# Patient Record
Sex: Male | Born: 1970 | Race: White | Hispanic: No | Smoking: Never smoker
Health system: Southern US, Community
[De-identification: ages and names within clinical notes are randomized; demographics above are authoritative.]

## PROBLEM LIST (undated history)

## (undated) DIAGNOSIS — K8020 Calculus of gallbladder without cholecystitis without obstruction: Secondary | ICD-10-CM

## (undated) DIAGNOSIS — E119 Type 2 diabetes mellitus without complications: Secondary | ICD-10-CM

## (undated) DIAGNOSIS — M25511 Pain in right shoulder: Secondary | ICD-10-CM

## (undated) DIAGNOSIS — Z6841 Body Mass Index (BMI) 40.0 and over, adult: Secondary | ICD-10-CM

## (undated) DIAGNOSIS — K625 Hemorrhage of anus and rectum: Secondary | ICD-10-CM

## (undated) DIAGNOSIS — K760 Fatty (change of) liver, not elsewhere classified: Secondary | ICD-10-CM

## (undated) DIAGNOSIS — K219 Gastro-esophageal reflux disease without esophagitis: Secondary | ICD-10-CM

## (undated) DIAGNOSIS — I2510 Atherosclerotic heart disease of native coronary artery without angina pectoris: Secondary | ICD-10-CM

## (undated) DIAGNOSIS — I10 Essential (primary) hypertension: Secondary | ICD-10-CM

## (undated) DIAGNOSIS — C801 Malignant (primary) neoplasm, unspecified: Secondary | ICD-10-CM

## (undated) DIAGNOSIS — M25512 Pain in left shoulder: Secondary | ICD-10-CM

## (undated) DIAGNOSIS — F1010 Alcohol abuse, uncomplicated: Secondary | ICD-10-CM

## (undated) HISTORY — DX: Essential (primary) hypertension: I10

## (undated) HISTORY — DX: Type 2 diabetes mellitus without complications: E11.9

---

## 2012-07-15 DIAGNOSIS — F1010 Alcohol abuse, uncomplicated: Secondary | ICD-10-CM

## 2012-07-15 HISTORY — DX: Alcohol abuse, uncomplicated: F10.10

## 2013-03-15 DIAGNOSIS — E11628 Type 2 diabetes mellitus with other skin complications: Secondary | ICD-10-CM | POA: Insufficient documentation

## 2013-03-15 DIAGNOSIS — E119 Type 2 diabetes mellitus without complications: Secondary | ICD-10-CM

## 2013-03-15 HISTORY — DX: Type 2 diabetes mellitus without complications: E11.9

## 2013-03-15 HISTORY — DX: Morbid (severe) obesity due to excess calories: E66.01

## 2013-03-20 ENCOUNTER — Ambulatory Visit (INDEPENDENT_AMBULATORY_CARE_PROVIDER_SITE_OTHER): Payer: Self-pay | Admitting: Family Medicine

## 2013-03-20 ENCOUNTER — Emergency Department (HOSPITAL_COMMUNITY)
Admission: EM | Admit: 2013-03-20 | Discharge: 2013-03-20 | Discharge status: Against Medical Advice | Payer: Self-pay | Attending: Emergency Medicine | Admitting: Emergency Medicine

## 2013-03-20 ENCOUNTER — Emergency Department (HOSPITAL_COMMUNITY): Payer: Self-pay

## 2013-03-20 ENCOUNTER — Encounter (HOSPITAL_COMMUNITY): Payer: Self-pay

## 2013-03-20 ENCOUNTER — Encounter: Payer: Self-pay | Admitting: Family Medicine

## 2013-03-20 VITALS — BP 152/98 | HR 109 | Temp 97.9°F | Resp 20

## 2013-03-20 DIAGNOSIS — R61 Generalized hyperhidrosis: Secondary | ICD-10-CM | POA: Insufficient documentation

## 2013-03-20 DIAGNOSIS — R0609 Other forms of dyspnea: Secondary | ICD-10-CM | POA: Insufficient documentation

## 2013-03-20 DIAGNOSIS — R42 Dizziness and giddiness: Secondary | ICD-10-CM | POA: Insufficient documentation

## 2013-03-20 DIAGNOSIS — B354 Tinea corporis: Secondary | ICD-10-CM

## 2013-03-20 DIAGNOSIS — Z79899 Other long term (current) drug therapy: Secondary | ICD-10-CM | POA: Insufficient documentation

## 2013-03-20 DIAGNOSIS — K219 Gastro-esophageal reflux disease without esophagitis: Secondary | ICD-10-CM | POA: Insufficient documentation

## 2013-03-20 DIAGNOSIS — I4892 Unspecified atrial flutter: Secondary | ICD-10-CM

## 2013-03-20 DIAGNOSIS — R000 Tachycardia, unspecified: Secondary | ICD-10-CM | POA: Insufficient documentation

## 2013-03-20 DIAGNOSIS — R0989 Other specified symptoms and signs involving the circulatory and respiratory systems: Secondary | ICD-10-CM | POA: Insufficient documentation

## 2013-03-20 DIAGNOSIS — I10 Essential (primary) hypertension: Secondary | ICD-10-CM

## 2013-03-20 DIAGNOSIS — R079 Chest pain, unspecified: Secondary | ICD-10-CM | POA: Insufficient documentation

## 2013-03-20 HISTORY — DX: Gastro-esophageal reflux disease without esophagitis: K21.9

## 2013-03-20 LAB — CBC WITH DIFFERENTIAL/PLATELET
Basophils Absolute: 0 10*3/uL (ref 0.0–0.1)
Basophils Relative: 0 % (ref 0–1)
Eosinophils Relative: 1 % (ref 0–5)
HCT: 46.6 % (ref 39.0–52.0)
Hemoglobin: 16.4 g/dL (ref 13.0–17.0)
MCH: 32.3 pg (ref 26.0–34.0)
MCHC: 35.2 g/dL (ref 30.0–36.0)
MCV: 91.9 fL (ref 78.0–100.0)
Monocytes Absolute: 0.5 10*3/uL (ref 0.1–1.0)
Monocytes Relative: 6 % (ref 3–12)
Neutro Abs: 6.3 10*3/uL (ref 1.7–7.7)
RDW: 12.9 % (ref 11.5–15.5)

## 2013-03-20 LAB — POCT I-STAT TROPONIN I: Troponin i, poc: 0 ng/mL (ref 0.00–0.08)

## 2013-03-20 LAB — COMPREHENSIVE METABOLIC PANEL
Albumin: 3.8 g/dL (ref 3.5–5.2)
BUN: 8 mg/dL (ref 6–23)
Calcium: 9.3 mg/dL (ref 8.4–10.5)
GFR calc Af Amer: >90 mL/min (ref >90)
Glucose, Bld: 294 mg/dL — ABNORMAL HIGH (ref 70–99)
Total Protein: 7.3 g/dL (ref 6.0–8.3)

## 2013-03-20 LAB — D-DIMER, QUANTITATIVE: D-Dimer, Quant: <0.27 ug/mL-FEU (ref 0.00–0.48)

## 2013-03-20 MED ORDER — SODIUM CHLORIDE 0.9 % IV SOLN
INTRAVENOUS | Status: DC
  Administered 2013-03-20: 11:00:00 via INTRAVENOUS

## 2013-03-20 MED ORDER — KETOCONAZOLE 2 % EX CREA: TOPICAL_CREAM | Freq: Every day | CUTANEOUS | Status: DC

## 2013-03-20 MED ORDER — IOHEXOL 350 MG/ML SOLN
100.0000 mL | Freq: Once | INTRAVENOUS | Status: AC | PRN
  Administered 2013-03-20: 100 mL via INTRAVENOUS

## 2013-03-20 NOTE — ED Notes (Signed)
Pt presented to [facility] with c/o CP associated with diaphoresis.  Pt reports some SOB.  Pt describes pain as intermittent tightness to the left chest.  Pt took 325mg  Aspirin just prior to going to [facility].

## 2013-03-20 NOTE — ED Provider Notes (Addendum)
CSN: [id number]     Arrival date & time 03/20/13  4098 History   First MD Initiated Contact with Patient 03/20/13 [phone]     Chief Complaint  Patient presents with  . Chest Pain   (Consider location/radiation/quality/duration/timing/severity/associated sxs/prior Treatment) Patient is a 42 y.o. male presenting with chest pain. The history is provided by the patient.  Chest Pain  patient here complaining of left-sided sharp chest pain which began at rest this morning. He had associated dizziness and diaphoresis. Mild dyspnea as well 2. No recent fever or cough. No leg pain or swelling. No pleuritic component to this. Went to urgent care Center and was sent here for further evaluation. No prior history of same. Strong family history of coronary artery disease with family members in their 49s having MIs. He was given aspirin prior to arrival. EMS was called and he was transported here  Past Medical History  Diagnosis Date  . GERD (gastroesophageal reflux disease)    History reviewed. No pertinent past surgical history. No family history on file. History  Substance Use Topics  . Smoking status: Never Smoker   . Smokeless tobacco: Not on file     Comment: occasionally dips tobacco  . Alcohol Use: Yes     Comment: Drinks 16-24 ounces of Vodka daily.  Last drink last night.    Review of Systems  Cardiovascular: Positive for chest pain.  All other systems reviewed and are negative.    Allergies  Review of patient's allergies indicates no known allergies.  Home Medications   Current Outpatient Rx  Name  Route  Sig  Dispense  Refill  . ketoconazole (NIZORAL) 2 % cream   Topical   Apply topically daily.   15 g   0   . Multiple Vitamins-Minerals (MULTIVITAMIN WITH MINERALS) tablet   Oral   Take 1 tablet by mouth daily.         Marland Kitchen omeprazole (PRILOSEC) 10 MG capsule   Oral   Take 10 mg by mouth daily.          BP 153/83  Resp 18  Ht 6\' 3"  (1.905 m)  Wt 370 lb (167.831 kg)   BMI 46.25 kg/m2  SpO2 93% Physical Exam  Nursing note and vitals reviewed. Constitutional: He is oriented to person, place, and time. He appears well-developed and well-nourished.  Non-toxic appearance. No distress.  HENT:  Head: Normocephalic and atraumatic.  Eyes: Conjunctivae, EOM and lids are normal. Pupils are equal, round, and reactive to light.  Neck: Normal range of motion. Neck supple. No tracheal deviation present. No mass present.  Cardiovascular: Regular rhythm and normal heart sounds.  Tachycardia present.  Exam reveals no gallop.   No murmur heard. Pulmonary/Chest: Effort normal and breath sounds normal. No stridor. No respiratory distress. He has no decreased breath sounds. He has no wheezes. He has no rhonchi. He has no rales.  Abdominal: Soft. Normal appearance and bowel sounds are normal. He exhibits no distension. There is no tenderness. There is no rebound and no CVA tenderness.  Musculoskeletal: Normal range of motion. He exhibits no edema and no tenderness.  Neurological: He is alert and oriented to person, place, and time. He has normal strength. No cranial nerve deficit or sensory deficit. GCS eye subscore is 4. GCS verbal subscore is 5. GCS motor subscore is 6.  Skin: Skin is warm and dry. No abrasion and no rash noted.  Psychiatric: He has a normal mood and affect. His speech is normal and  behavior is normal.    ED Course  Procedures (including critical care time) Labs Review Labs Reviewed  CBC WITH DIFFERENTIAL  COMPREHENSIVE METABOLIC PANEL   Imaging Review No results found.  MDM  No diagnosis found.  Date: 03/20/2013  Rate: 98  Rhythm: normal sinus rhythm  QRS Axis: normal  Intervals: normal  ST/T Wave abnormalities: nonspecific ST changes  Conduction Disutrbances:left bundle branch block  Narrative Interpretation:   Old EKG Reviewed: none available   11:40 AM Patient given IV fluids here. Troponin and d-dimer negative. Will be admitted for  ACS workup  12:39 PM Patient seen by admitting team and he has refused admission. Risk of sudden cardiac death explained to him and he will sign out AMA  Toy Baker, MD 03/20/13 1140  Toy Baker, MD 03/20/13 1240

## 2013-03-20 NOTE — Progress Notes (Signed)
Is a 42 year old single man who comes in with his father because of chest pain. 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  Patient developed chest pain about 7:30 this morning. He describes it as a fluctuating substernal pressure with some left arm paresthesias. He denies any shortness of breath but he does have diaphoresis. Patient took one 325 mg aspirin. Patient has positive family history for atrial flutter.  Objective: Patient is in no acute distress but he is mildly diaphoretic and obviously concerned. HEENT: Unremarkable Neck: Supple no adenopathy or JVD, no thyromegaly Chest: Clear to auscultation Heart: Rapid at about 110 beats per minute, no murmur, no gallop Abdomen: Soft nontender Extremities: No tenderness of the calves, 1-2+ pedal edema present Skin: Multiple annular abdominal erythematous lesions with central clearing and sharp border on the diameter. EKG: Atrial flutter without acute ST changes. Assessment: Hypertensive, morbidly obese individual with new-onset of atrial flutter requiring further investigation for cardiac disease.  Plan: IV to KVO, oxygen. Ambulance called and transfer him and to: Hospital.  Signed, Sheila Oats.D.

## 2013-03-20 NOTE — ED Notes (Signed)
Internal Medicine at bedside.  

## 2013-03-20 NOTE — ED Notes (Signed)
Pt has ride home with family. Educated on risks of leaving AMA

## 2013-03-20 NOTE — Patient Instructions (Addendum)
s a 42 year old single man who comes in with his father because of chest pain. 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  Patient developed chest pain about 7:30 this morning. He describes it as a fluctuating substernal pressure with some left arm paresthesias. He denies any shortness of breath but he does have diaphoresis. Patient took one 325 mg aspirin. Patient has positive family history for atrial flutter.  Objective: Patient is in no acute distress but he is mildly diaphoretic and obviously concerned. HEENT: Unremarkable Neck: Supple no adenopathy or JVD, no thyromegaly Chest: Clear to auscultation Heart: Rapid at about 110 beats per minute, no murmur, no gallop Abdomen: Soft nontender Extremities: No tenderness of the calves, 1-2+ pedal edema present Skin: Multiple annular abdominal erythematous lesions with central clearing and sharp border on the diameter. EKG: Atrial flutter without acute ST changes. Assessment: Hypertensive, morbidly obese individual with new-onset of atrial flutter requiring further investigation for cardiac disease.  Plan: IV to KVO, oxygen. Ambulance called and transfer him and to: Hospital.  Signed, Sheila Oats.D.

## 2013-03-20 NOTE — ED Notes (Signed)
Pt refuses to use urinal and insists on walking to restroom.  Pt ambulated independently and without difficulty to restroom.  Pt updated on status and advised that Internal Med had been consulted.  Pt stated he wants to go home.

## 2013-03-20 NOTE — ED Notes (Signed)
Pt given another pillow. 

## 2013-03-20 NOTE — ED Notes (Signed)
Pt has returned from being out of the department; pt placed back on monitor, continuous pulse oximetry and blood pressure cuff 

## 2013-03-20 NOTE — H&P (Signed)
Date: 03/20/2013               Patient Name:  Chad [name] MRN: [id number]  DOB: 1970-08-16 Age / Sex: 42 y.o., male   PCP: No Pcp Per Patient         Medical Service: Internal Medicine Teaching Service         Attending Physician: Dr. Toy Baker, MD    First Contact: Dr. Delane Ginger Pager: [phone]  Second Contact: Dr. Dorise Hiss Pager: [phone]       After Hours (After 5p/  First Contact Pager: [phone]  weekends / holidays): Second Contact Pager: [phone]   Chief Complaint: Chest Pain  History of Present Illness: Chad [name] is a 42 yo male with a PMH of GERD.  He does not have a PCP and has not seen a physician in some time.  He presents to the ED with sudden onset of non-radiating, non-positional left-sided chest pain that began this am.  He describes the pain as sharp, well-localized, beneath the left breast area lasting a few seconds at a time.  He endorses associated N/V, diaphoresis, SOB, and dizziness.  He reports occasional headaches and associates it with blood pressure elevation.  He reports taking prilosec everyday for GERD and denies any recent symptoms.  He denies any previous episodes of CP.  No history of recent travel or trauma. He denies smoking or any recreational drug use, but reports drinking 3-4 drinks per evening for some time.  His father and several uncles have a history of CAD in their early sixties.   We met with him in the ED and examined him, but he desires to leave and states he will return if he feels worse.  He was informed of the risks of leaving but decided to go.   Meds: Current Facility-Administered Medications  Medication Dose Route Frequency Provider Last Rate Last Dose  . 0.9 %  sodium chloride infusion   Intravenous Continuous Toy Baker, MD       Current Outpatient Prescriptions  Medication Sig Dispense Refill  . Ascorbic Acid (VITAMIN C PO) Take 1 tablet by mouth daily.      Marland Kitchen BIOTIN PO Take 1 tablet by mouth daily.      . ferrous sulfate  325 (65 FE) MG tablet Take 325 mg by mouth daily with breakfast.      . fish oil-omega-3 fatty acids 1000 MG capsule Take 2 g by mouth daily.      . Glucosamine HCl (GLUCOSAMINE PO) Take 1 tablet by mouth daily.      Marland Kitchen ibuprofen (ADVIL,MOTRIN) 200 MG tablet Take 400 mg by mouth daily.      . Multiple Vitamins-Minerals (ZINC PO) Take 1 tablet by mouth daily.      Marland Kitchen omeprazole (PRILOSEC OTC) 20 MG tablet Take 20 mg by mouth daily.      . vitamin B-12 (CYANOCOBALAMIN) 250 MCG tablet Take 250 mcg by mouth daily.      Marland Kitchen VITAMIN E PO Take 1 capsule by mouth daily.      Marland Kitchen ketoconazole (NIZORAL) 2 % cream Apply topically daily.  15 g  0    Allergies: Allergies as of 03/20/2013  . (No Known Allergies)   Past Medical History  Diagnosis Date  . GERD (gastroesophageal reflux disease)    History reviewed. No pertinent past surgical history. No family history on file. History   Social History  . Marital Status: Single    Spouse Name: N/A  Number of Children: N/A  . Years of Education: N/A   Occupational History  . Not on file.   Social History Main Topics  . Smoking status: Never Smoker   . Smokeless tobacco: Not on file     Comment: occasionally dips tobacco  . Alcohol Use: Yes     Comment: Drinks 16-24 ounces of Vodka daily.  Last drink last night.  . Drug Use: No  . Sexual Activity: Not on file   Other Topics Concern  . Not on file   Social History Narrative  . No narrative on file    Review of Systems: Pertinent items are noted in HPI.  Physical Exam: Blood pressure 157/86, pulse 97, resp. rate 18, height 6\' 3"  (1.905 m), weight 167.831 kg (370 lb), SpO2 95.00%.  Physical Exam  Constitutional: He is oriented to person, place, and time and well-developed, well-nourished, and in no distress.  HENT:  Head: Normocephalic and atraumatic.  Eyes: Conjunctivae and EOM are normal. Pupils are equal, round, and reactive to light.  Neck: Neck supple.  Cardiovascular: Regular  rhythm, normal heart sounds and intact distal pulses.  Tachycardia present.   Pulmonary/Chest: Effort normal and breath sounds normal. No respiratory distress. He exhibits no tenderness.  Abdominal: Soft. Bowel sounds are normal.  Obese.   Neurological: He is alert and oriented to person, place, and time.  Skin: Skin is warm and dry.    Lab results: Basic Metabolic Panel:  Recent Labs  16/10/96 1000  NA 134*  K 4.3  CL 98  CO2 22  GLUCOSE 294*  BUN 8  CREATININE 0.51  CALCIUM 9.3   Liver Function Tests:  Recent Labs  03/20/13 1000  AST 36  ALT 52  ALKPHOS 50  BILITOT 0.5  PROT 7.3  ALBUMIN 3.8   CBC:  Recent Labs  03/20/13 1000  WBC 8.4  NEUTROABS 6.3  HGB 16.4  HCT 46.6  MCV 91.9  PLT 208    Cardiac Enzymes:  Troponin (Point of Care Test)  Recent Labs  03/20/13 1004  TROPIPOC 0.00   D-Dimer:  Recent Labs  03/20/13 1000  DDIMER <0.27   ED ECG REPORT   Date: 03/20/2013  EKG Time: 1:15 PM  Rate: 98  Rhythm: normal sinus rhythm  Axis: Left Axis Deviation  Intervals:left bundle branch block  ST&T Change: No old ECG for comparison  Imaging results:  Dg Chest 2 View  03/20/2013   *RADIOLOGY REPORT*  Clinical Data: Left-sided chest pain.  CHEST - 2 VIEW  Comparison: None.  Findings: Lungs are adequately inflated with minimal prominence of the right perihilar markings with subtle patchy density which may reflect minimal asymmetric vascular congestion versus viral bronchopneumonia or possible early bacterial pneumonia. Cardiomediastinal silhouette is within normal.  There is mild degenerate changes of the spine.  IMPRESSION: Mild prominence of the right perihilar markings with subtle patchy density as findings may be due to a viral bronchopneumonia and less likely early bacterial pneumonia or asymmetric vascular congestion.   Original Report Authenticated By: Elberta Fortis, M.D.   Ct Angio Chest Pe W/cm &/or Wo Cm  03/20/2013   *RADIOLOGY REPORT*   Clinical Data: Chest pain.  Diaphoresis.  Shortness of breath.  CT ANGIOGRAPHY CHEST  Technique:  Multidetector CT imaging of the chest using the standard protocol during bolus administration of intravenous contrast. Multiplanar reconstructed images including MIPs were obtained and reviewed to evaluate the vascular anatomy.  Contrast: OMNIPAQUE IOHEXOL 350 MG/ML SOLN  Comparison: No  priors.  Findings:  Mediastinum: There are no filling defects within the pulmonary arterial tree to suggest underlying pulmonary embolism. Heart size is mildly enlarged with left ventricular dilatation. There is no significant pericardial fluid, thickening or pericardial calcification. There is atherosclerosis of the thoracic aorta, the great vessels of the mediastinum and the coronary arteries, including calcified atherosclerotic plaque in the left anterior descending and right coronary arteries. No pathologically enlarged mediastinal or hilar lymph nodes. Esophagus is unremarkable in appearance.  Lungs/Pleura: Patchy areas of ground-glass attenuation with some mild diffuse interlobular septal thickening, favored to reflect mild interstitial pulmonary edema.  There is also evidence of air trapping throughout the lungs bilaterally, suggesting small airways disease.  No frank consolidative airspace disease.  No pleural effusions. No definite suspicious appearing pulmonary nodules or masses.  Upper Abdomen: Diffuse low attenuation throughout the hepatic parenchyma, compatible with hepatic steatosis.  Musculoskeletal: There are no aggressive appearing lytic or blastic lesions noted in the visualized portions of the skeleton.  IMPRESSION: 1.  No evidence of pulmonary embolism. 2.  Cardiomegaly with evidence of mild interstitial pulmonary edema; findings compatible with mild congestive heart failure. 3.  Air trapping the lungs bilaterally, suggesting small airways disease. 4.  Hepatic steatosis.   Original Report Authenticated By:  Trudie Reed, M.D.    Assessment & Plan by Problem:  Mr. Furukawa is a 42 yo male with a PMH of GERD who presents to the ED with left-sided chest pain.  1. Atypical Chest Pain: Pt presents with sudden onset of non-radiating, non-positional left-sided chest pain that began this am.  He describes the pain as sharp, well-localized, beneath the left breast area lasting a few seconds at a time.  He endorses associated N/V, diaphoresis, and dizziness.  Possibilities include: ACS, PE, aortic dissection, GERD.  ACS is possible given pt symptoms and +FH and should be ruled out.  TIMI score is 1 so 5% risk at 14 days of: all-cause mortality, new or recurrent MI, or severe recurrent ischemia requiring urgent revascularization.  Additionally, a LBBB was noted on ECG but there were no old ECGs for comparison.  PE is unlikely given a negative D-dimer and negative CTA.  Aortic dissection is also unlikely given no radiation of CP to the back and no abnormalities on CXR.    -Pt decided to leave AMA.  He was informed of the risks of going home and voiced understanding of these risks.  We strongly encouraged him to visit a physician ASAP as an outpatient and gave him the contact information for our clinic.    2. Impaired Glucose Tolerance: Pt glucose was elevated in the ED at 294.  -pt needs HA1C, UA -also needs LP, TSH  Dispo: Patient decided to leave AMA.  The patient does not have a current PCP (No Pcp Per Patient) and does need a hospital follow-up.  Signed: Boykin Peek, MD 03/20/2013, 1:15 PM

## 2013-03-20 NOTE — Addendum Note (Signed)
Addended by: Cydney Ok on: 03/20/2013 09:36 AM   Modules accepted: Orders

## 2013-03-21 NOTE — H&P (Signed)
I saw and evaluated the patient. I personally confirmed the key portions of Dr. Shiela Mayer history and exam and reviewed pertinent patient test results. The assessment, diagnosis, and plan were reviewed with the housestaff and I agree with the documentation in the resident's note.  I also tried to explain to Chad [name] the risks of leaving against medical advice, and although he understood, decided to leave anyway.  I also stressed the importance of establishing with a Primary Care Provider given his diagnosis of diabetes (random glucose > 200, polyuria, polydipsia, and blurred vision) and this chest pain.  I gave him the number of our clinic should he want to establish with [place].

## 2013-03-23 ENCOUNTER — Ambulatory Visit: Payer: Self-pay | Admitting: Family Medicine

## 2013-03-23 VITALS — BP 146/94 | HR 84 | Temp 98.4°F | Resp 20 | Ht 74.0 in | Wt >= 6400 oz

## 2013-03-23 DIAGNOSIS — E119 Type 2 diabetes mellitus without complications: Secondary | ICD-10-CM

## 2013-03-23 DIAGNOSIS — I10 Essential (primary) hypertension: Secondary | ICD-10-CM

## 2013-03-23 LAB — BASIC METABOLIC PANEL WITH GFR
BUN: 11 mg/dL (ref 6–23)
Chloride: 101 meq/L (ref 96–112)
Glucose, Bld: 243 mg/dL — ABNORMAL HIGH (ref 70–99)
Potassium: 4.2 meq/L (ref 3.5–5.3)

## 2013-03-23 LAB — POCT URINALYSIS DIPSTICK
Bilirubin, UA: NEGATIVE
Blood, UA: NEGATIVE
Glucose, UA: 500
Leukocytes, UA: NEGATIVE
Nitrite, UA: NEGATIVE
Protein, UA: 30
Spec Grav, UA: >=1.03
Urobilinogen, UA: 1
pH, UA: 6

## 2013-03-23 LAB — BASIC METABOLIC PANEL
CO2: 27 mEq/L (ref 19–32)
Calcium: 9.1 mg/dL (ref 8.4–10.5)
Creat: 0.68 mg/dL (ref 0.50–1.35)
Sodium: 138 mEq/L (ref 135–145)

## 2013-03-23 LAB — POCT GLYCOSYLATED HEMOGLOBIN (HGB A1C): Hemoglobin A1C: 9

## 2013-03-23 MED ORDER — METFORMIN HCL 500 MG PO TABS: 1000.0000 mg | ORAL_TABLET | Freq: Two times a day (BID) | ORAL | Status: DC

## 2013-03-23 MED ORDER — LISINOPRIL 5 MG PO TABS: 5.0000 mg | ORAL_TABLET | Freq: Every day | ORAL | Status: DC

## 2013-03-23 NOTE — Progress Notes (Signed)
[facility]:  Office Visit  Chief Complaint:  Chief Complaint  Patient presents with  . Diabetes    need a referral for Endocrinology    HPI: Chad [name] is a 42 y.o. male who complains of here for recheck and was not found to have cardiac related CP in ER on 03/20/2013.  He  was told to get tested for diabetes since his sugars were highly elevated. Marland Kitchen  His dad is diabetic.  He has been more thirsty and also urinating Has had numbness and tingling in feet at night intemittently, this started a coupl eof months ago.  He took his fasting sugar and it was mid to high 200s.  He is a drinker, he drinks daily. Since this has happened he stopped drinking 3-4 days ago.  He has not had a drink sine Saturday, he drinks nightly 16 -20 oz, he does not want to feel this way again.  He has had minimal  nausea, dizziness. He denies every having blackouts, seizures, DTs. He denies being an alcoholic but acknowledges he can drink a significant amount of etoh   Past Medical History  Diagnosis Date  . GERD (gastroesophageal reflux disease)    History reviewed. No pertinent past surgical history. History   Social History  . Marital Status: Single    Spouse Name: N/A    Number of Children: N/A  . Years of Education: N/A   Social History Main Topics  . Smoking status: Never Smoker   . Smokeless tobacco: None     Comment: occasionally dips tobacco  . Alcohol Use: Yes     Comment: Drinks 16-24 ounces of Vodka daily.  Last drink last night.  . Drug Use: No  . Sexual Activity: None   Other Topics Concern  . None   Social History Narrative  . None   Family History  Problem Relation Age of Onset  . Diabetes Father    No Known Allergies Prior to Admission medications   Medication Sig Start Date End Date Taking? Authorizing Provider  Ascorbic Acid (VITAMIN C PO) Take 1 tablet by mouth daily.   Yes Historical Provider, MD  BIOTIN PO Take 1 tablet by mouth daily.   Yes  Historical Provider, MD  ferrous sulfate 325 (65 FE) MG tablet Take 325 mg by mouth daily with breakfast.   Yes Historical Provider, MD  fish oil-omega-3 fatty acids 1000 MG capsule Take 2 g by mouth daily.   Yes Historical Provider, MD  Glucosamine HCl (GLUCOSAMINE PO) Take 1 tablet by mouth daily.   Yes Historical Provider, MD  ibuprofen (ADVIL,MOTRIN) 200 MG tablet Take 400 mg by mouth daily.   Yes Historical Provider, MD  omeprazole (PRILOSEC OTC) 20 MG tablet Take 20 mg by mouth daily.   Yes Historical Provider, MD  vitamin B-12 (CYANOCOBALAMIN) 250 MCG tablet Take 250 mcg by mouth daily.   Yes Historical Provider, MD  VITAMIN E PO Take 1 capsule by mouth daily.   Yes Historical Provider, MD  ketoconazole (NIZORAL) 2 % cream Apply topically daily. 03/20/13   Elvina Sidle, MD  Multiple Vitamins-Minerals (ZINC PO) Take 1 tablet by mouth daily.    Historical Provider, MD     ROS: The patient denies fevers, chills, night sweats, unintentional weight loss, chest pain, wheezing, dyspnea on exertion, vomiting, abdominal pain, dysuria, hematuria, melena, weakness.  All other systems have been reviewed and were otherwise negative with the exception of those mentioned in the HPI and as  above.    PHYSICAL EXAM: Filed Vitals:   03/23/13 0756  BP: 146/94  Pulse: 84  Temp: 98.4 F (36.9 C)  Resp: 20   Filed Vitals:   03/23/13 0756  Height: 6\' 2"  (1.88 m)  Weight: 427 lb 12.8 oz (194.049 kg)   Body mass index is 54.9 kg/(m^2).  General: Alert, no acute distress, obese male HEENT:  Normocephalic, atraumatic, oropharynx patent. EOMI, PERRLA Cardiovascular:  Regular rate and rhythm, no rubs murmurs or gallops.  No Carotid bruits, radial pulse intact. No pedal edema.  Respiratory: Clear to auscultation bilaterally.  No wheezes, rales, or rhonchi.  No cyanosis, no use of accessory musculature GI: No organomegaly, abdomen is soft and non-tender, positive bowel sounds.  No masses. Skin: No  rashes. Neurologic: Facial musculature symmetric. Microfilament exam nl Psychiatric: Patient is appropriate throughout our interaction. Lymphatic: No cervical lymphadenopathy Musculoskeletal: Gait intact.   LABS: Results for orders placed in visit on 03/23/13  POCT GLYCOSYLATED HEMOGLOBIN (HGB A1C)      Result Value Range   Hemoglobin A1C 9.0    POCT URINALYSIS DIPSTICK      Result Value Range   Color, UA amber     Clarity, UA clear     Glucose, UA 500     Bilirubin, UA neg     Ketones, UA trace     Spec Grav, UA >=1.030     Blood, UA neg     pH, UA 6.0     Protein, UA 30     Urobilinogen, UA 1.0     Nitrite, UA neg     Leukocytes, UA Negative        EKG/XRAY:   Primary read interpreted by Dr. Conley Rolls at [facility].   ASSESSMENT/PLAN: Encounter Diagnoses  Name Primary?  . Diabetes Yes  . HTN (hypertension)    Rx Metformin Rx low dose Lisinopril due to proteinuria Repeat BMP pending for low Na and he has been pushing a lot of water due to thirst and wanting to feel better  He will f/u in 1 month to see if sugars have improved, if he desires he can measure fasting sugars in the AM 1-2 times a week to see if sugars have improved but not necessary Advise to stop drinking since alcohol contributing to elevated sugars Gross sideeffects, risk and benefits, and alternatives of medications d/w patient. Patient is aware that all medications have potential sideeffects and we are unable to predict every sideeffect or drug-drug interaction that may occur.  Ebbie Cherry PHUONG, DO 03/23/2013 9:29 AM

## 2013-03-23 NOTE — Patient Instructions (Signed)
Diabetes and Small Vessel Disease Small vessel disease (microvascular disease) includes nephropathy, retinopathy, and neuropathy. People with diabetes are at risk for these problems, but keeping blood glucose (sugar) controlled is helpful in preventing problems. DIABETIC KIDNEY PROBLEMS (DIABETIC NEPHROPATHY)  Diabetic nephropathy occurs in many patients with diabetes.  Damage to the small vessels in the kidneys is the leading cause of end-stage renal disease (ESRD).  Protein in the urine (albuminuria) in the range of 30 to 300 mg/24 h (microalbuminuria) is a sign of the earliest stage of diabetic nephropathy.  Good blood glucose (sugar) and blood pressure control significantly reduce the progression of nephropathy. DIABETIC EYE PROBLEMS (DIABETIC RETINOPATHY)  Diabetic retinopathy is the most common cause of new cases of blindness in adults. It is related to the number of years you have had diabetes.  Common risk factors include high blood sugar (hyperglycemia), high blood pressure (hypertension), and poorly controlled blood lipids such as high blood cholesterol (hypercholesterolemia). DIABETIC NERVE PROBLEMS (DIABETIC NEUROPATHY) Diabetic neuropathy is the most common, long-term complication of diabetes. It is responsible for more than half of leg amputations not due to accidents. The main risk for developing diabetic neuropathy seems to be uncontrolled blood sugars. Hyperglycemia damages the nerve fibers causing sensation (feeling) problems. The closer you can keep the following guidelines, the better chance you will have avoiding problems from small vessel disease.  Working toward near normal blood glucose or as normal as possible. You will need to keep your blood glucose and A1c at the target range prescribed by your caregiver.  Keep your blood pressure less than 120/80.  Keep your low-density lipoprotein (LDL) cholesterol (one of the fats in your blood) at less than 100 mg/dL. An LDL  less than 70 mg/dL may be recommended for high risk patients. You cannot change your family history, but it is important to change the risk factors that you can. Risk factors you can control include:  Controlling high blood pressure.  Stopping smoking.  Using alcohol only in moderation. Generally, this means about one drink per day for women and two drinks per day for men.  Controlling your blood lipids (cholesterol and triglycerides).  Treating heart problems, if these are contributing to risk. SEEK MEDICAL CARE IF:   You are having problems keeping your blood glucose in goal range.  You notice a change in your vision or new problems with your vision.  You have wound or sore that does not heal.  Your blood pressure is above the target range. Document Released: 07/04/2003 Document Revised: 06/17/2012 Document Reviewed: 12/09/2008 [facility] Patient Information 2014 [place], [place]. Diabetes and Exercise Regular exercise is important and can help:   Control blood glucose (sugar).  Decrease blood pressure.    Control blood lipids (cholesterol, triglycerides).  Improve overall health. BENEFITS FROM EXERCISE  Improved fitness.  Improved flexibility.  Improved endurance.  Increased bone density.  Weight control.  Increased muscle strength.  Decreased body fat.  Improvement of the body's use of insulin, a hormone.  Increased insulin sensitivity.  Reduction of insulin needs.  Reduced stress and tension.  Helps you feel better. People with diabetes who add exercise to their lifestyle gain additional benefits, including:  Weight loss.  Reduced appetite.  Improvement of the body's use of blood glucose.  Decreased risk factors for heart disease:  Lowering of cholesterol and triglycerides.  Raising the level of good cholesterol (high-density lipoproteins, HDL).  Lowering blood sugar.  Decreased blood pressure. TYPE 1 DIABETES AND EXERCISE  Exercise  will  usually lower your blood glucose.  If blood glucose is greater than 240 mg/dl, check urine ketones. If ketones are present, do not exercise.  Location of the insulin injection sites may need to be adjusted with exercise. Avoid injecting insulin into areas of the body that will be exercised. For example, avoid injecting insulin into:  The arms when playing tennis.  The legs when jogging. For more information, discuss this with your caregiver.  Keep a record of:  Food intake.  Type and amount of exercise.  Expected peak times of insulin action.  Blood glucose levels. Do this before, during, and after exercise. Review your records with your caregiver. This will help you to develop guidelines for adjusting food intake and insulin amounts.  TYPE 2 DIABETES AND EXERCISE  Regular physical activity can help control blood glucose.  Exercise is important because it may:  Increase the body's sensitivity to insulin.  Improve blood glucose control.  Exercise reduces the risk of heart disease. It decreases serum cholesterol and triglycerides. It also lowers blood pressure.  Those who take insulin or oral hypoglycemic agents should watch for signs of hypoglycemia. These signs include dizziness, shaking, sweating, chills, and confusion.  Body water is lost during exercise. It must be replaced. This will help to avoid loss of body fluids (dehydration) or heat stroke. Be sure to talk to your caregiver before starting an exercise program to make sure it is safe for you. Remember, any activity is better than none.  Document Released: 09/21/2003 Document Revised: 09/23/2011 Document Reviewed: 01/05/2009 [facility] Patient Information 2014 [place], [place].

## 2013-03-25 ENCOUNTER — Encounter: Payer: Self-pay | Admitting: Family Medicine

## 2013-04-20 ENCOUNTER — Ambulatory Visit: Payer: Self-pay | Admitting: *Deleted

## 2013-05-20 ENCOUNTER — Other Ambulatory Visit: Payer: Self-pay

## 2013-05-24 ENCOUNTER — Other Ambulatory Visit: Payer: Self-pay | Admitting: Family Medicine

## 2013-05-25 ENCOUNTER — Ambulatory Visit: Payer: Self-pay | Admitting: *Deleted

## 2013-11-09 ENCOUNTER — Other Ambulatory Visit: Payer: Self-pay | Admitting: Family Medicine

## 2013-12-09 ENCOUNTER — Other Ambulatory Visit: Payer: Self-pay | Admitting: Family Medicine

## 2014-03-01 ENCOUNTER — Ambulatory Visit (INDEPENDENT_AMBULATORY_CARE_PROVIDER_SITE_OTHER): Payer: Self-pay | Admitting: Family Medicine

## 2014-03-01 VITALS — BP 152/90 | HR 105 | Temp 98.4°F | Resp 18 | Ht 75.0 in | Wt >= 6400 oz

## 2014-03-01 DIAGNOSIS — E1165 Type 2 diabetes mellitus with hyperglycemia: Principal | ICD-10-CM

## 2014-03-01 DIAGNOSIS — IMO0001 Reserved for inherently not codable concepts without codable children: Secondary | ICD-10-CM

## 2014-03-01 DIAGNOSIS — I10 Essential (primary) hypertension: Secondary | ICD-10-CM

## 2014-03-01 DIAGNOSIS — Z79899 Other long term (current) drug therapy: Secondary | ICD-10-CM

## 2014-03-01 LAB — LIPID PANEL
CHOL/HDL RATIO: 4.6 ratio
Cholesterol: 204 mg/dL — ABNORMAL HIGH (ref 0–200)
HDL: 44 mg/dL (ref >39)
TRIGLYCERIDES: 511 mg/dL — AB (ref <150)

## 2014-03-01 LAB — COMPREHENSIVE METABOLIC PANEL
ALBUMIN: 4.5 g/dL (ref 3.5–5.2)
ALK PHOS: 49 U/L (ref 39–117)
ALT: 48 U/L (ref 0–53)
AST: 28 U/L (ref 0–37)
BILIRUBIN TOTAL: 0.7 mg/dL (ref 0.2–1.2)
BUN: 10 mg/dL (ref 6–23)
CO2: 28 mEq/L (ref 19–32)
Calcium: 9.3 mg/dL (ref 8.4–10.5)
Chloride: 98 mEq/L (ref 96–112)
Creat: 0.77 mg/dL (ref 0.50–1.35)
Glucose, Bld: 181 mg/dL — ABNORMAL HIGH (ref 70–99)
POTASSIUM: 4 meq/L (ref 3.5–5.3)
Sodium: 137 mEq/L (ref 135–145)
Total Protein: 7.6 g/dL (ref 6.0–8.3)

## 2014-03-01 LAB — POCT CBC
GRANULOCYTE PERCENT: 67.9 % (ref 37–80)
HCT, POC: 50.2 % (ref 43.5–53.7)
Hemoglobin: 16.8 g/dL (ref 14.1–18.1)
Lymph, poc: 3.8 — AB (ref 0.6–3.4)
MCH: 31.9 pg — AB (ref 27–31.2)
MCHC: 33.4 g/dL (ref 31.8–35.4)
MCV: 95.5 fL (ref 80–97)
MID (CBC): 0.7 (ref 0–0.9)
MPV: 8.2 fL (ref 0–99.8)
PLATELET COUNT, POC: 274 10*3/uL (ref 142–424)
POC Granulocyte: 9.4 — AB (ref 2–6.9)
POC LYMPH %: 27.3 % (ref 10–50)
POC MID %: 4.8 % (ref 0–12)
RBC: 5.26 M/uL (ref 4.69–6.13)
RDW, POC: 13.1 %
WBC: 13.9 10*3/uL — AB (ref 4.6–10.2)

## 2014-03-01 LAB — POCT GLYCOSYLATED HEMOGLOBIN (HGB A1C): Hemoglobin A1C: 7.7

## 2014-03-01 MED ORDER — LISINOPRIL 20 MG PO TABS: 20.0000 mg | ORAL_TABLET | Freq: Every day | ORAL | Status: DC

## 2014-03-01 MED ORDER — ASPIRIN EC 81 MG PO TBEC: 81.0000 mg | DELAYED_RELEASE_TABLET | Freq: Every day | ORAL | Status: DC

## 2014-03-01 MED ORDER — METFORMIN HCL 1000 MG PO TABS: 1000.0000 mg | ORAL_TABLET | Freq: Two times a day (BID) | ORAL | Status: DC

## 2014-03-01 MED ORDER — GLIPIZIDE ER 2.5 MG PO TB24
2.5000 mg | ORAL_TABLET | Freq: Every day | ORAL | Status: DC
Start: 2014-03-01 — End: 2014-08-11

## 2014-03-01 NOTE — Progress Notes (Signed)
Subjective:    Patient ID: Chad [name], male    DOB: 1970/08/28, 43 y.o.   MRN: [id number] Chief Complaint  Patient presents with  . rx refills    metformin and lisinopril   HPI  Pt has been out of his lisinopril for over a month - checked BP occ outside office while on it and no prob tolerating it but thinks BP was still a little high - has some white coat HTN but thinks BP was still slightly elev outside office.  Ran out of his metformin >1 mo ago but father on the exact same dose so taking his - still taking 2 tabs twice a day. No other PCP - comes here. Has trouble getting in as he drives out of town for work.  Was taking iron prev but doesn't know why and wasn't tolerating it very well - just made him feel bad.  Noticing he needs reading glasses now, occ blurred vision for many years, not worsening, not been to see an eye doctor and no plans on changing that due to cost.  Past Medical History  Diagnosis Date  . GERD (gastroesophageal reflux disease)   . Hypertension   . Diabetes mellitus without complication    Current Outpatient Prescriptions on File Prior to Visit  Medication Sig Dispense Refill  . ibuprofen (ADVIL,MOTRIN) 200 MG tablet Take 400 mg by mouth daily.      . Multiple Vitamins-Minerals (ZINC PO) Take 1 tablet by mouth daily.      Marland Kitchen omeprazole (PRILOSEC OTC) 20 MG tablet Take 20 mg by mouth daily.      . vitamin B-12 (CYANOCOBALAMIN) 250 MCG tablet Take 250 mcg by mouth daily.      Marland Kitchen VITAMIN E PO Take 1 capsule by mouth daily.       No current facility-administered medications on file prior to visit.   No Known Allergies   Review of Systems  Constitutional: Negative for fever, chills, activity change, appetite change and unexpected weight change.  Eyes: Positive for visual disturbance. Negative for photophobia and pain.  Respiratory: Negative for shortness of breath.   Cardiovascular: Negative for chest pain and leg swelling.  Endocrine: Negative for  polydipsia, polyphagia and polyuria.  Neurological: Negative for dizziness, syncope, facial asymmetry, weakness, light-headedness, numbness and headaches.      BP 152/90  Pulse 105  Temp(Src) 98.4 F (36.9 C) (Oral)  Resp 18  Ht 6\' 3"  (1.905 m)  Wt 428 lb (194.14 kg)  BMI 53.50 kg/m2  SpO2 98% Objective:   Physical Exam  Constitutional: He is oriented to person, place, and time. He appears well-developed and well-nourished. No distress.  HENT:  Head: Normocephalic and atraumatic.  Eyes: Conjunctivae are normal. Pupils are equal, round, and reactive to light. No scleral icterus.  Neck: Normal range of motion. Neck supple. No thyromegaly present.  Cardiovascular: Normal rate, regular rhythm, normal heart sounds and intact distal pulses.   Pulmonary/Chest: Effort normal and breath sounds normal. No respiratory distress.  Musculoskeletal: He exhibits no edema.  Lymphadenopathy:    He has no cervical adenopathy.  Neurological: He is alert and oriented to person, place, and time.  Negative monofil exam per Romie Minus CMA  Skin: Skin is warm and dry. He is not diaphoretic.  Psychiatric: He has a normal mood and affect. His behavior is normal.      Results for orders placed in visit on 03/01/14  POCT CBC      Result Value Ref Range  WBC 13.9 (*) 4.6 - 10.2 K/uL   Lymph, poc 3.8 (*) 0.6 - 3.4   POC LYMPH PERCENT 27.3  10 - 50 %L   MID (cbc) 0.7  0 - 0.9   POC MID % 4.8  0 - 12 %M   POC Granulocyte 9.4 (*) 2 - 6.9   Granulocyte percent 67.9  37 - 80 %G   RBC 5.26  4.69 - 6.13 M/uL   Hemoglobin 16.8  14.1 - 18.1 g/dL   HCT, POC 50.2  43.5 - 53.7 %   MCV 95.5  80 - 97 fL   MCH, POC 31.9 (*) 27 - 31.2 pg   MCHC 33.4  31.8 - 35.4 g/dL   RDW, POC 13.1     Platelet Count, POC 274  142 - 424 K/uL   MPV 8.2  0 - 99.8 fL  POCT GLYCOSYLATED HEMOGLOBIN (HGB A1C)      Result Value Ref Range   Hemoglobin A1C 7.7     Assessment & Plan:   Type II or unspecified type diabetes mellitus  without mention of complication, uncontrolled - Plan: POCT glycosylated hemoglobin (Hb A1C) - cont metformin 1000 bid - tolerating well, start glucotrol xl 2.5 qd. Recheck in 6 mos.  Start asa 81mg  qd, pt declines optho due to cost, nml monofil today.  Essential hypertension, benign - Plan: POCT CBC, Comprehensive metabolic panel, Lipid panel - increase lisinopril from 5 to 20 for improved control.  Encounter for long-term (current) use of other medications - Plan: CANCELED: Microalbumin/Creatinine Ratio, Urine - pt self-pay so will forgo urine microalb as already on acei.  Will need repeat bmp at f/u due to increasing lisinopril dose.  Pt was on iron supp prev so checked cbc to ensure he didn't still need.  Meds ordered this encounter  Medications  . lisinopril (PRINIVIL,ZESTRIL) 20 MG tablet    Sig: Take 1 tablet (20 mg total) by mouth daily.    Dispense:  90 tablet    Refill:  1  . metFORMIN (GLUCOPHAGE) 1000 MG tablet    Sig: Take 1 tablet (1,000 mg total) by mouth 2 (two) times daily with a meal.    Dispense:  180 tablet    Refill:  1  . glipiZIDE (GLUCOTROL XL) 2.5 MG 24 hr tablet    Sig: Take 1 tablet (2.5 mg total) by mouth daily with breakfast.    Dispense:  90 tablet    Refill:  1  . aspirin EC 81 MG tablet    Sig: Take 1 tablet (81 mg total) by mouth daily.     Delman Cheadle, MD MPH

## 2014-03-01 NOTE — Patient Instructions (Signed)
Diabetes and Standards of Medical Care Diabetes is complicated. You may find that your diabetes team includes a dietitian, nurse, diabetes educator, eye doctor, and more. To help everyone know what is going on and to help you get the care you deserve, the following schedule of care was developed to help keep you on track. Below are the tests, exams, vaccines, medicines, education, and plans you will need. HbA1c test This test shows how well you have controlled your glucose over the past 2-3 months. It is used to see if your diabetes management plan needs to be adjusted.   It is performed at least 2 times a year if you are meeting treatment goals.  It is performed 4 times a year if therapy has changed or if you are not meeting treatment goals. Blood pressure test  This test is performed at every routine medical visit. The goal is less than 140/90 mm Hg for most people, but 130/80 mm Hg in some cases. Ask your health care provider about your goal. Dental exam  Follow up with the dentist regularly. Eye exam  If you are diagnosed with type 1 diabetes as a child, get an exam upon reaching the age of 37 years or older and have had diabetes for 3-5 years. Yearly eye exams are recommended after that initial eye exam.  If you are diagnosed with type 1 diabetes as an adult, get an exam within 5 years of diagnosis and then yearly.  If you are diagnosed with type 2 diabetes, get an exam as soon as possible after the diagnosis and then yearly. Foot care exam  Visual foot exams are performed at every routine medical visit. The exams check for cuts, injuries, or other problems with the feet.  A comprehensive foot exam should be done yearly. This includes visual inspection as well as assessing foot pulses and testing for loss of sensation.  Check your feet nightly for cuts, injuries, or other problems with your feet. Tell your health care provider if anything is not healing. Kidney function test (urine  microalbumin)  This test is performed once a year.  Type 1 diabetes: The first test is performed 5 years after diagnosis.  Type 2 diabetes: The first test is performed at the time of diagnosis.  A serum creatinine and estimated glomerular filtration rate (eGFR) test is done once a year to assess the level of chronic kidney disease (CKD), if present. Lipid profile (cholesterol, HDL, LDL, triglycerides)  Performed every 5 years for most people.  The goal for LDL is less than 100 mg/dL. If you are at high risk, the goal is less than 70 mg/dL.  The goal for HDL is 40 mg/dL-50 mg/dL for men and 50 mg/dL-60 mg/dL for women. An HDL cholesterol of 60 mg/dL or higher gives some protection against heart disease.  The goal for triglycerides is less than 150 mg/dL. Influenza vaccine, pneumococcal vaccine, and hepatitis B vaccine  The influenza vaccine is recommended yearly.  It is recommended that people with diabetes who are over 24 years old get the pneumonia vaccine. In some cases, two separate shots may be given. Ask your health care provider if your pneumonia vaccination is up to date.  The hepatitis B vaccine is also recommended for adults with diabetes. Diabetes self-management education  Education is recommended at diagnosis and ongoing as needed. Treatment plan  Your treatment plan is reviewed at every medical visit. Document Released: 04/28/2009 Document Revised: 11/15/2013 Document Reviewed: 12/01/2012 [facility] Patient Information 2015 [place],  LL[employer]. This information is not intended to replace advice given to you by your health care provider. Make sure you discuss any questions you have with your health care provider. How to Avoid Diabetes Problems You can do a lot to prevent or slow down diabetes problems. Following your diabetes plan and taking care of yourself can reduce your risk of serious or life-threatening complications. Below, you will find certain things you can do to  prevent diabetes problems. MANAGE YOUR DIABETES Follow your health care provider's, nurse educator's, and dietitian's instructions for managing your diabetes. They will teach you the basics of diabetes care. They can help answer questions you may have. Learn about diabetes and make healthy choices regarding eating and physical activity. Monitor your blood glucose level regularly. Your health care provider will help you decide how often to check your blood glucose level depending on your treatment goals and how well you are meeting them.  DO NOT USE NICOTINE Nicotine and diabetes are a dangerous combination. Nicotine raises your risk for diabetes problems. If you quit using nicotine, you will lower your risk for heart attack, stroke, nerve disease, and kidney disease. Your cholesterol and your blood pressure levels may improve. Your blood circulation will also improve. Do not use any tobacco products, including cigarettes, chewing tobacco, or electronic cigarettes. If you need help quitting, ask your health care provider. KEEP YOUR BLOOD PRESSURE UNDER CONTROL Keeping your blood pressure under control will help prevent damage to your eyes, kidneys, heart, and blood vessels. Blood pressure consists of two numbers. The top number should be below 120, and the bottom number should be below 80 (120/80). Keep your blood pressure as close to these numbers as you can. If you already have kidney disease, you may want even lower blood pressure to protect your kidneys. Talk to your health care provider to make sure that your blood pressure goal is right for your needs. Meal planning, medicines, and exercise can help you reach your blood pressure target. Have your blood pressure checked at every visit with your health care provider. KEEP YOUR CHOLESTEROL UNDER CONTROL Normal cholesterol levels will help prevent heart disease and stroke. These are the biggest health problems for people with diabetes. Keeping cholesterol  levels under control can also help with blood flow. Have your cholesterol level checked at least once a year. Your health care provider may prescribe a medicine known as a statin. Statins lower your cholesterol. If you are not taking a statin, ask your health care provider if you should be. Meal planning, exercise, and medicines can help you reach your cholesterol targets.  SCHEDULE AND KEEP YOUR ANNUAL PHYSICAL EXAMS AND EYE EXAMS Your health care provider will tell you how often he or she wants to see you depending on your plan of treatment. It is important that you keep these appointments so that possible problems can be identified early and complications can be avoided or treated.  Every visit with your health care provider should include your weight, blood pressure, and an evaluation of your blood glucose control.  Your hemoglobin A1c should be checked:  At least twice a year if you are at your goal.  Every 3 months if there are changes in treatment.  If you are not meeting your goals.  Your blood lipids should be checked yearly. You should also be checked yearly to see if you have protein in your urine (microalbumin).  Schedule a dilated eye exam within 5 years of your diagnosis if you have  type 1 diabetes, and then yearly. Schedule a dilated eye exam at diagnosis if you have type 2 diabetes, and then yearly. All exams thereafter can be extended to every 2 to 3 years if one or more exams have been normal. KEEP YOUR VACCINES CURRENT The flu vaccine is recommended yearly. The formula for the vaccine changes every year and needs to be updated for the best protection against current viruses. It is recommended that people with diabetes who are over 30 years old get the pneumonia vaccine. In some cases, two separate shots may be given. Ask your health care provider if your pneumonia vaccination is up-to-date. However, there are some instances where another vaccine is recommended. Check with your  health care provider. TAKE CARE OF YOUR FEET  Diabetes may cause you to have a poor blood supply (circulation) to your legs and feet. Because of this, the skin may be thinner, break easier, and heal more slowly. You also may have nerve damage in your legs and feet, causing decreased feeling. You may not notice minor injuries to your feet that could lead to serious problems or infections. Taking care of your feet is very important. Visual foot exams are performed at every routine medical visit. The exams check for cuts, injuries, or other problems with the feet. A comprehensive foot exam should be done yearly. This includes visual inspection as well as assessing foot pulses and testing for loss of sensation. You should also do the following:  Inspect your feet daily for cuts, calluses, blisters, ingrown toenails, and signs of infection, such as redness, swelling, or pus.  Wash and dry your feet thoroughly, especially between the toes.  Avoid soaking your feet regularly in hot water baths.  Moisturize dry skin with lotion, avoiding areas between your toes.  Cut toenails straight across and file the edges.  Avoid shoes that do not fit well or have areas that irritate your skin.  Avoid going barefooted or wearing only socks. Your feet need protection. TAKE CARE OF YOUR TEETH People with poorly controlled diabetes are more likely to have gum (periodontal) disease. These infections make diabetes harder to control. Periodontal diseases, if left untreated, can lead to tooth loss. Brush your teeth twice a day, floss, and see your dentist for checkups and cleaning every 6 months, or 2 times a year. ASK YOUR HEALTH CARE PROVIDER ABOUT TAKING ASPIRIN Taking aspirin daily is recommended to help prevent cardiovascular disease in people with and without diabetes. Ask your health care provider if this would benefit you and what dose he or she would recommend. DRINK RESPONSIBLY Moderate amounts of alcohol  (less than 1 drink per day for adult women and less than 2 drinks per day for adult men) have a minimal effect on blood glucose if ingested with food. It is important to eat food with alcohol to avoid hypoglycemia. People should avoid alcohol if they have a history of alcohol abuse or dependence, if they are pregnant, and if they have liver disease, pancreatitis, advanced neuropathy, or severe hypertriglyceridemia. LESSEN STRESS Living with diabetes can be stressful. When you are under stress, your blood glucose may be affected in two ways:  Stress hormones may cause your blood glucose to rise.  You may be distracted from taking good care of yourself. It is a good idea to be aware of your stress level and make changes that are necessary to help you better manage challenging situations. Support groups, planned relaxation, a hobby you enjoy, meditation, healthy relationships, and  exercise all work to lower your stress level. If your efforts do not seem to be helping, get help from your health care provider or a trained mental health professional. Document Released: 03/19/2011 Document Revised: 11/15/2013 Document Reviewed: 08/25/2013 [facility]  [employer] Patient Information 2015 [place], [place]. This information is not intended to replace advice given to you by your health care provider. Make sure you discuss any questions you have with your health care provider. Diabetes Mellitus and Food It is important for you to manage your blood sugar (glucose) level. Your blood glucose level can be greatly affected by what you eat. Eating healthier foods in the appropriate amounts throughout the day at about the same time each day will help you control your blood glucose level. It can also help slow or prevent worsening of your diabetes mellitus. Healthy eating may even help you improve the level of your blood pressure and reach or maintain a healthy weight.  HOW CAN FOOD AFFECT ME? Carbohydrates Carbohydrates affect your  blood glucose level more than any other type of food. Your dietitian will help you determine how many carbohydrates to eat at each meal and teach you how to count carbohydrates. Counting carbohydrates is important to keep your blood glucose at a healthy level, especially if you are using insulin or taking certain medicines for diabetes mellitus. Alcohol Alcohol can cause sudden decreases in blood glucose (hypoglycemia), especially if you use insulin or take certain medicines for diabetes mellitus. Hypoglycemia can be a life-threatening condition. Symptoms of hypoglycemia (sleepiness, dizziness, and disorientation) are similar to symptoms of having too much alcohol.  If your health care provider has given you approval to drink alcohol, do so in moderation and use the following guidelines:  Women should not have more than one drink per day, and men should not have more than two drinks per day. One drink is equal to:  12 oz of beer.  5 oz of wine.  1 oz of hard liquor.  Do not drink on an empty stomach.  Keep yourself hydrated. Have water, diet soda, or unsweetened iced tea.  Regular soda, juice, and other mixers might contain a lot of carbohydrates and should be counted. WHAT FOODS ARE NOT RECOMMENDED? As you make food choices, it is important to remember that all foods are not the same. Some foods have fewer nutrients per serving than other foods, even though they might have the same number of calories or carbohydrates. It is difficult to get your body what it needs when you eat foods with fewer nutrients. Examples of foods that you should avoid that are high in calories and carbohydrates but low in nutrients include:  Trans fats (most processed foods list trans fats on the Nutrition Facts label).  Regular soda.  Juice.  Candy.  Sweets, such as cake, pie, doughnuts, and cookies.  Fried foods. WHAT FOODS CAN I EAT? Have nutrient-rich foods, which will nourish your body and keep you  healthy. The food you should eat also will depend on several factors, including:  The calories you need.  The medicines you take.  Your weight.  Your blood glucose level.  Your blood pressure level.  Your cholesterol level. You also should eat a variety of foods, including:  Protein, such as meat, poultry, fish, tofu, nuts, and seeds (lean animal proteins are best).  Fruits.  Vegetables.  Dairy products, such as milk, cheese, and yogurt (low fat is best).  Breads, grains, pasta, cereal, rice, and beans.  Fats such as olive oil, trans  fat-free margarine, canola oil, avocado, and olives. DOES EVERYONE WITH DIABETES MELLITUS HAVE THE SAME MEAL PLAN? Because every person with diabetes mellitus is different, there is not one meal plan that works for everyone. It is very important that you meet with a dietitian who will help you create a meal plan that is just right for you. Document Released: 03/28/2005 Document Revised: 07/06/2013 Document Reviewed: 05/28/2013 [facility] Patient Information 2015 [place], [place]. This information is not intended to replace advice given to you by your health care provider. Make sure you discuss any questions you have with your health care provider.

## 2014-08-11 ENCOUNTER — Other Ambulatory Visit: Payer: Self-pay | Admitting: Family Medicine

## 2014-08-17 ENCOUNTER — Other Ambulatory Visit: Payer: Self-pay | Admitting: Family Medicine

## 2014-09-15 ENCOUNTER — Other Ambulatory Visit: Payer: Self-pay | Admitting: Family Medicine

## 2014-09-27 ENCOUNTER — Other Ambulatory Visit: Payer: Self-pay | Admitting: Family Medicine

## 2014-10-26 ENCOUNTER — Other Ambulatory Visit: Payer: Self-pay | Admitting: Family Medicine

## 2014-11-07 ENCOUNTER — Other Ambulatory Visit: Payer: Self-pay | Admitting: Physician Assistant

## 2014-11-08 NOTE — Telephone Encounter (Signed)
Advised pt he is overdue for f/up OV and he agreed to set up appt. Sch for 12/02/14 and RFd meds until then.

## 2014-11-13 DIAGNOSIS — E1129 Type 2 diabetes mellitus with other diabetic kidney complication: Secondary | ICD-10-CM | POA: Insufficient documentation

## 2014-11-13 DIAGNOSIS — R809 Proteinuria, unspecified: Secondary | ICD-10-CM

## 2014-12-02 ENCOUNTER — Telehealth: Payer: Self-pay

## 2014-12-02 ENCOUNTER — Encounter: Payer: Self-pay | Admitting: Family Medicine

## 2014-12-02 ENCOUNTER — Ambulatory Visit (INDEPENDENT_AMBULATORY_CARE_PROVIDER_SITE_OTHER): Payer: No Typology Code available for payment source | Admitting: Family Medicine

## 2014-12-02 VITALS — BP 151/95 | HR 114 | Temp 98.8°F | Resp 18 | Ht 75.0 in | Wt >= 6400 oz

## 2014-12-02 DIAGNOSIS — E1165 Type 2 diabetes mellitus with hyperglycemia: Secondary | ICD-10-CM

## 2014-12-02 DIAGNOSIS — G4730 Sleep apnea, unspecified: Secondary | ICD-10-CM | POA: Diagnosis not present

## 2014-12-02 DIAGNOSIS — R5383 Other fatigue: Secondary | ICD-10-CM | POA: Diagnosis not present

## 2014-12-02 DIAGNOSIS — E785 Hyperlipidemia, unspecified: Secondary | ICD-10-CM

## 2014-12-02 DIAGNOSIS — I10 Essential (primary) hypertension: Secondary | ICD-10-CM | POA: Diagnosis not present

## 2014-12-02 DIAGNOSIS — IMO0002 Reserved for concepts with insufficient information to code with codable children: Secondary | ICD-10-CM

## 2014-12-02 LAB — LIPID PANEL
CHOLESTEROL: 206 mg/dL — AB (ref 0–200)
HDL: 38 mg/dL — ABNORMAL LOW (ref >=40)
Total CHOL/HDL Ratio: 5.4 Ratio
Triglycerides: 547 mg/dL — ABNORMAL HIGH (ref <150)

## 2014-12-02 LAB — COMPREHENSIVE METABOLIC PANEL
ALT: 50 U/L (ref 0–53)
AST: 28 U/L (ref 0–37)
Albumin: 4.2 g/dL (ref 3.5–5.2)
Alkaline Phosphatase: 51 U/L (ref 39–117)
BUN: 14 mg/dL (ref 6–23)
CALCIUM: 9.4 mg/dL (ref 8.4–10.5)
CO2: 25 mEq/L (ref 19–32)
CREATININE: 0.8 mg/dL (ref 0.50–1.35)
Chloride: 95 mEq/L — ABNORMAL LOW (ref 96–112)
Glucose, Bld: 232 mg/dL — ABNORMAL HIGH (ref 70–99)
Potassium: 4.4 mEq/L (ref 3.5–5.3)
Sodium: 134 mEq/L — ABNORMAL LOW (ref 135–145)
Total Bilirubin: 0.9 mg/dL (ref 0.2–1.2)
Total Protein: 7.6 g/dL (ref 6.0–8.3)

## 2014-12-02 LAB — GLUCOSE, POCT (MANUAL RESULT ENTRY): POC Glucose: 227 mg/dl — AB (ref 70–99)

## 2014-12-02 LAB — POCT GLYCOSYLATED HEMOGLOBIN (HGB A1C): Hemoglobin A1C: 8.2

## 2014-12-02 MED ORDER — METFORMIN HCL ER 500 MG PO TB24: 2000.0000 mg | ORAL_TABLET | Freq: Every day | ORAL | Status: DC

## 2014-12-02 MED ORDER — LISINOPRIL-HYDROCHLOROTHIAZIDE 20-25 MG PO TABS: 1.0000 | ORAL_TABLET | Freq: Every day | ORAL | Status: DC

## 2014-12-02 MED ORDER — METFORMIN HCL ER (OSM) 1000 MG PO TB24: 2000.0000 mg | ORAL_TABLET | Freq: Every day | ORAL | Status: DC

## 2014-12-02 MED ORDER — GLIPIZIDE ER 5 MG PO TB24: 5.0000 mg | ORAL_TABLET | Freq: Every day | ORAL | Status: DC

## 2014-12-02 NOTE — Progress Notes (Signed)
Subjective:    Patient ID: Chad [name], male    DOB: [birth year], 44 y.o.   MRN: [id number] This chart was scribed for Shawnee Knapp, MD by Girtha Hake, ED Scribe. The patient's care was started at 3:11 PM.  Chief Complaint  Patient presents with  . Follow-up    DIABETES  . Medication Refill    glipizide, lisinopril, and metformin    HPI Chad [name] is a 44 y.o. male who was last seen 9 months ago. At this time, his blood pressure and pulse were uncontrolled. At his last visit, he had been out of his medications for several months and he suspected he had white coat hypertension. He was restarted on his Metformin 100 mg BID and started on Glucotrol XL 2.5 mg. He was told to recheck in 6 months. We increased his Lisinopril from 5 mg to 20 mg and had him stop stop his iron supplement. He reports that he runs out of medication in 3 days.  He is fasting today to have labs drawn. Patient complains of intermittent, burning pain in his feet at night.   Abdominal Pain: He complains of experiencing abdominal pains recently that he believes may be associated with Metformin. He reports experiencing nausea often in the morning and states that this is sometimes accompanied by a decreased appetite. Patient states that he ate sushi for dinner every night for two weeks and began to feel better after this period.   He states that he has been measuring his blood sugar levels regularly at home. He has experienced several episodes where he felt as though his blood sugar levels were low, but they in fact were not when he checked them. He does not have a blood pressure cuff at home, and is therefore unable to monitor his blood pressure.  Patient also complains of difficulty sleeping. He states that he often does not feel well-rested when he wakes up in the morning.  Medications and Supplements:  Ran out of glucotrol recently. He takes fish oil 1200 mg daily and he takes vitamin C daily.    Past  Medical History  Diagnosis Date  . GERD (gastroesophageal reflux disease)   . Hypertension   . Diabetes mellitus without complication    Current Outpatient Prescriptions on File Prior to Visit  Medication Sig Dispense Refill  . aspirin EC 81 MG tablet Take 1 tablet (81 mg total) by mouth daily.    Marland Kitchen glipiZIDE (GLUCOTROL XL) 2.5 MG 24 hr tablet TAKE 1 TABLET BY MOUTH EVERY DAY WITH BREAKFAST 30 tablet 0  . ibuprofen (ADVIL,MOTRIN) 200 MG tablet Take 400 mg by mouth daily.    Marland Kitchen lisinopril (PRINIVIL,ZESTRIL) 20 MG tablet Take 1 tablet (20 mg total) by mouth daily. 30 tablet 0  . metFORMIN (GLUCOPHAGE) 1000 MG tablet TAKE 1 TABLET BY MOUTH TWICE DAILY WITH A MEAL.  "OV NEEDED FOR ADDITIONAL REFILLS" 180 tablet 0  . omeprazole (PRILOSEC OTC) 20 MG tablet Take 20 mg by mouth daily.    . Multiple Vitamins-Minerals (ZINC PO) Take 1 tablet by mouth daily.    . vitamin B-12 (CYANOCOBALAMIN) 250 MCG tablet Take 250 mcg by mouth daily.    Marland Kitchen VITAMIN E PO Take 1 capsule by mouth daily.     No current facility-administered medications on file prior to visit.   No Known Allergies   Review of Systems  Constitutional: Positive for fatigue. Negative for fever, chills, activity change, appetite change and unexpected weight change.  Eyes:  Negative for visual disturbance.  Respiratory: Positive for apnea. Negative for cough, chest tightness and shortness of breath.   Cardiovascular: Negative for chest pain, palpitations and leg swelling.  Gastrointestinal: Positive for nausea, abdominal pain and diarrhea. Negative for vomiting, constipation and abdominal distention.  Endocrine: Negative for polydipsia, polyphagia and polyuria.  Genitourinary: Negative for dysuria, frequency, hematuria, flank pain, decreased urine volume and difficulty urinating.  Musculoskeletal: Positive for back pain and arthralgias. Negative for gait problem.       Bilateral foot pain.  Neurological: Negative for dizziness, syncope,  facial asymmetry, weakness, light-headedness, numbness and headaches.  Psychiatric/Behavioral: Positive for sleep disturbance.       Objective:   Physical Exam  Constitutional: He is oriented to person, place, and time. He appears well-developed and well-nourished. No distress.  HENT:  Head: Normocephalic and atraumatic.  Eyes: Conjunctivae and EOM are normal.  Neck: Neck supple. No tracheal deviation present.  Cardiovascular: Normal rate.   Pulmonary/Chest: Effort normal and breath sounds normal. No respiratory distress. He has no wheezes. He has no rales.  Musculoskeletal: Normal range of motion.  Neurological: He is alert and oriented to person, place, and time.  Skin: Skin is warm and dry.  Psychiatric: He has a normal mood and affect. His behavior is normal.  Nursing note and vitals reviewed.  Diabetic Foot Exam - Simple   Simple Foot Form  Diabetic Foot exam was performed with the following findings:  Yes 12/02/2014  1:18 PM  Visual Inspection - nml  Sensation Testing - nml  Pulse Check - nml  Comments      Filed Vitals:   12/02/14 1310  BP: 151/95  Pulse: 114  Temp: 98.8 F (37.1 C)  Resp: 18   Results for orders placed or performed in visit on 12/02/14  POCT glucose (manual entry)  Result Value Ref Range   POC Glucose 227 (A) 70 - 99 mg/dl  POCT glycosylated hemoglobin (Hb A1C)  Result Value Ref Range   Hemoglobin A1C 8.2           Assessment & Plan:   1. Type II diabetes mellitus, uncontrolled - a1c worsening from 7.7 9 mos ago even though pt has started glipizide since then - he has trouble following up as freq as recommended and has not been able to go to DM education so not seeing great results to cbgs from when initially diagnosed in 03/2013, also does not f/u in OV as freq as recommended - limited due to work - so does run out of medication often.  Change metformin 1000 bid to XR dosing to see if we can improve compliance and decrease GI side effects.  Increase glucotrol xl from 2.5 to 5 - may need to go up to bid or increase to 10. Refer to optho for initial dm eye exam. Cont asa. Cont acei.  Likely developing diabetic neuropathy so needs to have improved control - declines need for nerve pain med at this point.  2. Sleep apnea - suspect from history/exam - very large neck - refer to [facility] for eval and sleep study  3. Other fatigue   4. Morbid obesity   5. Essential hypertension, benign - not at goal on lisinopirl so add in hctz  6. Hyperlipidemia LDL goal <100 - recheck today, unchanged from prior so start pravastatin 40 - needs lfts and repeat lipids at f/u. Cont fish oil    Orders Placed This Encounter  Procedures  . Lipid panel  Order Specific Question:  Has the patient fasted?    Answer:  Yes  . Comprehensive metabolic panel    Order Specific Question:  Has the patient fasted?    Answer:  Yes  . Microalbumin, urine  . Ambulatory referral to Sleep Studies    Referral Priority:  Routine    Referral Type:  Consultation    Referral Reason:  Specialty Services Required    Number of Visits Requested:  1  . Ambulatory referral to Ophthalmology    Referral Priority:  Routine    Referral Type:  Consultation    Referral Reason:  Specialty Services Required    Requested Specialty:  Ophthalmology    Number of Visits Requested:  1  . POCT glucose (manual entry)  . POCT glycosylated hemoglobin (Hb A1C)  . HM Diabetes Foot Exam    Meds ordered this encounter  Medications  . Ascorbic Acid (VITAMIN C) 1000 MG tablet    Sig: Take 1,000 mg by mouth daily.  . Omega-3 Fatty Acids (FISH OIL) 1200 MG CAPS    Sig: Take by mouth daily.  Marland Kitchen glipiZIDE (GLUCOTROL XL) 5 MG 24 hr tablet    Sig: Take 1 tablet (5 mg total) by mouth daily with breakfast.    Dispense:  90 tablet    Refill:  1  . lisinopril-hydrochlorothiazide (PRINZIDE,ZESTORETIC) 20-25 MG per tablet    Sig: Take 1 tablet by mouth daily.    Dispense:  90 tablet    Refill:  3  .  DISCONTD: metformin (FORTAMET) 1000 MG (OSM) 24 hr tablet    Sig: Take 2 tablets (2,000 mg total) by mouth daily with breakfast.    Dispense:  180 tablet    Refill:  3  . pravastatin (PRAVACHOL) 40 MG tablet    Sig: Take 1 tablet (40 mg total) by mouth daily with supper.    Dispense:  90 tablet    Refill:  1    I personally performed the services described in this documentation, which was scribed in my presence. The recorded information has been reviewed and considered, and addended by me as needed.  Delman Cheadle, MD MPH

## 2014-12-02 NOTE — Telephone Encounter (Signed)
Pharm called to report the metformin ER 1000 mg that Dr Brigitte Pulse had sent in for pt is over $1500 and wanted to change to something less expensive. Checked w/Dr Brigitte Pulse and she advised she doesn't care which metformin ER is dispensed as long as it is the ER d/t pt having nausea w/ IR version. Spoke to pharm and OKd change to metformin 500 ER, 4 tabs daily (equals 2000 mg QD).

## 2014-12-03 LAB — MICROALBUMIN, URINE: Microalb, Ur: 5.8 mg/dL — ABNORMAL HIGH (ref <2.0)

## 2014-12-05 MED ORDER — PRAVASTATIN SODIUM 40 MG PO TABS: 40.0000 mg | ORAL_TABLET | Freq: Every day | ORAL | Status: DC

## 2014-12-14 DIAGNOSIS — K760 Fatty (change of) liver, not elsewhere classified: Secondary | ICD-10-CM

## 2014-12-14 DIAGNOSIS — K8020 Calculus of gallbladder without cholecystitis without obstruction: Secondary | ICD-10-CM

## 2014-12-14 HISTORY — DX: Calculus of gallbladder without cholecystitis without obstruction: K80.20

## 2014-12-14 HISTORY — DX: Fatty (change of) liver, not elsewhere classified: K76.0

## 2015-01-03 ENCOUNTER — Emergency Department (HOSPITAL_COMMUNITY): Payer: No Typology Code available for payment source

## 2015-01-03 ENCOUNTER — Emergency Department (HOSPITAL_COMMUNITY)
Admission: EM | Admit: 2015-01-03 | Discharge: 2015-01-03 | Disposition: A | Discharge status: Home / Self Care | Payer: No Typology Code available for payment source | Attending: Emergency Medicine | Admitting: Emergency Medicine

## 2015-01-03 ENCOUNTER — Encounter (HOSPITAL_COMMUNITY): Payer: Self-pay | Admitting: Emergency Medicine

## 2015-01-03 DIAGNOSIS — R0602 Shortness of breath: Secondary | ICD-10-CM | POA: Insufficient documentation

## 2015-01-03 DIAGNOSIS — R000 Tachycardia, unspecified: Secondary | ICD-10-CM | POA: Diagnosis not present

## 2015-01-03 DIAGNOSIS — I10 Essential (primary) hypertension: Secondary | ICD-10-CM | POA: Diagnosis not present

## 2015-01-03 DIAGNOSIS — K219 Gastro-esophageal reflux disease without esophagitis: Secondary | ICD-10-CM | POA: Insufficient documentation

## 2015-01-03 DIAGNOSIS — Z7982 Long term (current) use of aspirin: Secondary | ICD-10-CM | POA: Diagnosis not present

## 2015-01-03 DIAGNOSIS — R42 Dizziness and giddiness: Secondary | ICD-10-CM | POA: Diagnosis not present

## 2015-01-03 DIAGNOSIS — Z791 Long term (current) use of non-steroidal anti-inflammatories (NSAID): Secondary | ICD-10-CM | POA: Insufficient documentation

## 2015-01-03 DIAGNOSIS — R0789 Other chest pain: Secondary | ICD-10-CM | POA: Diagnosis not present

## 2015-01-03 DIAGNOSIS — R61 Generalized hyperhidrosis: Secondary | ICD-10-CM | POA: Insufficient documentation

## 2015-01-03 DIAGNOSIS — R079 Chest pain, unspecified: Secondary | ICD-10-CM | POA: Diagnosis present

## 2015-01-03 DIAGNOSIS — E119 Type 2 diabetes mellitus without complications: Secondary | ICD-10-CM | POA: Insufficient documentation

## 2015-01-03 DIAGNOSIS — Z79899 Other long term (current) drug therapy: Secondary | ICD-10-CM | POA: Insufficient documentation

## 2015-01-03 DIAGNOSIS — R110 Nausea: Secondary | ICD-10-CM | POA: Diagnosis not present

## 2015-01-03 LAB — BASIC METABOLIC PANEL
ANION GAP: 12 (ref 5–15)
BUN: 10 mg/dL (ref 6–20)
CALCIUM: 9 mg/dL (ref 8.9–10.3)
CO2: 22 mmol/L (ref 22–32)
Chloride: 100 mmol/L — ABNORMAL LOW (ref 101–111)
Creatinine, Ser: 0.72 mg/dL (ref 0.61–1.24)
GLUCOSE: 218 mg/dL — AB (ref 65–99)
POTASSIUM: 3.9 mmol/L (ref 3.5–5.1)
SODIUM: 134 mmol/L — AB (ref 135–145)

## 2015-01-03 LAB — CBC
HEMATOCRIT: 43.8 % (ref 39.0–52.0)
HEMOGLOBIN: 15.6 g/dL (ref 13.0–17.0)
MCH: 32.6 pg (ref 26.0–34.0)
MCHC: 35.6 g/dL (ref 30.0–36.0)
MCV: 91.6 fL (ref 78.0–100.0)
Platelets: 250 10*3/uL (ref 150–400)
RBC: 4.78 MIL/uL (ref 4.22–5.81)
RDW: 12.5 % (ref 11.5–15.5)
WBC: 9.2 10*3/uL (ref 4.0–10.5)

## 2015-01-03 LAB — I-STAT TROPONIN, ED
TROPONIN I, POC: 0.01 ng/mL (ref 0.00–0.08)
Troponin i, poc: 0.01 ng/mL (ref 0.00–0.08)

## 2015-01-03 LAB — BRAIN NATRIURETIC PEPTIDE: B Natriuretic Peptide: 28.6 pg/mL (ref 0.0–100.0)

## 2015-01-03 LAB — CBG MONITORING, ED: Glucose-Capillary: 184 mg/dL — ABNORMAL HIGH (ref 65–99)

## 2015-01-03 MED ORDER — ASPIRIN 325 MG PO TABS
325.0000 mg | ORAL_TABLET | Freq: Once | ORAL | Status: AC
  Administered 2015-01-03: 325 mg via ORAL
  Filled 2015-01-03: qty 1

## 2015-01-03 MED ORDER — ONDANSETRON HCL 4 MG/2ML IJ SOLN
4.0000 mg | Freq: Once | INTRAMUSCULAR | Status: DC
  Filled 2015-01-03: qty 2

## 2015-01-03 NOTE — ED Provider Notes (Signed)
CSN: [id number]     Arrival date & time 01/03/15  4098 History   First MD Initiated Contact with Patient 01/03/15 [phone]     Chief Complaint  Patient presents with  . Chest Pain  . Nausea  . Dizziness   HPI  Chad [name] is a 44 yo male with PMHx of HTN, GERD, and T2DM who presents with complaint of chest pain, shortness of breath, nausea, lightheadedness and diaphoresis. Patient states symptoms started at 0500 this morning. He describes the chest pain as if someone is poking him. Pain is located on the right and left sides of his chest, lasted for 2-3 minutes and then resolved. Pain was not severe, 2/10. He did have a "muscle twinge" pain under his left scapula as well, but denied any radiation of pain to his left arm or to his neck. Pain was associated with some shortness of breath, nausea and diaphoresis as above. He did feel lightheaded, but did not syncopize. Patient states he has a history of these types of chest pain which occur several times daily, last for 2-3 minutes, resolve on their own for the past year. However, they are not normally associated with the other symptoms of diaphoresis, nausea, shortness of breath and lightheadedness.   He has not taken any medications this morning. He drinks 3 alcoholic drinks per night and dips tobacco, but denies smoking tobacco or illicit drug use.   Patient has no prior history of MI, but does have a significant family history of MI with heart attacks in his father, uncles in their 49s. Patient has had a stress test in the past which he thinks was normal. No prior echocardiograms. Pain does not feel like GERD.   No prior history of DVT, PE. No recent illness, surgeries, travel. No swelling or pain in lower extremities.  Past Medical History  Diagnosis Date  . GERD (gastroesophageal reflux disease)   . Hypertension   . Diabetes mellitus without complication    History reviewed. No pertinent past surgical history. Family History  Problem Relation  Age of Onset  . Diabetes Father    History  Substance Use Topics  . Smoking status: Never Smoker   . Smokeless tobacco: Not on file     Comment: occasionally dips tobacco  . Alcohol Use: Yes     Comment: 3-4 drinks of liqour a night, has been cutting back.      Review of Systems General: Admits to diaphoresis. Denies fever, chills, fatigue, change in appetite Respiratory: Admits to SOB, productive cough. Denies DOE, chest tightness, and wheezing.   Cardiovascular: Admits to chest pain. Denies palpitations.  Gastrointestinal: Admits to nausea. Denies vomiting, abdominal pain, diarrhea, constipation Skin: Denies pallor, rash and wounds.  Neurological: Admits to dizziness, weakness, lightheadedness. Denies numbness and syncope, Psychiatric/Behavioral: Denies mood changes, confusion, nervousness, sleep disturbance and agitation.  Allergies  Review of patient's allergies indicates no known allergies.  Home Medications   Prior to Admission medications   Medication Sig Start Date End Date Taking? Authorizing Provider  Ascorbic Acid (VITAMIN C) 1000 MG tablet Take 1,000 mg by mouth daily.   Yes Historical Provider, MD  aspirin EC 81 MG tablet Take 1 tablet (81 mg total) by mouth daily. 03/01/14  Yes Shawnee Knapp, MD  glipiZIDE (GLUCOTROL XL) 5 MG 24 hr tablet Take 1 tablet (5 mg total) by mouth daily with breakfast. 12/02/14  Yes Shawnee Knapp, MD  ibuprofen (ADVIL,MOTRIN) 200 MG tablet Take 400 mg by mouth daily.  Yes Historical Provider, MD  lisinopril-hydrochlorothiazide (PRINZIDE,ZESTORETIC) 20-25 MG per tablet Take 1 tablet by mouth daily. 12/02/14  Yes Shawnee Knapp, MD  metFORMIN (GLUCOPHAGE-XR) 500 MG 24 hr tablet Take 4 tablets (2,000 mg total) by mouth daily with breakfast. 12/02/14  Yes Shawnee Knapp, MD  Omega-3 Fatty Acids (FISH OIL) 1200 MG CAPS Take by mouth daily.   Yes Historical Provider, MD  omeprazole (PRILOSEC OTC) 20 MG tablet Take 20 mg by mouth daily.   Yes Historical Provider,  MD  pravastatin (PRAVACHOL) 40 MG tablet Take 1 tablet (40 mg total) by mouth daily with supper. 12/05/14  Yes Shawnee Knapp, MD   Physical Exam  Filed Vitals:   01/03/15 1000 01/03/15 1010 01/03/15 1030 01/03/15 1130  BP: 130/111 148/97 119/66 101/71  Pulse: 100 99 100 98  Temp:      TempSrc:      Resp: 15 22 19 21   Height:      Weight:      SpO2: 99% 99% 96% 96%   General: Vital signs reviewed.  Patient is obese, diaphoretic and cooperative with exam.  Cardiovascular: Mildly tachcardic, S1 normal, S2 normal, no murmurs, gallops, or rubs. Pulmonary/Chest: Clear to auscultation bilaterally, no wheezes, rales, or rhonchi. Abdominal: Soft, non-tender, non-distended, BS + Extremities: No lower extremity edema bilaterally, pulses symmetric and intact bilaterally. No calf tenderness. Neurological: A&O x3 Skin: Warm and intact. No rashes or erythema. Psychiatric: Normal mood and affect. speech and behavior is normal. Cognition and memory are normal.    ED Course  Procedures (including critical care time) Labs Review Labs Reviewed  BASIC METABOLIC PANEL - Abnormal; Notable for the following:    Sodium 134 (*)    Chloride 100 (*)    Glucose, Bld 218 (*)    All other components within normal limits  CBG MONITORING, ED - Abnormal; Notable for the following:    Glucose-Capillary 184 (*)    All other components within normal limits  CBC  BRAIN NATRIURETIC PEPTIDE  I-STAT TROPOININ, ED  Randolm Idol, ED    Imaging Review Dg Chest Port 1 View  01/03/2015   CLINICAL DATA:  Chest pain with nausea and dizziness  EXAM: PORTABLE CHEST - 1 VIEW  COMPARISON:  03/20/2013  FINDINGS: Cardiac enlargement without heart failure. Lungs are clear without infiltrate or effusion. Negative for mass or adenopathy.  IMPRESSION: No active disease.   Electronically Signed   By: Franchot Gallo M.D.   On: 01/03/2015 08:16     EKG Interpretation   Date/Time:  Tuesday January 03 2015 07:36:05  EDT Ventricular Rate:  97 PR Interval:  181 QRS Duration: 127 QT Interval:  376 QTC Calculation: 478 R Axis:   -66 Text Interpretation:  Sinus rhythm  Left bundle branch block Baseline  wander in lead(s) V5 V6 No significant change since last tracing Confirmed  by DOCHERTY  MD, MEGAN ([id number]) on 01/03/2015 7:44:03 AM      MDM   Final diagnoses:  Other chest pain   44 yo male presenting with chest pain, shortness of breath, nausea, lightheadedness and diaphoresis. EKG shows LVH, but no ST elevations or depression or TWI which is similar to prior EKG comparisons. CXR shows no active disease. CBC and BMET within normal limits. Troponin poc normal at 0800. BNP normal. Blood pressure was checked on each side and were similar and normotensive. Given that patient's symptoms are concerning for ACS, patient was given ASA 325 mg once. We will recheck a  troponin in 3 hours. Repeat troponin negative.   Doubt DVT/PE as chest pain resolved, shortness of breath resolved, patient is only mildly tachycardic, no hemoptysis and Well's Criteria score is zero. Doubt aortic dissection given lack of back pain and normotensive BP in bilateral upper extremities. Doubt CHF given normal BNP, no prior history and lack of rales or LE edema. No evidence of pericarditis on physical exam or EKG. Presentation and physical exam not consistent with PTX.  Chest pain possible secondary to uncontrolled GERD.    Patient is currently comfortable and without pain, shortness of breath or nausea. Safe to be discharged home. Recommend follow up with PCP with cardiology referral and repeat stress testing.   Case was discussed in full with ED attending physician, Dr. Tawnya Crook.  Osa Craver, DO PGY-1 Internal Medicine Resident Pager # [phone] 01/03/2015 11:44 AM     Roxine Caddy Sherral Hammers, MD 01/03/15 [place], MD 01/04/15 1131

## 2015-01-03 NOTE — Discharge Instructions (Signed)
·   Thank you for allowing [place] to be involved in your healthcare while you were hospitalized at [facility].   Please note that there have not been changes to your home medications.  --> PLEASE LOOK AT YOUR DISCHARGE MEDICATION LIST FOR DETAILS.  Please call your PCP if you have any questions or concerns, or any difficulty getting any of your medications.  Please return to the ER if you have worsening of your symptoms or new severe symptoms arise.   FOLLOW UP WITH YOUR PRIMARY CARE DOCTOR. WE RECOMMEND A REPEAT STRESS TEST AND POSSIBLE ECHOCARDIOGRAM WITH REFERRAL TO CARDIOLOGY. ALSO DISCUSS WITH YOUR PRIMARY CARE DOCTOR ABOUT SLEEP APNEA AND POSSIBLY DOING A SLEEP STUDY.  I RECOMMEND TRYING TO LOSE WEIGHT AND CUTTING BACK ON ALCOHOL USE.   IF YOU HAVE RECURRENT CHEST PAIN, SHORTNESS OF BREATH, NAUSEA OR SWEATING, RETURN TO THE EMERGENCY DEPARTMENT.   Chest Pain (Nonspecific) It is often hard to give a diagnosis for the cause of chest pain. There is always a chance that your pain could be related to something serious, such as a heart attack or a blood clot in the lungs. You need to follow up with your doctor. HOME CARE  If antibiotic medicine was given, take it as directed by your doctor. Finish the medicine even if you start to feel better.  For the next few days, avoid activities that bring on chest pain. Continue physical activities as told by your doctor.  Do not use any tobacco products. This includes cigarettes, chewing tobacco, and e-cigarettes.  Avoid drinking alcohol.  Only take medicine as told by your doctor.  Follow your doctor's suggestions for more testing if your chest pain does not go away.  Keep all doctor visits you made. GET HELP IF:  Your chest pain does not go away, even after treatment.  You have a rash with blisters on your chest.  You have a fever. GET HELP RIGHT AWAY IF:   You have more pain or pain that spreads to your arm, neck, jaw,  back, or belly (abdomen).  You have shortness of breath.  You cough more than usual or cough up blood.  You have very bad back or belly pain.  You feel sick to your stomach (nauseous) or throw up (vomit).  You have very bad weakness.  You pass out (faint).  You have chills. This is an emergency. Do not wait to see if the problems will go away. Call your local emergency services (911 in [place] Do not drive yourself to the hospital. MAKE SURE YOU:   Understand these instructions.  Will watch your condition.  Will get help right away if you are not doing well or get worse. Document Released: 12/18/2007 Document Revised: 07/06/2013 Document Reviewed: 12/18/2007 [facility] Patient Information 2015 [place], [place]. This information is not intended to replace advice given to you by your health care provider. Make sure you discuss any questions you have with your health care provider.

## 2015-01-03 NOTE — ED Notes (Signed)
Woke this am at 0300, dizziness at 0500 and then shortly thereafter nausea diaphoresis, cp not present currently, pt is diaphoretic however and slighty nauseated, hx of type II DM and largely obese.

## 2015-01-05 NOTE — Progress Notes (Deleted)
Patient ID: Chad [name], male   DOB: Dec 07, 1970, 44 y.o.   MRN: [id number]  I last saw 1 mo ago. At that OV his hgb a1c had increase to hgba1c 7.7 from 8.2 9 ms prior and 9.0 one yr prior so pt's glipizide was increased from glucotrol xl 2.5 qam to.5 qam.  Maxed out on metformin but pt was changed to XL formulation to increase compliance.  Urine microalb nml last mo and was referred to optho at that time.  Pt was never able to go to DM ed due to working hours after he was diagnosed with DMII in 03/2013.  I was concerned that pt was showing signs of diabetic neuropathy at that time. We started pravastatin 40 and recommended to start fish oil as well at that time due to LDL not calcuable w/ total 206, trig 547, hdl 38 and LCL [place] as ASCVD risk was near 20% and there had been no sig changes in lipid panel after a trial of tlc which he was encouraged to cont to keep up. Goal LDL <100.  Plan was to recheck lipid panel in 3 more mos and needs repeat lfts today due to started statin 1 mo prior.  Has appt in 3 mos on 9/23 w/ me sched for this.   His HTN was also uncontrolled so started hctz in addition to the lisinopril. On asa. He was referred for a sleep study at last visit as well but pt declined to schedule when referrals called.  He was seen in the ER 3d prior for c/o sudden same day onset of atypical CP and at other times dizziness,  SHoB, nausea, and diaphoresis. Had nml exam, EKG, - trop x 2, cbc, bmp, bnp and CXR - he was recommended to f/u w/ PCP to get referral for stress test asap.  If sxs persist at all, rec chest CT as CTA 2 yrs prev showed hepatic steatosis, cardiomegaly w/ lung findings c/w mild CHF, and small airway disease.  At ER visit pt admitted drinking 3 alcoholic drinks per night and dips tobacco, but denies smoking tobacco or illicit drug use.   Patient has no prior history of MI, but does have a significant family history of MI with heart attacks in his father, uncles in their 58s.  Patient has had a stress test in the past which he thinks was normal. No prior echocardiograms. Pain does not feel like GERD.   No prior history of DVT, PE. No recent illness, surgeries, travel. No swelling or pain in lower extremities.  Prior bnp  Get orthostatics.

## 2015-01-06 ENCOUNTER — Ambulatory Visit (INDEPENDENT_AMBULATORY_CARE_PROVIDER_SITE_OTHER): Payer: No Typology Code available for payment source | Admitting: Family Medicine

## 2015-01-06 ENCOUNTER — Encounter: Payer: Self-pay | Admitting: Family Medicine

## 2015-01-06 VITALS — BP 154/96 | HR 93 | Temp 98.2°F | Resp 16 | Ht 75.0 in | Wt >= 6400 oz

## 2015-01-06 DIAGNOSIS — R739 Hyperglycemia, unspecified: Secondary | ICD-10-CM | POA: Diagnosis not present

## 2015-01-06 DIAGNOSIS — F1099 Alcohol use, unspecified with unspecified alcohol-induced disorder: Secondary | ICD-10-CM

## 2015-01-06 DIAGNOSIS — R9389 Abnormal findings on diagnostic imaging of other specified body structures: Secondary | ICD-10-CM

## 2015-01-06 DIAGNOSIS — R42 Dizziness and giddiness: Secondary | ICD-10-CM | POA: Diagnosis not present

## 2015-01-06 DIAGNOSIS — R0789 Other chest pain: Secondary | ICD-10-CM

## 2015-01-06 DIAGNOSIS — Z7289 Other problems related to lifestyle: Secondary | ICD-10-CM

## 2015-01-06 DIAGNOSIS — I10 Essential (primary) hypertension: Secondary | ICD-10-CM | POA: Diagnosis not present

## 2015-01-06 DIAGNOSIS — R938 Abnormal findings on diagnostic imaging of other specified body structures: Secondary | ICD-10-CM

## 2015-01-06 DIAGNOSIS — E1165 Type 2 diabetes mellitus with hyperglycemia: Secondary | ICD-10-CM | POA: Insufficient documentation

## 2015-01-06 DIAGNOSIS — K8020 Calculus of gallbladder without cholecystitis without obstruction: Secondary | ICD-10-CM

## 2015-01-06 DIAGNOSIS — Z79899 Other long term (current) drug therapy: Secondary | ICD-10-CM | POA: Diagnosis not present

## 2015-01-06 DIAGNOSIS — R110 Nausea: Secondary | ICD-10-CM

## 2015-01-06 DIAGNOSIS — E162 Hypoglycemia, unspecified: Secondary | ICD-10-CM | POA: Diagnosis not present

## 2015-01-06 DIAGNOSIS — K760 Fatty (change of) liver, not elsewhere classified: Secondary | ICD-10-CM

## 2015-01-06 DIAGNOSIS — E1140 Type 2 diabetes mellitus with diabetic neuropathy, unspecified: Secondary | ICD-10-CM

## 2015-01-06 DIAGNOSIS — R5383 Other fatigue: Secondary | ICD-10-CM

## 2015-01-06 DIAGNOSIS — Z789 Other specified health status: Secondary | ICD-10-CM

## 2015-01-06 LAB — COMPREHENSIVE METABOLIC PANEL
ALBUMIN: 4.1 g/dL (ref 3.5–5.2)
ALT: 64 U/L — ABNORMAL HIGH (ref 0–53)
AST: 35 U/L (ref 0–37)
Alkaline Phosphatase: 38 U/L — ABNORMAL LOW (ref 39–117)
BUN: 9 mg/dL (ref 6–23)
CHLORIDE: 103 meq/L (ref 96–112)
CO2: 25 mEq/L (ref 19–32)
CREATININE: 0.75 mg/dL (ref 0.50–1.35)
Calcium: 9.3 mg/dL (ref 8.4–10.5)
Glucose, Bld: 261 mg/dL — ABNORMAL HIGH (ref 70–99)
Potassium: 4.5 mEq/L (ref 3.5–5.3)
Sodium: 141 mEq/L (ref 135–145)
Total Bilirubin: 0.7 mg/dL (ref 0.2–1.2)
Total Protein: 7.1 g/dL (ref 6.0–8.3)

## 2015-01-06 LAB — POCT URINALYSIS DIPSTICK
Bilirubin, UA: NEGATIVE
Blood, UA: NEGATIVE
Glucose, UA: 500
Ketones, UA: NEGATIVE
Leukocytes, UA: NEGATIVE
Nitrite, UA: NEGATIVE
PROTEIN UA: NEGATIVE
Urobilinogen, UA: 0.2
pH, UA: 5.5

## 2015-01-06 LAB — POCT UA - MICROSCOPIC ONLY
Casts, Ur, LPF, POC: NEGATIVE
Crystals, Ur, HPF, POC: NEGATIVE
MUCUS UA: POSITIVE
Yeast, UA: NEGATIVE

## 2015-01-06 LAB — VITAMIN B12: Vitamin B-12: 565 pg/mL (ref 211–911)

## 2015-01-06 LAB — GLUCOSE, POCT (MANUAL RESULT ENTRY): POC Glucose: 272 mg/dl — AB (ref 70–99)

## 2015-01-06 LAB — LIPASE: LIPASE: 43 U/L (ref 0–75)

## 2015-01-06 LAB — FOLATE: FOLATE: 8.6 ng/mL

## 2015-01-06 LAB — POCT SEDIMENTATION RATE: POCT SED RATE: 11 mm/hr (ref 0–22)

## 2015-01-06 MED ORDER — SITAGLIPTIN PHOSPHATE 100 MG PO TABS: 100.0000 mg | ORAL_TABLET | Freq: Every day | ORAL | Status: DC

## 2015-01-06 MED ORDER — BLOOD GLUCOSE MONITOR KIT: PACK | Status: DC

## 2015-01-06 MED ORDER — CYCLOBENZAPRINE HCL 10 MG PO TABS: 10.0000 mg | ORAL_TABLET | Freq: Every evening | ORAL | Status: DC | PRN

## 2015-01-06 MED ORDER — GLUCOSE BLOOD VI STRP: ORAL_STRIP | Status: DC

## 2015-01-06 MED ORDER — CARVEDILOL 6.25 MG PO TABS: 6.2500 mg | ORAL_TABLET | Freq: Two times a day (BID) | ORAL | Status: DC

## 2015-01-06 MED ORDER — LANCETS MISC: Status: DC

## 2015-01-06 MED ORDER — MELOXICAM 15 MG PO TABS: 15.0000 mg | ORAL_TABLET | Freq: Every day | ORAL | Status: DC

## 2015-01-06 NOTE — Patient Instructions (Addendum)
Make sure you check your blood sugar whenever you are having one of these episodes - I suspect there has been a recent change as your sugars today are much to high. You need to check your blood pressure outside of the office as it has also spiked.  Since there has been a sudden and recent change, stop alcohol at night. A glass of red wine if necessary, increase fluids.  Can try the flexeril at night instead to help you sleep.  If you are still having trouble sleeping or resting or more pain, please call and we can try a different medication.  Hypoglycemia Hypoglycemia occurs when the glucose in your blood is too low. Glucose is a type of sugar that is your body's main energy source. Hormones, such as insulin and glucagon, control the level of glucose in the blood. Insulin lowers blood glucose and glucagon increases blood glucose. Having too much insulin in your blood stream, or not eating enough food containing sugar, can result in hypoglycemia. Hypoglycemia can happen to people with or without diabetes. It can develop quickly and can be a medical emergency.  CAUSES   Missing or delaying meals.  Not eating enough carbohydrates at meals.  Taking too much diabetes medicine.  Not timing your oral diabetes medicine or insulin doses with meals, snacks, and exercise.  Nausea and vomiting.  Certain medicines.  Severe illnesses, such as hepatitis, kidney disorders, and certain eating disorders.  Increased activity or exercise without eating something extra or adjusting medicines.  Drinking too much alcohol.  A nerve disorder that affects body functions like your heart rate, blood pressure, and digestion (autonomic neuropathy).  A condition where the stomach muscles do not function properly (gastroparesis). Therefore, medicines and food may not absorb properly.  Rarely, a tumor of the pancreas can produce too much insulin. SYMPTOMS   Hunger.  Sweating (diaphoresis).  Change in body  temperature.  Shakiness.  Headache.  Anxiety.  Lightheadedness.  Irritability.  Difficulty concentrating.  Dry mouth.  Tingling or numbness in the hands or feet.  Restless sleep or sleep disturbances.  Altered speech and coordination.  Change in mental status.  Seizures or prolonged convulsions.  Combativeness.  Drowsiness (lethargic).  Weakness.  Increased heart rate or palpitations.  Confusion.  Pale, gray skin color.  Blurred or double vision.  Fainting. DIAGNOSIS  A physical exam and medical history will be performed. Your caregiver may make a diagnosis based on your symptoms. Blood tests and other lab tests may be performed to confirm a diagnosis. Once the diagnosis is made, your caregiver will see if your signs and symptoms go away once your blood glucose is raised.  TREATMENT  Usually, you can easily treat your hypoglycemia when you notice symptoms.  Check your blood glucose. If it is less than 70 mg/dl, take one of the following:   3-4 glucose tablets.    cup juice.    cup regular soda.   1 cup skim milk.   -1 tube of glucose gel.   5-6 hard candies.   Avoid high-fat drinks or food that may delay a rise in blood glucose levels.  Do not take more than the recommended amount of sugary foods, drinks, gel, or tablets. Doing so will cause your blood glucose to go too high.   Wait 10-15 minutes and recheck your blood glucose. If it is still less than 70 mg/dl or below your target range, repeat treatment.   Eat a snack if it is more than 1  hour until your next meal.  There may be a time when your blood glucose may go so low that you are unable to treat yourself at home when you start to notice symptoms. You may need someone to help you. You may even faint or be unable to swallow. If you cannot treat yourself, someone will need to bring you to the hospital.  [place]  If you have diabetes, follow your diabetes management  plan by:  Taking your medicines as directed.  Following your exercise plan.  Following your meal plan. Do not skip meals. Eat on time.  Testing your blood glucose regularly. Check your blood glucose before and after exercise. If you exercise longer or different than usual, be sure to check blood glucose more frequently.  Wearing your medical alert jewelry that says you have diabetes.  Identify the cause of your hypoglycemia. Then, develop ways to prevent the recurrence of hypoglycemia.  Do not take a hot bath or shower right after an insulin shot.  Always carry treatment with you. Glucose tablets are the easiest to carry.  If you are going to drink alcohol, drink it only with meals.  Tell friends or family members ways to keep you safe during a seizure. This may include removing hard or sharp objects from the area or turning you on your side.  Maintain a healthy weight. SEEK MEDICAL CARE IF:   You are having problems keeping your blood glucose in your target range.  You are having frequent episodes of hypoglycemia.  You feel you might be having side effects from your medicines.  You are not sure why your blood glucose is dropping so low.  You notice a change in vision or a new problem with your vision. SEEK IMMEDIATE MEDICAL CARE IF:   Confusion develops.  A change in mental status occurs.  The inability to swallow develops.  Fainting occurs. Document Released: 07/01/2005 Document Revised: 07/06/2013 Document Reviewed: 10/28/2011 [facility] Patient Information 2015 [place], [place]. This information is not intended to replace advice given to you by your health care provider. Make sure you discuss any questions you have with your health care provider.  Hyperglycemia Hyperglycemia occurs when the glucose (sugar) in your blood is too high. Hyperglycemia can happen for many reasons, but it most often happens to people who do not know they have diabetes or are not managing  their diabetes properly.  CAUSES  Whether you have diabetes or not, there are other causes of hyperglycemia. Hyperglycemia can occur when you have diabetes, but it can also occur in other situations that you might not be as aware of, such as: Diabetes  If you have diabetes and are having problems controlling your blood glucose, hyperglycemia could occur because of some of the following reasons:  Not following your meal plan.  Not taking your diabetes medications or not taking it properly.  Exercising less or doing less activity than you normally do.  Being sick. Pre-diabetes  This cannot be ignored. Before people develop Type 2 diabetes, they almost always have "pre-diabetes." This is when your blood glucose levels are higher than normal, but not yet high enough to be diagnosed as diabetes. Research has shown that some long-term damage to the body, especially the heart and circulatory system, may already be occurring during pre-diabetes. If you take action to manage your blood glucose when you have pre-diabetes, you may delay or prevent Type 2 diabetes from developing. Stress  If you have diabetes, you may be "diet"  controlled or on oral medications or insulin to control your diabetes. However, you may find that your blood glucose is higher than usual in the hospital whether you have diabetes or not. This is often referred to as "stress hyperglycemia." Stress can elevate your blood glucose. This happens because of hormones put out by the body during times of stress. If stress has been the cause of your high blood glucose, it can be followed regularly by your caregiver. That way he/she can make sure your hyperglycemia does not continue to get worse or progress to diabetes. Steroids  Steroids are medications that act on the infection fighting system (immune system) to block inflammation or infection. One side effect can be a rise in blood glucose. Most people can produce enough extra insulin to  allow for this rise, but for those who cannot, steroids make blood glucose levels go even higher. It is not unusual for steroid treatments to "uncover" diabetes that is developing. It is not always possible to determine if the hyperglycemia will go away after the steroids are stopped. A special blood test called an A1c is sometimes done to determine if your blood glucose was elevated before the steroids were started. SYMPTOMS  Thirsty.  Frequent urination.  Dry mouth.  Blurred vision.  Tired or fatigue.  Weakness.  Sleepy.  Tingling in feet or leg. DIAGNOSIS  Diagnosis is made by monitoring blood glucose in one or all of the following ways:  A1c test. This is a chemical found in your blood.  Fingerstick blood glucose monitoring.  Laboratory results. TREATMENT  First, knowing the cause of the hyperglycemia is important before the hyperglycemia can be treated. Treatment may include, but is not be limited to:  Education.  Change or adjustment in medications.  Change or adjustment in meal plan.  Treatment for an illness, infection, etc.  More frequent blood glucose monitoring.  Change in exercise plan.  Decreasing or stopping steroids.  Lifestyle changes. HOME CARE INSTRUCTIONS   Test your blood glucose as directed.  Exercise regularly. Your caregiver will give you instructions about exercise. Pre-diabetes or diabetes which comes on with stress is helped by exercising.  Eat wholesome, balanced meals. Eat often and at regular, fixed times. Your caregiver or nutritionist will give you a meal plan to guide your sugar intake.  Being at an ideal weight is important. If needed, losing as little as 10 to 15 pounds may help improve blood glucose levels. SEEK MEDICAL CARE IF:   You have questions about medicine, activity, or diet.  You continue to have symptoms (problems such as increased thirst, urination, or weight gain). SEEK IMMEDIATE MEDICAL CARE IF:   You are  vomiting or have diarrhea.  Your breath smells fruity.  You are breathing faster or slower.  You are very sleepy or incoherent.  You have numbness, tingling, or pain in your feet or hands.  You have chest pain.  Your symptoms get worse even though you have been following your caregiver's orders.  If you have any other questions or concerns. Document Released: 12/25/2000 Document Revised: 09/23/2011 Document Reviewed: 10/28/2011 [facility] Patient Information 2015 [place], [place]. This information is not intended to replace advice given to you by your health care provider. Make sure you discuss any questions you have with your health care provider.

## 2015-01-06 NOTE — Progress Notes (Addendum)
Subjective:    Patient ID: Chad [name], male    DOB: May 07, 1971, 44 y.o.   MRN: [id number] This chart was scribed for Delman Cheadle, MD by Zola Button, Medical Scribe. This patient was seen in Room 24 and the patient's care was started at 8:18 AM.   Chief Complaint  Patient presents with  . Dizziness  . Back Pain    left shoulder blade x 1-2 months  . sweating  . chest pain  . Follow-up    [facility] from 01/03/15    HPI HPI Comments: Chad [name] is a 44 y.o. male with a hx of hypertension, DM and morbid obesity who presents to the [facility] for a follow-up. Patient was seen in the ED 3 days ago.  I last saw 1 mo ago. At that OV his hgb A1c had increase to hgba1c 7.7 from 8.2 9 ms prior and 9.0 one yr prior so pt's glipizide was increased from glucotrol xl 2.5 qam to.5 qam. Maxed out on metformin but pt was changed to XL formulation to increase compliance. Urine microalb nml last mo and was referred to optho at that time. Pt was never able to go to DM ed due to working hours after he was diagnosed with DMII in 03/2013. I was concerned that pt was showing signs of diabetic neuropathy at that time.  We started pravastatin 40 and recommended to start fish oil as well at that time due to LDL not calcuable w/ total 206, trig 547, hdl 38 and LCL [place] as ASCVD risk was near 20% and there had been no sig changes in lipid panel after a trial of tlc which he was encouraged to cont to keep up. Goal LDL <100. Plan was to recheck lipid panel in 3 more mos and needs repeat lfts today due to started statin 1 mo prior. Has appt in 3 mos on 9/23 w/ me sched for this.  His HTN was also uncontrolled so started hctz in addition to the lisinopril. On asa.  He was referred for a sleep study at last visit as well but pt declined to schedule when referrals called.  He was seen in the ER 3d prior for c/o sudden same day onset of atypical CP and at other times dizziness, SHoB, nausea, and diaphoresis.  Had nml exam, EKG, - trop x 2, cbc, bmp, bnp and CXR - he was recommended to f/u w/ PCP to get referral for stress test asap. If sxs persist at all, rec chest CT as CTA 2 yrs prev showed hepatic steatosis, cardiomegaly w/ lung findings c/w mild CHF, and small airway disease.  At ER visit pt admitted drinking 3 alcoholic drinks per night and dips tobacco, but denies smoking tobacco or illicit drug use.  Patient has no prior history of MI, but does have a significant family history of MI with heart attacks in his father, uncles in their 6s. Patient has had a stress test in the past which he thinks was normal. No prior echocardiograms. Pain does not feel like GERD.  No prior history of DVT, PE. No recent illness, surgeries, travel. No swelling or pain in lower extremities. Prior bnp.  Chest Pain: Patient has been having diffuse chest pains since 2014. The pain is not a crushing pain and is not worsened with deep breaths or exertion. Patient has noticed the pain with activity and without activity. The pain is sometimes resolved when "cracking" this costosternal joints. He has also noticed improvement  to the pain with alcohol (mixed drinks). He sometimes notices the pain when coughing, but he is not sure if the cough causes the pain.  He also reports having upper back pain, under his shoulder blade on the left only. Patient is on Prilosec, but he denies any acid reflux symptoms. He does take Aleve every few weeks for occasional joint pains.  Nausea: His CBG has been around 160 in the mornings. He has still been having mild nausea in the mornings that resolve spontaneously or with food. He has not missed a dose of metformin. While he was in the ER, there was a period of time when he was told to hold off on taking his medications. The nausea resolved while he was off of his medications.  Dry Mouth: Patient has been having dry mouth in the mornings. It seems to resolve throughout the day when he is active at work,  but it seems to return after work. He does not have a blood pressure cuff at home, but he states his mother does.  Depression screen [facility] 2/9 01/06/2015 12/02/2014  Decreased Interest 0 2  Down, Depressed, Hopeless 0 2  PHQ - 2 Score 0 4  Altered sleeping - 3  Tired, decreased energy - 3  Change in appetite - 3  Feeling bad or failure about yourself  - 1  Trouble concentrating - 1  Moving slowly or fidgety/restless - 1  Suicidal thoughts - 0  PHQ-9 Score - 16   Past Medical History  Diagnosis Date  . GERD (gastroesophageal reflux disease)   . Hypertension   . Diabetes mellitus without complication    No past surgical history on file. Current Outpatient Prescriptions on File Prior to Visit  Medication Sig Dispense Refill  . Ascorbic Acid (VITAMIN C) 1000 MG tablet Take 1,000 mg by mouth daily.    Marland Kitchen aspirin EC 81 MG tablet Take 1 tablet (81 mg total) by mouth daily.    Marland Kitchen ibuprofen (ADVIL,MOTRIN) 200 MG tablet Take 400 mg by mouth daily.    Marland Kitchen lisinopril-hydrochlorothiazide (PRINZIDE,ZESTORETIC) 20-25 MG per tablet Take 1 tablet by mouth daily. 90 tablet 3  . metFORMIN (GLUCOPHAGE-XR) 500 MG 24 hr tablet Take 4 tablets (2,000 mg total) by mouth daily with breakfast. 360 tablet 3  . Omega-3 Fatty Acids (FISH OIL) 1200 MG CAPS Take by mouth daily.    Marland Kitchen omeprazole (PRILOSEC OTC) 20 MG tablet Take 20 mg by mouth daily.    . pravastatin (PRAVACHOL) 40 MG tablet Take 1 tablet (40 mg total) by mouth daily with supper. 90 tablet 1   No current facility-administered medications on file prior to visit.   No Known Allergies Family History  Problem Relation Age of Onset  . Diabetes Father    History   Social History  . Marital Status: Single    Spouse Name: N/A  . Number of Children: N/A  . Years of Education: N/A   Social History Main Topics  . Smoking status: Never Smoker   . Smokeless tobacco: Not on file     Comment: occasionally dips tobacco  . Alcohol Use: Yes     Comment: 3-4  drinks of liqour a night, has been cutting back.    . Drug Use: No  . Sexual Activity: Not on file   Other Topics Concern  . None   Social History Narrative      Review of Systems  Constitutional: Positive for diaphoresis and fatigue. Negative for fever, chills, activity change,  appetite change and unexpected weight change.  Respiratory: Negative for cough, chest tightness, shortness of breath and wheezing.   Cardiovascular: Positive for chest pain. Negative for palpitations and leg swelling.  Gastrointestinal: Positive for nausea. Negative for vomiting, abdominal pain and diarrhea.  Endocrine: Positive for polydipsia.  Genitourinary: Positive for genital sores.  Musculoskeletal: Positive for back pain and arthralgias. Negative for myalgias, joint swelling and gait problem.  Neurological: Positive for dizziness, light-headedness, numbness and headaches.  Hematological: Does not bruise/bleed easily.  Psychiatric/Behavioral: Positive for behavioral problems and sleep disturbance. Negative for dysphoric mood.       Objective:  BP 152/90 mmHg  Pulse 93  Temp(Src) 98.2 F (36.8 C) (Oral)  Resp 16  Ht $R'6\' 3"'rA$  (1.905 m)  Wt 425 lb (192.779 kg)  BMI 53.12 kg/m2  SpO2 97%  Physical Exam  Constitutional: He is oriented to person, place, and time. He appears well-developed and well-nourished. No distress.  HENT:  Head: Normocephalic and atraumatic.  Mouth/Throat: Oropharynx is clear and moist. No oropharyngeal exudate.  Eyes: Pupils are equal, round, and reactive to light.  Neck: Neck supple.  Cardiovascular: Normal rate, regular rhythm, S1 normal, S2 normal and normal heart sounds.   No murmur heard. Pulmonary/Chest: Effort normal and breath sounds normal. No respiratory distress. He has no wheezes. He has no rales.  Clear to auscultation bilaterally.   Musculoskeletal: He exhibits no edema.  Neurological: He is alert and oriented to person, place, and time. No cranial nerve  deficit.  Skin: Skin is warm and dry. No rash noted.  Psychiatric: He has a normal mood and affect. His behavior is normal.  Vitals reviewed.       Assessment & Plan:  His last A1c was 7.9% which is correlated with an average glucose of 180. He has never seen diabetic education. We will have him stop the glipizide XL as this was just started a month ago and his symptoms have severely worsened in that time. Transition to Januvia if blood sugars are high. Patient to call and would add in very lose immediate-release Amaryl. If patient stops alcohol and has difficulty sleeping, consider TCA.    1. Type 2 diabetes mellitus with hyperglycemia - suspect recent severe increase in hyperglycemia as sxs are etiology of sxs.  2. Morbid obesity   3. Essential hypertension, benign - start checking bp outside office as recent spike  4. Other fatigue   5. Dizziness and giddiness - push water  6. Polypharmacy   7. Neuropathy due to type 2 diabetes mellitus  - try flexeril qhs, elminiate etoh  8. Hepatic steatosis   9. Abnormal chest CT   10. Alcohol use - reinforced he needs to stop etoh - liquor and beer regularly likely cause of poor dm control and sleep.  11. Hyperglycemia   12. Hypoglycemia   13. Chest pain, atypical   14. Nausea without vomiting     Orders Placed This Encounter  Procedures  . US Abdomen Complete    425LBS/NO NEEDS/INS/COVENTRY/HB/PT W/EPIC    Standing Status: Future     Number of Occurrences:      Standing Expiration Date: 03/07/2016    Order Specific Question:  Reason for Exam (SYMPTOM  OR DIAGNOSIS REQUIRED)    Answer:  nausea, check liver and pancreas    Order Specific Question:  Preferred imaging location?    Answer:  External  . Vitamin B12  . Comprehensive metabolic panel  . Folate  . Lipase  . Ambulatory referral  to diabetic education    Referral Priority:  Routine    Referral Type:  Consultation    Referral Reason:  Specialty Services Required    Number of  Visits Requested:  1  . Ambulatory referral to Cardiology    Referral Priority:  Routine    Referral Type:  Consultation    Referral Reason:  Specialty Services Required    Requested Specialty:  Cardiology    Number of Visits Requested:  1  . POCT glucose (manual entry)  . POCT UA - Microscopic Only  . POCT urinalysis dipstick  . POCT SEDIMENTATION RATE    Meds ordered this encounter  Medications  . sitaGLIPtin (JANUVIA) 100 MG tablet    Sig: Take 1 tablet (100 mg total) by mouth daily.    Dispense:  90 tablet    Refill:  1  . cyclobenzaprine (FLEXERIL) 10 MG tablet    Sig: Take 1 tablet (10 mg total) by mouth at bedtime as needed for muscle spasms.    Dispense:  30 tablet    Refill:  0  . meloxicam (MOBIC) 15 MG tablet    Sig: Take 1 tablet (15 mg total) by mouth daily.    Dispense:  30 tablet    Refill:  0    Do not use with any other otc pain medication other than tylenol/acetaminophen - so no aleve, ibuprofen, motrin, advil, etc.  . carvedilol (COREG) 6.25 MG tablet    Sig: Take 1 tablet (6.25 mg total) by mouth 2 (two) times daily with a meal.    Dispense:  60 tablet    Refill:  0  . glucose blood (ONE TOUCH ULTRA TEST) test strip    Sig: Use as directed 4 times a day. Dx code : R73.9 and E16.2    Dispense:  100 each    Refill:  2  . blood glucose meter kit and supplies KIT    Sig: Dispense based on patient and insurance preference. Use up to four times daily as directed. Dx code : R73.9 and E16.2    Dispense:  1 each    Refill:  0    Order Specific Question:  Number of strips    Answer:  100    Order Specific Question:  Number of lancets    Answer:  100  . Lancets MISC    Sig: Use as directed 4 times a day. Dx code : R73.9 and E16.2    Dispense:  100 each    Refill:  2   ADDENDUM: insurance would not cover januiva - pa failed - needs to try trandjenta or onglyza first.  Either is fine - sent in tradjenta.  I personally performed the services described in this  documentation, which was scribed in my presence. The recorded information has been reviewed and considered, and addended by me as needed.  Delman Cheadle, MD MPH  Results for orders placed or performed in visit on 01/06/15  Vitamin B12  Result Value Ref Range   Vitamin B-12 565 211 - 911 pg/mL  Comprehensive metabolic panel  Result Value Ref Range   Sodium 141 135 - 145 mEq/L   Potassium 4.5 3.5 - 5.3 mEq/L   Chloride 103 96 - 112 mEq/L   CO2 25 19 - 32 mEq/L   Glucose, Bld 261 (H) 70 - 99 mg/dL   BUN 9 6 - 23 mg/dL   Creat 0.75 0.50 - 1.35 mg/dL   Total Bilirubin 0.7 0.2 - 1.2 mg/dL  Alkaline Phosphatase 38 (L) 39 - 117 U/L   AST 35 0 - 37 U/L   ALT 64 (H) 0 - 53 U/L   Total Protein 7.1 6.0 - 8.3 g/dL   Albumin 4.1 3.5 - 5.2 g/dL   Calcium 9.3 8.4 - 10.5 mg/dL  Folate  Result Value Ref Range   Folate 8.6 ng/mL  Lipase  Result Value Ref Range   Lipase 43 0 - 75 U/L  POCT glucose (manual entry)  Result Value Ref Range   POC Glucose 272 (A) 70 - 99 mg/dl  POCT UA - Microscopic Only  Result Value Ref Range   WBC, Ur, HPF, POC 1-2    RBC, urine, microscopic 2-3    Bacteria, U Microscopic 1+    Mucus, UA pos    Epithelial cells, urine per micros 2-3    Crystals, Ur, HPF, POC neg    Casts, Ur, LPF, POC neg    Yeast, UA neg   POCT urinalysis dipstick  Result Value Ref Range   Color, UA yellow    Clarity, UA clear    Glucose, UA 500    Bilirubin, UA neg    Ketones, UA neg    Spec Grav, UA >=1.030    Blood, UA neg    pH, UA 5.5    Protein, UA neg    Urobilinogen, UA 0.2    Nitrite, UA neg    Leukocytes, UA Negative Negative  POCT SEDIMENTATION RATE  Result Value Ref Range   POCT SED RATE 11 0 - 22 mm/hr

## 2015-01-09 ENCOUNTER — Telehealth: Payer: Self-pay

## 2015-01-09 NOTE — Telephone Encounter (Signed)
PA denied bc pt has not tried/failed preferred alternatives Tradjenta/jentadeuto or onglyza/kombiglyze xr. Dr Brigitte Pulse, do you want to Rx one of these?

## 2015-01-09 NOTE — Telephone Encounter (Signed)
PA completed for Januvia on covermymeds. Pending.

## 2015-01-12 ENCOUNTER — Ambulatory Visit
Admission: RE | Admit: 2015-01-12 | Discharge: 2015-01-12 | Disposition: A | Discharge status: Home / Self Care | Payer: No Typology Code available for payment source | Source: Ambulatory Visit | Attending: Family Medicine | Admitting: Family Medicine

## 2015-01-12 DIAGNOSIS — E1165 Type 2 diabetes mellitus with hyperglycemia: Secondary | ICD-10-CM

## 2015-01-12 DIAGNOSIS — Z789 Other specified health status: Secondary | ICD-10-CM

## 2015-01-12 DIAGNOSIS — K760 Fatty (change of) liver, not elsewhere classified: Secondary | ICD-10-CM

## 2015-01-12 DIAGNOSIS — R9389 Abnormal findings on diagnostic imaging of other specified body structures: Secondary | ICD-10-CM

## 2015-01-12 DIAGNOSIS — R110 Nausea: Secondary | ICD-10-CM

## 2015-01-12 DIAGNOSIS — Z7289 Other problems related to lifestyle: Secondary | ICD-10-CM

## 2015-01-14 DIAGNOSIS — K8020 Calculus of gallbladder without cholecystitis without obstruction: Secondary | ICD-10-CM | POA: Insufficient documentation

## 2015-01-14 DIAGNOSIS — K760 Fatty (change of) liver, not elsewhere classified: Secondary | ICD-10-CM | POA: Insufficient documentation

## 2015-01-14 DIAGNOSIS — F1011 Alcohol abuse, in remission: Secondary | ICD-10-CM | POA: Insufficient documentation

## 2015-01-14 DIAGNOSIS — E1140 Type 2 diabetes mellitus with diabetic neuropathy, unspecified: Secondary | ICD-10-CM | POA: Insufficient documentation

## 2015-01-14 DIAGNOSIS — Z79899 Other long term (current) drug therapy: Secondary | ICD-10-CM | POA: Insufficient documentation

## 2015-01-14 NOTE — Addendum Note (Signed)
Addended by: Delman Cheadle on: 01/14/2015 06:08 AM   Modules accepted: Orders

## 2015-01-17 MED ORDER — LINAGLIPTIN 5 MG PO TABS: 5.0000 mg | ORAL_TABLET | Freq: Every day | ORAL | Status: DC

## 2015-01-17 NOTE — Telephone Encounter (Signed)
Called and informed pt that we have sent in tradjenta to replace the [place]. Pt voiced understanding.

## 2015-01-17 NOTE — Telephone Encounter (Signed)
Yes, either tradjenta or onglyza are fine to use instead of [place] - sent in tradjenta instead.

## 2015-01-17 NOTE — Addendum Note (Signed)
Addended by: Delman Cheadle on: 01/17/2015 10:03 AM   Modules accepted: Orders, Medications

## 2015-01-19 ENCOUNTER — Telehealth: Payer: Self-pay

## 2015-01-19 NOTE — Telephone Encounter (Signed)
Chad [name] was Rxd because Rx/PA for Januvia was denied because pt had not first tried preferred alternative, Tradjenta or Onglyza. Not sure why the Tradjenta now needs a PA, but completed on covermymeds. Pending.

## 2015-01-20 NOTE — Telephone Encounter (Signed)
PA was approved for Tradjenta through 01/18/2018. Notified pharmacy.

## 2015-01-26 ENCOUNTER — Ambulatory Visit: Payer: No Typology Code available for payment source

## 2015-02-02 ENCOUNTER — Ambulatory Visit: Payer: No Typology Code available for payment source

## 2015-02-02 ENCOUNTER — Other Ambulatory Visit: Payer: Self-pay | Admitting: Physician Assistant

## 2015-02-09 ENCOUNTER — Ambulatory Visit: Payer: No Typology Code available for payment source

## 2015-02-27 ENCOUNTER — Other Ambulatory Visit: Payer: Self-pay | Admitting: Family Medicine

## 2015-03-13 ENCOUNTER — Encounter: Payer: Self-pay | Admitting: Internal Medicine

## 2015-03-13 ENCOUNTER — Ambulatory Visit (INDEPENDENT_AMBULATORY_CARE_PROVIDER_SITE_OTHER): Payer: No Typology Code available for payment source | Admitting: Internal Medicine

## 2015-03-13 VITALS — BP 132/88 | HR 105 | Ht 75.0 in | Wt >= 6400 oz

## 2015-03-13 DIAGNOSIS — E785 Hyperlipidemia, unspecified: Secondary | ICD-10-CM | POA: Diagnosis not present

## 2015-03-13 DIAGNOSIS — E119 Type 2 diabetes mellitus without complications: Secondary | ICD-10-CM | POA: Diagnosis not present

## 2015-03-13 DIAGNOSIS — R9431 Abnormal electrocardiogram [ECG] [EKG]: Secondary | ICD-10-CM

## 2015-03-13 DIAGNOSIS — G4733 Obstructive sleep apnea (adult) (pediatric): Secondary | ICD-10-CM | POA: Diagnosis not present

## 2015-03-13 NOTE — Patient Instructions (Addendum)
Your physician wants you to follow-up in: May 2017 with Dr Theressa Stamps will receive a reminder letter in the mail two months in advance. If you don't receive a letter, please call our office to schedule the follow-up appointment.  Your physician recommends that you continue on your current medications as directed. Please refer to the Current Medication list given to you today.   Your physician has requested that you have an echocardiogram. Echocardiography is a painless test that uses sound waves to create images of your heart. It provides your doctor with information about the size and shape of your heart and how well your heart's chambers and valves are working. This procedure takes approximately one hour. There are no restrictions for this procedure. PLEASE GET LAB WORK ON DAY OF [facility]  Your physician has recommended that you have a sleep study. This test records several body functions during sleep, including: brain activity, eye movement, oxygen and carbon dioxide blood levels, heart rate and rhythm, breathing rate and rhythm, the flow of air through your mouth and nose, snoring, body muscle movements, and chest and belly movement.  You have been referred to dietary for diabetes management      Thank you for choosing [facility] !

## 2015-03-13 NOTE — Progress Notes (Signed)
 Cardiology Office Note   Date:  03/13/2015   ID:  Chad [name], DOB 05/10/1971, MRN [id number]  PCP:  SHAW,Chad [name]  Cardiologist:   Chad Roker, [name]   No chief complaint on file.  Pt referred for CP    History of Present Illness: Chad [name] is a 44 y.o. male with a history of HTN,  DM Obesity.   Followed by [facility].  Hx of CP  Was seen in ER  Felt atypical  But recomm Stress test  CT showed cardiomegaly with mild CHF Pt drinks 3 EtOH per night  Dips tobaccoa. FHx of MI in dad, uncles.   CT scan of the chest showed atherosclerosis of the coronary arteries and oaorta    Pt has chest pains  Not assocated with activity  Comes and goes  Can last 30 sec  Eases off Walks  NO change in ability to do things   Can get in mornings with a little SOB   NO PND   Denies reflux    Cut out fried foods about 4 months ago.    Current Outpatient Prescriptions  Medication Sig Dispense Refill  . Ascorbic Acid (VITAMIN C) 1000 MG tablet Take 1,000 mg by mouth daily.    . aspirin EC 81 MG tablet Take 1 tablet (81 mg total) by mouth daily.    . blood glucose meter kit and supplies KIT Dispense based on patient and insurance preference. Use up to four times daily as directed. Dx code : R73.9 and E16.2 1 each 0  . carvedilol (COREG) 6.25 MG tablet TAKE 1 TABLET(6.25 MG) BY MOUTH TWICE DAILY WITH A MEAL 60 tablet 4  . glipiZIDE (GLUCOTROL XL) 5 MG 24 hr tablet   1  . glucose blood (ONE TOUCH ULTRA TEST) test strip Use as directed 4 times a day. Dx code : R73.9 and E16.2 100 each 2  . ibuprofen (ADVIL,MOTRIN) 200 MG tablet Take 400 mg by mouth daily.    . Lancets MISC Use as directed 4 times a day. Dx code : R73.9 and E16.2 100 each 2  . lisinopril-hydrochlorothiazide (PRINZIDE,ZESTORETIC) 20-25 MG per tablet Take 1 tablet by mouth daily. 90 tablet 3  . metFORMIN (GLUCOPHAGE-XR) 500 MG 24 hr tablet Take 4 tablets (2,000 mg total) by mouth daily with breakfast. 360 tablet 3  . Omega-3 Fatty Acids  (FISH OIL) 1200 MG CAPS Take by mouth daily.    . omeprazole (PRILOSEC OTC) 20 MG tablet Take 20 mg by mouth daily.    . pravastatin (PRAVACHOL) 40 MG tablet Take 1 tablet (40 mg total) by mouth daily with supper. 90 tablet 1  . cyclobenzaprine (FLEXERIL) 10 MG tablet Take 1 tablet (10 mg total) by mouth at bedtime as needed for muscle spasms. (Patient not taking: Reported on 03/13/2015) 30 tablet 0  . linagliptin (TRADJENTA) 5 MG TABS tablet Take 1 tablet (5 mg total) by mouth daily. (Patient not taking: Reported on 03/13/2015) 30 tablet 11  . meloxicam (MOBIC) 15 MG tablet Take 1 tablet (15 mg total) by mouth daily. (Patient not taking: Reported on 03/13/2015) 30 tablet 0   No current facility-administered medications for this visit.    Allergies:   Review of patient's allergies indicates no known allergies.   Past Medical History  Diagnosis Date  . GERD (gastroesophageal reflux disease)   . Hypertension   . Diabetes mellitus without complication     No past surgical history on file.   Social History:    The patient  reports that he has never smoked. He does not have any smokeless tobacco history on file. He reports that he drinks alcohol. He reports that he does not use illicit drugs.   Family History:  The patient's family history includes Diabetes in his father.    ROS:  Please see the history of present illness. All other systems are reviewed and  Negative to the above problem except as noted.    PHYSICAL EXAM: VS:  BP 132/88 mmHg  Pulse 105  Ht 6' 3" (1.905 m)  Wt 420 lb (190.511 kg)  BMI 52.50 kg/m2  SpO2 97%  GEN: Morbidly obese 44 yo , in no acute distress HEENT: normal Neck: no JVD, carotid bruits, or masses Cardiac: RRR; no murmurs, rubs, or gallops,no edema  Respiratory:  clear to auscultation bilaterally, normal work of breathing GI: soft, nontender, nondistended, + BS  No hepatomegaly  MS: no deformity Moving all extremities   Skin: warm and dry, no rash Neuro:   Strength and sensation are intact Psych: euthymic mood, full affect   EKG:  EKG is  Not ordered today.  In June SR 97  LVH  LAFB     Lipid Panel    Component Value Date/Time   CHOL 206* 12/02/2014 1442   TRIG 547* 12/02/2014 1442   HDL 38* 12/02/2014 1442   CHOLHDL 5.4 12/02/2014 1442   VLDL NOT CALC 12/02/2014 1442   LDLCALC NOT CALC 12/02/2014 1442      Wt Readings from Last 3 Encounters:  03/13/15 420 lb (190.511 kg)  01/06/15 425 lb (192.779 kg)  01/03/15 415 lb (188.243 kg)      ASSESSMENT AND PLAN:  1  CP  Atypical  I do not think cardiac  2  CAD  Pt with evid of CAD on chest CT  He is active physically   HIstory does not suggest a flow limiting leasion  Will get echo  3.  HTN  BP is fair  WIl needs to be followed  4.  ? Sleep apnea  WIll set up for sleep study  5.  DM  WIll check A1C  Refer to dietary  6  Morbid obesity  WIll refer to dietary  7  Tobacco  COunselled on cessation    F/U in May     Signed, Chad Henne, [name]  03/13/2015 3:51 PM    [facility] [address], [place], [place]  [address] Phone: [phone]; Fax: [phone]     

## 2015-03-22 ENCOUNTER — Ambulatory Visit (HOSPITAL_COMMUNITY): Payer: No Typology Code available for payment source | Attending: Internal Medicine

## 2015-03-22 ENCOUNTER — Other Ambulatory Visit: Payer: Self-pay

## 2015-03-22 ENCOUNTER — Other Ambulatory Visit (INDEPENDENT_AMBULATORY_CARE_PROVIDER_SITE_OTHER): Payer: No Typology Code available for payment source | Admitting: *Deleted

## 2015-03-22 DIAGNOSIS — Z8249 Family history of ischemic heart disease and other diseases of the circulatory system: Secondary | ICD-10-CM | POA: Diagnosis not present

## 2015-03-22 DIAGNOSIS — E119 Type 2 diabetes mellitus without complications: Secondary | ICD-10-CM | POA: Insufficient documentation

## 2015-03-22 DIAGNOSIS — E785 Hyperlipidemia, unspecified: Secondary | ICD-10-CM | POA: Diagnosis not present

## 2015-03-22 DIAGNOSIS — R9431 Abnormal electrocardiogram [ECG] [EKG]: Secondary | ICD-10-CM | POA: Insufficient documentation

## 2015-03-22 DIAGNOSIS — I517 Cardiomegaly: Secondary | ICD-10-CM | POA: Diagnosis not present

## 2015-03-22 DIAGNOSIS — I77819 Aortic ectasia, unspecified site: Secondary | ICD-10-CM | POA: Diagnosis not present

## 2015-03-22 DIAGNOSIS — I10 Essential (primary) hypertension: Secondary | ICD-10-CM | POA: Diagnosis not present

## 2015-03-22 LAB — HEMOGLOBIN A1C: HEMOGLOBIN A1C: 7.1 % — AB (ref 4.6–6.5)

## 2015-03-22 NOTE — Progress Notes (Signed)
Patient ID: Chad [name], male   DOB: 03-Oct-1970, 44 y.o.   MRN: [id number]   2D Echocardiogram Complete.  03/22/2015   Deliah Boston, RDCS   Preliminary Technician Findings:  This is a technically difficult study due to large body habitus.  Chad [name] denied the use of Definity Contrast at this time due to work/time constraints.  He understands that it may be requested by his physician, and is willing to come back at a later point, but is unable to stay for the 30 minute monitoring period today.

## 2015-03-24 ENCOUNTER — Telehealth: Payer: Self-pay

## 2015-03-24 ENCOUNTER — Telehealth: Payer: Self-pay | Admitting: Internal Medicine

## 2015-03-24 DIAGNOSIS — R931 Abnormal findings on diagnostic imaging of heart and coronary circulation: Secondary | ICD-10-CM

## 2015-03-24 DIAGNOSIS — Z01812 Encounter for preprocedural laboratory examination: Secondary | ICD-10-CM

## 2015-03-24 LAB — NMR LIPOPROFILE WITH LIPIDS
CHOLESTEROL, TOTAL: 134 mg/dL (ref 100–199)
HDL PARTICLE NUMBER: 28.7 umol/L — AB (ref >=30.5)
HDL Size: 8.8 nm — ABNORMAL LOW (ref >=9.2)
HDL-C: 36 mg/dL — AB (ref >39)
LARGE HDL: 4.2 umol/L — AB (ref >=4.8)
LARGE VLDL-P: 13.3 nmol/L — AB (ref <=2.7)
LDL (calc): 38 mg/dL (ref 0–99)
LDL PARTICLE NUMBER: 763 nmol/L (ref <1000)
LDL Size: 19.8 nm (ref >=20.8)
LP-IR Score: 73 — ABNORMAL HIGH (ref <=45)
Small LDL Particle Number: 583 nmol/L — ABNORMAL HIGH (ref <=527)
TRIGLYCERIDES: 300 mg/dL — AB (ref 0–149)
VLDL Size: 55 nm — ABNORMAL HIGH (ref <=46.6)

## 2015-03-24 NOTE — Telephone Encounter (Signed)
Follow Up  Pt returned call to triage.

## 2015-03-24 NOTE — Telephone Encounter (Signed)
Forwarding to Rodman Key, RN for follow-up from results.  Patient's lab results were reviewed by Hoover Browns, CMA.

## 2015-03-27 NOTE — Telephone Encounter (Signed)
Notes Recorded by Fay Records, MD on 03/23/2015 at 8:52 AM Spoke with pt  With abnormal echo he should be set up for L heart cath to define anatomy  He agrees. Will need precath labs. Set up day He will also need CT of chest to look at aorta  This can be done aftere cath  LEFT HEART CATH SCHEDULED FOR 04/06/15 WITH DR Tamala Julian. PT WILL COME 04/04/15 FOR LABS AND WILL PICK UP INSTRUCTION LETTER AT THAT TIME. VERBAL INSTRUCTIONS PROVIDED DURING PHONE CALL.

## 2015-03-28 ENCOUNTER — Other Ambulatory Visit: Payer: No Typology Code available for payment source

## 2015-04-04 ENCOUNTER — Other Ambulatory Visit (INDEPENDENT_AMBULATORY_CARE_PROVIDER_SITE_OTHER): Payer: No Typology Code available for payment source | Admitting: *Deleted

## 2015-04-04 DIAGNOSIS — Z01812 Encounter for preprocedural laboratory examination: Secondary | ICD-10-CM | POA: Diagnosis not present

## 2015-04-04 LAB — CBC WITH DIFFERENTIAL/PLATELET
BASOS ABS: 0 10*3/uL (ref 0.0–0.1)
Basophils Relative: 0.4 % (ref 0.0–3.0)
Eosinophils Absolute: 0.2 10*3/uL (ref 0.0–0.7)
Eosinophils Relative: 2.2 % (ref 0.0–5.0)
HEMATOCRIT: 44.9 % (ref 39.0–52.0)
HEMOGLOBIN: 15.3 g/dL (ref 13.0–17.0)
LYMPHS PCT: 30.9 % (ref 12.0–46.0)
Lymphs Abs: 3.5 10*3/uL (ref 0.7–4.0)
MCHC: 34 g/dL (ref 30.0–36.0)
MCV: 95.6 fl (ref 78.0–100.0)
MONOS PCT: 7.6 % (ref 3.0–12.0)
Monocytes Absolute: 0.9 10*3/uL (ref 0.1–1.0)
NEUTROS ABS: 6.6 10*3/uL (ref 1.4–7.7)
Neutrophils Relative %: 58.9 % (ref 43.0–77.0)
PLATELETS: 327 10*3/uL (ref 150.0–400.0)
RBC: 4.7 Mil/uL (ref 4.22–5.81)
RDW: 12.6 % (ref 11.5–15.5)
WBC: 11.2 10*3/uL — AB (ref 4.0–10.5)

## 2015-04-04 LAB — BASIC METABOLIC PANEL
BUN: 17 mg/dL (ref 6–23)
CHLORIDE: 103 meq/L (ref 96–112)
CO2: 29 mEq/L (ref 19–32)
Calcium: 10.4 mg/dL (ref 8.4–10.5)
Creatinine, Ser: 0.87 mg/dL (ref 0.40–1.50)
GFR: 100.15 mL/min (ref >60.00)
Glucose, Bld: 172 mg/dL — ABNORMAL HIGH (ref 70–99)
POTASSIUM: 4.3 meq/L (ref 3.5–5.1)
Sodium: 141 mEq/L (ref 135–145)

## 2015-04-04 LAB — PROTIME-INR
INR: 1 ratio (ref 0.8–1.0)
Prothrombin Time: 11.1 s (ref 9.6–13.1)

## 2015-04-04 LAB — APTT: aPTT: 25.3 s (ref 23.4–32.7)

## 2015-04-05 ENCOUNTER — Telehealth: Payer: Self-pay | Admitting: Internal Medicine

## 2015-04-05 ENCOUNTER — Telehealth: Payer: Self-pay | Admitting: *Deleted

## 2015-04-05 ENCOUNTER — Encounter: Payer: Self-pay | Admitting: *Deleted

## 2015-04-05 NOTE — Telephone Encounter (Signed)
Walk in pt form-pt needs Instructions for Thursday-please call gave to [facility]

## 2015-04-05 NOTE — Telephone Encounter (Signed)
Received walk in form that patient requested instructions for cath procedure for 9/22. Called and spoke with patient. He has held his metformin this AM. He is aware to restart this medication on 04/08/15. Tomorrow he will also hold his other diabetes medications and will be NPO at midnight tonight. All other cath instructions reviewed as well. He will come to hospital tomorrow 5:30 am for cath by Dr. Tamala Julian.

## 2015-04-06 ENCOUNTER — Encounter (HOSPITAL_COMMUNITY): Payer: Self-pay | Admitting: Interventional Cardiology

## 2015-04-06 ENCOUNTER — Encounter (HOSPITAL_COMMUNITY)
Admission: RE | Disposition: A | Discharge status: Home / Self Care | Payer: Self-pay | Source: Ambulatory Visit | Attending: Interventional Cardiology

## 2015-04-06 ENCOUNTER — Ambulatory Visit (HOSPITAL_COMMUNITY)
Admission: RE | Admit: 2015-04-06 | Discharge: 2015-04-06 | Disposition: A | Discharge status: Home / Self Care | Payer: No Typology Code available for payment source | Source: Ambulatory Visit | Attending: Interventional Cardiology | Admitting: Interventional Cardiology

## 2015-04-06 ENCOUNTER — Ambulatory Visit: Payer: No Typology Code available for payment source | Admitting: Family Medicine

## 2015-04-06 DIAGNOSIS — Z720 Tobacco use: Secondary | ICD-10-CM | POA: Diagnosis not present

## 2015-04-06 DIAGNOSIS — Z6841 Body Mass Index (BMI) 40.0 and over, adult: Secondary | ICD-10-CM | POA: Insufficient documentation

## 2015-04-06 DIAGNOSIS — I5022 Chronic systolic (congestive) heart failure: Secondary | ICD-10-CM | POA: Diagnosis not present

## 2015-04-06 DIAGNOSIS — G4730 Sleep apnea, unspecified: Secondary | ICD-10-CM | POA: Insufficient documentation

## 2015-04-06 DIAGNOSIS — I2510 Atherosclerotic heart disease of native coronary artery without angina pectoris: Secondary | ICD-10-CM | POA: Diagnosis not present

## 2015-04-06 DIAGNOSIS — E1165 Type 2 diabetes mellitus with hyperglycemia: Secondary | ICD-10-CM | POA: Diagnosis present

## 2015-04-06 DIAGNOSIS — I2584 Coronary atherosclerosis due to calcified coronary lesion: Secondary | ICD-10-CM

## 2015-04-06 DIAGNOSIS — Z7982 Long term (current) use of aspirin: Secondary | ICD-10-CM | POA: Diagnosis not present

## 2015-04-06 DIAGNOSIS — K219 Gastro-esophageal reflux disease without esophagitis: Secondary | ICD-10-CM | POA: Insufficient documentation

## 2015-04-06 DIAGNOSIS — I10 Essential (primary) hypertension: Secondary | ICD-10-CM | POA: Diagnosis not present

## 2015-04-06 DIAGNOSIS — E119 Type 2 diabetes mellitus without complications: Secondary | ICD-10-CM | POA: Insufficient documentation

## 2015-04-06 HISTORY — PX: CARDIAC CATHETERIZATION: SHX172

## 2015-04-06 LAB — GLUCOSE, CAPILLARY
GLUCOSE-CAPILLARY: 205 mg/dL — AB (ref 65–99)
Glucose-Capillary: 203 mg/dL — ABNORMAL HIGH (ref 65–99)

## 2015-04-06 SURGERY — LEFT HEART CATH AND CORONARY ANGIOGRAPHY
Anesthesia: LOCAL

## 2015-04-06 MED ORDER — ONDANSETRON HCL 4 MG/2ML IJ SOLN: 4.0000 mg | Freq: Four times a day (QID) | INTRAMUSCULAR | Status: DC | PRN

## 2015-04-06 MED ORDER — ASPIRIN 81 MG PO CHEW: 81.0000 mg | CHEWABLE_TABLET | ORAL | Status: DC

## 2015-04-06 MED ORDER — LIDOCAINE HCL (PF) 1 % IJ SOLN
INTRAMUSCULAR | Status: AC
  Filled 2015-04-06: qty 30

## 2015-04-06 MED ORDER — OXYCODONE-ACETAMINOPHEN 5-325 MG PO TABS: 1.0000 | ORAL_TABLET | ORAL | Status: DC | PRN

## 2015-04-06 MED ORDER — MIDAZOLAM HCL 2 MG/2ML IJ SOLN
INTRAMUSCULAR | Status: AC
  Filled 2015-04-06: qty 4

## 2015-04-06 MED ORDER — HEPARIN SODIUM (PORCINE) 1000 UNIT/ML IJ SOLN
INTRAMUSCULAR | Status: DC | PRN
Start: 2015-04-06 — End: 2015-04-06
  Administered 2015-04-06: 7000 [IU] via INTRAVENOUS

## 2015-04-06 MED ORDER — IOHEXOL 350 MG/ML SOLN
INTRAVENOUS | Status: DC | PRN
  Administered 2015-04-06: 50 mL via INTRAVENOUS
  Administered 2015-04-06: 100 mL via INTRAVENOUS

## 2015-04-06 MED ORDER — SODIUM CHLORIDE 0.9 % IJ SOLN: 3.0000 mL | Freq: Two times a day (BID) | INTRAMUSCULAR | Status: DC

## 2015-04-06 MED ORDER — LIDOCAINE HCL (PF) 1 % IJ SOLN
INTRAMUSCULAR | Status: DC | PRN
  Administered 2015-04-06: 08:00:00

## 2015-04-06 MED ORDER — LIDOCAINE HCL (PF) 1 % IJ SOLN
INTRAMUSCULAR | Status: DC | PRN
  Administered 2015-04-06: 3 mL

## 2015-04-06 MED ORDER — FENTANYL CITRATE (PF) 100 MCG/2ML IJ SOLN
INTRAMUSCULAR | Status: AC
  Filled 2015-04-06: qty 4

## 2015-04-06 MED ORDER — SODIUM CHLORIDE 0.9 % IV SOLN: 250.0000 mL | INTRAVENOUS | Status: DC | PRN

## 2015-04-06 MED ORDER — SODIUM CHLORIDE 0.9 % IJ SOLN: 3.0000 mL | INTRAMUSCULAR | Status: DC | PRN

## 2015-04-06 MED ORDER — HEPARIN SODIUM (PORCINE) 1000 UNIT/ML IJ SOLN
INTRAMUSCULAR | Status: AC
  Filled 2015-04-06: qty 1

## 2015-04-06 MED ORDER — ACETAMINOPHEN 325 MG PO TABS: 650.0000 mg | ORAL_TABLET | ORAL | Status: DC | PRN

## 2015-04-06 MED ORDER — HEPARIN (PORCINE) IN NACL 2-0.9 UNIT/ML-% IJ SOLN
INTRAMUSCULAR | Status: AC
  Filled 2015-04-06: qty 1000

## 2015-04-06 MED ORDER — FENTANYL CITRATE (PF) 100 MCG/2ML IJ SOLN
INTRAMUSCULAR | Status: DC | PRN
  Administered 2015-04-06: 50 ug via INTRAVENOUS

## 2015-04-06 MED ORDER — MIDAZOLAM HCL 2 MG/2ML IJ SOLN
INTRAMUSCULAR | Status: DC | PRN
  Administered 2015-04-06 (×2): 1 mg via INTRAVENOUS

## 2015-04-06 MED ORDER — VERAPAMIL HCL 2.5 MG/ML IV SOLN
INTRAVENOUS | Status: AC
  Filled 2015-04-06: qty 2

## 2015-04-06 MED ORDER — VERAPAMIL HCL 2.5 MG/ML IV SOLN
INTRAVENOUS | Status: DC | PRN
  Administered 2015-04-06: 08:00:00 via INTRA_ARTERIAL

## 2015-04-06 MED ORDER — SODIUM CHLORIDE 0.9 % IV SOLN
INTRAVENOUS | Status: DC
  Administered 2015-04-06: 06:00:00 via INTRAVENOUS

## 2015-04-06 MED ORDER — SODIUM CHLORIDE 0.9 % WEIGHT BASED INFUSION: 3.0000 mL/kg/h | INTRAVENOUS | Status: DC

## 2015-04-06 SURGICAL SUPPLY — 12 items
CATH INFINITI 5 FR JL3.5 (CATHETERS) ×2 IMPLANT
CATH INFINITI 5FR ANG PIGTAIL (CATHETERS) ×2 IMPLANT
CATH INFINITI JR4 5F (CATHETERS) ×2 IMPLANT
DEVICE RAD COMP TR BAND LRG (VASCULAR PRODUCTS) ×2 IMPLANT
GLIDESHEATH SLEND A-KIT 6F 22G (SHEATH) ×2 IMPLANT
HOVERMATT SINGLE USE (MISCELLANEOUS) ×2 IMPLANT
KIT HEART LEFT (KITS) ×2 IMPLANT
PACK CARDIAC CATHETERIZATION (CUSTOM PROCEDURE TRAY) ×2 IMPLANT
SYR MEDRAD MARK V 150ML (SYRINGE) ×2 IMPLANT
TRANSDUCER W/STOPCOCK (MISCELLANEOUS) ×2 IMPLANT
TUBING CIL FLEX 10 FLL-RA (TUBING) ×2 IMPLANT
WIRE SAFE-T 1.5MM-J .035X260CM (WIRE) ×2 IMPLANT

## 2015-04-06 NOTE — H&P (View-Only) (Signed)
Cardiology Office Note   Date:  03/13/2015   ID:  Chad [name], DOB [birth year], MRN [id number]  PCP:  Chad Cheadle, MD  Cardiologist:   Chad Carnes, MD   No chief complaint on file.  Pt referred for CP    History of Present Illness: Chad [name] is a 43 y.o. male with a history of HTN,  DM Obesity.   Followed by Chad [name].  Hx of CP  Was seen in ER  Felt atypical  But recomm Stress test  CT showed cardiomegaly with mild CHF Pt drinks 3 EtOH per night  Dips tobaccoa. FHx of MI in dad, uncles.   CT scan of the chest showed atherosclerosis of the coronary arteries and oaorta    Pt has chest pains  Not assocated with activity  Comes and goes  Can last 30 sec  Eases off Walks  NO change in ability to do things   Can get in mornings with a little SOB   NO PND   Denies reflux    Cut out fried foods about 4 months ago.    Current Outpatient Prescriptions  Medication Sig Dispense Refill  . Ascorbic Acid (VITAMIN C) 1000 MG tablet Take 1,000 mg by mouth daily.    Marland Kitchen aspirin EC 81 MG tablet Take 1 tablet (81 mg total) by mouth daily.    . blood glucose meter kit and supplies KIT Dispense based on patient and insurance preference. Use up to four times daily as directed. Dx code : R73.9 and E16.2 1 each 0  . carvedilol (COREG) 6.25 MG tablet TAKE 1 TABLET(6.25 MG) BY MOUTH TWICE DAILY WITH A MEAL 60 tablet 4  . glipiZIDE (GLUCOTROL XL) 5 MG 24 hr tablet   1  . glucose blood (ONE TOUCH ULTRA TEST) test strip Use as directed 4 times a day. Dx code : R73.9 and E16.2 100 each 2  . ibuprofen (ADVIL,MOTRIN) 200 MG tablet Take 400 mg by mouth daily.    . Lancets MISC Use as directed 4 times a day. Dx code : R73.9 and E16.2 100 each 2  . lisinopril-hydrochlorothiazide (PRINZIDE,ZESTORETIC) 20-25 MG per tablet Take 1 tablet by mouth daily. 90 tablet 3  . metFORMIN (GLUCOPHAGE-XR) 500 MG 24 hr tablet Take 4 tablets (2,000 mg total) by mouth daily with breakfast. 360 tablet 3  . Omega-3 Fatty Acids  (FISH OIL) 1200 MG CAPS Take by mouth daily.    Marland Kitchen omeprazole (PRILOSEC OTC) 20 MG tablet Take 20 mg by mouth daily.    . pravastatin (PRAVACHOL) 40 MG tablet Take 1 tablet (40 mg total) by mouth daily with supper. 90 tablet 1  . cyclobenzaprine (FLEXERIL) 10 MG tablet Take 1 tablet (10 mg total) by mouth at bedtime as needed for muscle spasms. (Patient not taking: Reported on 03/13/2015) 30 tablet 0  . linagliptin (TRADJENTA) 5 MG TABS tablet Take 1 tablet (5 mg total) by mouth daily. (Patient not taking: Reported on 03/13/2015) 30 tablet 11  . meloxicam (MOBIC) 15 MG tablet Take 1 tablet (15 mg total) by mouth daily. (Patient not taking: Reported on 03/13/2015) 30 tablet 0   No current facility-administered medications for this visit.    Allergies:   Review of patient's allergies indicates no known allergies.   Past Medical History  Diagnosis Date  . GERD (gastroesophageal reflux disease)   . Hypertension   . Diabetes mellitus without complication     No past surgical history on file.   Social History:  The patient  reports that he has never smoked. He does not have any smokeless tobacco history on file. He reports that he drinks alcohol. He reports that he does not use illicit drugs.   Family History:  The patient's family history includes Diabetes in his father.    ROS:  Please see the history of present illness. All other systems are reviewed and  Negative to the above problem except as noted.    PHYSICAL EXAM: VS:  BP 132/88 mmHg  Pulse 105  Ht _0  (1.905 m)  Wt 420 lb (190.511 kg)  BMI 52.50 kg/m2  SpO2 97%  GEN: Morbidly obese 44 yo , in no acute distress HEENT: normal Neck: no JVD, carotid bruits, or masses Cardiac: RRR; no murmurs, rubs, or gallops,no edema  Respiratory:  clear to auscultation bilaterally, normal work of breathing GI: soft, nontender, nondistended, + BS  No hepatomegaly  MS: no deformity Moving all extremities   Skin: warm and dry, no rash Neuro:   Strength and sensation are intact Psych: euthymic mood, full affect   EKG:  EKG is  Not ordered today.  In June SR 97  LVH  LAFB     Lipid Panel    Component Value Date/Time   CHOL 206* 12/02/2014 1442   TRIG 547* 12/02/2014 1442   HDL 38* 12/02/2014 1442   CHOLHDL 5.4 12/02/2014 1442   VLDL NOT CALC 12/02/2014 1442   LDLCALC NOT CALC 12/02/2014 1442      Wt Readings from Last 3 Encounters:  03/13/15 420 lb (190.511 kg)  01/06/15 425 lb (192.779 kg)  01/03/15 415 lb (188.243 kg)      ASSESSMENT AND PLAN:  1  CP  Atypical  I do not think cardiac  2  CAD  Pt with evid of CAD on chest CT  He is active physically   HIstory does not suggest a flow limiting leasion  Will get echo  3.  HTN  BP is fair  WIl needs to be followed  4.  ? Sleep apnea  WIll set up for sleep study  5.  DM  WIll check A1C  Refer to dietary  6  Morbid obesity  WIll refer to dietary  7  Tobacco  COunselled on cessation    F/U in May     Signed, Chad Carnes, MD  03/13/2015 3:51 PM    [facility] [place], [place], [place]  [address] Phone: [phone]; Fax: [phone]

## 2015-04-06 NOTE — Interval H&P Note (Signed)
Cath Lab Visit (complete for each Cath Lab visit)  Clinical Evaluation Leading to the Procedure:   ACS: No.  Non-ACS:    Anginal Classification: CCS III  Anti-ischemic medical therapy: Minimal Therapy (1 class of medications)  Non-Invasive Test Results: No non-invasive testing performed  Prior CABG: No previous CABG      History and Physical Interval Note:  04/06/2015 7:31 AM  Chad [name]  has presented today for surgery, with the diagnosis of c/p  The various methods of treatment have been discussed with the patient and family. After consideration of risks, benefits and other options for treatment, the patient has consented to  Procedure(s): Left Heart Cath and Coronary Angiography (N/A) as a surgical intervention .  The patient's history has been reviewed, patient examined, no change in status, stable for surgery.  I have reviewed the patient's chart and labs.  Questions were answered to the patient's satisfaction.     Sinclair Grooms

## 2015-04-06 NOTE — Discharge Instructions (Signed)
Radial Site Care °Refer to this sheet in the next few weeks. These instructions provide you with information on caring for yourself after your procedure. Your caregiver may also give you more specific instructions. Your treatment has been planned according to current medical practices, but problems sometimes occur. Call your caregiver if you have any problems or questions after your procedure. °HOME CARE INSTRUCTIONS °· You may shower the day after the procedure. Remove the bandage (dressing) and gently wash the site with plain soap and water. Gently pat the site dry. °· Do not apply powder or lotion to the site. °· Do not submerge the affected site in water for 3 to 5 days. °· Inspect the site at least twice daily. °· Do not flex or bend the affected arm for 24 hours. °· No lifting over 5 pounds (2.3 kg) for 5 days after your procedure. °· Do not drive home if you are discharged the same day of the procedure. Have someone else drive you. °· You may drive 24 hours after the procedure unless otherwise instructed by your caregiver. °· Do not operate machinery or power tools for 24 hours. °· A responsible adult should be with you for the first 24 hours after you arrive home. °What to expect: °· Any bruising will usually fade within 1 to 2 weeks. °· Blood that collects in the tissue (hematoma) may be painful to the touch. It should usually decrease in size and tenderness within 1 to 2 weeks. °SEEK IMMEDIATE MEDICAL CARE IF: °· You have unusual pain at the radial site. °· You have redness, warmth, swelling, or pain at the radial site. °· You have drainage (other than a small amount of blood on the dressing). °· You have chills. °· You have a fever or persistent symptoms for more than 72 hours. °· You have a fever and your symptoms suddenly get worse. °· Your arm becomes pale, cool, tingly, or numb. °· You have heavy bleeding from the site. Hold pressure on the site and call 911. °Document Released: 08/03/2010 Document  Revised: 09/23/2011 Document Reviewed: 08/03/2010 °ExitCare® Patient Information ©2015 [employer]. This information is not intended to replace advice given to you by your health care provider. Make sure you discuss any questions you have with your health care provider. ° °

## 2015-04-07 ENCOUNTER — Ambulatory Visit: Payer: No Typology Code available for payment source | Admitting: Family Medicine

## 2015-04-12 ENCOUNTER — Ambulatory Visit (INDEPENDENT_AMBULATORY_CARE_PROVIDER_SITE_OTHER)
Admission: RE | Admit: 2015-04-12 | Discharge: 2015-04-12 | Disposition: A | Discharge status: Home / Self Care | Payer: No Typology Code available for payment source | Source: Ambulatory Visit | Attending: Internal Medicine | Admitting: Internal Medicine

## 2015-04-12 DIAGNOSIS — R931 Abnormal findings on diagnostic imaging of heart and coronary circulation: Secondary | ICD-10-CM | POA: Diagnosis not present

## 2015-04-12 MED ORDER — IOHEXOL 350 MG/ML SOLN
100.0000 mL | Freq: Once | INTRAVENOUS | Status: AC | PRN
Start: 2015-04-12 — End: 2015-04-12
  Administered 2015-04-12: 100 mL via INTRAVENOUS

## 2015-04-13 ENCOUNTER — Other Ambulatory Visit: Payer: Self-pay | Admitting: *Deleted

## 2015-04-13 DIAGNOSIS — I77810 Thoracic aortic ectasia: Secondary | ICD-10-CM

## 2015-04-14 ENCOUNTER — Ambulatory Visit: Payer: No Typology Code available for payment source | Admitting: *Deleted

## 2015-05-11 ENCOUNTER — Emergency Department (HOSPITAL_COMMUNITY)
Admission: EM | Admit: 2015-05-11 | Discharge: 2015-05-11 | Disposition: A | Discharge status: Home / Self Care | Payer: No Typology Code available for payment source | Attending: Emergency Medicine | Admitting: Emergency Medicine

## 2015-05-11 ENCOUNTER — Encounter (HOSPITAL_COMMUNITY): Payer: Self-pay | Admitting: Emergency Medicine

## 2015-05-11 ENCOUNTER — Emergency Department (HOSPITAL_COMMUNITY): Payer: No Typology Code available for payment source

## 2015-05-11 DIAGNOSIS — N3090 Cystitis, unspecified without hematuria: Secondary | ICD-10-CM | POA: Diagnosis not present

## 2015-05-11 DIAGNOSIS — E119 Type 2 diabetes mellitus without complications: Secondary | ICD-10-CM | POA: Diagnosis not present

## 2015-05-11 DIAGNOSIS — R1084 Generalized abdominal pain: Secondary | ICD-10-CM | POA: Diagnosis present

## 2015-05-11 DIAGNOSIS — K219 Gastro-esophageal reflux disease without esophagitis: Secondary | ICD-10-CM | POA: Diagnosis not present

## 2015-05-11 DIAGNOSIS — Z79899 Other long term (current) drug therapy: Secondary | ICD-10-CM | POA: Insufficient documentation

## 2015-05-11 DIAGNOSIS — Z7982 Long term (current) use of aspirin: Secondary | ICD-10-CM | POA: Diagnosis not present

## 2015-05-11 DIAGNOSIS — I10 Essential (primary) hypertension: Secondary | ICD-10-CM | POA: Insufficient documentation

## 2015-05-11 DIAGNOSIS — R1030 Lower abdominal pain, unspecified: Secondary | ICD-10-CM

## 2015-05-11 LAB — LIPASE, BLOOD: LIPASE: 27 U/L (ref 11–51)

## 2015-05-11 LAB — URINE MICROSCOPIC-ADD ON

## 2015-05-11 LAB — URINALYSIS, ROUTINE W REFLEX MICROSCOPIC
GLUCOSE, UA: NEGATIVE mg/dL
HGB URINE DIPSTICK: NEGATIVE
Ketones, ur: 15 mg/dL — AB
Nitrite: NEGATIVE
Protein, ur: NEGATIVE mg/dL
SPECIFIC GRAVITY, URINE: 1.028 (ref 1.005–1.030)
UROBILINOGEN UA: 1 mg/dL (ref 0.0–1.0)
pH: 6 (ref 5.0–8.0)

## 2015-05-11 LAB — CBC
HEMATOCRIT: 45.4 % (ref 39.0–52.0)
HEMOGLOBIN: 15.6 g/dL (ref 13.0–17.0)
MCH: 32.8 pg (ref 26.0–34.0)
MCHC: 34.4 g/dL (ref 30.0–36.0)
MCV: 95.4 fL (ref 78.0–100.0)
Platelets: 267 10*3/uL (ref 150–400)
RBC: 4.76 MIL/uL (ref 4.22–5.81)
RDW: 13 % (ref 11.5–15.5)
WBC: 11.6 10*3/uL — ABNORMAL HIGH (ref 4.0–10.5)

## 2015-05-11 LAB — COMPREHENSIVE METABOLIC PANEL
ALBUMIN: 3.9 g/dL (ref 3.5–5.0)
ALT: 40 U/L (ref 17–63)
ANION GAP: 10 (ref 5–15)
AST: 34 U/L (ref 15–41)
Alkaline Phosphatase: 40 U/L (ref 38–126)
BUN: 11 mg/dL (ref 6–20)
CHLORIDE: 98 mmol/L — AB (ref 101–111)
CO2: 26 mmol/L (ref 22–32)
Calcium: 9.1 mg/dL (ref 8.9–10.3)
Creatinine, Ser: 0.84 mg/dL (ref 0.61–1.24)
GFR calc non Af Amer: >60 mL/min (ref >60)
GLUCOSE: 178 mg/dL — AB (ref 65–99)
Potassium: 3.9 mmol/L (ref 3.5–5.1)
Sodium: 134 mmol/L — ABNORMAL LOW (ref 135–145)
Total Bilirubin: 1.3 mg/dL — ABNORMAL HIGH (ref 0.3–1.2)
Total Protein: 7 g/dL (ref 6.5–8.1)

## 2015-05-11 MED ORDER — CIPROFLOXACIN HCL 500 MG PO TABS
500.0000 mg | ORAL_TABLET | Freq: Once | ORAL | Status: AC
  Administered 2015-05-11: 500 mg via ORAL
  Filled 2015-05-11: qty 1

## 2015-05-11 MED ORDER — ONDANSETRON HCL 4 MG/2ML IJ SOLN: 4.0000 mg | Freq: Once | INTRAMUSCULAR | Status: DC

## 2015-05-11 MED ORDER — IOHEXOL 300 MG/ML  SOLN
25.0000 mL | Freq: Once | INTRAMUSCULAR | Status: AC | PRN
  Administered 2015-05-11: 25 mL via ORAL

## 2015-05-11 MED ORDER — IOHEXOL 300 MG/ML  SOLN
100.0000 mL | Freq: Once | INTRAMUSCULAR | Status: AC | PRN
  Administered 2015-05-11: 100 mL via INTRAVENOUS

## 2015-05-11 MED ORDER — SODIUM CHLORIDE 0.9 % IV BOLUS (SEPSIS)
1000.0000 mL | Freq: Once | INTRAVENOUS | Status: AC
  Administered 2015-05-11: 1000 mL via INTRAVENOUS

## 2015-05-11 MED ORDER — CIPROFLOXACIN HCL 500 MG PO TABS: 500.0000 mg | ORAL_TABLET | Freq: Two times a day (BID) | ORAL | Status: DC

## 2015-05-11 NOTE — ED Notes (Signed)
Patient transported to CT 

## 2015-05-11 NOTE — ED Notes (Signed)
MD at bedside. 

## 2015-05-11 NOTE — Discharge Instructions (Signed)
Abdominal Pain, Adult °Many things can cause abdominal pain. Usually, abdominal pain is not caused by a disease and will improve without treatment. It can often be observed and treated at home. Your health care provider will do a physical exam and possibly order blood tests and X-rays to help determine the seriousness of your pain. However, in many cases, more time must pass before a clear cause of the pain can be found. Before that point, your health care provider may not know if you need more testing or further treatment. °HOME CARE INSTRUCTIONS °Monitor your abdominal pain for any changes. The following actions may help to alleviate any discomfort you are experiencing: °· Only take over-the-counter or prescription medicines as directed by your health care provider. °· Do not take laxatives unless directed to do so by your health care provider. °· Try a clear liquid diet (broth, tea, or water) as directed by your health care provider. Slowly move to a bland diet as tolerated. °SEEK MEDICAL CARE IF: °· You have unexplained abdominal pain. °· You have abdominal pain associated with nausea or diarrhea. °· You have pain when you urinate or have a bowel movement. °· You experience abdominal pain that wakes you in the night. °· You have abdominal pain that is worsened or improved by eating food. °· You have abdominal pain that is worsened with eating fatty foods. °· You have a fever. °SEEK IMMEDIATE MEDICAL CARE IF: °· Your pain does not go away within 2 hours. °· You keep throwing up (vomiting). °· Your pain is felt only in portions of the abdomen, such as the right side or the left lower portion of the abdomen. °· You pass bloody or black tarry stools. °MAKE SURE YOU: °· Understand these instructions. °· Will watch your condition. °· Will get help right away if you are not doing well or get worse. °  °This information is not intended to replace advice given to you by your health care provider. Make sure you discuss  any questions you have with your health care provider. °  °Document Released: 04/10/2005 Document Revised: 03/22/2015 Document Reviewed: 03/10/2013 °Elsevier Interactive Patient Education ©2016 [employer] °Urinary Tract Infection °Urinary tract infections (UTIs) can develop anywhere along your urinary tract. Your urinary tract is your body's drainage system for removing wastes and extra water. Your urinary tract includes two kidneys, two ureters, a bladder, and a urethra. Your kidneys are a pair of bean-shaped organs. Each kidney is about the size of your fist. They are located below your ribs, one on each side of your spine. °CAUSES °Infections are caused by microbes, which are microscopic organisms, including fungi, viruses, and bacteria. These organisms are so small that they can only be seen through a microscope. Bacteria are the microbes that most commonly cause UTIs. °SYMPTOMS  °Symptoms of UTIs may vary by age and gender of the patient and by the location of the infection. Symptoms in young women typically include a frequent and intense urge to urinate and a painful, burning feeling in the bladder or urethra during urination. Older women and men are more likely to be tired, shaky, and weak and have muscle aches and abdominal pain. A fever may mean the infection is in your kidneys. Other symptoms of a kidney infection include pain in your back or sides below the ribs, nausea, and vomiting. °DIAGNOSIS °To diagnose a UTI, your caregiver will ask you about your symptoms. Your caregiver will also ask you to provide a urine sample. The   urine sample will be tested for bacteria and white blood cells. White blood cells are made by your body to help fight infection. °TREATMENT  °Typically, UTIs can be treated with medication. Because most UTIs are caused by a bacterial infection, they usually can be treated with the use of antibiotics. The choice of antibiotic and length of treatment depend on your symptoms and the  type of bacteria causing your infection. °HOME CARE INSTRUCTIONS °· If you were prescribed antibiotics, take them exactly as your caregiver instructs you. Finish the medication even if you feel better after you have only taken some of the medication. °· Drink enough water and fluids to keep your urine clear or pale yellow. °· Avoid caffeine, tea, and carbonated beverages. They tend to irritate your bladder. °· Empty your bladder often. Avoid holding urine for long periods of time. °· Empty your bladder before and after sexual intercourse. °· After a bowel movement, women should cleanse from front to back. Use each tissue only once. °SEEK MEDICAL CARE IF:  °· You have back pain. °· You develop a fever. °· Your symptoms do not begin to resolve within 3 days. °SEEK IMMEDIATE MEDICAL CARE IF:  °· You have severe back pain or lower abdominal pain. °· You develop chills. °· You have nausea or vomiting. °· You have continued burning or discomfort with urination. °MAKE SURE YOU:  °· Understand these instructions. °· Will watch your condition. °· Will get help right away if you are not doing well or get worse. °  °This information is not intended to replace advice given to you by your health care provider. Make sure you discuss any questions you have with your health care provider. °  °Document Released: 04/10/2005 Document Revised: 03/22/2015 Document Reviewed: 08/09/2011 °Elsevier Interactive Patient Education ©2016 Elsevier Inc. ° °

## 2015-05-11 NOTE — ED Notes (Signed)
Pt in C/O generalized abd pain that started yesterday, describes as "pins and needles" Also reports nausea

## 2015-05-11 NOTE — ED Provider Notes (Signed)
CSN: [id number]     Arrival date & time 05/11/15  3545 History   None    Chief Complaint  Patient presents with  . Abdominal Pain     The history is provided by the patient. No language interpreter was used.   Chad [name] presents for evaluation of abdominal pain. He reports 1-2 days of generalized abdominal pain that is greatest over the lower abdomen and right lower quadrant. The pain is described as a discomfort and pins and needle sensation. He has associated nausea and decreased oral intake. He denies any fevers, vomiting, dysuria. He had mild diarrhea yesterday, now resolved. He is a regular drinker, drinks 3-4 liquor drinks nightly. No prior abdominal surgeries. He has a history of diabetes and takes oral agents to control this. Symptoms are moderate, waxing and waning, worsening.  Past Medical History  Diagnosis Date  . GERD (gastroesophageal reflux disease)   . Hypertension   . Diabetes mellitus without complication [facility])    Past Surgical History  Procedure Laterality Date  . Cardiac catheterization N/A 04/06/2015    Procedure: Left Heart Cath and Coronary Angiography;  Surgeon: Belva Crome, MD;  Location: [place] [facility];  Service: Cardiovascular;  Laterality: N/A;   Family History  Problem Relation Age of Onset  . Diabetes Father    Social History  Substance Use Topics  . Smoking status: Never Smoker   . Smokeless tobacco: None     Comment: occasionally dips tobacco  . Alcohol Use: Yes     Comment: 3-4 drinks of liqour a night, has been cutting back.      Review of Systems  All other systems reviewed and are negative.     Allergies  Review of patient's allergies indicates no known allergies.  Home Medications   Prior to Admission medications   Medication Sig Start Date End Date Taking? Authorizing Provider  Ascorbic Acid (VITAMIN C) 1000 MG tablet Take 1,000 mg by mouth daily.   Yes Historical Provider, MD  aspirin EC 81 MG tablet Take 1 tablet (81 mg  total) by mouth daily. 03/01/14  Yes Shawnee Knapp, MD  carvedilol (COREG) 6.25 MG tablet TAKE 1 TABLET(6.25 MG) BY MOUTH TWICE DAILY WITH A MEAL 02/28/15  Yes Shawnee Knapp, MD  glipiZIDE (GLUCOTROL XL) 5 MG 24 hr tablet Take 5 mg by mouth daily with breakfast.  02/27/15  Yes Historical Provider, MD  ibuprofen (ADVIL,MOTRIN) 200 MG tablet Take 400 mg by mouth every 8 (eight) hours as needed for mild pain.    Yes Historical Provider, MD  lisinopril-hydrochlorothiazide (PRINZIDE,ZESTORETIC) 20-25 MG per tablet Take 1 tablet by mouth daily. 12/02/14  Yes Shawnee Knapp, MD  metFORMIN (GLUCOPHAGE-XR) 500 MG 24 hr tablet Take 4 tablets (2,000 mg total) by mouth daily with breakfast. 12/02/14  Yes Shawnee Knapp, MD  omeprazole (PRILOSEC OTC) 20 MG tablet Take 20 mg by mouth daily.   Yes Historical Provider, MD  pravastatin (PRAVACHOL) 40 MG tablet Take 1 tablet (40 mg total) by mouth daily with supper. 12/05/14   Shawnee Knapp, MD   BP 149/90 mmHg  Pulse 98  Temp(Src) 97.8 F (36.6 C) (Oral)  Resp 19  SpO2 98% Physical Exam  Constitutional: He is oriented to person, place, and time. He appears well-developed and well-nourished.  HENT:  Head: Normocephalic and atraumatic.  Cardiovascular: Normal rate and regular rhythm.   No murmur heard. Pulmonary/Chest: Effort normal and breath sounds normal. No respiratory distress.  Abdominal:  Soft. There is no rebound and no guarding.  Moderate right lower quadrant tenderness to deep palpation.  Musculoskeletal: He exhibits no edema or tenderness.  Neurological: He is alert and oriented to person, place, and time.  Skin: Skin is warm and dry.  Psychiatric: He has a normal mood and affect. His behavior is normal.  Nursing note and vitals reviewed.   ED Course  Procedures (including critical care time) Labs Review Labs Reviewed  COMPREHENSIVE METABOLIC PANEL - Abnormal; Notable for the following:    Sodium 134 (*)    Chloride 98 (*)    Glucose, Bld 178 (*)    Total  Bilirubin 1.3 (*)    All other components within normal limits  CBC - Abnormal; Notable for the following:    WBC 11.6 (*)    All other components within normal limits  URINALYSIS, ROUTINE W REFLEX MICROSCOPIC (NOT AT [facility]) - Abnormal; Notable for the following:    Color, Urine AMBER (*)    APPearance CLOUDY (*)    Bilirubin Urine SMALL (*)    Ketones, ur 15 (*)    Leukocytes, UA SMALL (*)    All other components within normal limits  URINE CULTURE  LIPASE, BLOOD  URINE MICROSCOPIC-ADD ON    Imaging Review Ct Abdomen Pelvis W Contrast  05/11/2015  CLINICAL DATA:  One day history abdominal pain and nausea EXAM: CT ABDOMEN AND PELVIS WITH CONTRAST TECHNIQUE: Multidetector CT imaging of the abdomen and pelvis was performed using the standard protocol following bolus administration of intravenous contrast. Oral contrast was also administered. CONTRAST:  126mL OMNIPAQUE IOHEXOL 300 MG/ML  SOLN COMPARISON:  Ultrasound right upper quadrant January 12, 2015 FINDINGS: Lower chest:  Lung bases are clear. Hepatobiliary: Liver is prominent, measuring 22.0 cm in length. No focal liver lesions are identified. There are gallstones and sludge within the gallbladder. The gallbladder wall does not appear appreciably thickened. There is no biliary duct dilatation. Pancreas: No mass or inflammatory focus. Spleen: No focal splenic lesions are identified. Adrenals/Urinary Tract: Adrenals appear normal bilaterally. Kidneys bilaterally show no mass or hydronephrosis on either side. There is no renal or ureteral calculus on either side. The urinary bladder is midline. The urinary bladder wall does appear slightly thickened. Stomach/Bowel: There is no mesenteric or bowel wall thickening. No bowel obstruction. No free air or portal venous air. Vascular/Lymphatic: There is no abdominal aortic aneurysm. No vascular lesions are appreciable. There are subcentimeter periaortic lymph nodes which do not meet size criteria for  pathologic significance. By size criteria, there is no adenopathy in the abdomen or pelvis. Reproductive: Prostate is normal in size and contour. No pelvic mass. Other: Appendix appears normal. No abscess or ascites in the abdomen or pelvis. 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 There is degenerative change in the lumbar spine. There are no blastic or lytic bone lesions. No intramuscular or abdominal wall lesions. IMPRESSION: Gallstones with sludge in the gallbladder. By CT, gallbladder wall does not appear appreciably thickened. Liver prominent without focal liver lesion appreciable. Mild thickening of the urinary bladder wall. Question a degree of cystitis. Correlation with urinalysis advised. No renal or ureteral calculus. No hydronephrosis. No bowel obstruction.  No abscess.  Appendix appears normal. Electronically Signed   By: Lowella Grip III M.D.   On: 05/11/2015 08:46   I have personally reviewed and evaluated these images and lab results as part of my medical decision-making.   EKG Interpretation None  MDM   Final diagnoses:  Lower abdominal pain  Cystitis    Pt w/ hx/o EtOH use, DM here for evaluation of lower abdominal pain/nausea.  Pt declines pain meds in ED.  Treating nausea, checking CT abd to r/o appendicitis given RLQ tenderness on exam.     CT abdomen with bladder wall thickening, UA with leukocytes, treating for cystitis with Cipro. Discussed with patient this is all finding of gallstones. Discussed home care and return precautions.   Quintella Reichert, MD 05/11/15 [phone]

## 2015-05-12 LAB — URINE CULTURE: CULTURE: NO GROWTH

## 2015-05-16 DIAGNOSIS — K625 Hemorrhage of anus and rectum: Secondary | ICD-10-CM

## 2015-05-16 HISTORY — DX: Hemorrhage of anus and rectum: K62.5

## 2015-05-18 ENCOUNTER — Emergency Department (HOSPITAL_COMMUNITY): Payer: No Typology Code available for payment source

## 2015-05-18 ENCOUNTER — Encounter (HOSPITAL_COMMUNITY): Payer: Self-pay | Admitting: *Deleted

## 2015-05-18 ENCOUNTER — Emergency Department (HOSPITAL_COMMUNITY)
Admission: EM | Admit: 2015-05-18 | Discharge: 2015-05-18 | Disposition: A | Discharge status: Home / Self Care | Payer: No Typology Code available for payment source | Attending: Emergency Medicine | Admitting: Emergency Medicine

## 2015-05-18 DIAGNOSIS — E119 Type 2 diabetes mellitus without complications: Secondary | ICD-10-CM | POA: Diagnosis not present

## 2015-05-18 DIAGNOSIS — K8020 Calculus of gallbladder without cholecystitis without obstruction: Secondary | ICD-10-CM | POA: Insufficient documentation

## 2015-05-18 DIAGNOSIS — Z79899 Other long term (current) drug therapy: Secondary | ICD-10-CM | POA: Diagnosis not present

## 2015-05-18 DIAGNOSIS — K219 Gastro-esophageal reflux disease without esophagitis: Secondary | ICD-10-CM | POA: Diagnosis not present

## 2015-05-18 DIAGNOSIS — I10 Essential (primary) hypertension: Secondary | ICD-10-CM | POA: Insufficient documentation

## 2015-05-18 DIAGNOSIS — Z7982 Long term (current) use of aspirin: Secondary | ICD-10-CM | POA: Diagnosis not present

## 2015-05-18 DIAGNOSIS — Z9889 Other specified postprocedural states: Secondary | ICD-10-CM | POA: Diagnosis not present

## 2015-05-18 DIAGNOSIS — R109 Unspecified abdominal pain: Secondary | ICD-10-CM | POA: Diagnosis present

## 2015-05-18 DIAGNOSIS — Z792 Long term (current) use of antibiotics: Secondary | ICD-10-CM | POA: Diagnosis not present

## 2015-05-18 HISTORY — DX: Calculus of gallbladder without cholecystitis without obstruction: K80.20

## 2015-05-18 LAB — URINE MICROSCOPIC-ADD ON

## 2015-05-18 LAB — URINALYSIS, ROUTINE W REFLEX MICROSCOPIC
Glucose, UA: NEGATIVE mg/dL
Hgb urine dipstick: NEGATIVE
KETONES UR: 15 mg/dL — AB
NITRITE: NEGATIVE
PH: 6 (ref 5.0–8.0)
PROTEIN: NEGATIVE mg/dL
Specific Gravity, Urine: 1.026 (ref 1.005–1.030)
UROBILINOGEN UA: 1 mg/dL (ref 0.0–1.0)

## 2015-05-18 LAB — CBC
HCT: 43.1 % (ref 39.0–52.0)
Hemoglobin: 14.8 g/dL (ref 13.0–17.0)
MCH: 32.3 pg (ref 26.0–34.0)
MCHC: 34.3 g/dL (ref 30.0–36.0)
MCV: 94.1 fL (ref 78.0–100.0)
PLATELETS: 277 10*3/uL (ref 150–400)
RBC: 4.58 MIL/uL (ref 4.22–5.81)
RDW: 12.8 % (ref 11.5–15.5)
WBC: 11.9 10*3/uL — AB (ref 4.0–10.5)

## 2015-05-18 LAB — COMPREHENSIVE METABOLIC PANEL
ALT: 37 U/L (ref 17–63)
AST: 25 U/L (ref 15–41)
Albumin: 4 g/dL (ref 3.5–5.0)
Alkaline Phosphatase: 38 U/L (ref 38–126)
Anion gap: 11 (ref 5–15)
BILIRUBIN TOTAL: 1.4 mg/dL — AB (ref 0.3–1.2)
BUN: 8 mg/dL (ref 6–20)
CALCIUM: 9.3 mg/dL (ref 8.9–10.3)
CHLORIDE: 99 mmol/L — AB (ref 101–111)
CO2: 26 mmol/L (ref 22–32)
CREATININE: 0.85 mg/dL (ref 0.61–1.24)
Glucose, Bld: 199 mg/dL — ABNORMAL HIGH (ref 65–99)
Potassium: 3.9 mmol/L (ref 3.5–5.1)
Sodium: 136 mmol/L (ref 135–145)
TOTAL PROTEIN: 7.5 g/dL (ref 6.5–8.1)

## 2015-05-18 LAB — LIPASE, BLOOD: LIPASE: 25 U/L (ref 11–51)

## 2015-05-18 MED ORDER — ONDANSETRON 4 MG PO TBDP
4.0000 mg | ORAL_TABLET | Freq: Once | ORAL | Status: AC | PRN
  Administered 2015-05-18: 4 mg via ORAL

## 2015-05-18 MED ORDER — SODIUM CHLORIDE 0.9 % IV BOLUS (SEPSIS)
1000.0000 mL | Freq: Once | INTRAVENOUS | Status: AC
Start: 2015-05-18 — End: 2015-05-18
  Administered 2015-05-18: 1000 mL via INTRAVENOUS

## 2015-05-18 MED ORDER — PROMETHAZINE HCL 25 MG PO TABS: 25.0000 mg | ORAL_TABLET | Freq: Four times a day (QID) | ORAL | Status: DC | PRN

## 2015-05-18 MED ORDER — ONDANSETRON 4 MG PO TBDP
ORAL_TABLET | ORAL | Status: AC
  Filled 2015-05-18: qty 1

## 2015-05-18 MED ORDER — DICYCLOMINE HCL 20 MG PO TABS: 20.0000 mg | ORAL_TABLET | Freq: Two times a day (BID) | ORAL | Status: DC

## 2015-05-18 MED ORDER — MORPHINE SULFATE (PF) 4 MG/ML IV SOLN
4.0000 mg | Freq: Once | INTRAVENOUS | Status: AC
  Administered 2015-05-18: 4 mg via INTRAVENOUS
  Filled 2015-05-18: qty 1

## 2015-05-18 MED ORDER — PROMETHAZINE HCL 25 MG/ML IJ SOLN
12.5000 mg | Freq: Once | INTRAMUSCULAR | Status: AC
  Administered 2015-05-18: 12.5 mg via INTRAVENOUS
  Filled 2015-05-18: qty 1

## 2015-05-18 NOTE — ED Notes (Signed)
Pt unable to void at this time. 

## 2015-05-18 NOTE — ED Notes (Addendum)
Patient was seen here on the 27th for generalized abdominal pain. Patient states pain had subsided then last night it started again with severe nausea and vomiting. Patient reports hx of gallstones

## 2015-05-18 NOTE — ED Provider Notes (Addendum)
CSN: [id number]     Arrival date & time 05/18/15  1144 History  First MD Initiated Contact with Patient 05/18/15 1503     Chief Complaint  Patient presents with  . Abdominal Pain  . Emesis   HPI Pt started having pain last night.  The pain is all over the abdomen, not specifically above or below the belly button.  It feels like gas.  The pain intensified throughout the day today.  He feels very nauseous.  The pain goes to his shoulder blades and his back.  It is constant and does not get better.  He was seen for similar sx one week ago.  He had lab tests and a CT scan that showed possible inflammation in the gall bladder area.  His urine culture was negatve.  He is still taking abx from the last visit.  He did drink some alcoholic beverages yesterday. Past Medical History  Diagnosis Date  . GERD (gastroesophageal reflux disease)   . Hypertension   . Diabetes mellitus without complication ([place])     Takes Metformin (05/11/15)  . Gallstone    Past Surgical History  Procedure Laterality Date  . Cardiac catheterization N/A 04/06/2015    Procedure: Left Heart Cath and Coronary Angiography;  Surgeon: Belva Crome, MD;  Location: [facility];  Service: Cardiovascular;  Laterality: N/A;   Family History  Problem Relation Age of Onset  . Diabetes Father    Social History  Substance Use Topics  . Smoking status: Never Smoker   . Smokeless tobacco: None     Comment: occasionally dips tobacco  . Alcohol Use: Yes     Comment: 3-4 drinks of liqour a night, has been cutting back.      Review of Systems  All other systems reviewed and are negative.     Allergies  Review of patient's allergies indicates no known allergies.  Home Medications   Prior to Admission medications   Medication Sig Start Date End Date Taking? Authorizing Provider  Ascorbic Acid (VITAMIN C) 1000 MG tablet Take 1,000 mg by mouth daily.   Yes Historical Provider, MD  aspirin EC 81 MG tablet Take 1 tablet  (81 mg total) by mouth daily. 03/01/14  Yes Shawnee Leeon Makar, MD  ciprofloxacin (CIPRO) 500 MG tablet Take 1 tablet (500 mg total) by mouth every 12 (twelve) hours. 05/11/15  Yes Quintella Reichert, MD  glipiZIDE (GLUCOTROL XL) 5 MG 24 hr tablet Take 5 mg by mouth daily with breakfast.  02/27/15  Yes Historical Provider, MD  lisinopril-hydrochlorothiazide (PRINZIDE,ZESTORETIC) 20-25 MG per tablet Take 1 tablet by mouth daily. 12/02/14  Yes Shawnee Jerry Clyne, MD  metFORMIN (GLUCOPHAGE-XR) 500 MG 24 hr tablet Take 4 tablets (2,000 mg total) by mouth daily with breakfast. 12/02/14  Yes Shawnee Nile Prisk, MD  omeprazole (PRILOSEC OTC) 20 MG tablet Take 20 mg by mouth daily.   Yes Historical Provider, MD  pravastatin (PRAVACHOL) 40 MG tablet Take 1 tablet (40 mg total) by mouth daily with supper. 12/05/14  Yes Shawnee Dhwani Venkatesh, MD  carvedilol (COREG) 6.25 MG tablet TAKE 1 TABLET(6.25 MG) BY MOUTH TWICE DAILY WITH A MEAL 02/28/15   Shawnee Coltyn Hanning, MD  dicyclomine (BENTYL) 20 MG tablet Take 1 tablet (20 mg total) by mouth 2 (two) times daily. 05/18/15   Dorie Rank, MD  ibuprofen (ADVIL,MOTRIN) 200 MG tablet Take 400 mg by mouth every 8 (eight) hours as needed for mild pain.     Historical Provider,  MD  promethazine (PHENERGAN) 25 MG tablet Take 1 tablet (25 mg total) by mouth every 6 (six) hours as needed for nausea or vomiting. 05/18/15   Dorie Rank, MD   BP 150/80 mmHg  Pulse 106  Temp(Src) 98.9 F (37.2 C) (Oral)  Resp 18  SpO2 96% Physical Exam  Constitutional: He appears well-developed and well-nourished. No distress.  HENT:  Head: Normocephalic and atraumatic.  Right Ear: External ear normal.  Left Ear: External ear normal.  Eyes: Conjunctivae are normal. Right eye exhibits no discharge. Left eye exhibits no discharge. No scleral icterus.  Neck: Neck supple. No tracheal deviation present.  Cardiovascular: Normal rate, regular rhythm and intact distal pulses.   Pulmonary/Chest: Effort normal and breath sounds normal. No stridor. No  respiratory distress. He has no wheezes. He has no rales.  Abdominal: Soft. Bowel sounds are normal. He exhibits no distension. There is tenderness in the right upper quadrant. There is no rebound and no guarding.  Musculoskeletal: He exhibits no edema or tenderness.  Neurological: He is alert. He has normal strength. No cranial nerve deficit (no facial droop, extraocular movements intact, no slurred speech) or sensory deficit. He exhibits normal muscle tone. He displays no seizure activity. Coordination normal.  Skin: Skin is warm and dry. No rash noted.  Psychiatric: He has a normal mood and affect.  Nursing note and vitals reviewed.   ED Course  Procedures (including critical care time) Labs Review Labs Reviewed  COMPREHENSIVE METABOLIC PANEL - Abnormal; Notable for the following:    Chloride 99 (*)    Glucose, Bld 199 (*)    Total Bilirubin 1.4 (*)    All other components within normal limits  CBC - Abnormal; Notable for the following:    WBC 11.9 (*)    All other components within normal limits  LIPASE, BLOOD  URINALYSIS, ROUTINE W REFLEX MICROSCOPIC (NOT AT [facility])    Imaging Review US Abdomen Complete  05/18/2015  CLINICAL DATA:  44 year old male with abdominal pain, nausea and vomiting. EXAM: ULTRASOUND ABDOMEN COMPLETE COMPARISON:  05/11/2015 CT and 01/12/2015 ultrasound FINDINGS: Please note that patient body habitus significantly decreases sensitivity. Gallbladder: Cholelithiasis again identified. There is no evidence of gallbladder wall thickening, pericholecystic fluid or sonographic Murphy sign. Common bile duct: Diameter: 4.7 mm. There is no evidence of intrahepatic or extrahepatic biliary dilatation. Liver: Diffuse increased echogenicity is compatible with hepatic steatosis. No focal hepatic abnormalities are noted. IVC: Not well visualized Pancreas: Not well visualized Spleen: Size and appearance within normal limits. Right Kidney: Length: 11.5 cm. Echogenicity within normal  limits. No mass or hydronephrosis visualized. Left Kidney: Length: 12.5 cm. Echogenicity within normal limits. No mass or hydronephrosis visualized. Abdominal aorta: No aneurysm visualized. Other findings: None. IMPRESSION: No evidence of acute abnormality. Cholelithiasis without evidence of acute cholecystitis. Hepatic steatosis. Please note body habitus significantly decreases sensitivity. Electronically Signed   By: Margarette Canada M.D.   On: 05/18/2015 18:41   I have personally reviewed and evaluated these images and lab results as part of my medical decision-making.   EKG Interpretation   Date/Time:  Thursday May 18 2015 12:22:24 EDT Ventricular Rate:  108 PR Interval:  184 QRS Duration: 122 QT Interval:  374 QTC Calculation: 501 R Axis:   -48 Text Interpretation:  Sinus tachycardia Left anterior fascicular block  Left ventricular hypertrophy with QRS widening and repolarization  abnormality Abnormal ECG No significant change since last tracing  Confirmed by Claudius Mich  MD-J, Angelly Spearing ([id number]) on 05/18/2015 3:05:43 PM  MDM   Final diagnoses:  Abdominal pain, unspecified abdominal location  Gallstones    Sx improved with treatment in the ED.  Gallstones noted on prior CT and confirmed on [place]. No evidence of cholecystitis on the ultrasound. The patient's symptoms are also not suggestive of biliary colic. Pain is more generalized and not focused in the right upper quadrant.  Plan on discharge home with medications for nausea as well as Bentyl for stomach cramping.  Follow up with his primary doctor and/or consider seeing a gastroenterologist.    Dorie Rank, MD 05/18/15 1946  Added ROS and PE  Dorie Rank, MD 06/02/15 0730

## 2015-05-18 NOTE — Discharge Instructions (Signed)

## 2015-05-18 NOTE — ED Notes (Signed)
Patient transported to Ultrasound 

## 2015-05-25 ENCOUNTER — Ambulatory Visit (INDEPENDENT_AMBULATORY_CARE_PROVIDER_SITE_OTHER): Payer: No Typology Code available for payment source

## 2015-05-25 ENCOUNTER — Ambulatory Visit (INDEPENDENT_AMBULATORY_CARE_PROVIDER_SITE_OTHER): Payer: No Typology Code available for payment source | Admitting: Family Medicine

## 2015-05-25 DIAGNOSIS — G4700 Insomnia, unspecified: Secondary | ICD-10-CM

## 2015-05-25 DIAGNOSIS — Z79899 Other long term (current) drug therapy: Secondary | ICD-10-CM

## 2015-05-25 DIAGNOSIS — K760 Fatty (change of) liver, not elsewhere classified: Secondary | ICD-10-CM

## 2015-05-25 DIAGNOSIS — Z7289 Other problems related to lifestyle: Secondary | ICD-10-CM

## 2015-05-25 DIAGNOSIS — R1084 Generalized abdominal pain: Secondary | ICD-10-CM

## 2015-05-25 DIAGNOSIS — I10 Essential (primary) hypertension: Secondary | ICD-10-CM | POA: Diagnosis not present

## 2015-05-25 DIAGNOSIS — K5900 Constipation, unspecified: Secondary | ICD-10-CM

## 2015-05-25 DIAGNOSIS — K8020 Calculus of gallbladder without cholecystitis without obstruction: Secondary | ICD-10-CM | POA: Diagnosis not present

## 2015-05-25 DIAGNOSIS — E1165 Type 2 diabetes mellitus with hyperglycemia: Secondary | ICD-10-CM

## 2015-05-25 DIAGNOSIS — Z789 Other specified health status: Secondary | ICD-10-CM

## 2015-05-25 MED ORDER — GI COCKTAIL ~~LOC~~
30.0000 mL | Freq: Once | ORAL | Status: AC
  Administered 2015-05-25: 30 mL via ORAL

## 2015-05-25 MED ORDER — SUCRALFATE 1 G PO TABS: 1.0000 g | ORAL_TABLET | Freq: Three times a day (TID) | ORAL | Status: DC

## 2015-05-25 MED ORDER — PANTOPRAZOLE SODIUM 40 MG PO TBEC: 40.0000 mg | DELAYED_RELEASE_TABLET | Freq: Two times a day (BID) | ORAL | Status: DC

## 2015-05-25 MED ORDER — LORAZEPAM 1 MG PO TABS: 1.0000 mg | ORAL_TABLET | Freq: Every day | ORAL | Status: DC

## 2015-05-25 MED ORDER — DICYCLOMINE HCL 20 MG PO TABS: 20.0000 mg | ORAL_TABLET | Freq: Three times a day (TID) | ORAL | Status: DC

## 2015-05-25 MED ORDER — POLYETHYLENE GLYCOL 3350 17 GM/SCOOP PO POWD: 17.0000 g | Freq: Two times a day (BID) | ORAL | Status: DC

## 2015-05-25 NOTE — Patient Instructions (Addendum)
Stop your metformin and your pravastatin x 1 week. No over-the-counter pain medicines (tylenol, acetaminophen, aleve, ibuprofen, motrin, advil, goody's, etc.) No alcohol for 1 week. Start the omeprazole (or pantoprazole or nexium) 40mg  30 minutes before breakfast and 30 minutes before supper. Try to use the sulcrafate 4 times a day. Eat little meals frequently. Use the miralax twice a day - many of the medications are constipating which isn't going to help anything. Recheck with me in 1-2 weeks.  Low-Fat Diet for Pancreatitis or Gallbladder Conditions A low-fat diet can be helpful if you have pancreatitis or a gallbladder condition. With these conditions, your pancreas and gallbladder have trouble digesting fats. A healthy eating plan with less fat will help rest your pancreas and gallbladder and reduce your symptoms. WHAT DO I NEED TO KNOW ABOUT THIS DIET?  Eat a low-fat diet.  Reduce your fat intake to less than 20-30% of your total daily calories. This is less than 50-60 g of fat per day.  Remember that you need some fat in your diet. Ask your dietician what your daily goal should be.  Choose nonfat and low-fat healthy foods. Look for the words "nonfat," "low fat," or "fat free."  As a guide, look on the label and choose foods with less than 3 g of fat per serving. Eat only one serving.  Avoid alcohol.  Do not smoke. If you need help quitting, talk with your health care provider.  Eat small frequent meals instead of three large heavy meals. WHAT FOODS CAN I EAT? Grains Include healthy grains and starches such as potatoes, wheat bread, fiber-rich cereal, and brown rice. Choose whole grain options whenever possible. In adults, whole grains should account for 45-65% of your daily calories.  Fruits and Vegetables Eat plenty of fruits and vegetables. Fresh fruits and vegetables add fiber to your diet. Meats and Other Protein Sources Eat lean meat such as chicken and pork. Trim any fat  off of meat before cooking it. Eggs, fish, and beans are other sources of protein. In adults, these foods should account for 10-35% of your daily calories. Dairy Choose low-fat milk and dairy options. Dairy includes fat and protein, as well as calcium.  Fats and Oils Limit high-fat foods such as fried foods, sweets, baked goods, sugary drinks.  Other Creamy sauces and condiments, such as mayonnaise, can add extra fat. Think about whether or not you need to use them, or use smaller amounts or low fat options. WHAT FOODS ARE NOT RECOMMENDED?  High fat foods, such as:  [employer].  Ice cream.  [place] toast.  Sweet rolls.  Pizza.  Cheese bread.  Foods covered with batter, butter, creamy sauces, or cheese.  Fried foods.  Sugary drinks and desserts.  Foods that cause gas or bloating   This information is not intended to replace advice given to you by your health care provider. Make sure you discuss any questions you have with your health care provider.   Document Released: 07/06/2013 Document Reviewed: 07/06/2013 Elsevier Interactive Patient Education 2016 [employer]  Fatty Liver Fatty liver, also called hepatic steatosis or steatohepatitis, is a condition in which too much fat has built up in your liver cells. The liver removes harmful substances from your bloodstream. It produces fluids your body needs. It also helps your body use and store energy from the food you eat. In many cases, fatty liver does not cause symptoms or problems. It is often diagnosed when tests are being done for other reasons.  However, over time, fatty liver can cause inflammation that may lead to more serious liver problems, such as scarring of the liver (cirrhosis). CAUSES  Causes of fatty liver may include:   Drinking too much alcohol.  Poor nutrition.  Obesity.  Cushing syndrome.  Diabetes.  Hyperlipidemia.  Pregnancy.  Certain drugs.  Poisons.  Some viral infections. RISK  FACTORS You may be more likely to develop fatty liver if you:  Abuse alcohol.  Are pregnant.  Are overweight.  Have diabetes.  Have hepatitis.  Have a high triglyceride level.  SIGNS AND SYMPTOMS  Fatty liver often does not cause any symptoms. In cases where symptoms develop, they can include:  Fatigue.  Weakness.  Weight loss.  Confusion.   Abdominal pain.  Yellowing of your skin and the white parts of your eyes (jaundice).  Nausea and vomiting. DIAGNOSIS  Fatty liver may be diagnosed by:   Physical exam and medical history.  Blood tests.  Imaging tests, such as an ultrasound, CT scan, or MRI.  Liver biopsy. A small sample of liver tissue is removed using a needle. The sample is then looked at under a microscope. TREATMENT  Fatty liver is often caused by other health conditions. Treatment for fatty liver may involve medicines and lifestyle changes to manage conditions such as:   Alcoholism.  High cholesterol.  Diabetes.  Being overweight or obese.  HOME CARE INSTRUCTIONS  Eat a healthy diet as directed by your health care provider.  Exercise regularly. This can help you lose weight and control your cholesterol and diabetes. Talk to your health care provider about an exercise plan and which activities are best for you.  Do not drink alcohol.   Take medicines only as directed by your health care provider. SEEK MEDICAL CARE IF: You have difficulty controlling your:  Blood sugar.  Cholesterol.  Alcohol consumption. SEEK IMMEDIATE MEDICAL CARE IF:  You have abdominal pain.  You have jaundice.  You have nausea and vomiting.   This information is not intended to replace advice given to you by your health care provider. Make sure you discuss any questions you have with your health care provider.   Document Released: 08/16/2005 Document Revised: 07/22/2014 Document Reviewed: 11/10/2013 Elsevier Interactive Patient Education 2016 [employer]. Cholelithiasis Cholelithiasis (also called gallstones) is a form of gallbladder disease in which gallstones form in your gallbladder. The gallbladder is an organ that stores bile made in the liver, which helps digest fats. Gallstones begin as small crystals and slowly grow into stones. Gallstone pain occurs when the gallbladder spasms and a gallstone is blocking the duct. Pain can also occur when a stone passes out of the duct.  RISK FACTORS  Being male.   Having multiple pregnancies. Health care providers sometimes advise removing diseased gallbladders before future pregnancies.   Being obese.  Eating a diet heavy in fried foods and fat.   Being older than 66 years and increasing age.   Prolonged use of medicines containing male hormones.   Having diabetes mellitus.   Rapidly losing weight.   Having a family history of gallstones (heredity).  SYMPTOMS  Nausea.   Vomiting.  Abdominal pain.   Yellowing of the skin (jaundice).   Sudden pain. It may persist from several minutes to several hours.  Fever.   Tenderness to the touch. In some cases, when gallstones do not move into the bile duct, people have no pain or symptoms. These are called "silent" gallstones.  TREATMENT Silent gallstones do not  need treatment. In severe cases, emergency surgery may be required. Options for treatment include:  Surgery to remove the gallbladder. This is the most common treatment.  Medicines. These do not always work and may take 6-12 months or more to work.  Shock wave treatment (extracorporeal biliary lithotripsy). In this treatment an ultrasound machine sends shock waves to the gallbladder to break gallstones into smaller pieces that can pass into the intestines or be dissolved by medicine. HOME CARE INSTRUCTIONS   Only take over-the-counter or prescription medicines for pain, discomfort, or fever as directed by your health care provider.   Follow a low-fat diet  until seen again by your health care provider. Fat causes the gallbladder to contract, which can result in pain.   Follow up with your health care provider as directed. Attacks are almost always recurrent and surgery is usually required for permanent treatment.  SEEK IMMEDIATE MEDICAL CARE IF:   Your pain increases and is not controlled by medicines.   You have a fever or persistent symptoms for more than 2-3 days.   You have a fever and your symptoms suddenly get worse.   You have persistent nausea and vomiting.  MAKE SURE YOU:   Understand these instructions.  Will watch your condition.  Will get help right away if you are not doing well or get worse.   This information is not intended to replace advice given to you by your health care provider. Make sure you discuss any questions you have with your health care provider.   Document Released: 06/27/2005 Document Revised: 03/03/2013 Document Reviewed: 12/23/2012 Elsevier Interactive Patient Education [employer].

## 2015-05-25 NOTE — Progress Notes (Signed)
Subjective:    Patient ID: Chad [name], male    DOB: December 27, 1970, 44 y.o.   MRN: [id number] Chief Complaint  Patient presents with  . Hospitalization Follow-up    HPI  3 wks of abd pain, went to the ER and had an abdominal CT scan and was given cipro for a bladder infection. 1 wk later it got worse and hge was voimiting all night - couldn't take it anymore so he want back to the ER and they did an [place] of his abdomejn. Was given a med for nausea and for abdominal spasms cramps.  At times, it doesn't matter what he eats, he will get immed abd and side pain with gas and on other days it is minimal enough to be annoying but as not as intense. But happens all the time. Abd pain goes through to his shoulder bladers his flank, and sometimes right underribs around diaphragm. Gets better after passing gas.  He has cut down on alcohol use and is eating soup, yogurt, fruits, veggies - nothing greasy or fried at all.  Occ has upset stomach with the probiotic yogurt which he eats in the morning.  He has not had cheese at all.  Occ with an apple it can make his stomach feel weird. + constipation for 10d with irregular bowels but not using anything for that. Has cut out everything that is making him sick but it hasn't made any improvement.  Has 2 whiskey drinks at night but cut down from 6 - last night he has 1.  The alcohol helps it numb him and calm him down.  Eating sometimes triggers the pain, sometimes eating relief the pain and sometimes triggers.  He is on the prilosec for years in the morning, no heartburn.  Only taking pravastatin at night.  He is not sleeping well - only sleeps through the night 1 out of 7 nightes.  Most nights he wakes after 3-4 hours and will be up all through the night.  He has no idea what wakes him up - often wakes with dry mouth and can't get back to sleep - has been going on for a very long time.  Not taking any supplements or otc meds other than vit C.  Depression screen [facility]  2/9 05/25/2015 01/06/2015 12/02/2014  Decreased Interest 0 0 2  Down, Depressed, Hopeless 0 0 2  PHQ - 2 Score 0 0 4  Altered sleeping - - 3  Tired, decreased energy - - 3  Change in appetite - - 3  Feeling bad or failure about yourself  - - 1  Trouble concentrating - - 1  Moving slowly or fidgety/restless - - 1  Suicidal thoughts - - 0  PHQ-9 Score - - 16    Past Medical History  Diagnosis Date  . GERD (gastroesophageal reflux disease)   . Hypertension   . Diabetes mellitus without complication ([place])     Takes Metformin (05/11/15)  . Gallstone    Past Surgical History  Procedure Laterality Date  . Cardiac catheterization N/A 04/06/2015    Procedure: Left Heart Cath and Coronary Angiography;  Surgeon: Belva Crome, MD;  Location: [place] [facility];  Service: Cardiovascular;  Laterality: N/A;   Current Outpatient Prescriptions on File Prior to Visit  Medication Sig Dispense Refill  . Ascorbic Acid (VITAMIN C) 1000 MG tablet Take 1,000 mg by mouth daily.    Marland Kitchen aspirin EC 81 MG tablet Take 1 tablet (81 mg total)  by mouth daily.    Marland Kitchen lisinopril-hydrochlorothiazide (PRINZIDE,ZESTORETIC) 20-25 MG per tablet Take 1 tablet by mouth daily. 90 tablet 3   No current facility-administered medications on file prior to visit.   No Known Allergies Family History  Problem Relation Age of Onset  . Diabetes Father    Social History   Social History  . Marital Status: Divorced    Spouse Name: N/A  . Number of Children: N/A  . Years of Education: N/A   Social History Main Topics  . Smoking status: Never Smoker   . Smokeless tobacco: Current User     Comment: occasionally dips tobacco  . Alcohol Use: Yes     Comment: 2  drinks of liqour a night, has been cutting back.    . Drug Use: No  . Sexual Activity: Not on file   Other Topics Concern  . Not on file   Social History Narrative      Review of Systems  Constitutional: Positive for fatigue. Negative for fever, chills,  diaphoresis, activity change, appetite change and unexpected weight change.  Respiratory: Negative for chest tightness.   Cardiovascular: Negative for chest pain.  Gastrointestinal: Positive for nausea, vomiting, abdominal pain and constipation. Negative for diarrhea, abdominal distention and rectal pain.  Endocrine: Positive for polyuria. Negative for polydipsia and polyphagia.  Genitourinary: Positive for frequency and flank pain. Negative for dysuria, urgency, hematuria, discharge, enuresis, difficulty urinating and genital sores.  Musculoskeletal: Positive for back pain and arthralgias. Negative for myalgias, joint swelling and gait problem.  Skin: Negative for rash.  Neurological: Negative for weakness.  Hematological: Negative for adenopathy.  Psychiatric/Behavioral: Positive for sleep disturbance. Negative for dysphoric mood and agitation. The patient is not nervous/anxious.        Objective:  BP 148/89 mmHg  Pulse 80  Temp(Src) 98.4 F (36.9 C)  Resp 16  Ht 6\' 3"  (1.905 m)  Wt 410 lb (185.975 kg)  BMI 51.25 kg/m2  Physical Exam  Constitutional: He appears well-developed and well-nourished. No distress.  HENT:  Head: Normocephalic and atraumatic.  Neck: Normal range of motion. Neck supple. No thyromegaly present.  Cardiovascular: Normal rate, regular rhythm and normal heart sounds.   Pulmonary/Chest: Effort normal and breath sounds normal.  Abdominal: Soft. Normal appearance and bowel sounds are normal. He exhibits no distension and no mass. There is tenderness. There is no rebound, no guarding and no CVA tenderness. No hernia.  Exam sig limited by body habitus  Genitourinary: Rectum normal and prostate normal. Rectal exam shows no tenderness and anal tone normal. Guaiac negative stool.  Lymphadenopathy:    He has no cervical adenopathy.  Skin: He is not diaphoretic.   [facility] reading (PRIMARY) by  Dr. Brigitte Pulse. KUB: no acute abnormality    No sx improvement after GI cocktail  in office Assessment & Plan:   1. Generalized abdominal pain - has had CT, [place], xray without finding etiology, labs relatively unrevealing but new and not improvement so referral to GI asap.  Increase ppi to bid. Increase bentyl since seems to be helping. Start daily miralax for new constipation.  2. Essential hypertension, benign   3. Calculus of gallbladder without cholecystitis without obstruction - sxs atypical for gallbladder but with lack of other findings could contribute - consider surgery referral if sxs persist without etiology - pt declines to proceed with his today as would like to avoid any elevative surg if able. Could consider HIDA??  4. Hepatic steatosis  - perhaps stretching of liver  capsul causing pain? Refer to GI  5. Type 2 diabetes mellitus with hyperglycemia, without long-term current use of insulin (HCC)   6. Morbid obesity, unspecified obesity type ([place])   7. Polypharmacy   8. Alcohol use ([place]) - pt informed that he needs to stop drinking all alcohol - as long as he is still drinking etoh daily we do need to assume that this is likely causing gastritis - (pancreas imaging/labs nml) (reports down to 1-2 whiskey shots from 6 and that it is the only thing that makes his abd pain resolve). I also think this is cause of pt waking up with dry mouth and palpitations after 3-4 hrs of sleep but pt adament that these sxs do not resolve when he stops drinking.  I do suspect that pt has an alcohol addiction but pt denies and denies cage questions.  Advised pt can try ativan qhs instead of whiskey  9. Insomnia   10. Constipation, unspecified constipation type    Recheck w/ me in 1 wk, sooner if worse.   Orders Placed This Encounter  Procedures  . DG Abd 1 View    Standing Status: Future     Number of Occurrences: 1     Standing Expiration Date: 05/24/2016    Order Specific Question:  Reason for Exam (SYMPTOM  OR DIAGNOSIS REQUIRED)    Answer:  RUQ abdominal pain, constipation     Order Specific Question:  Preferred imaging location?    Answer:  External  . Helicobacter pylori antigen det, stool    Standing Status: Future     Number of Occurrences: 1     Standing Expiration Date: 05/24/2016  . Ambulatory referral to Gastroenterology    Referral Priority:  Urgent    Referral Type:  Consultation    Referral Reason:  Specialty Services Required    Number of Visits Requested:  1  . POC Hemoccult Bld/Stl (3-Cd Home Screen)    Standing Status: Future     Number of Occurrences:      Standing Expiration Date: 05/24/2016    Meds ordered this encounter  Medications  . gi cocktail (Maalox,Lidocaine,Donnatal)    Sig:   . polyethylene glycol powder (GLYCOLAX/MIRALAX) powder    Sig: Take 17 g by mouth 2 (two) times daily.    Dispense:  500 g    Refill:  1  . pantoprazole (PROTONIX) 40 MG tablet    Sig: Take 1 tablet (40 mg total) by mouth 2 (two) times daily before a meal.    Dispense:  60 tablet    Refill:  1  . LORazepam (ATIVAN) 1 MG tablet    Sig: Take 1 tablet (1 mg total) by mouth at bedtime.    Dispense:  20 tablet    Refill:  0  . sucralfate (CARAFATE) 1 G tablet    Sig: Take 1 tablet (1 g total) by mouth 4 (four) times daily -  with meals and at bedtime.    Dispense:  120 tablet    Refill:  0  . dicyclomine (BENTYL) 20 MG tablet    Sig: Take 1 tablet (20 mg total) by mouth 4 (four) times daily -  before meals and at bedtime.    Dispense:  60 tablet    Refill:  0   Over 40 min spent in face-to-face evaluation of and consultation with patient and coordination of care.  Over 50% of this time was spent counseling this patient.   Delman Cheadle, MD MPH

## 2015-05-26 ENCOUNTER — Other Ambulatory Visit: Payer: Self-pay | Admitting: Family Medicine

## 2015-05-27 ENCOUNTER — Other Ambulatory Visit: Payer: Self-pay | Admitting: Family Medicine

## 2015-05-27 LAB — HELICOBACTER PYLORI  SPECIAL ANTIGEN: H. PYLORI Antigen: NOT DETECTED

## 2015-05-29 ENCOUNTER — Ambulatory Visit (HOSPITAL_BASED_OUTPATIENT_CLINIC_OR_DEPARTMENT_OTHER): Payer: No Typology Code available for payment source | Attending: Internal Medicine

## 2015-06-01 ENCOUNTER — Ambulatory Visit (INDEPENDENT_AMBULATORY_CARE_PROVIDER_SITE_OTHER): Payer: No Typology Code available for payment source | Admitting: Family Medicine

## 2015-06-01 ENCOUNTER — Encounter: Payer: Self-pay | Admitting: Family Medicine

## 2015-06-01 VITALS — BP 162/101 | HR 88 | Temp 98.3°F | Resp 18 | Ht 75.0 in | Wt >= 6400 oz

## 2015-06-01 DIAGNOSIS — R1084 Generalized abdominal pain: Secondary | ICD-10-CM | POA: Diagnosis not present

## 2015-06-01 DIAGNOSIS — K5900 Constipation, unspecified: Secondary | ICD-10-CM | POA: Diagnosis not present

## 2015-06-01 DIAGNOSIS — I10 Essential (primary) hypertension: Secondary | ICD-10-CM

## 2015-06-01 DIAGNOSIS — E1165 Type 2 diabetes mellitus with hyperglycemia: Secondary | ICD-10-CM | POA: Diagnosis not present

## 2015-06-01 MED ORDER — LORAZEPAM 1 MG PO TABS: 1.0000 mg | ORAL_TABLET | Freq: Every day | ORAL | Status: DC

## 2015-06-01 NOTE — Progress Notes (Signed)
Subjective:    Patient ID: Chad [name], male    DOB: 1971-02-13, 44 y.o.   MRN: [id number] Chief Complaint  Patient presents with  . Follow-up    Stomach problems    HPI   3 wks of abd pain, went to the ER and had an abdominal CT scan and was given cipro for a bladder infection. 1 wk later it got worse and hge was voimiting all night - couldn't take it anymore so he want back to the ER and they did an [place] of his abdomejn. Was given a med for nausea and for abdominal spasms cramps.  At times, it doesn't matter what he eats, he will get immed abd and side pain with gas and on other days it is minimal enough to be annoying but as not as intense. But happens all the time. Abd pain goes through to his shoulder bladers his flank, and sometimes right underribs around diaphragm. Gets better after passing gas.  He has cut down on alcohol use and is eating soup, yogurt, fruits, veggies - nothing greasy or fried at all.  Occ has upset stomach with the probiotic yogurt which he eats in the morning.  He has not had cheese at all.  Occ with an apple it can make his stomach feel weird. + constipation for 10d with irregular bowels but not using anything for that. Has cut out everything that is making him sick but it hasn't made any improvement.  Has 2 whiskey drinks at night but cut down from 6 - last night he has 1.  The alcohol helps it numb him and calm him down.  Eating sometimes triggers the pain, sometimes eating relief the pain and sometimes triggers.   He is on the prilosec for years in the morning, no heartburn.  Only taking pravastatin at night.  He is not sleeping well - only sleeps through the night 1 out of 7 nightes.  Most nights he wakes after 3-4 hours and will be up all through the night.  He has no idea what wakes him up - often wakes with dry mouth and can't get back to sleep - has been going on for a very long time.  Not taking any supplements or otc meds other than vit C.    Has CT scan  of abd 10/27, [place] abd 11/3, XRay of abd 11/10. Has gallstones.  Neg h. Pori stool but is on ppi - had pt increase ppi to bid, started bid miralax with prny carafate. Refilled bentyl since it was helping some.  Tried lorazepam at hight instead of ativan.  h ad pt stop drinking alcohol all together, no nsiads, no tylenol. Had pt tri the GI cocktail in the offic e- no appreciable change with that Sent pt home with 3 card hemoccu Pt has an appoint with GI tomorrow.  a1c was 2 mos ago at 7.1 - normal during the day, occ up to 160-190 after eating - trying to do 4 small meals a day.  Eats heaviest meal in afternoon, then soup or salad  Did have some blood in his stool thae GI pains are on both the left and the right - some days he will having nother tand th next day sharp pains Probiotics  Is not sure he is taking the proxotnix, carafate might heave helped - not sure.  Mom checks bp outside of his house, big cuff - was 140/80 - stay off on metformin and pravstastin for now  until sxs have resolved.   Past Medical History  Diagnosis Date  . GERD (gastroesophageal reflux disease)   . Hypertension   . Diabetes mellitus without complication ([place])     Takes Metformin (05/11/15)  . Gallstone    Past Surgical History  Procedure Laterality Date  . Cardiac catheterization N/A 04/06/2015    Procedure: Left Heart Cath and Coronary Angiography;  Surgeon: Belva Crome, MD;  Location: [place] [facility];  Service: Cardiovascular;  Laterality: N/A;   Current Outpatient Prescriptions on File Prior to Visit  Medication Sig Dispense Refill  . Ascorbic Acid (VITAMIN C) 1000 MG tablet Take 1,000 mg by mouth daily.    Marland Kitchen aspirin EC 81 MG tablet Take 1 tablet (81 mg total) by mouth daily.    Marland Kitchen dicyclomine (BENTYL) 20 MG tablet Take 1 tablet (20 mg total) by mouth 4 (four) times daily -  before meals and at bedtime. 60 tablet 0  . glipiZIDE (GLUCOTROL XL) 5 MG 24 hr tablet TAKE 1 TABLET (5 MG) BY MOUTH DAILY WITH  BREAKFAST.  "OFFICE VISIT NEEDED 30 tablet 0  . lisinopril-hydrochlorothiazide (PRINZIDE,ZESTORETIC) 20-25 MG per tablet Take 1 tablet by mouth daily. 90 tablet 3  . sucralfate (CARAFATE) 1 G tablet Take 1 tablet (1 g total) by mouth 4 (four) times daily -  with meals and at bedtime. 120 tablet 0   No current facility-administered medications on file prior to visit.   No Known Allergies Family History  Problem Relation Age of Onset  . Diabetes Father    Social History   Social History  . Marital Status: Divorced    Spouse Name: N/A  . Number of Children: N/A  . Years of Education: N/A   Social History Main Topics  . Smoking status: Never Smoker   . Smokeless tobacco: Current User     Comment: occasionally dips tobacco  . Alcohol Use: Yes     Comment: 2  drinks of liqour a night, has been cutting back.    . Drug Use: No  . Sexual Activity: Not Asked   Other Topics Concern  . None   Social History Narrative     Review of Systems  Constitutional: Positive for activity change, appetite change and fatigue. Negative for fever, chills and diaphoresis.  HENT: Negative for trouble swallowing and voice change.   Respiratory: Negative for shortness of breath.   Cardiovascular: Negative for chest pain.  Gastrointestinal: Positive for nausea, vomiting, abdominal pain, constipation, blood in stool, abdominal distention and anal bleeding. Negative for diarrhea and rectal pain.  Genitourinary: Positive for flank pain. Negative for dysuria, frequency and decreased urine volume.  Hematological: Negative for adenopathy.  Psychiatric/Behavioral: Positive for sleep disturbance.       Objective:  BP 162/101 mmHg  Pulse 88  Temp(Src) 98.3 F (36.8 C) (Oral)  Resp 18  Ht 6\' 3"  (1.905 m)  Wt 410 lb (185.975 kg)  BMI 51.25 kg/m2  SpO2 98%  Physical Exam  Constitutional: He appears well-developed and well-nourished. No distress.  HENT:  Head: Normocephalic and atraumatic.  Neck:  Normal range of motion. Neck supple. No thyromegaly present.  Cardiovascular: Normal rate, regular rhythm and normal heart sounds.   Pulmonary/Chest: Effort normal and breath sounds normal.  Abdominal: Soft. Normal appearance and bowel sounds are normal. He exhibits no distension and no mass. There is no tenderness. There is no rebound, no guarding and no CVA tenderness. No hernia.  Genitourinary: Rectum normal and prostate normal. Rectal  exam shows no tenderness and anal tone normal. Guaiac negative stool.  Lymphadenopathy:    He has no cervical adenopathy.  Skin: He is not diaphoretic.          Assessment & Plan:   1. Generalized abdominal pain - has appt with GI tomorrow. Cont ppi. I suspect his could be due to gastritis, symptomatic cholelithiasis, symptomatic fatty liver, or other.  Really needs to stop EtOH but pt trying to cut down. Cont great diet changes  2. Essential hypertension, benign   3. Type 2 diabetes mellitus with hyperglycemia, without long-term current use of insulin ([place])   4. Morbid obesity, unspecified obesity type ([place])   5. Constipation, unspecified constipation type      Meds ordered this encounter  Medications  . LORazepam (ATIVAN) 1 MG tablet    Sig: Take 1 tablet (1 mg total) by mouth at bedtime.    Dispense:  20 tablet    Refill:  0     Delman Cheadle, MD MPH

## 2015-06-02 ENCOUNTER — Encounter: Payer: Self-pay | Admitting: Gastroenterology

## 2015-06-02 ENCOUNTER — Ambulatory Visit (INDEPENDENT_AMBULATORY_CARE_PROVIDER_SITE_OTHER): Payer: No Typology Code available for payment source | Admitting: Gastroenterology

## 2015-06-02 ENCOUNTER — Encounter (HOSPITAL_COMMUNITY): Payer: Self-pay | Admitting: *Deleted

## 2015-06-02 VITALS — BP 130/80 | HR 72 | Ht 75.0 in | Wt >= 6400 oz

## 2015-06-02 DIAGNOSIS — R1084 Generalized abdominal pain: Secondary | ICD-10-CM

## 2015-06-02 DIAGNOSIS — K625 Hemorrhage of anus and rectum: Secondary | ICD-10-CM

## 2015-06-02 DIAGNOSIS — K219 Gastro-esophageal reflux disease without esophagitis: Secondary | ICD-10-CM

## 2015-06-02 MED ORDER — NA SULFATE-K SULFATE-MG SULF 17.5-3.13-1.6 GM/177ML PO SOLN: 1.0000 | Freq: Once | ORAL | Status: DC

## 2015-06-02 NOTE — Patient Instructions (Signed)
You have been scheduled for an endoscopy and colonoscopy. Please follow the written instructions given to you at your visit today. Please pick up your prep supplies at the pharmacy, [facility], [address] [employer] .  If you use inhalers (even only as needed), please bring them with you on the day of your procedure. Your physician has requested that you go to www.startemmi.com and enter the access code given to you at your visit today. This web site gives a general overview about your procedure. However, you should still follow specific instructions given to you by our office regarding your preparation for the procedure.

## 2015-06-06 ENCOUNTER — Telehealth: Payer: Self-pay | Admitting: *Deleted

## 2015-06-06 NOTE — Telephone Encounter (Signed)
Spoke with patient and asked he move his procedure to 06/16/15 at 10:30 AM. He can do this. Spoke with Sharee Pimple and moved procedure as per MD request.

## 2015-06-14 ENCOUNTER — Other Ambulatory Visit: Payer: Self-pay | Admitting: Family Medicine

## 2015-06-15 ENCOUNTER — Encounter: Payer: Self-pay | Admitting: Gastroenterology

## 2015-06-15 DIAGNOSIS — K625 Hemorrhage of anus and rectum: Secondary | ICD-10-CM | POA: Insufficient documentation

## 2015-06-15 DIAGNOSIS — K219 Gastro-esophageal reflux disease without esophagitis: Secondary | ICD-10-CM | POA: Insufficient documentation

## 2015-06-15 DIAGNOSIS — R1084 Generalized abdominal pain: Secondary | ICD-10-CM | POA: Insufficient documentation

## 2015-06-15 NOTE — Progress Notes (Signed)
06/02/2015 Chad [name] [id number] Jan 03, 1971   HISTORY OF PRESENT ILLNESS:  This is a 44 year old male who is new to our practice and referred here by his PCP, Dr. Brigitte Pulse, for evaluation of rectal bleeding and abdominal pain.  Patient reports diffuse abdominal pain that comes and goes.  Described as sharp stabbing pains in different locations.  Also reports blood in his stool on a couple of occasions that is bright red and some a little dark red color.  Had some mild constipation as well recently, which is new for him.  No diarrhea.  Some nausea.  A lot of reflux recently as well.  Abdominal pain does not wake him at night.  All symptoms have only been present for the past 3-4 weeks and he did not have any GI issues prior to that.  Had a CT scan of the abdomen and pelvis on 10/27 that showed gallstones with sludge, prominent liver, and mild thickening of urinary bladder wall.  Ultrasound 11/3 was limited by body habitus, but showed only hepatic steatosis and cholelithiasis.  Stool for Hpylori was negative.  Lipase normal.  CBC with very mild leukocytosis but otherwise normal.  CMP with very slightly elevated total bili at 1.4 compared to 0.7 just 5 months ago.  He is on omeprazole 20 mg daily and has been taking carafate tablets four times daily for the past one week.  Has also been using bentyl 20 mg TID, which may help with the abdominal pains to some degree but not completely.   Just of note, he tells me that his father passed away recently and he seems upset and anxious about that.  ? If symptoms stem from this.    Past Medical History  Diagnosis Date  . GERD (gastroesophageal reflux disease)   . Hypertension   . Diabetes mellitus without complication ([place])     Takes Metformin (05/11/15)  . Gallstone    Past Surgical History  Procedure Laterality Date  . Cardiac catheterization N/A 04/06/2015    Procedure: Left Heart Cath and Coronary Angiography;  Surgeon: Belva Crome, MD;   Location: [place] [facility];  Service: Cardiovascular;  Laterality: N/A;    reports that he has never smoked. He uses smokeless tobacco. He reports that he drinks alcohol. He reports that he does not use illicit drugs. family history includes Diabetes in his father. No Known Allergies    Outpatient Encounter Prescriptions as of 06/02/2015  Medication Sig  . Ascorbic Acid (VITAMIN C) 1000 MG tablet Take 1,000 mg by mouth daily.  Marland Kitchen aspirin EC 81 MG tablet Take 1 tablet (81 mg total) by mouth daily.  Marland Kitchen dicyclomine (BENTYL) 20 MG tablet Take 1 tablet (20 mg total) by mouth 4 (four) times daily -  before meals and at bedtime.  Marland Kitchen glipiZIDE (GLUCOTROL XL) 5 MG 24 hr tablet TAKE 1 TABLET (5 MG) BY MOUTH DAILY WITH BREAKFAST.  "OFFICE VISIT NEEDED  . lisinopril-hydrochlorothiazide (PRINZIDE,ZESTORETIC) 20-25 MG per tablet Take 1 tablet by mouth daily.  . sucralfate (CARAFATE) 1 G tablet Take 1 tablet (1 g total) by mouth 4 (four) times daily -  with meals and at bedtime.  . [DISCONTINUED] carvedilol (COREG) 6.25 MG tablet TAKE 1 TABLET(6.25 MG) BY MOUTH TWICE DAILY WITH A MEAL  . [DISCONTINUED] LORazepam (ATIVAN) 1 MG tablet Take 1 tablet (1 mg total) by mouth at bedtime.  . [DISCONTINUED] metFORMIN (GLUCOPHAGE-XR) 500 MG 24 hr tablet Take 4 tablets (2,000 mg  total) by mouth daily with breakfast. (Patient not taking: Reported on 06/01/2015)  . [DISCONTINUED] Na Sulfate-K Sulfate-Mg Sulf SOLN Take 1 kit by mouth once.  . [DISCONTINUED] pantoprazole (PROTONIX) 40 MG tablet Take 1 tablet (40 mg total) by mouth 2 (two) times daily before a meal. (Patient not taking: Reported on 06/01/2015)  . [DISCONTINUED] polyethylene glycol powder (GLYCOLAX/MIRALAX) powder Take 17 g by mouth 2 (two) times daily. (Patient not taking: Reported on 06/01/2015)  . [DISCONTINUED] pravastatin (PRAVACHOL) 40 MG tablet Take 1 tablet (40 mg total) by mouth daily with supper.   No facility-administered encounter medications on  file as of 06/02/2015.     REVIEW OF SYSTEMS  : All other systems reviewed and negative except where noted in the History of Present Illness.   PHYSICAL EXAM: BP 130/80 mmHg  Pulse 72  Ht '6\' 3"'  (1.905 m)  Wt 410 lb 6 oz (186.145 kg)  BMI 51.29 kg/m2 General: Well developed white male in no acute distress Head: Normocephalic and atraumatic Eyes:  Sclerae anicteric, conjunctiva pink. Ears: Normal auditory acuity Lungs: Clear throughout to auscultation Heart: Regular rate and rhythm Abdomen: Soft, obese, non-distended.  Normal bowel sounds.  Mild diffuse TTP without R/R/G. Rectal:  Will be done at the time of colonoscopy. Musculoskeletal: Symmetrical with no gross deformities  Skin: No lesions on visible extremities Extremities: No edema  Neurological: Alert oriented x 4, grossly non-focal Psychological:  Alert and cooperative. Normal mood and affect  ASSESSMENT AND PLAN: -Rectal bleeding:  Likely outlet bleeding.  Will evaluate for other sources with colonoscopy, however. Offered empiric topical treatment for now but patient declined and I think that reassurance is really what he is looking for. -Generalized abdominal pain:  Likely IBS with negative CT scan and ultrasound recently.  Patient's father passed away recently so I think he is having some anxiety related to that.  Continue Dicyclomine 20 mg for now. -GERD:  Continue omeprazole daily and carafate four times daily for now.  Will also perform EGD.  *Patient's scheduled with Dr. Havery Moros at [facility] due to patient's BMI.  The risks, benefits, and alternatives to EGD and colonoscopy were discussed with the patient and he consents to proceed.   CC:  Shawnee Knapp, MD

## 2015-06-16 ENCOUNTER — Ambulatory Visit (HOSPITAL_COMMUNITY)
Admission: RE | Admit: 2015-06-16 | Payer: No Typology Code available for payment source | Source: Ambulatory Visit | Admitting: Gastroenterology

## 2015-06-16 SURGERY — COLONOSCOPY WITH PROPOFOL
Anesthesia: Monitor Anesthesia Care

## 2015-06-19 ENCOUNTER — Other Ambulatory Visit: Payer: Self-pay | Admitting: Family Medicine

## 2015-06-19 NOTE — Progress Notes (Signed)
Agree with assessment and plan as outlined.  

## 2015-06-28 ENCOUNTER — Telehealth: Payer: Self-pay | Admitting: *Deleted

## 2015-06-29 ENCOUNTER — Encounter: Payer: Self-pay | Admitting: *Deleted

## 2015-06-29 ENCOUNTER — Telehealth: Payer: Self-pay | Admitting: *Deleted

## 2015-06-29 ENCOUNTER — Other Ambulatory Visit: Payer: Self-pay | Admitting: *Deleted

## 2015-06-29 DIAGNOSIS — K219 Gastro-esophageal reflux disease without esophagitis: Secondary | ICD-10-CM

## 2015-06-29 DIAGNOSIS — K625 Hemorrhage of anus and rectum: Secondary | ICD-10-CM

## 2015-06-29 NOTE — Telephone Encounter (Signed)
-----   Message from Gatha Mayer, MD sent at 06/29/2015  3:04 PM EST ----- Set him up! ----- Message -----    From: Hulan Saas, RN    Sent: 06/29/2015   2:38 PM      To: Gatha Mayer, MD  Dr. Carlean Purl, This patient was seen by an extender. He was on Dr. Doyne Keel schedule for an EGD/colon at [facility] due to his BMI.(Dr. A has not seen the patient) He was cancelled twice when Dr. Havery Moros was out. He cannot do any time that Dr. Havery Moros has available. You have some spots on 07/25/15 Tues. Would you do this patient on that day? Rollene Fare

## 2015-06-29 NOTE — Telephone Encounter (Signed)
Rescheduled ECOL at [facility] on 07/14/15 at 7:45 M with 6:30 AM arrival. Left a message for patient to call back.

## 2015-06-29 NOTE — Telephone Encounter (Signed)
error 

## 2015-06-29 NOTE — Telephone Encounter (Signed)
Spoke with patient and he would rather be scheduled the first or second week of January.

## 2015-06-29 NOTE — Telephone Encounter (Signed)
Spoke with [facility] endo and scheduled patient with Dr. Carlean Purl on 07/25/15 at 10:15 AM. Patient aware. New instructions mailed to patient.

## 2015-07-11 ENCOUNTER — Other Ambulatory Visit: Payer: Self-pay | Admitting: Family Medicine

## 2015-07-12 ENCOUNTER — Encounter (HOSPITAL_COMMUNITY): Payer: Self-pay | Admitting: *Deleted

## 2015-07-13 ENCOUNTER — Encounter: Payer: Self-pay | Admitting: Family Medicine

## 2015-07-13 ENCOUNTER — Ambulatory Visit (INDEPENDENT_AMBULATORY_CARE_PROVIDER_SITE_OTHER): Payer: No Typology Code available for payment source | Admitting: Family Medicine

## 2015-07-13 VITALS — BP 144/87 | HR 93 | Temp 97.6°F | Resp 18 | Ht 75.0 in | Wt >= 6400 oz

## 2015-07-13 DIAGNOSIS — Z789 Other specified health status: Secondary | ICD-10-CM

## 2015-07-13 DIAGNOSIS — Z7289 Other problems related to lifestyle: Secondary | ICD-10-CM

## 2015-07-13 DIAGNOSIS — E1165 Type 2 diabetes mellitus with hyperglycemia: Secondary | ICD-10-CM

## 2015-07-13 DIAGNOSIS — K625 Hemorrhage of anus and rectum: Secondary | ICD-10-CM | POA: Diagnosis not present

## 2015-07-13 DIAGNOSIS — E785 Hyperlipidemia, unspecified: Secondary | ICD-10-CM | POA: Diagnosis not present

## 2015-07-13 DIAGNOSIS — R1084 Generalized abdominal pain: Secondary | ICD-10-CM | POA: Diagnosis not present

## 2015-07-13 DIAGNOSIS — F1090 Alcohol use, unspecified, uncomplicated: Secondary | ICD-10-CM

## 2015-07-13 DIAGNOSIS — Z79899 Other long term (current) drug therapy: Secondary | ICD-10-CM

## 2015-07-13 DIAGNOSIS — K219 Gastro-esophageal reflux disease without esophagitis: Secondary | ICD-10-CM | POA: Diagnosis not present

## 2015-07-13 DIAGNOSIS — I10 Essential (primary) hypertension: Secondary | ICD-10-CM | POA: Diagnosis not present

## 2015-07-13 DIAGNOSIS — E1140 Type 2 diabetes mellitus with diabetic neuropathy, unspecified: Secondary | ICD-10-CM

## 2015-07-13 DIAGNOSIS — K760 Fatty (change of) liver, not elsewhere classified: Secondary | ICD-10-CM | POA: Diagnosis not present

## 2015-07-13 LAB — COMPREHENSIVE METABOLIC PANEL
ALBUMIN: 4.3 g/dL (ref 3.6–5.1)
ALK PHOS: 39 U/L — AB (ref 40–115)
ALT: 33 U/L (ref 9–46)
AST: 23 U/L (ref 10–40)
BUN: 10 mg/dL (ref 7–25)
CHLORIDE: 97 mmol/L — AB (ref 98–110)
CO2: 27 mmol/L (ref 20–31)
CREATININE: 0.81 mg/dL (ref 0.60–1.35)
Calcium: 9.2 mg/dL (ref 8.6–10.3)
Glucose, Bld: 193 mg/dL — ABNORMAL HIGH (ref 65–99)
POTASSIUM: 4.4 mmol/L (ref 3.5–5.3)
Sodium: 136 mmol/L (ref 135–146)
TOTAL PROTEIN: 7.3 g/dL (ref 6.1–8.1)
Total Bilirubin: 0.8 mg/dL (ref 0.2–1.2)

## 2015-07-13 LAB — LIPID PANEL
Cholesterol: 140 mg/dL (ref 125–200)
HDL: 41 mg/dL (ref >=40)
LDL CALC: 63 mg/dL (ref <130)
Total CHOL/HDL Ratio: 3.4 Ratio (ref <=5.0)
Triglycerides: 182 mg/dL — ABNORMAL HIGH (ref <150)
VLDL: 36 mg/dL — AB (ref <30)

## 2015-07-13 LAB — POCT URINALYSIS DIP (MANUAL ENTRY)
BILIRUBIN UA: NEGATIVE
Blood, UA: NEGATIVE
Glucose, UA: NEGATIVE
LEUKOCYTES UA: NEGATIVE
Nitrite, UA: NEGATIVE
PH UA: 5.5
PROTEIN UA: NEGATIVE
SPEC GRAV UA: 1.025
Urobilinogen, UA: 0.2

## 2015-07-13 LAB — CBC
HEMATOCRIT: 46 % (ref 39.0–52.0)
Hemoglobin: 15.7 g/dL (ref 13.0–17.0)
MCH: 32.4 pg (ref 26.0–34.0)
MCHC: 34.1 g/dL (ref 30.0–36.0)
MCV: 94.8 fL (ref 78.0–100.0)
MPV: 10.4 fL (ref 8.6–12.4)
Platelets: 261 10*3/uL (ref 150–400)
RBC: 4.85 MIL/uL (ref 4.22–5.81)
RDW: 12.9 % (ref 11.5–15.5)
WBC: 9.8 10*3/uL (ref 4.0–10.5)

## 2015-07-13 LAB — POC MICROSCOPIC URINALYSIS (UMFC): MUCUS RE: ABSENT

## 2015-07-13 LAB — HEMOGLOBIN A1C
HEMOGLOBIN A1C: 6.9 % — AB (ref <5.7)
Mean Plasma Glucose: 151 mg/dL — ABNORMAL HIGH (ref <117)

## 2015-07-13 MED ORDER — SUCRALFATE 1 G PO TABS: 1.0000 g | ORAL_TABLET | Freq: Three times a day (TID) | ORAL | Status: DC

## 2015-07-13 MED ORDER — LISINOPRIL-HYDROCHLOROTHIAZIDE 20-25 MG PO TABS: 1.0000 | ORAL_TABLET | Freq: Every day | ORAL | Status: DC

## 2015-07-13 MED ORDER — OMEPRAZOLE 40 MG PO CPDR: 40.0000 mg | DELAYED_RELEASE_CAPSULE | Freq: Every day | ORAL | Status: DC

## 2015-07-13 MED ORDER — METFORMIN HCL ER 500 MG PO TB24: ORAL_TABLET | ORAL | Status: DC

## 2015-07-13 MED ORDER — GLIPIZIDE ER 5 MG PO TB24: ORAL_TABLET | ORAL | Status: DC

## 2015-07-13 MED ORDER — LORAZEPAM 1 MG PO TABS: 1.0000 mg | ORAL_TABLET | Freq: Every day | ORAL | Status: DC

## 2015-07-13 MED ORDER — CARVEDILOL 6.25 MG PO TABS: 6.2500 mg | ORAL_TABLET | Freq: Two times a day (BID) | ORAL | Status: DC

## 2015-07-13 MED ORDER — DICYCLOMINE HCL 10 MG PO CAPS: 10.0000 mg | ORAL_CAPSULE | Freq: Three times a day (TID) | ORAL | Status: DC

## 2015-07-13 NOTE — Progress Notes (Signed)
Subjective:    Patient ID: Chad [name], male    DOB: 1971-06-29, 44 y.o.   MRN: [id number] Chief Complaint  Patient presents with  . Follow-up  . Hypertension    HPI  He is very frustrated by the recurrent abdominal pain.  He is not sure which is helping but when he stops them he has bilateral lower chest and upper abdomen pain come back.  He has been off of the carvedilol for a week. When he went off of the carafate the bilateral sharp upper abd and lower chest pains came back.  Drinking more EtOH and sleeping less - after he drinks will wake in the middle of night with dry mouth and sweaty palms. When he tried the ativan after last mo he would drink 1-2 whiskey rather than 4-6 as the ativan would relax him and help him fall asleep, woke much better rested, less desire to drink. No a.m. Hangover or fatigue. Did try Lorrin Mais prior but left him in a fog during the day  only sees his daughter twice a year - over the summer for a montha nd over holidays and didn't see her this yr as he was thinking he would be getting his endoscopy.  Dad passed away in the last few mos so pt has started drinking a lot more EtOH to cope with his grief. Grief is gradually getting better.  Past Medical History  Diagnosis Date  . GERD (gastroesophageal reflux disease)   . Hypertension   . Diabetes mellitus without complication ([place])     Takes Metformin (05/11/15)  . Gallstone    Current Outpatient Prescriptions on File Prior to Visit  Medication Sig Dispense Refill  . Ascorbic Acid (VITAMIN C) 1000 MG tablet Take 1,000 mg by mouth daily.    Marland Kitchen aspirin EC 81 MG tablet Take 1 tablet (81 mg total) by mouth daily.     No current facility-administered medications on file prior to visit.   No Known Allergies  Review of Systems  Constitutional: Positive for activity change, appetite change and fatigue. Negative for fever, chills and unexpected weight change.  Cardiovascular: Positive for chest pain.  Negative for leg swelling.  Gastrointestinal: Positive for nausea, abdominal pain, diarrhea, anal bleeding and rectal pain. Negative for vomiting, constipation and abdominal distention.  Endocrine: Negative for polydipsia, polyphagia and polyuria.  Genitourinary: Negative for dysuria, urgency, decreased urine volume and enuresis.  Psychiatric/Behavioral: Positive for behavioral problems, sleep disturbance and dysphoric mood. Negative for suicidal ideas, confusion, self-injury and agitation.       Objective:  BP 144/87 mmHg  Pulse 93  Temp(Src) 97.6 F (36.4 C)  Resp 18  Ht 6\' 3"  (1.905 m)  Wt 413 lb (187.336 kg)  BMI 51.62 kg/m2  Physical Exam  Constitutional: He is oriented to person, place, and time. He appears well-developed and well-nourished. No distress.  HENT:  Head: Normocephalic and atraumatic.  Eyes: Conjunctivae are normal. Pupils are equal, round, and reactive to light. No scleral icterus.  Neck: Normal range of motion. Neck supple. No thyromegaly present.  Cardiovascular: Normal rate, regular rhythm, normal heart sounds and intact distal pulses.   Pulmonary/Chest: Effort normal and breath sounds normal. No respiratory distress.  Musculoskeletal: He exhibits no edema.  Lymphadenopathy:    He has no cervical adenopathy.  Neurological: He is alert and oriented to person, place, and time.  Skin: Skin is warm and dry. He is not diaphoretic.  Psychiatric: He has a normal mood and affect. His  behavior is normal.          Assessment & Plan:   1. Type 2 diabetes mellitus with hyperglycemia, without long-term current use of insulin (HCC) - pt admits to much worse dietary choices due to stress/medical problems. Does not want to increase his meds at this time as is going to work on [place].  2. Essential hypertension, benign - uncontrolled due to poor diet and alcohol binges as well as out of carvedilol x 1 wk - cont same meds and pt going to work on tlc  3. Generalized  abdominal pain - highly suspect due to gastritis from EtOH - has endoscopy next wk.  Restart prior meds as sxs did resolve when he was on ppi, carafte, bentyl and was not drinking as much EtOH  4. Morbid obesity, unspecified obesity type ([place])   5. Polypharmacy   6. Alcohol use ([place]) - pt does not want to stop drinking completely but willing to do it for a short time (sev mos) to get better control of medical problems and allow GI sxs to resolve. Recommend considering acamprosate.  He has quit for several mos sev times prev just by will power. He reports he did drink sig less when he was taking the ativan at night to help sleep so ok to restart.  7. Neuropathy due to type 2 diabetes mellitus ([place])   8. Gastroesophageal reflux disease without esophagitis   9. Rectal bleeding   10. Hepatic steatosis   11. Hyperlipidemia LDL goal <70 - i am having pt hold his pravastatin until his abd pain/GI issues resolve but ok to next mo after endoscopy.    Orders Placed This Encounter  Procedures  . Comprehensive metabolic panel  . CBC  . Lipid panel    Order Specific Question:  Has the patient fasted?    Answer:  Yes  . Hemoglobin A1c  . POCT urinalysis dipstick  . POCT Microscopic Urinalysis ([facility])    Meds ordered this encounter  Medications  . pravastatin (PRAVACHOL) 40 MG tablet    Sig: Take 40 mg by mouth daily.  Marland Kitchen DISCONTD: dicyclomine (BENTYL) 10 MG capsule    Sig: Take 10 mg by mouth 4 (four) times daily -  before meals and at bedtime. Reported on 07/13/2015  . dicyclomine (BENTYL) 10 MG capsule    Sig: Take 1 capsule (10 mg total) by mouth 4 (four) times daily -  before meals and at bedtime.    Dispense:  120 capsule    Refill:  5  . LORazepam (ATIVAN) 1 MG tablet    Sig: Take 1 tablet (1 mg total) by mouth at bedtime.    Dispense:  30 tablet    Refill:  2    Do not fill more frequent than every 25 days  . sucralfate (CARAFATE) 1 g tablet    Sig: Take 1 tablet (1 g total) by mouth 4  (four) times daily -  with meals and at bedtime.    Dispense:  120 tablet    Refill:  5  . omeprazole (PRILOSEC) 40 MG capsule    Sig: Take 1 capsule (40 mg total) by mouth daily.    Dispense:  90 capsule    Refill:  3  . glipiZIDE (GLUCOTROL XL) 5 MG 24 hr tablet    Sig: TAKE 1 TABLET (5 MG) BY MOUTH DAILY WITH BREAKFAST.    Dispense:  90 tablet    Refill:  1  . metFORMIN (GLUCOPHAGE-XR) 500 MG  24 hr tablet    Sig: Take 4 tablets once a day WITH BREAKFAST    Dispense:  360 tablet    Refill:  3  . carvedilol (COREG) 6.25 MG tablet    Sig: Take 1 tablet (6.25 mg total) by mouth 2 (two) times daily with a meal.    Dispense:  180 tablet    Refill:  1  . lisinopril-hydrochlorothiazide (PRINZIDE,ZESTORETIC) 20-25 MG tablet    Sig: Take 1 tablet by mouth daily.    Dispense:  90 tablet    Refill:  1     Delman Cheadle, MD MPH

## 2015-07-13 NOTE — Patient Instructions (Signed)
When you are ready, we both know that you will feel a lot better and be able to get off a lot of medications if you can cut down on your alcohol use in 2017.  There is a medicine called acamprosate that can be taken 3 times a day but it works on your brain to increase the neuron excitation that alcohol has decreased so decreases the desire to drink alcohol - where you probably are after you been off of it a month or two - so can make the first bit of stopping drinking easier mentally - less of a battle. It is not the one that makes you vomit if you take a drink. If you have not been able to cut out alcohol by our next visit, I think this would be a good thing for you to try.  I do think you need to cut out alcohol for a few months at least as you have done in the past to give your body time to heal.  Gastritis, Adult Gastritis is soreness and swelling (inflammation) of the lining of the stomach. Gastritis can develop as a sudden onset (acute) or long-term (chronic) condition. If gastritis is not treated, it can lead to stomach bleeding and ulcers. CAUSES  Gastritis occurs when the stomach lining is weak or damaged. Digestive juices from the stomach then inflame the weakened stomach lining. The stomach lining may be weak or damaged due to viral or bacterial infections. One common bacterial infection is the Helicobacter pylori infection. Gastritis can also result from excessive alcohol consumption, taking certain medicines, or having too much acid in the stomach.  SYMPTOMS  In some cases, there are no symptoms. When symptoms are present, they may include:  Pain or a burning sensation in the upper abdomen.  Nausea.  Vomiting.  An uncomfortable feeling of fullness after eating. DIAGNOSIS  Your caregiver may suspect you have gastritis based on your symptoms and a physical exam. To determine the cause of your gastritis, your caregiver may perform the following:  Blood or stool tests to check for the H  pylori bacterium.  Gastroscopy. A thin, flexible tube (endoscope) is passed down the esophagus and into the stomach. The endoscope has a light and camera on the end. Your caregiver uses the endoscope to view the inside of the stomach.  Taking a tissue sample (biopsy) from the stomach to examine under a microscope. TREATMENT  Depending on the cause of your gastritis, medicines may be prescribed. If you have a bacterial infection, such as an H pylori infection, antibiotics may be given. If your gastritis is caused by too much acid in the stomach, H2 blockers or antacids may be given. Your caregiver may recommend that you stop taking aspirin, ibuprofen, or other nonsteroidal anti-inflammatory drugs (NSAIDs). HOME CARE INSTRUCTIONS  Only take over-the-counter or prescription medicines as directed by your caregiver.  If you were given antibiotic medicines, take them as directed. Finish them even if you start to feel better.  Drink enough fluids to keep your urine clear or pale yellow.  Avoid foods and drinks that make your symptoms worse, such as:  Caffeine or alcoholic drinks.  Chocolate.  Peppermint or mint flavorings.  Garlic and onions.  Spicy foods.  Citrus fruits, such as oranges, lemons, or limes.  Tomato-based foods such as sauce, chili, salsa, and pizza.  Fried and fatty foods.  Eat small, frequent meals instead of large meals. SEEK IMMEDIATE MEDICAL CARE IF:   You have black or dark  red stools.  You vomit blood or material that looks like coffee grounds.  You are unable to keep fluids down.  Your abdominal pain gets worse.  You have a fever.  You do not feel better after 1 week.  You have any other questions or concerns. MAKE SURE YOU:  Understand these instructions.  Will watch your condition.  Will get help right away if you are not doing well or get worse.   This information is not intended to replace advice given to you by your health care provider.  Make sure you discuss any questions you have with your health care provider.   Document Released: 06/25/2001 Document Revised: 12/31/2011 Document Reviewed: 08/14/2011 Elsevier Interactive Patient Education 2016 [place] for Gastroesophageal Reflux Disease, Adult When you have gastroesophageal reflux disease (GERD), the foods you eat and your eating habits are very important. Choosing the right foods can help ease the discomfort of GERD. WHAT GENERAL GUIDELINES DO I NEED TO FOLLOW?  Choose fruits, vegetables, whole grains, low-fat dairy products, and low-fat meat, fish, and poultry.  Limit fats such as oils, salad dressings, butter, nuts, and avocado.  Keep a food diary to identify foods that cause symptoms.  Avoid foods that cause reflux. These may be different for different people.  Eat frequent small meals instead of three large meals each day.  Eat your meals slowly, in a relaxed setting.  Limit fried foods.  Cook foods using methods other than frying.  Avoid drinking alcohol.  Avoid drinking large amounts of liquids with your meals.  Avoid bending over or lying down until 2-3 hours after eating. WHAT FOODS ARE NOT RECOMMENDED? The following are some foods and drinks that may worsen your symptoms: Vegetables Tomatoes. Tomato juice. Tomato and spaghetti sauce. Chili peppers. Onion and garlic. Horseradish. Fruits Oranges, grapefruit, and lemon (fruit and juice). Meats High-fat meats, fish, and poultry. This includes hot dogs, ribs, ham, sausage, salami, and bacon. Dairy Whole milk and chocolate milk. Sour cream. Cream. Butter. Ice cream. Cream cheese.  Beverages Coffee and tea, with or without caffeine. Carbonated beverages or energy drinks. Condiments Hot sauce. Barbecue sauce.  Sweets/Desserts Chocolate and cocoa. Donuts. Peppermint and spearmint. Fats and Oils High-fat foods, including [place] fries and potato chips. Other Vinegar. Strong  spices, such as black pepper, white pepper, red pepper, cayenne, curry powder, cloves, ginger, and chili powder. The items listed above may not be a complete list of foods and beverages to avoid. Contact your dietitian for more information.   This information is not intended to replace advice given to you by your health care provider. Make sure you discuss any questions you have with your health care provider.   Document Released: 07/01/2005 Document Revised: 07/22/2014 Document Reviewed: 05/05/2013 Elsevier Interactive Patient Education 2016 [place] for Peptic Ulcer Disease When you have peptic ulcer disease, the foods you eat and your eating habits are very important. Choosing the right foods can help ease the discomfort of peptic ulcer disease. WHAT GENERAL GUIDELINES DO I NEED TO FOLLOW?  Choose fruits, vegetables, whole grains, and low-fat meat, fish, and poultry.   Keep a food diary to identify foods that cause symptoms.  Avoid foods that cause irritation or pain. These may be different for different people.  Eat frequent small meals instead of three large meals each day. The pain may be worse when your stomach is empty.  Avoid eating close to bedtime. WHAT FOODS ARE NOT RECOMMENDED? The following are  some foods and drinks that may worsen your symptoms:  Black, white, and red pepper.  Hot sauce.  Chili peppers.  Chili powder.  Chocolate and cocoa.   Alcohol.  Tea, coffee, and cola (regular and decaffeinated). The items listed above may not be a complete list of foods and beverages to avoid. Contact your dietitian for more information.   This information is not intended to replace advice given to you by your health care provider. Make sure you discuss any questions you have with your health care provider.   Document Released: 09/23/2011 Document Revised: 07/06/2013 Document Reviewed: 05/05/2013 Elsevier Interactive Patient Education [employer].

## 2015-07-25 ENCOUNTER — Ambulatory Visit (HOSPITAL_COMMUNITY)
Admission: RE | Admit: 2015-07-25 | Discharge: 2015-07-25 | Disposition: A | Discharge status: Home / Self Care | Payer: BLUE CROSS/BLUE SHIELD | Source: Ambulatory Visit | Attending: Gastroenterology | Admitting: Gastroenterology

## 2015-07-25 ENCOUNTER — Ambulatory Visit (HOSPITAL_COMMUNITY): Payer: BLUE CROSS/BLUE SHIELD | Admitting: Anesthesiology

## 2015-07-25 ENCOUNTER — Encounter (HOSPITAL_COMMUNITY)
Admission: RE | Disposition: A | Discharge status: Home / Self Care | Payer: BLUE CROSS/BLUE SHIELD | Source: Ambulatory Visit | Attending: Gastroenterology

## 2015-07-25 ENCOUNTER — Encounter (HOSPITAL_COMMUNITY): Payer: Self-pay

## 2015-07-25 DIAGNOSIS — I10 Essential (primary) hypertension: Secondary | ICD-10-CM | POA: Diagnosis not present

## 2015-07-25 DIAGNOSIS — E119 Type 2 diabetes mellitus without complications: Secondary | ICD-10-CM | POA: Diagnosis not present

## 2015-07-25 DIAGNOSIS — I2510 Atherosclerotic heart disease of native coronary artery without angina pectoris: Secondary | ICD-10-CM | POA: Diagnosis not present

## 2015-07-25 DIAGNOSIS — K625 Hemorrhage of anus and rectum: Secondary | ICD-10-CM | POA: Diagnosis present

## 2015-07-25 DIAGNOSIS — Z7984 Long term (current) use of oral hypoglycemic drugs: Secondary | ICD-10-CM | POA: Diagnosis not present

## 2015-07-25 DIAGNOSIS — R1084 Generalized abdominal pain: Secondary | ICD-10-CM

## 2015-07-25 DIAGNOSIS — R109 Unspecified abdominal pain: Secondary | ICD-10-CM | POA: Diagnosis not present

## 2015-07-25 DIAGNOSIS — D124 Benign neoplasm of descending colon: Secondary | ICD-10-CM | POA: Insufficient documentation

## 2015-07-25 DIAGNOSIS — D128 Benign neoplasm of rectum: Secondary | ICD-10-CM

## 2015-07-25 DIAGNOSIS — C187 Malignant neoplasm of sigmoid colon: Secondary | ICD-10-CM

## 2015-07-25 DIAGNOSIS — Z79899 Other long term (current) drug therapy: Secondary | ICD-10-CM | POA: Diagnosis not present

## 2015-07-25 DIAGNOSIS — Z6841 Body Mass Index (BMI) 40.0 and over, adult: Secondary | ICD-10-CM | POA: Diagnosis not present

## 2015-07-25 DIAGNOSIS — D127 Benign neoplasm of rectosigmoid junction: Secondary | ICD-10-CM | POA: Insufficient documentation

## 2015-07-25 DIAGNOSIS — K621 Rectal polyp: Secondary | ICD-10-CM | POA: Insufficient documentation

## 2015-07-25 DIAGNOSIS — Z7982 Long term (current) use of aspirin: Secondary | ICD-10-CM | POA: Insufficient documentation

## 2015-07-25 DIAGNOSIS — C186 Malignant neoplasm of descending colon: Secondary | ICD-10-CM | POA: Insufficient documentation

## 2015-07-25 DIAGNOSIS — K219 Gastro-esophageal reflux disease without esophagitis: Secondary | ICD-10-CM | POA: Diagnosis not present

## 2015-07-25 HISTORY — DX: Body Mass Index (BMI) 40.0 and over, adult: Z684

## 2015-07-25 HISTORY — DX: Alcohol abuse, uncomplicated: F10.10

## 2015-07-25 HISTORY — DX: Morbid (severe) obesity due to excess calories: E66.01

## 2015-07-25 HISTORY — DX: Hemorrhage of anus and rectum: K62.5

## 2015-07-25 HISTORY — DX: Fatty (change of) liver, not elsewhere classified: K76.0

## 2015-07-25 HISTORY — PX: COLONOSCOPY: SHX5424

## 2015-07-25 HISTORY — PX: ESOPHAGOGASTRODUODENOSCOPY: SHX5428

## 2015-07-25 LAB — GLUCOSE, CAPILLARY: Glucose-Capillary: 142 mg/dL — ABNORMAL HIGH (ref 65–99)

## 2015-07-25 SURGERY — COLONOSCOPY
Anesthesia: Monitor Anesthesia Care

## 2015-07-25 MED ORDER — SPOT INK MARKER SYRINGE KIT
PACK | SUBMUCOSAL | Status: AC
  Filled 2015-07-25: qty 5

## 2015-07-25 MED ORDER — PROPOFOL 10 MG/ML IV BOLUS
INTRAVENOUS | Status: AC
  Filled 2015-07-25: qty 40

## 2015-07-25 MED ORDER — SPOT INK MARKER SYRINGE KIT
PACK | SUBMUCOSAL | Status: DC | PRN
  Administered 2015-07-25: 8 mL via SUBMUCOSAL

## 2015-07-25 MED ORDER — ASPIRIN EC 81 MG PO TBEC: 81.0000 mg | DELAYED_RELEASE_TABLET | Freq: Every day | ORAL | Status: AC

## 2015-07-25 MED ORDER — PROPOFOL 10 MG/ML IV BOLUS
INTRAVENOUS | Status: DC | PRN
  Administered 2015-07-25: 50 mg via INTRAVENOUS
  Administered 2015-07-25 (×2): 20 mg via INTRAVENOUS
  Administered 2015-07-25: 30 mg via INTRAVENOUS
  Administered 2015-07-25 (×2): 20 mg via INTRAVENOUS
  Administered 2015-07-25: 50 mg via INTRAVENOUS
  Administered 2015-07-25 (×4): 20 mg via INTRAVENOUS
  Administered 2015-07-25: 50 mg via INTRAVENOUS
  Administered 2015-07-25: 20 mg via INTRAVENOUS
  Administered 2015-07-25: 30 mg via INTRAVENOUS
  Administered 2015-07-25: 20 mg via INTRAVENOUS
  Administered 2015-07-25: 60 mg via INTRAVENOUS
  Administered 2015-07-25 (×3): 20 mg via INTRAVENOUS
  Administered 2015-07-25: 50 mg via INTRAVENOUS
  Administered 2015-07-25 (×3): 20 mg via INTRAVENOUS
  Administered 2015-07-25: 50 mg via INTRAVENOUS
  Administered 2015-07-25 (×3): 20 mg via INTRAVENOUS
  Administered 2015-07-25: 30 mg via INTRAVENOUS
  Administered 2015-07-25: 20 mg via INTRAVENOUS
  Administered 2015-07-25 (×2): 50 mg via INTRAVENOUS
  Administered 2015-07-25 (×2): 20 mg via INTRAVENOUS

## 2015-07-25 MED ORDER — SODIUM CHLORIDE 0.9 % IV SOLN: INTRAVENOUS | Status: DC

## 2015-07-25 MED ORDER — PROPOFOL 10 MG/ML IV BOLUS
INTRAVENOUS | Status: AC
  Filled 2015-07-25: qty 20

## 2015-07-25 MED ORDER — LACTATED RINGERS IV SOLN
INTRAVENOUS | Status: DC
  Administered 2015-07-25: 11:00:00 via INTRAVENOUS
  Administered 2015-07-25: 1000 mL via INTRAVENOUS

## 2015-07-25 MED ORDER — BUTAMBEN-TETRACAINE-BENZOCAINE 2-2-14 % EX AERO
INHALATION_SPRAY | CUTANEOUS | Status: DC | PRN
  Administered 2015-07-25: 1 via TOPICAL

## 2015-07-25 NOTE — Op Note (Signed)
[facility] [place], [address]   COLONOSCOPY PROCEDURE REPORT  PATIENT: Chad [name], Chad [name]  MR#: [id number] BIRTHDATE: [birth year] , 44  yrs. old GENDER: male ENDOSCOPIST: Gatha Mayer, MD, [facility] PROCEDURE DATE:  07/25/2015 PROCEDURE:   Colonoscopy, diagnostic, Colonoscopy with biopsy, Colonoscopy with snare polypectomy, and Submucosal injection, any substance First Screening Colonoscopy - Avg.  risk and is 50 yrs.  old or older - No.  Prior Negative Screening - Now for repeat screening. N/A  History of Adenoma - Now for follow-up colonoscopy & has been > or = to 3 yrs.  N/A  Polyps removed today? Yes ASA CLASS:   Class III INDICATIONS:Evaluation of unexplained GI bleeding and Patient is not applicable for Colorectal Neoplasm Risk Assessment for this procedure. MEDICATIONS: Monitored anesthesia care, Per Anesthesia , and Residual sedation present  DESCRIPTION OF PROCEDURE:   After the risks benefits and alternatives of the procedure were thoroughly explained, informed consent was obtained.  The digital rectal exam revealed no abnormalities of the rectum, revealed no prostatic nodules, and revealed the prostate was not enlarged.   The Pentax Ped Colon [id number]  endoscope was introduced through the anus and advanced to the cecum, which was identified by both the appendix and ileocecal valve. No adverse events experienced.   The quality of the prep was good.  (MiraLax was used)  The instrument was then slowly withdrawn as the colon was fully examined. Estimated blood loss is zero unless otherwise noted in this procedure report.  COLON FINDINGS: 1) 1/4 circumference firm ulcerated mass in distal sigmmoid - approx 22 cm from verge.  Looks like cancer.  Biopsied. 2) 8 mm descending polyp and 5 mm rectosigmoid polyp removed cold snare, completely recovered and sent to path.  3) 18-20 mm pedunculated proximal rectal polyp removed hot snare and  completely recovered for pathology. 4) Otherwise normal colonoscopy. Retroflexed views revealed no abnormalities. The time to cecum = 2.3 Withdrawal time = 23.9   The scope was withdrawn and the procedure completed. COMPLICATIONS: There were no immediate complications.  ENDOSCOPIC IMPRESSION: 1) 1/4 circumference firm ulcerated mass in distal sigmmoid - approx 22 cm from verge.  Looks like cancer.  Biopsied. 2) 8 mm descending polyp and 5 mm rectosigmoid polyp removed cold snare, completely recovered and sent to path. 3) 18-20 mm pedunculated proximal rectal polyp removed hot snare and completely recovered for pathology. 4) Otherwise normal colonoscopy  RECOMMENDATIONS: 1.  Hold Aspirin and all other NSAIDS for 2 weeks. 2.  Will call pathology results and plans though anticipate surgical referral after pathology in.  CEA will be drawn at hospital today. He has had recent CT's of chest, and abd/pelvis - will see if he needs new ones - he may.  eSigned:  Gatha Mayer, MD, [facility] 07/25/2015 11:35 AM  cc: The Patient and Dr. Delman Cheadle

## 2015-07-25 NOTE — Anesthesia Postprocedure Evaluation (Signed)
Anesthesia Post Note  Patient: Chad [name]  Procedure(s) Performed: Procedure(s) (LRB): COLONOSCOPY (N/A) ESOPHAGOGASTRODUODENOSCOPY (EGD) (N/A)  Patient location during evaluation: PACU Anesthesia Type: MAC Level of consciousness: awake and alert Pain management: pain level controlled Vital Signs Assessment: post-procedure vital signs reviewed and stable Respiratory status: spontaneous breathing, nonlabored ventilation, respiratory function stable and patient connected to nasal cannula oxygen Cardiovascular status: stable and blood pressure returned to baseline Anesthetic complications: no    Last Vitals:  Filed Vitals:   07/25/15 0933  BP: 165/85  Pulse: 75  Temp: 36.8 C  Resp: 17    Last Pain: There were no vitals filed for this visit.               Catalina Gravel

## 2015-07-25 NOTE — Transfer of Care (Signed)
Immediate Anesthesia Transfer of Care Note  Patient: Chad [name]  Procedure(s) Performed: Procedure(s): COLONOSCOPY (N/A) ESOPHAGOGASTRODUODENOSCOPY (EGD) (N/A)  Patient Location: PACU  Anesthesia Type:MAC  Level of Consciousness: sedated  Airway & Oxygen Therapy: Patient Spontanous Breathing and Patient connected to nasal cannula oxygen  Post-op Assessment: Report given to RN and Post -op Vital signs reviewed and stable  Post vital signs: Reviewed and stable  Last Vitals:  Filed Vitals:   07/25/15 0933  BP: 165/85  Pulse: 75  Temp: 36.8 C  Resp: 17    Complications: No apparent anesthesia complications

## 2015-07-25 NOTE — Op Note (Signed)
[facility] [place], [address]   ENDOSCOPY PROCEDURE REPORT  PATIENT: Chad [name], Chad [name]  MR#: [id number] BIRTHDATE: 1970/12/03 , 44  yrs. old GENDER: male ENDOSCOPIST: Gatha Mayer, MD, [facility] PROCEDURE DATE:  07/25/2015 PROCEDURE:  EGD, diagnostic ASA CLASS:     Class III INDICATIONS:  abdominal pain. MEDICATIONS: Monitored anesthesia care and Per Anesthesia TOPICAL ANESTHETIC: Cetacaine Spray  DESCRIPTION OF PROCEDURE: After the risks benefits and alternatives of the procedure were thoroughly explained, informed consent was obtained.  The Pentax Gastroscope [id number] endoscope was introduced through the mouth and advanced to the second portion of the duodenum , Without limitations.  The instrument was slowly withdrawn as the mucosa was fully examined.      EXAM: The esophagus and gastroesophageal junction were completely normal in appearance.  The stomach was entered and closely examined.The antrum, angularis, and lesser curvature were well visualized, including a retroflexed view of the cardia and fundus. The stomach wall was normally distensable.  The scope passed easily through the pylorus into the duodenum.  Retroflexed views revealed no abnormalities.     The scope was then withdrawn from the patient and the procedure completed.  COMPLICATIONS: There were no immediate complications.  ENDOSCOPIC IMPRESSION: Normal appearing esophagus and GE junction, the stomach was well visualized and normal in appearance, normal appearing duodenum  RECOMMENDATIONS: Proceed with a Colonoscopy.  REPEAT EXAM:  eSigned:  Gatha Mayer, MD, [facility] 07/25/2015 11:27 AM    CC: Dr. Delman Cheadle and The Patient

## 2015-07-25 NOTE — Anesthesia Preprocedure Evaluation (Addendum)
Anesthesia Evaluation  Patient identified by MRN, date of birth, ID band Patient awake    Reviewed: Allergy & Precautions, NPO status , Patient's Chart, lab work & pertinent test results, reviewed documented beta blocker date and time   Airway Mallampati: II  TM Distance: >3 FB Neck ROM: Full    Dental  (+) Teeth Intact, Dental Advisory Given   Pulmonary neg pulmonary ROS,    Pulmonary exam normal breath sounds clear to auscultation       Cardiovascular hypertension, Pt. on medications and Pt. on home beta blockers (-) angina+ CAD  (-) Past MI Normal cardiovascular exam Rhythm:Regular Rate:Normal  LHC 04/06/15: 1.   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  Distal 40-50% LAD narrowing. Proximal LAD calcification.  No significant coronary obstruction  Left ventricular enlargement with global hypokinesis and an estimated ejection fraction of 45%. Etiology could include obesity, hypertension, and sleep apnea.  03/22/15 TTE: Study Conclusions  - Left ventricle: The cavity size was moderately dilated. There wasmild concentric hypertrophy. Systolic function was mildly tomoderately reduced. The estimated ejection fraction was in therange of 40% to 45%. There is akinesis of the anteroseptal andinferoseptal myocardium. - Aorta: Aortic root dimension: 48 mm (ED). - Ascending aorta: The ascending aorta was moderately dilated. - Left atrium: The atrium was moderately dilated. - Right ventricle: The cavity size was moderately dilated. Wallthickness was normal. - Right atrium: The atrium was mildly dilated.    Neuro/Psych negative neurological ROS  negative psych ROS   GI/Hepatic Neg liver ROS, GERD  Medicated,Rectal bleeding    Endo/Other  diabetes, Type 2, Oral Hypoglycemic AgentsMorbid obesity  Renal/GU negative Renal ROS     Musculoskeletal negative musculoskeletal ROS (+)   Abdominal   Peds  Hematology negative hematology  ROS (+)   Anesthesia Other Findings Day of surgery medications reviewed with the patient.  Reproductive/Obstetrics                           Anesthesia Physical Anesthesia Plan  ASA: III  Anesthesia Plan: MAC   Post-op Pain Management:    Induction: Intravenous  Airway Management Planned: Nasal Cannula  Additional Equipment:   Intra-op Plan:   Post-operative Plan:   Informed Consent: I have reviewed the patients History and Physical, chart, labs and discussed the procedure including the risks, benefits and alternatives for the proposed anesthesia with the patient or authorized representative who has indicated his/her understanding and acceptance.   Dental advisory given  Plan Discussed with: CRNA and Anesthesiologist  Anesthesia Plan Comments: (Discussed risks/benefits/alternatives to MAC sedation including need for ventilatory support, hypotension, need for conversion to general anesthesia.  All patient questions answered.  Patient wished to proceed.)        Anesthesia Quick Evaluation

## 2015-07-25 NOTE — H&P (Signed)
[facility] History and Physical   Primary Care Physician:  Delman Cheadle, MD   Reason for Procedure:   rectal bleeding, abdominal pain  Plan:    EGD and colonoscopy The risks and benefits as well as alternatives of endoscopic procedure(s) have been discussed and reviewed. All questions answered. The patient agrees to proceed.      HPI: Chad [name] is a 45 y.o. male with intermittent rectal bleeeding and abdominal pains - intermittent cramps and pinss and needles. Seen by Ceasar Mons PA-C in 05/2015 - see that for further details.   Past Medical History  Diagnosis Date  . GERD (gastroesophageal reflux disease)   . Hypertension   . Diabetes mellitus without complication ([place])     Takes Metformin (05/11/15)  . Gallstone     Past Surgical History  Procedure Laterality Date  . Cardiac catheterization N/A 04/06/2015    Procedure: Left Heart Cath and Coronary Angiography;  Surgeon: Belva Crome, MD;  Location: [facility];  Service: Cardiovascular;  Laterality: N/A;    Prior to Admission medications   Medication Sig Start Date End Date Taking? Authorizing Provider  Ascorbic Acid (VITAMIN C) 1000 MG tablet Take 1,000 mg by mouth daily.   Yes Historical Provider, MD  aspirin EC 81 MG tablet Take 1 tablet (81 mg total) by mouth daily. 03/01/14  Yes Shawnee Knapp, MD  carvedilol (COREG) 6.25 MG tablet Take 1 tablet (6.25 mg total) by mouth 2 (two) times daily with a meal. 07/13/15  Yes Shawnee Knapp, MD  dicyclomine (BENTYL) 10 MG capsule Take 1 capsule (10 mg total) by mouth 4 (four) times daily -  before meals and at bedtime. 07/13/15  Yes Shawnee Knapp, MD  glipiZIDE (GLUCOTROL XL) 5 MG 24 hr tablet TAKE 1 TABLET (5 MG) BY MOUTH DAILY WITH BREAKFAST. 07/13/15  Yes Shawnee Knapp, MD  lisinopril-hydrochlorothiazide (PRINZIDE,ZESTORETIC) 20-25 MG tablet Take 1 tablet by mouth daily. 07/13/15  Yes Shawnee Knapp, MD  LORazepam (ATIVAN) 1 MG tablet Take 1 tablet (1 mg total) by mouth at  bedtime. Patient taking differently: Take 1 mg by mouth at bedtime as needed for sleep.  07/13/15  Yes Shawnee Knapp, MD  metFORMIN (GLUCOPHAGE-XR) 500 MG 24 hr tablet Take 4 tablets once a day WITH BREAKFAST 07/13/15  Yes Shawnee Knapp, MD  nitroGLYCERIN (NITROSTAT) 0.4 MG SL tablet Place 0.4 mg under the tongue every 5 (five) minutes as needed for chest pain.   Yes Historical Provider, MD  omeprazole (PRILOSEC) 40 MG capsule Take 1 capsule (40 mg total) by mouth daily. 07/13/15  Yes Shawnee Knapp, MD  sucralfate (CARAFATE) 1 g tablet Take 1 tablet (1 g total) by mouth 4 (four) times daily -  with meals and at bedtime. 07/13/15  Yes Shawnee Knapp, MD    No current facility-administered medications for this encounter.    Allergies as of 06/28/2015  . (No Known Allergies)    Family History  Problem Relation Age of Onset  . Diabetes Father     Social History   Social History  . Marital Status: Legally Separated    Spouse Name: N/A  . Number of Children: N/A  . Years of Education: N/A   Occupational History  . Not on file.   Social History Main Topics  . Smoking status: Never Smoker   . Smokeless tobacco: Current User     Comment: occasionally dips tobacco  . Alcohol Use: Yes  Comment: 2  drinks of liqour a night, has been cutting back.    . Drug Use: No  . Sexual Activity: Not on file   Other Topics Concern  . Not on file   Social History Narrative    Review of Systems:  All other review of systems negative except as mentioned in the HPI.  Physical Exam: Vital signs in last 24 hours:     General:   Alert,  Well-developed, well-nourished, pleasant and cooperative in NAD Lungs:  Clear throughout to auscultation.   Heart:  Regular rate and rhythm; no murmurs, clicks, rubs,  or gallops. Abdomen:  Soft, nontender and nondistended. Normal bowel sounds.   Neuro/Psych:  Alert and cooperative. Normal mood and affect. A and O x 3   @Mava Suares  Simonne Maffucci, MD, [facility]astroenterology [phone] (pager) 07/25/2015 9:28 AM@

## 2015-07-25 NOTE — Discharge Instructions (Addendum)
° °  The main finding today is a tumor that looks like cancer, unfortunately. I also removed 3 polyps. I should know biopsy results by Thursday, probably tomorrow and will let you know and arrange surgery appointment as you will need the tumor removed by surgery.  You may need some CT scans also though just had some in past few months - I will check on that before we order them.  You do need a lab test today called a CEA level and we will obtain that.  Moderate Conscious Sedation, Adult, Care After Refer to this sheet in the next few weeks. These instructions provide you with information on caring for yourself after your procedure. Your health care provider may also give you more specific instructions. Your treatment has been planned according to current medical practices, but problems sometimes occur. Call your health care provider if you have any problems or questions after your procedure. WHAT TO EXPECT AFTER THE PROCEDURE  After your procedure:  You may feel sleepy, clumsy, and have poor balance for several hours.  Vomiting may occur if you eat too soon after the procedure. HOME CARE INSTRUCTIONS  Do not participate in any activities where you could become injured for at least 24 hours. Do not:  Drive.  Swim.  Ride a bicycle.  Operate heavy machinery.  Cook.  Use power tools.  Climb ladders.  Work from a high place.  Do not make important decisions or sign legal documents until you are improved.  If you vomit, drink water, juice, or soup when you can drink without vomiting. Make sure you have little or no nausea before eating solid foods.  Only take over-the-counter or prescription medicines for pain, discomfort, or fever as directed by your health care provider.  Make sure you and your family fully understand everything about the medicines given to you, including what side effects may occur.  You should not drink alcohol, take sleeping pills, or take medicines that  cause drowsiness for at least 24 hours.  If you smoke, do not smoke without supervision.  If you are feeling better, you may resume normal activities 24 hours after you were sedated.  Keep all appointments with your health care provider. SEEK MEDICAL CARE IF:  Your skin is pale or bluish in color.  You continue to feel nauseous or vomit.  Your pain is getting worse and is not helped by medicine.  You have bleeding or swelling.  You are still sleepy or feeling clumsy after 24 hours. SEEK IMMEDIATE MEDICAL CARE IF:  You develop a rash.  You have difficulty breathing.  You develop any type of allergic problem.  You have a fever. MAKE SURE YOU:  Understand these instructions.  Will watch your condition.  Will get help right away if you are not doing well or get worse.   This information is not intended to replace advice given to you by your health care provider. Make sure you discuss any questions you have with your health care provider.   Document Released: 04/21/2013 Document Revised: 07/22/2014 Document Reviewed: 04/21/2013 Elsevier Interactive Patient Education [employer].

## 2015-07-26 ENCOUNTER — Encounter (HOSPITAL_COMMUNITY): Payer: Self-pay | Admitting: Internal Medicine

## 2015-07-26 ENCOUNTER — Encounter: Payer: Self-pay | Admitting: Internal Medicine

## 2015-07-26 LAB — CEA: CEA: 0.7 ng/mL (ref 0.0–4.7)

## 2015-07-26 NOTE — Progress Notes (Signed)
Quick Note:  I spoke to him - he has colon cancer (sigmoid) and benign polyps  Needs: 1) Appt Dr. Leighton Ruff re: sigmoid cancer 2) recall colon 1 year 07/2016 3) I am going to ask dr. Marcello Moores if he needs repeat CT scans as he has had chest in 03/2015 and abd/pelvis 04/2015 ______

## 2015-07-26 NOTE — Progress Notes (Signed)
Quick Note:  Reviewed w/ Dr. Marcello Moores He should have repeat CT scanning chest/abd/pelvis with contrast Had labs late dec ______

## 2015-07-27 ENCOUNTER — Telehealth: Payer: Self-pay | Admitting: Internal Medicine

## 2015-07-27 ENCOUNTER — Encounter (HOSPITAL_COMMUNITY): Payer: Self-pay | Admitting: Internal Medicine

## 2015-07-27 NOTE — Telephone Encounter (Signed)
I spoke with the patient this am and gave him the information about appt for Dr. Marcello Moores 07/31/15.  I called patient again and he confirmed he has the appt info for Monday and plans to keep the appt. Claiborne Billings notified

## 2015-07-31 ENCOUNTER — Other Ambulatory Visit: Payer: Self-pay | Admitting: Surgery

## 2015-07-31 NOTE — H&P (Signed)
Chad [name] 07/31/2015 2:00 PM Location: [facility] Patient #: [id number] DOB: Sep 28, 1970 Single / Language: [name] / Race: White Male  History of Present Illness [name] MD; 07/31/2015 2:36 PM) The patient is a 45 year old male who presents with colorectal cancer. Note for "Colorectal cancer": Patient sent for surgical consultation by Dr. Silvano Rusk. Patient declined Dr. Leighton Ruff and wished a different surgeon. We fit him in my clinic today.  Pleasant morbidly obese male. Today with his brother. He has a history of diabetes controlled with oral hypoglycemics. 6.9 HgbA1C 3 weeks ago. He has had intermittent chest & abdominal pains in the past year. Had CT of chest abdomen showed mildly dilated aortic root. Underwent echocardiogram with dilated aortic root. Underwent cardiac catheterization with mild coronary disease but no significant lesions.  Head CT scan of abdomen and pelvis a few months ago that was underwhelming. Had an ultrasound that confirmed gallstones but no cholecystitis. Otherwise underwhelming. Has had some intermittent rectal bleeding. Was sent for gastroenterology consultation. Had upper endoscopy that was underwhelming. Patient does note he was started on proton pump inhibitor with some improvements in his symptoms. Had colonoscopy done. A few adenomatous polyps removed in proximal colon and rectum. More bulky mass noted at 22 cm from the anal verge. Biopsy consistent with adenocarcinoma. Surgical consultation requested.  His father recently passed away from pancreatic cancer at age 64. No other family history of breast, colon, ovarian cancer. No personal nor family history of GI/colon cancer, inflammatory bowel disease, irritable bowel syndrome, allergy such as Celiac Sprue, dietary/dairy problems, colitis, ulcers nor gastritis. No recent sick contacts/gastroenteritis. No travel outside the country. No changes in diet. No  dysphagia to solids or liquids. No significant heartburn or reflux. No hematochezia, hematemesis, coffee ground emesis. No evidence of prior gastric/peptic ulceration.   Other Problems [name], CMA; 07/31/2015 2:00 PM) Cholelithiasis Colon Cancer Depression Diabetes Mellitus Gastroesophageal Reflux Disease High blood pressure Vascular Disease  Past Surgical History [name], CMA; 07/31/2015 2:00 PM) No pertinent past surgical history  Diagnostic Studies History [name], CMA; 07/31/2015 2:00 PM) Colonoscopy within last year  Allergies [name], CMA; 07/31/2015 2:00 PM) No Known Drug Allergies 07/31/2015  Medication History [name], CMA; 07/31/2015 2:01 PM) Carvedilol (6.25MG  Tablet, Oral) Active. Dicyclomine HCl (10MG  Capsule, Oral) Active. GlipiZIDE ER (5MG  Tablet ER 24HR, Oral) Active. Lisinopril-Hydrochlorothiazide (20-25MG  Tablet, Oral) Active. LORazepam (1MG  Tablet, Oral) Active. Omeprazole (40MG  Capsule DR, Oral) Active. Ascorbic Acid (1000MG  Tablet, Oral) Active. Aspirin (81MG  Tablet, Oral) Active. Medications Reconciled  Social History [name], [place]; 07/31/2015 2:00 PM) Alcohol use Heavy alcohol use. Caffeine use Carbonated beverages, Tea. No drug use Tobacco use Never smoker.  Family History [name], [place]; 07/31/2015 2:00 PM) Diabetes Mellitus Father, Mother. Heart Disease Father. Hypertension Father. Malignant Neoplasm Of Pancreas Father.     Review of Systems [name] CMA; 07/31/2015 2:00 PM) General Not Present- Appetite Loss, Chills, Fatigue, Fever, Night Sweats, Weight Gain and Weight Loss. Skin Not Present- Change in Wart/Mole, Dryness, Hives, Jaundice, New Lesions, Non-Healing Wounds, Rash and Ulcer. HEENT Not Present- Earache, Hearing Loss, Hoarseness, Nose Bleed, Oral Ulcers, Ringing in the Ears, Seasonal Allergies, Sinus Pain, Sore Throat, Visual Disturbances, Wears glasses/contact lenses and  Yellow Eyes. Respiratory Not Present- Bloody sputum, Chronic Cough, Difficulty Breathing, Snoring and Wheezing. Cardiovascular Present- Chest Pain. Not Present- Difficulty Breathing Lying Down, Leg Cramps, Palpitations, Rapid Heart Rate, Shortness of Breath and Swelling of Extremities. Gastrointestinal Present- Bloody Stool and Chronic  diarrhea. Not Present- Abdominal Pain, Bloating, Change in Bowel Habits, Constipation, Difficulty Swallowing, Excessive gas, Gets full quickly at meals, Hemorrhoids, Indigestion, Nausea, Rectal Pain and Vomiting. Male Genitourinary Not Present- Blood in Urine, Change in Urinary Stream, Frequency, Impotence, Nocturia, Painful Urination, Urgency and Urine Leakage. Musculoskeletal Present- Back Pain and Joint Pain. Not Present- Joint Stiffness, Muscle Pain, Muscle Weakness and Swelling of Extremities. Neurological Present- Headaches. Not Present- Decreased Memory, Fainting, Numbness, Seizures, Tingling, Tremor, Trouble walking and Weakness. Psychiatric Present- Anxiety, Change in Sleep Pattern, Depression and Frequent crying. Not Present- Bipolar and Fearful. Hematology Not Present- Easy Bruising, Excessive bleeding, Gland problems, HIV and Persistent Infections.  Vitals [name] CMA; 07/31/2015 2:01 PM) 07/31/2015 2:01 PM Weight: 403 lb Height: 75in Body Surface Area: 2.96 m Body Mass Index: 50.37 kg/m  Temp.: 98.38F(Temporal)  Pulse: 90 (Regular)  BP: 142/86 (Sitting, Left Arm, Standard)      Physical Exam [name] MD; 07/31/2015 2:37 PM)  General Mental Status-Alert. General Appearance-Not in acute distress, Not Sickly. Orientation-Oriented X3. Hydration-Well hydrated. Voice-Normal.  Integumentary Global Assessment Upon inspection and palpation of skin surfaces of the - Axillae: non-tender, no inflammation or ulceration, no drainage. and Distribution of scalp and body hair is normal. General  Characteristics Temperature - normal warmth is noted.  Head and Neck Head-normocephalic, atraumatic with no lesions or palpable masses. Face Global Assessment - atraumatic, no absence of expression. Neck Global Assessment - no abnormal movements, no bruit auscultated on the right, no bruit auscultated on the left, no decreased range of motion, non-tender. Trachea-midline. Thyroid Gland Characteristics - non-tender.  Eye Eyeball - Left-Extraocular movements intact, No Nystagmus. Eyeball - Right-Extraocular movements intact, No Nystagmus. Cornea - Left-No Hazy. Cornea - Right-No Hazy. Sclera/Conjunctiva - Left-No scleral icterus, No Discharge. Sclera/Conjunctiva - Right-No scleral icterus, No Discharge. Pupil - Left-Direct reaction to light normal. Pupil - Right-Direct reaction to light normal.  ENMT Ears Pinna - Left - no drainage observed, no generalized tenderness observed. Right - no drainage observed, no generalized tenderness observed. Nose and Sinuses External Inspection of the Nose - no destructive lesion observed. Inspection of the nares - Left - quiet respiration. Right - quiet respiration. Mouth and Throat Lips - Upper Lip - no fissures observed, no pallor noted. Lower Lip - no fissures observed, no pallor noted. Nasopharynx - no discharge present. Oral Cavity/Oropharynx - Tongue - no dryness observed. Oral Mucosa - no cyanosis observed. Hypopharynx - no evidence of airway distress observed.  Chest and Lung Exam Inspection Movements - Normal and Symmetrical. Accessory muscles - No use of accessory muscles in breathing. Palpation Palpation of the chest reveals - Non-tender. Auscultation Breath sounds - Normal and Clear.  Cardiovascular Auscultation Rhythm - Regular. Murmurs & Other Heart Sounds - Auscultation of the heart reveals - No Murmurs and No Systolic Clicks.  Abdomen Inspection Inspection of the abdomen reveals - No Visible peristalsis  and No Abnormal pulsations. Umbilicus - No Bleeding, No Urine drainage. Palpation/Percussion Palpation and Percussion of the abdomen reveal - Soft, Non Tender, No Rebound tenderness, No Rigidity (guarding) and No Cutaneous hyperesthesia. Note: Morbid obesity. Mild diastases recti. Sensitive umbilicus but no hernia. Mild red heat rash on his abdomen but no ulcerations nor blistering.  Male Genitourinary Sexual Maturity Tanner 5 - Adult hair pattern and Adult penile size and shape. Note: Normal external genitalia. Epididymi, testes, and spermatic cords normal without any masses. No inguinal hernias.  Rectal Note: Deferred given recent colonoscopy.  Peripheral Vascular Upper Extremity Inspection -  Left - No Cyanotic nailbeds, Not Ischemic. Right - No Cyanotic nailbeds, Not Ischemic.  Neurologic Neurologic evaluation reveals -normal attention span and ability to concentrate, able to name objects and repeat phrases. Appropriate fund of knowledge , normal sensation and normal coordination. Mental Status Affect - not angry, not paranoid. Cranial Nerves-Normal Bilaterally. Gait-Normal.  Neuropsychiatric Mental status exam performed with findings of-able to articulate well with normal speech/language, rate, volume and coherence, thought content normal with ability to perform basic computations and apply abstract reasoning and no evidence of hallucinations, delusions, obsessions or homicidal/suicidal ideation.  Musculoskeletal Global Assessment Spine, Ribs and Pelvis - no instability, subluxation or laxity. Right Upper Extremity - no instability, subluxation or laxity.  Lymphatic Head & Neck  General Head & Neck Lymphatics: Bilateral - Description - No Localized lymphadenopathy. Axillary  General Axillary Region: Bilateral - Description - No Localized lymphadenopathy. Femoral & Inguinal  Generalized Femoral & Inguinal Lymphatics: Left - Description - No Localized  lymphadenopathy. Right - Description - No Localized lymphadenopathy.    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 22 cm from anal verge. Consistent with adenocarcinoma.  There is no evidence of any local nor distant disease based on CT of chest abdomen and pelvis done just a few months before. CEA is normal.  I think he would benefit from segmental colonic resection. Good candidate for robotic surgery given his morbid obesity to minimize his incision. We'll do on table rigid proctoscopy to make sure it is not truly in the rectum. Allow a chance to tattoo the cacner as well.  Would like blessing from cardiology. Sounds like his aggressive workup for atypical chest pain was underwhelming in September. However, want to minimize any issues.  ENCOUNTER FOR PREOPERATIVE EXAMINATION FOR GENERAL SURGICAL PROCEDURE (Z01.818)  Current Plans You are being scheduled for surgery - Our schedulers will call you.  You should hear from our office's scheduling department within 5 working days about the location, date, and time of surgery. We try to make accommodations for patient's preferences in scheduling surgery, but sometimes the OR schedule or the surgeon's schedule prevents [place] from making those accommodations.  If you have not heard from our office [phone]) in 5 working days, call the office and ask for your surgeon's nurse.  If you have other questions about your diagnosis, plan, or surgery, call the office and ask for your surgeon's nurse.  Written instructions provided Pt Education - [facility] Colon Bowel Prep 2015 Miralax/Antibiotics Started Neomycin Sulfate 500MG , 2 (two) Tablet SEE NOTE, #6, 07/31/2015, No Refill. Local Order: TAKE TWO TABLETS AT 2 PM, 3 PM, AND 10 PM THE DAY PRIOR TO SURGERY Started Flagyl 500MG , 2 (two) Tablet SEE NOTE, #6, 07/31/2015, No Refill. Local Order: Take at 2pm, 3pm, and  10pm the day prior to your colon operation Pt Education - [facility] Colectomy post-op instructions: discussed with patient and provided information. Pt Education - Pamphlet Given - Laparoscopic Colorectal Surgery: discussed with patient and provided information. Pt Education - [place] (Jalyiah Shelley) Pt Education - [facility] Pain Control (Lizzie An) I recommended obtaining preoperative cardiac clearance. I am concerned about the health of the patient and the ability to tolerate the operation. Therefore, we will request clearance by cardiology to better assess operative risk & see if a reevaluation, further workup, etc is needed. Had a rather thorough workup in September. Does have dilated aortic root but no major valvular insufficiency. Also  recommendations on how medications such as for anticoagulation and blood pressure should be managed/held/restarted after surgery.  [name], M.D., F.A.C.S. Gastrointestinal and Minimally Invasive Surgery [facility] [address], [place], [place] [id number] [phone] Main / Paging

## 2015-07-31 NOTE — Progress Notes (Signed)
Pt seen in clinic fall 2016  Underwent cath in September  This showed 50% distal LAD lesion   LVEF 45 to 50% Overall I think pt is at low risk for major cardiac event with planned GI surgery and is OK to proceed.

## 2015-08-07 ENCOUNTER — Telehealth: Payer: Self-pay | Admitting: *Deleted

## 2015-08-07 NOTE — Telephone Encounter (Signed)
Surgical clearance form along with recent OV note and cath report faxed to [facility] at this time.

## 2015-08-09 NOTE — Progress Notes (Signed)
Agree - pt does not need further eval from me for surgical clearance.  Encouraged pt to cut out EtOH entirely - he has cut down to 0-3 liquor drinks/night from 5-8.  He uses the lorazepam on evenings that he is not drinking EtOH - sometimes puts him to sleep and sometimes will not be able to fall asleep for 3-4 hrs. Ok to use lorazepam 1mg  qhs if not drinking EtOH and can repeat dose x 1 1-2 hrs later if not yet asleep.  Pt reports his cbgs have been improved - highest 140 - he is trying to eat healthy. He is depressed but trying to keep his mood positive - is looking forward to trying to get surgery asap so he can stop worrying about it spreading or worsening morbidity/mortality.

## 2015-09-27 ENCOUNTER — Other Ambulatory Visit: Payer: Self-pay | Admitting: Family Medicine

## 2015-09-27 NOTE — Telephone Encounter (Signed)
ERROR

## 2015-10-11 NOTE — Progress Notes (Deleted)
   Subjective:    Patient ID: Chad [name], male    DOB: [birth year], 45 y.o.   MRN: [id number]  HPI Chad [name] is a 45 yo male who is here today for follow-up of his chronic medical conditions. Is scheduled for robot assisted sigmoid colectomy next wk on 4/5 with Chad [name] for colon cancer  Type 2 DM: 7.1 -> 6.9 ->  Lipids done 3 mos prior. Urine microalb done May 2016.  Eye exam? On metformin ER 2000u/d and glipizide xl 5mg  qam EtOH use: offered pt acamprosate at last visit. CHF: Saw cardiology Chad [name] 6 mo prior HTN: On corect 6.25 bid, lisinopril-hctz 20-15. HPL: goal <70 - at goal when last checked 3 mos prior.  On pravastatin  Review of Systems     Objective:   Physical Exam        Assessment & Plan:  Needs pneumovax-23 today (none prior and pt with DM) and flu shot

## 2015-10-12 ENCOUNTER — Encounter: Payer: No Typology Code available for payment source | Admitting: Family Medicine

## 2015-10-13 ENCOUNTER — Encounter (HOSPITAL_COMMUNITY): Payer: Self-pay

## 2015-10-13 ENCOUNTER — Encounter (HOSPITAL_COMMUNITY)
Admission: RE | Admit: 2015-10-13 | Discharge: 2015-10-13 | Disposition: A | Discharge status: Home / Self Care | Payer: BLUE CROSS/BLUE SHIELD | Source: Ambulatory Visit | Attending: Surgery | Admitting: Surgery

## 2015-10-13 DIAGNOSIS — Z01818 Encounter for other preprocedural examination: Secondary | ICD-10-CM | POA: Insufficient documentation

## 2015-10-13 DIAGNOSIS — C20 Malignant neoplasm of rectum: Secondary | ICD-10-CM | POA: Diagnosis not present

## 2015-10-13 HISTORY — DX: Atherosclerotic heart disease of native coronary artery without angina pectoris: I25.10

## 2015-10-13 HISTORY — DX: Malignant (primary) neoplasm, unspecified: C80.1

## 2015-10-13 HISTORY — DX: Pain in left shoulder: M25.512

## 2015-10-13 HISTORY — DX: Pain in right shoulder: M25.511

## 2015-10-13 LAB — BASIC METABOLIC PANEL
Anion gap: 12 (ref 5–15)
BUN: 13 mg/dL (ref 6–20)
CO2: 26 mmol/L (ref 22–32)
Calcium: 9.5 mg/dL (ref 8.9–10.3)
Chloride: 98 mmol/L — ABNORMAL LOW (ref 101–111)
Creatinine, Ser: 0.77 mg/dL (ref 0.61–1.24)
GFR calc Af Amer: >60 mL/min (ref >60)
GLUCOSE: 236 mg/dL — AB (ref 65–99)
POTASSIUM: 4.2 mmol/L (ref 3.5–5.1)
Sodium: 136 mmol/L (ref 135–145)

## 2015-10-13 LAB — CBC
HCT: 46.1 % (ref 39.0–52.0)
Hemoglobin: 15.8 g/dL (ref 13.0–17.0)
MCH: 32.2 pg (ref 26.0–34.0)
MCHC: 34.3 g/dL (ref 30.0–36.0)
MCV: 93.9 fL (ref 78.0–100.0)
PLATELETS: 274 10*3/uL (ref 150–400)
RBC: 4.91 MIL/uL (ref 4.22–5.81)
RDW: 12.8 % (ref 11.5–15.5)
WBC: 11.8 10*3/uL — ABNORMAL HIGH (ref 4.0–10.5)

## 2015-10-13 LAB — ABO/RH: ABO/RH(D): A POS

## 2015-10-13 NOTE — Patient Instructions (Addendum)
YOUR PROCEDURE IS SCHEDULED ON : 10/18/15  REPORT TO [facility] MAIN ENTRANCE FOLLOW SIGNS TO EAST ELEVATOR - GO TO 3rd FLOOR CHECK IN AT 3 EAST NURSES STATION (SHORT STAY) AT:  6:30 AM  CALL THIS NUMBER IF YOU HAVE PROBLEMS THE MORNING OF SURGERY [phone]  REMEMBER:ONLY 1 PER PERSON MAY GO TO SHORT STAY WITH YOU TO GET READY THE MORNING OF YOUR SURGERY  DO NOT EAT FOOD OR DRINK LIQUIDS AFTER MIDNIGHT  TAKE THESE MEDICINES THE MORNING OF SURGERY: CARVEDILOL / OMEPRAZOLE / DICYCLOMINE / SUCRALFATE  FOLLOW BOWEL PREP INSTRUCTIONS  STOP ASPIRIN / IBUPROFEN / ALEVE / VITAMINS / HERBAL MEDS __5__ DAYS BEFORE SURGERY  YOU MAY NOT HAVE ANY METAL ON YOUR BODY INCLUDING HAIR PINS AND PIERCING'S. DO NOT WEAR JEWELRY, MAKEUP, LOTIONS, POWDERS OR PERFUMES. DO NOT WEAR NAIL POLISH. DO NOT SHAVE 48 HRS PRIOR TO SURGERY. MEN MAY SHAVE FACE AND NECK.  DO NOT [place]. [facility] IS NOT RESPONSIBLE FOR VALUABLES.  CONTACTS, DENTURES OR PARTIALS MAY NOT BE WORN TO SURGERY. LEAVE SUITCASE IN CAR. CAN BE BROUGHT TO ROOM AFTER SURGERY.  PATIENTS DISCHARGED THE DAY OF SURGERY WILL NOT BE ALLOWED TO DRIVE HOME.  PLEASE READ OVER THE FOLLOWING INSTRUCTION SHEETS _________________________________________________________________________________                                          [facility] - PREPARING FOR SURGERY  Before surgery, you can play an important role.  Because skin is not sterile, your skin needs to be as free of germs as possible.  You can reduce the number of germs on your skin by washing with CHG (chlorahexidine gluconate) soap before surgery.  CHG is an antiseptic cleaner which kills germs and bonds with the skin to continue killing germs even after washing. Please DO NOT use if you have an allergy to CHG or antibacterial soaps.  If your skin becomes reddened/irritated stop using the CHG and inform your nurse when you arrive at Short Stay. Do  not shave (including legs and underarms) for at least 48 hours prior to the first CHG shower.  You may shave your face. Please follow these instructions carefully:   1.  Shower with CHG Soap the night before surgery and the  morning of Surgery.   2.  If you choose to wash your hair, wash your hair first as usual with your  normal  Shampoo.   3.  After you shampoo, rinse your hair and body thoroughly to remove the  shampoo.                                         4.  Use CHG as you would any other liquid soap.  You can apply chg directly  to the skin and wash . Gently wash with scrungie or clean wascloth    5.  Apply the CHG Soap to your body ONLY FROM THE NECK DOWN.   Do not use on open                           Wound or open sores. Avoid contact with eyes, ears mouth and genitals (private parts).  Genitals (private parts) with your normal soap.              6.  Wash thoroughly, paying special attention to the area where your surgery  will be performed.   7.  Thoroughly rinse your body with warm water from the neck down.   8.  DO NOT shower/wash with your normal soap after using and rinsing off  the CHG Soap .                9.  Pat yourself dry with a clean towel.             10.  Wear clean night clothes to bed after shower             11.  Place clean sheets on your bed the night of your first shower and do not  sleep with pets.  Day of Surgery : Do not apply any lotions/deodorants the morning of surgery.  Please wear clean clothes to the hospital/surgery center.  FAILURE TO FOLLOW THESE INSTRUCTIONS MAY RESULT IN THE CANCELLATION OF YOUR SURGERY    PATIENT SIGNATURE_________________________________  ______________________________________________________________________   COLON BOWEL PREP Please follow the instructions carefully. It is important to clean out your bowels & take the prescribed antibiotic pills to lower your chances of a wound infection  or abscess.   FIVE DAYS PRIOR TO YOUR SURGERY Stop eating any nuts, popcorn, or fruit with seeds. Stop all fiber supplements such as Metamucil, Citrucel, etc.   Hold taking any blood thinning anticoagulation medication (ex: aspirin, warfarin/Coumadin, Plavix, Xarelto, Eliquis, Pradaxa, etc) as recommended by your medical/cardiology doctor  Obtain what you need at a pharmacy of your choice: -Filled out prescriptions for your oral antibiotics (Neomycin & Metronidazole)  -A bottle of MiraLax / Glycolax (288g) - no prescription required  -A large bottle of Gatorade / Powerade (64oz)  -Dulcolax tablets (4 tabs) - no prescription required   DAY PRIOR TO SURGERY   7:00am Swallow 4 Dulcolax tablets with some water Drink plenty of clear liquids all day to avoid getting dehydrated (Water, juice, soda, coffee, tea, bouillon, jello, etc.)  10:00am Mix the bottle of MiraLax with the 64-oz bottle of Gatorade.  Drink the Gatorade mixture gradually over the next few hours (8oz glass every 15-30 minutes) until gone. You should finish by 2pm.  2:00pm Take 2 Neomycin 500mg  tablets & 2 Metronidazole 500mg  tablets  3:00pm Take 2 Neomycin 500mg  tablets & 2 Metronidazole 500mg  tablets  Drink plenty of clear liquids all evening to avoid getting dehydrated  10:00pm Take 2 Neomycin 500mg  tablets & 2 Metronidazole 500mg  tablets  Do not eat or drink anything after bedtime (midnight) the night before your surgery.   MORNING OF SURGERY Remember to not to drink or eat anything that morning  Hold or take medications as recommended by the hospital staff at your Preoperative visit  If you have questions or concerns, please call [facility] [phone] during business hours to speak to the clinical staff for advice.                     CLEAR LIQUID DIET   Foods Allowed  Foods Excluded  Coffee and tea,  regular and decaf                             liquids that you cannot  Plain Jell-O in any flavor                                             see through such as: Fruit ices (not with fruit pulp)                                     milk, soups, orange juice  Iced Popsicles                                                           All solid food Carbonated beverages, regular and diet                                    Cranberry, grape and apple juices Sports drinks like Gatorade Lightly seasoned clear broth or consume(fat free) Sugar, honey syrup  _____________________________________________________________________

## 2015-10-13 NOTE — Consult Note (Signed)
WOC requested for preoperative stoma site marking  Discussed surgical procedure and stoma creation with patient.  Explained role of the [facility] nurse team.  Patient was not familiar with the creation of a stoma or need for this but I have explained we are only marking him in case its needed.  He agrees to proceed with marking.  Examined patient sitting, and standing in order to place the marking in the patient's visual field, away from any creases or abdominal contour issues and within the rectus muscle.  Attempted to mark below the patient's belt line.    Marked for colostomy in the LLQ  7_ cm to the left of the umbilicus and 0000000 below the umbilicus.   Cleaned the area with CHG wipe, allowed to air dry. Marked site and covered mark with thin film transparent dressing to preserve mark until date of surgery  Janalyn Higby Liane Comber RN,CWOCN [id number]

## 2015-10-14 NOTE — Anesthesia Preprocedure Evaluation (Addendum)
Anesthesia Evaluation  Patient identified by MRN, date of birth, ID band Patient awake    Reviewed: Allergy & Precautions, NPO status , Patient's Chart, lab work & pertinent test results, reviewed documented beta blocker date and time   Airway Mallampati: II  TM Distance: >3 FB Neck ROM: Full    Dental  (+) Teeth Intact, Dental Advisory Given   Pulmonary neg pulmonary ROS, sleep apnea ,    Pulmonary exam normal breath sounds clear to auscultation       Cardiovascular hypertension, Pt. on medications and Pt. on home beta blockers (-) angina+ CAD  (-) Past MI Normal cardiovascular exam Rhythm:Regular Rate:Normal  LHC 04/06/15: 1.   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  Distal 40-50% LAD narrowing. Proximal LAD calcification.  No significant coronary obstruction  Left ventricular enlargement with global hypokinesis and an estimated ejection fraction of 45%. Etiology could include obesity, hypertension, and sleep apnea. Has been cleared by cardiologist as low risk for planned surgery  03/22/15 TTE: Study Conclusions  - Left ventricle: The cavity size was moderately dilated. There wasmild concentric hypertrophy. Systolic function was mildly tomoderately reduced. The estimated ejection fraction was in therange of 40% to 45%. There is akinesis of the anteroseptal andinferoseptal myocardium. - Aorta: Aortic root dimension: 48 mm (ED). - Ascending aorta: The ascending aorta was moderately dilated. - Left atrium: The atrium was moderately dilated. - Right ventricle: The cavity size was moderately dilated. Wallthickness was normal. - Right atrium: The atrium was mildly dilated.    Neuro/Psych negative neurological ROS  negative psych ROS   GI/Hepatic Neg liver ROS, GERD  Medicated,(+)     substance abuse  alcohol use, Decreasing ETOH use Rectal bleeding,colon CA    Endo/Other  diabetes, Type 2, Oral Hypoglycemic AgentsMorbid  obesityBMI 50  Renal/GU negative Renal ROS     Musculoskeletal negative musculoskeletal ROS (+)   Abdominal (+) + obese,   Peds  Hematology negative hematology ROS (+)   Anesthesia Other Findings Day of surgery medications reviewed with the patient.  Reproductive/Obstetrics                           Anesthesia Physical Anesthesia Plan  ASA: III  Anesthesia Plan: General   Post-op Pain Management:    Induction: Intravenous  Airway Management Planned: Oral ETT and Video Laryngoscope Planned  Additional Equipment: Arterial line  Intra-op Plan:   Post-operative Plan:   Informed Consent: I have reviewed the patients History and Physical, chart, labs and discussed the procedure including the risks, benefits and alternatives for the proposed anesthesia with the patient or authorized representative who has indicated his/her understanding and acceptance.     Plan Discussed with:   Anesthesia Plan Comments: (Multimodal pain RX, second IV if both arms to be tucked)       Anesthesia Quick Evaluation

## 2015-10-16 NOTE — Progress Notes (Signed)
This encounter was created in error - please disregard.

## 2015-10-18 ENCOUNTER — Inpatient Hospital Stay (HOSPITAL_COMMUNITY): Payer: BLUE CROSS/BLUE SHIELD | Admitting: Anesthesiology

## 2015-10-18 ENCOUNTER — Encounter (HOSPITAL_COMMUNITY): Payer: Self-pay | Admitting: *Deleted

## 2015-10-18 ENCOUNTER — Encounter (HOSPITAL_COMMUNITY)
Admission: RE | Disposition: A | Discharge status: Home / Self Care | Payer: Self-pay | Source: Ambulatory Visit | Attending: Surgery

## 2015-10-18 ENCOUNTER — Inpatient Hospital Stay (HOSPITAL_COMMUNITY)
Admission: RE | Admit: 2015-10-18 | Discharge: 2015-10-23 | DRG: 330 | Disposition: A | Discharge status: Home / Self Care | Payer: BLUE CROSS/BLUE SHIELD | Source: Ambulatory Visit | Attending: General Surgery | Admitting: General Surgery

## 2015-10-18 DIAGNOSIS — F329 Major depressive disorder, single episode, unspecified: Secondary | ICD-10-CM | POA: Diagnosis present

## 2015-10-18 DIAGNOSIS — C186 Malignant neoplasm of descending colon: Secondary | ICD-10-CM | POA: Diagnosis present

## 2015-10-18 DIAGNOSIS — Z7984 Long term (current) use of oral hypoglycemic drugs: Secondary | ICD-10-CM | POA: Diagnosis not present

## 2015-10-18 DIAGNOSIS — E1165 Type 2 diabetes mellitus with hyperglycemia: Secondary | ICD-10-CM | POA: Diagnosis present

## 2015-10-18 DIAGNOSIS — E1140 Type 2 diabetes mellitus with diabetic neuropathy, unspecified: Secondary | ICD-10-CM | POA: Diagnosis present

## 2015-10-18 DIAGNOSIS — K8020 Calculus of gallbladder without cholecystitis without obstruction: Secondary | ICD-10-CM | POA: Diagnosis present

## 2015-10-18 DIAGNOSIS — I110 Hypertensive heart disease with heart failure: Secondary | ICD-10-CM | POA: Diagnosis present

## 2015-10-18 DIAGNOSIS — Z800 Family history of malignant neoplasm of digestive organs: Secondary | ICD-10-CM

## 2015-10-18 DIAGNOSIS — Z01812 Encounter for preprocedural laboratory examination: Secondary | ICD-10-CM

## 2015-10-18 DIAGNOSIS — K219 Gastro-esophageal reflux disease without esophagitis: Secondary | ICD-10-CM | POA: Diagnosis present

## 2015-10-18 DIAGNOSIS — I5022 Chronic systolic (congestive) heart failure: Secondary | ICD-10-CM | POA: Diagnosis present

## 2015-10-18 DIAGNOSIS — N359 Urethral stricture, unspecified: Secondary | ICD-10-CM | POA: Diagnosis present

## 2015-10-18 DIAGNOSIS — Z6841 Body Mass Index (BMI) 40.0 and over, adult: Secondary | ICD-10-CM | POA: Diagnosis not present

## 2015-10-18 DIAGNOSIS — Z7982 Long term (current) use of aspirin: Secondary | ICD-10-CM | POA: Diagnosis not present

## 2015-10-18 DIAGNOSIS — C19 Malignant neoplasm of rectosigmoid junction: Secondary | ICD-10-CM | POA: Diagnosis present

## 2015-10-18 DIAGNOSIS — Q5564 Hidden penis: Secondary | ICD-10-CM | POA: Diagnosis not present

## 2015-10-18 DIAGNOSIS — I2510 Atherosclerotic heart disease of native coronary artery without angina pectoris: Secondary | ICD-10-CM | POA: Diagnosis present

## 2015-10-18 DIAGNOSIS — K760 Fatty (change of) liver, not elsewhere classified: Secondary | ICD-10-CM | POA: Diagnosis present

## 2015-10-18 DIAGNOSIS — Z8249 Family history of ischemic heart disease and other diseases of the circulatory system: Secondary | ICD-10-CM | POA: Diagnosis not present

## 2015-10-18 DIAGNOSIS — Z720 Tobacco use: Secondary | ICD-10-CM | POA: Diagnosis not present

## 2015-10-18 DIAGNOSIS — Z79899 Other long term (current) drug therapy: Secondary | ICD-10-CM | POA: Diagnosis not present

## 2015-10-18 DIAGNOSIS — I10 Essential (primary) hypertension: Secondary | ICD-10-CM | POA: Diagnosis present

## 2015-10-18 DIAGNOSIS — Z9049 Acquired absence of other specified parts of digestive tract: Secondary | ICD-10-CM

## 2015-10-18 DIAGNOSIS — Z833 Family history of diabetes mellitus: Secondary | ICD-10-CM | POA: Diagnosis not present

## 2015-10-18 DIAGNOSIS — D127 Benign neoplasm of rectosigmoid junction: Secondary | ICD-10-CM | POA: Diagnosis present

## 2015-10-18 DIAGNOSIS — I252 Old myocardial infarction: Secondary | ICD-10-CM | POA: Diagnosis not present

## 2015-10-18 DIAGNOSIS — N35913 Unspecified membranous urethral stricture, male: Secondary | ICD-10-CM

## 2015-10-18 HISTORY — PX: CYSTOSCOPY: SHX5120

## 2015-10-18 LAB — CREATININE, SERUM: CREATININE: 0.93 mg/dL (ref 0.61–1.24)

## 2015-10-18 LAB — GLUCOSE, CAPILLARY
GLUCOSE-CAPILLARY: 199 mg/dL — AB (ref 65–99)
GLUCOSE-CAPILLARY: 234 mg/dL — AB (ref 65–99)
GLUCOSE-CAPILLARY: 218 mg/dL — AB (ref 65–99)
Glucose-Capillary: 186 mg/dL — ABNORMAL HIGH (ref 65–99)
Glucose-Capillary: 209 mg/dL — ABNORMAL HIGH (ref 65–99)
Glucose-Capillary: 205 mg/dL — ABNORMAL HIGH (ref 65–99)

## 2015-10-18 LAB — CBC
HEMATOCRIT: 41.6 % (ref 39.0–52.0)
Hemoglobin: 14.6 g/dL (ref 13.0–17.0)
MCH: 32.2 pg (ref 26.0–34.0)
MCHC: 35.1 g/dL (ref 30.0–36.0)
MCV: 91.6 fL (ref 78.0–100.0)
PLATELETS: 270 10*3/uL (ref 150–400)
RBC: 4.54 MIL/uL (ref 4.22–5.81)
RDW: 12.6 % (ref 11.5–15.5)
WBC: 15.8 10*3/uL — AB (ref 4.0–10.5)

## 2015-10-18 LAB — TYPE AND SCREEN
ABO/RH(D): A POS
ANTIBODY SCREEN: NEGATIVE

## 2015-10-18 SURGERY — COLECTOMY, PARTIAL, ROBOT-ASSISTED, LAPAROSCOPIC
Anesthesia: General | Site: Penis

## 2015-10-18 MED ORDER — DIPHENHYDRAMINE HCL 50 MG/ML IJ SOLN: 12.5000 mg | Freq: Four times a day (QID) | INTRAMUSCULAR | Status: DC | PRN

## 2015-10-18 MED ORDER — ALVIMOPAN 12 MG PO CAPS
12.0000 mg | ORAL_CAPSULE | Freq: Two times a day (BID) | ORAL | Status: DC
  Administered 2015-10-19 – 2015-10-21 (×4): 12 mg via ORAL
  Filled 2015-10-18 (×8): qty 1

## 2015-10-18 MED ORDER — DEXTROSE 5 % IV SOLN
2.0000 g | Freq: Two times a day (BID) | INTRAVENOUS | Status: AC
  Administered 2015-10-18: 2 g via INTRAVENOUS
  Filled 2015-10-18: qty 2

## 2015-10-18 MED ORDER — MIDAZOLAM HCL 5 MG/5ML IJ SOLN
INTRAMUSCULAR | Status: DC | PRN
  Administered 2015-10-18: 2 mg via INTRAVENOUS

## 2015-10-18 MED ORDER — ONDANSETRON 4 MG PO TBDP: 4.0000 mg | ORAL_TABLET | Freq: Four times a day (QID) | ORAL | Status: DC | PRN

## 2015-10-18 MED ORDER — BUPIVACAINE LIPOSOME 1.3 % IJ SUSP
20.0000 mL | Freq: Once | INTRAMUSCULAR | Status: AC
  Administered 2015-10-18: 20 mL
  Filled 2015-10-18: qty 20

## 2015-10-18 MED ORDER — DEXTROSE 5 % IV SOLN
2.0000 g | INTRAVENOUS | Status: AC
  Administered 2015-10-18: 2 g via INTRAVENOUS
  Filled 2015-10-18: qty 2

## 2015-10-18 MED ORDER — FENTANYL CITRATE (PF) 100 MCG/2ML IJ SOLN
25.0000 ug | INTRAMUSCULAR | Status: DC | PRN
  Administered 2015-10-18 (×2): 50 ug via INTRAVENOUS

## 2015-10-18 MED ORDER — CHLORHEXIDINE GLUCONATE 4 % EX LIQD: 60.0000 mL | Freq: Once | CUTANEOUS | Status: DC

## 2015-10-18 MED ORDER — ALVIMOPAN 12 MG PO CAPS
12.0000 mg | ORAL_CAPSULE | Freq: Once | ORAL | Status: AC
  Administered 2015-10-18: 12 mg via ORAL
  Filled 2015-10-18: qty 1

## 2015-10-18 MED ORDER — DEXTROSE 5 % IV SOLN
1000.0000 mg | Freq: Four times a day (QID) | INTRAVENOUS | Status: DC | PRN
  Administered 2015-10-18: 1000 mg via INTRAVENOUS
  Filled 2015-10-18 (×2): qty 10

## 2015-10-18 MED ORDER — LACTATED RINGERS IV SOLN: INTRAVENOUS | Status: DC

## 2015-10-18 MED ORDER — BUPIVACAINE-EPINEPHRINE 0.25% -1:200000 IJ SOLN
INTRAMUSCULAR | Status: DC | PRN
  Administered 2015-10-18: 50 mL

## 2015-10-18 MED ORDER — CARVEDILOL 6.25 MG PO TABS
6.2500 mg | ORAL_TABLET | Freq: Every morning | ORAL | Status: DC
  Administered 2015-10-19: 6.25 mg via ORAL
  Filled 2015-10-18: qty 1

## 2015-10-18 MED ORDER — FENTANYL CITRATE (PF) 250 MCG/5ML IJ SOLN
INTRAMUSCULAR | Status: AC
  Filled 2015-10-18: qty 5

## 2015-10-18 MED ORDER — PROCHLORPERAZINE EDISYLATE 5 MG/ML IJ SOLN: 10.0000 mg | Freq: Four times a day (QID) | INTRAMUSCULAR | Status: DC | PRN

## 2015-10-18 MED ORDER — ESMOLOL HCL 100 MG/10ML IV SOLN
INTRAVENOUS | Status: DC | PRN
  Administered 2015-10-18: 5 mg via INTRAVENOUS
  Administered 2015-10-18: 15 mg via INTRAVENOUS
  Administered 2015-10-18: 10 mg via INTRAVENOUS

## 2015-10-18 MED ORDER — HYDROMORPHONE HCL 2 MG/ML IJ SOLN
INTRAMUSCULAR | Status: AC
  Filled 2015-10-18: qty 1

## 2015-10-18 MED ORDER — SODIUM CHLORIDE 0.9 % IJ SOLN
INTRAMUSCULAR | Status: DC | PRN
  Administered 2015-10-18: 20 mL

## 2015-10-18 MED ORDER — LISINOPRIL-HYDROCHLOROTHIAZIDE 20-25 MG PO TABS: 1.0000 | ORAL_TABLET | Freq: Every day | ORAL | Status: DC

## 2015-10-18 MED ORDER — ASPIRIN EC 81 MG PO TBEC
81.0000 mg | DELAYED_RELEASE_TABLET | Freq: Every day | ORAL | Status: DC
  Administered 2015-10-19 – 2015-10-23 (×5): 81 mg via ORAL
  Filled 2015-10-18 (×7): qty 1

## 2015-10-18 MED ORDER — ONDANSETRON HCL 4 MG/2ML IJ SOLN: 4.0000 mg | Freq: Once | INTRAMUSCULAR | Status: DC

## 2015-10-18 MED ORDER — 0.9 % SODIUM CHLORIDE (POUR BTL) OPTIME
TOPICAL | Status: DC | PRN
  Administered 2015-10-18: 1000 mL

## 2015-10-18 MED ORDER — ONDANSETRON HCL 4 MG/2ML IJ SOLN
INTRAMUSCULAR | Status: AC
  Filled 2015-10-18: qty 2

## 2015-10-18 MED ORDER — ROCURONIUM BROMIDE 100 MG/10ML IV SOLN
INTRAVENOUS | Status: DC | PRN
  Administered 2015-10-18: 10 mg via INTRAVENOUS
  Administered 2015-10-18: 40 mg via INTRAVENOUS
  Administered 2015-10-18: 5 mg via INTRAVENOUS
  Administered 2015-10-18: 20 mg via INTRAVENOUS
  Administered 2015-10-18: 35 mg via INTRAVENOUS
  Administered 2015-10-18: 40 mg via INTRAVENOUS
  Administered 2015-10-18: 20 mg via INTRAVENOUS

## 2015-10-18 MED ORDER — BUPIVACAINE-EPINEPHRINE 0.25% -1:200000 IJ SOLN
INTRAMUSCULAR | Status: AC
  Filled 2015-10-18: qty 1

## 2015-10-18 MED ORDER — SODIUM CHLORIDE 0.9 % IJ SOLN
INTRAMUSCULAR | Status: AC
  Filled 2015-10-18: qty 10

## 2015-10-18 MED ORDER — HYDROMORPHONE HCL 1 MG/ML IJ SOLN
0.5000 mg | INTRAMUSCULAR | Status: DC | PRN
  Administered 2015-10-18: 2 mg via INTRAVENOUS
  Administered 2015-10-19 (×3): 1 mg via INTRAVENOUS
  Filled 2015-10-18: qty 2
  Filled 2015-10-18: qty 1
  Filled 2015-10-18: qty 2
  Filled 2015-10-18: qty 1

## 2015-10-18 MED ORDER — PROPOFOL 10 MG/ML IV BOLUS
INTRAVENOUS | Status: AC
  Filled 2015-10-18: qty 20

## 2015-10-18 MED ORDER — ALUM & MAG HYDROXIDE-SIMETH 200-200-20 MG/5ML PO SUSP: 30.0000 mL | Freq: Four times a day (QID) | ORAL | Status: DC | PRN

## 2015-10-18 MED ORDER — ACETAMINOPHEN 500 MG PO TABS
1000.0000 mg | ORAL_TABLET | Freq: Three times a day (TID) | ORAL | Status: DC
  Administered 2015-10-18 – 2015-10-22 (×12): 1000 mg via ORAL
  Filled 2015-10-18 (×17): qty 2

## 2015-10-18 MED ORDER — MEPERIDINE HCL 50 MG/ML IJ SOLN: 6.2500 mg | INTRAMUSCULAR | Status: DC | PRN

## 2015-10-18 MED ORDER — SODIUM CHLORIDE 0.9 % IV SOLN
INTRAVENOUS | Status: AC
  Administered 2015-10-18: 500 mL via INTRAPERITONEAL
  Filled 2015-10-18: qty 6

## 2015-10-18 MED ORDER — LIDOCAINE HCL (CARDIAC) 20 MG/ML IV SOLN
INTRAVENOUS | Status: AC
  Filled 2015-10-18: qty 5

## 2015-10-18 MED ORDER — LACTATED RINGERS IV SOLN
INTRAVENOUS | Status: DC | PRN
  Administered 2015-10-18 (×2): via INTRAVENOUS

## 2015-10-18 MED ORDER — LACTATED RINGERS IR SOLN
Status: DC | PRN
  Administered 2015-10-18: 1000 mL

## 2015-10-18 MED ORDER — LIP MEDEX EX OINT
1.0000 "application " | TOPICAL_OINTMENT | Freq: Two times a day (BID) | CUTANEOUS | Status: DC
  Administered 2015-10-19 – 2015-10-23 (×8): 1 via TOPICAL
  Filled 2015-10-18 (×3): qty 7

## 2015-10-18 MED ORDER — SUCCINYLCHOLINE CHLORIDE 20 MG/ML IJ SOLN
INTRAMUSCULAR | Status: DC | PRN
  Administered 2015-10-18 (×2): 100 mg via INTRAVENOUS

## 2015-10-18 MED ORDER — EPHEDRINE SULFATE 50 MG/ML IJ SOLN
INTRAMUSCULAR | Status: AC
  Filled 2015-10-18: qty 1

## 2015-10-18 MED ORDER — ALBUTEROL SULFATE HFA 108 (90 BASE) MCG/ACT IN AERS
INHALATION_SPRAY | RESPIRATORY_TRACT | Status: AC
  Filled 2015-10-18: qty 6.7

## 2015-10-18 MED ORDER — SUCRALFATE 1 G PO TABS
1.0000 g | ORAL_TABLET | Freq: Three times a day (TID) | ORAL | Status: DC
  Administered 2015-10-18: 1 g via ORAL
  Filled 2015-10-18 (×8): qty 1

## 2015-10-18 MED ORDER — INSULIN ASPART 100 UNIT/ML ~~LOC~~ SOLN
0.0000 [IU] | Freq: Every day | SUBCUTANEOUS | Status: DC
  Administered 2015-10-18: 2 [IU] via SUBCUTANEOUS

## 2015-10-18 MED ORDER — MIDAZOLAM HCL 2 MG/2ML IJ SOLN
INTRAMUSCULAR | Status: AC
  Filled 2015-10-18: qty 2

## 2015-10-18 MED ORDER — MENTHOL 3 MG MT LOZG: 1.0000 | LOZENGE | OROMUCOSAL | Status: DC | PRN

## 2015-10-18 MED ORDER — GLIPIZIDE ER 5 MG PO TB24
5.0000 mg | ORAL_TABLET | Freq: Every day | ORAL | Status: DC
  Administered 2015-10-19 – 2015-10-23 (×5): 5 mg via ORAL
  Filled 2015-10-18 (×7): qty 1

## 2015-10-18 MED ORDER — INSULIN ASPART 100 UNIT/ML ~~LOC~~ SOLN
3.0000 [IU] | Freq: Once | SUBCUTANEOUS | Status: AC
  Administered 2015-10-18: 3 [IU] via SUBCUTANEOUS

## 2015-10-18 MED ORDER — DEXAMETHASONE SODIUM PHOSPHATE 10 MG/ML IJ SOLN
INTRAMUSCULAR | Status: AC
  Filled 2015-10-18: qty 1

## 2015-10-18 MED ORDER — LACTATED RINGERS IV SOLN
INTRAVENOUS | Status: DC | PRN
  Administered 2015-10-18 (×2): via INTRAVENOUS

## 2015-10-18 MED ORDER — INSULIN ASPART 100 UNIT/ML ~~LOC~~ SOLN
0.0000 [IU] | Freq: Three times a day (TID) | SUBCUTANEOUS | Status: DC
  Administered 2015-10-18: 3 [IU] via SUBCUTANEOUS
  Administered 2015-10-19 (×2): 5 [IU] via SUBCUTANEOUS
  Administered 2015-10-19 – 2015-10-20 (×4): 3 [IU] via SUBCUTANEOUS
  Administered 2015-10-21: 2 [IU] via SUBCUTANEOUS
  Administered 2015-10-21 (×2): 3 [IU] via SUBCUTANEOUS
  Administered 2015-10-22: 2 [IU] via SUBCUTANEOUS
  Administered 2015-10-22 – 2015-10-23 (×3): 3 [IU] via SUBCUTANEOUS

## 2015-10-18 MED ORDER — ENOXAPARIN SODIUM 40 MG/0.4ML ~~LOC~~ SOLN
40.0000 mg | Freq: Once | SUBCUTANEOUS | Status: AC
  Administered 2015-10-18: 40 mg via SUBCUTANEOUS
  Filled 2015-10-18: qty 0.4

## 2015-10-18 MED ORDER — NEOMYCIN SULFATE 500 MG PO TABS: 1000.0000 mg | ORAL_TABLET | Freq: Three times a day (TID) | ORAL | Status: DC

## 2015-10-18 MED ORDER — PROPOFOL 10 MG/ML IV BOLUS
INTRAVENOUS | Status: DC | PRN
  Administered 2015-10-18: 50 mg via INTRAVENOUS
  Administered 2015-10-18: 200 mg via INTRAVENOUS

## 2015-10-18 MED ORDER — ALUM & MAG HYDROXIDE-SIMETH 200-200-20 MG/5ML PO SUSP
30.0000 mL | Freq: Four times a day (QID) | ORAL | Status: DC | PRN
Start: 2015-10-18 — End: 2015-10-18

## 2015-10-18 MED ORDER — OXYCODONE HCL 5 MG PO TABS: 5.0000 mg | ORAL_TABLET | ORAL | Status: DC | PRN

## 2015-10-18 MED ORDER — ONDANSETRON HCL 4 MG/2ML IJ SOLN
INTRAMUSCULAR | Status: DC | PRN
  Administered 2015-10-18: 4 mg via INTRAVENOUS

## 2015-10-18 MED ORDER — MAGIC MOUTHWASH
15.0000 mL | Freq: Four times a day (QID) | ORAL | Status: DC | PRN
  Filled 2015-10-18: qty 15

## 2015-10-18 MED ORDER — LORAZEPAM 2 MG/ML IJ SOLN
0.5000 mg | Freq: Three times a day (TID) | INTRAMUSCULAR | Status: DC | PRN
Start: 2015-10-18 — End: 2015-10-23

## 2015-10-18 MED ORDER — HYDROCHLOROTHIAZIDE 25 MG PO TABS
25.0000 mg | ORAL_TABLET | Freq: Every day | ORAL | Status: DC
  Administered 2015-10-18 – 2015-10-19 (×2): 25 mg via ORAL
  Filled 2015-10-18 (×2): qty 1

## 2015-10-18 MED ORDER — KETOROLAC TROMETHAMINE 30 MG/ML IJ SOLN
INTRAMUSCULAR | Status: AC
  Filled 2015-10-18: qty 1

## 2015-10-18 MED ORDER — CEFOTETAN DISODIUM-DEXTROSE 2-2.08 GM-% IV SOLR
INTRAVENOUS | Status: AC
Start: 2015-10-18 — End: 2015-10-18
  Filled 2015-10-18: qty 50

## 2015-10-18 MED ORDER — FENTANYL CITRATE (PF) 100 MCG/2ML IJ SOLN
INTRAMUSCULAR | Status: DC | PRN
  Administered 2015-10-18 (×4): 50 ug via INTRAVENOUS
  Administered 2015-10-18 (×2): 100 ug via INTRAVENOUS
  Administered 2015-10-18 (×4): 50 ug via INTRAVENOUS
  Administered 2015-10-18: 150 ug via INTRAVENOUS

## 2015-10-18 MED ORDER — INSULIN ASPART 100 UNIT/ML ~~LOC~~ SOLN
5.0000 [IU] | Freq: Once | SUBCUTANEOUS | Status: AC
  Administered 2015-10-18: 5 [IU] via SUBCUTANEOUS

## 2015-10-18 MED ORDER — PHENYLEPHRINE HCL 10 MG/ML IJ SOLN
INTRAMUSCULAR | Status: DC | PRN
  Administered 2015-10-18 (×5): 80 ug via INTRAVENOUS

## 2015-10-18 MED ORDER — SUGAMMADEX SODIUM 500 MG/5ML IV SOLN
INTRAVENOUS | Status: AC
  Filled 2015-10-18: qty 5

## 2015-10-18 MED ORDER — ESMOLOL HCL 100 MG/10ML IV SOLN
INTRAVENOUS | Status: AC
  Filled 2015-10-18: qty 10

## 2015-10-18 MED ORDER — SPOT INK MARKER SYRINGE KIT
PACK | SUBMUCOSAL | Status: AC
Start: 2015-10-18 — End: 2015-10-18
  Filled 2015-10-18: qty 5

## 2015-10-18 MED ORDER — ALBUTEROL SULFATE HFA 108 (90 BASE) MCG/ACT IN AERS
INHALATION_SPRAY | RESPIRATORY_TRACT | Status: DC | PRN
  Administered 2015-10-18 (×2): 2 via RESPIRATORY_TRACT

## 2015-10-18 MED ORDER — ROCURONIUM BROMIDE 100 MG/10ML IV SOLN
INTRAVENOUS | Status: AC
  Filled 2015-10-18: qty 1

## 2015-10-18 MED ORDER — PANTOPRAZOLE SODIUM 40 MG PO TBEC
80.0000 mg | DELAYED_RELEASE_TABLET | Freq: Every day | ORAL | Status: DC
  Administered 2015-10-19 – 2015-10-23 (×5): 80 mg via ORAL
  Filled 2015-10-18 (×7): qty 2

## 2015-10-18 MED ORDER — INSULIN ASPART 100 UNIT/ML ~~LOC~~ SOLN
SUBCUTANEOUS | Status: AC
  Filled 2015-10-18: qty 1

## 2015-10-18 MED ORDER — KETOROLAC TROMETHAMINE 30 MG/ML IJ SOLN
INTRAMUSCULAR | Status: DC | PRN
  Administered 2015-10-18: 30 mg via INTRAVENOUS

## 2015-10-18 MED ORDER — VITAMIN C 500 MG PO TABS
1000.0000 mg | ORAL_TABLET | Freq: Every day | ORAL | Status: DC
  Administered 2015-10-19: 1000 mg via ORAL
  Filled 2015-10-18 (×2): qty 2

## 2015-10-18 MED ORDER — LIDOCAINE HCL (CARDIAC) 20 MG/ML IV SOLN
INTRAVENOUS | Status: DC | PRN
  Administered 2015-10-18: 100 mg via INTRAVENOUS

## 2015-10-18 MED ORDER — LISINOPRIL 20 MG PO TABS
20.0000 mg | ORAL_TABLET | Freq: Every day | ORAL | Status: DC
  Administered 2015-10-18 – 2015-10-19 (×2): 20 mg via ORAL
  Filled 2015-10-18 (×3): qty 1

## 2015-10-18 MED ORDER — SPOT INK MARKER SYRINGE KIT
PACK | SUBMUCOSAL | Status: DC | PRN
  Administered 2015-10-18: 5 mL via SUBMUCOSAL

## 2015-10-18 MED ORDER — FENTANYL CITRATE (PF) 100 MCG/2ML IJ SOLN
INTRAMUSCULAR | Status: AC
  Filled 2015-10-18: qty 2

## 2015-10-18 MED ORDER — SUGAMMADEX SODIUM 200 MG/2ML IV SOLN
INTRAVENOUS | Status: DC | PRN
  Administered 2015-10-18: 400 mg via INTRAVENOUS

## 2015-10-18 MED ORDER — ENOXAPARIN SODIUM 40 MG/0.4ML ~~LOC~~ SOLN
40.0000 mg | SUBCUTANEOUS | Status: DC
  Administered 2015-10-19 – 2015-10-23 (×5): 40 mg via SUBCUTANEOUS
  Filled 2015-10-18 (×6): qty 0.4

## 2015-10-18 MED ORDER — SACCHAROMYCES BOULARDII 250 MG PO CAPS
250.0000 mg | ORAL_CAPSULE | Freq: Two times a day (BID) | ORAL | Status: DC
  Administered 2015-10-18 – 2015-10-23 (×10): 250 mg via ORAL
  Filled 2015-10-18 (×13): qty 1

## 2015-10-18 MED ORDER — LACTATED RINGERS IV SOLN
INTRAVENOUS | Status: DC
  Administered 2015-10-18: 18:00:00 via INTRAVENOUS

## 2015-10-18 MED ORDER — DIPHENHYDRAMINE HCL 12.5 MG/5ML PO ELIX: 12.5000 mg | ORAL_SOLUTION | Freq: Four times a day (QID) | ORAL | Status: DC | PRN

## 2015-10-18 MED ORDER — METRONIDAZOLE 500 MG PO TABS: 1000.0000 mg | ORAL_TABLET | Freq: Three times a day (TID) | ORAL | Status: DC

## 2015-10-18 MED ORDER — METOPROLOL TARTRATE 1 MG/ML IV SOLN: 5.0000 mg | Freq: Four times a day (QID) | INTRAVENOUS | Status: DC | PRN

## 2015-10-18 MED ORDER — PHENOL 1.4 % MT LIQD: 2.0000 | OROMUCOSAL | Status: DC | PRN

## 2015-10-18 SURGICAL SUPPLY — 113 items
APPLIER CLIP 5 13 M/L LIGAMAX5 (MISCELLANEOUS)
APPLIER CLIP ROT 10 11.4 M/L (STAPLE)
BALLN NEPHROSTOMY (BALLOONS) ×4
BALLOON NEPHROSTOMY (BALLOONS) ×2 IMPLANT
BLADE EXTENDED COATED 6.5IN (ELECTRODE) IMPLANT
BLADE SURG SZ11 CARB STEEL (BLADE) ×4 IMPLANT
CABLE HIGH FREQUENCY MONO STRZ (ELECTRODE) IMPLANT
CANNULA REDUC XI 12-8 STAPL (CANNULA) ×1
CANNULA REDUC XI 12-8MM STAPL (CANNULA) ×1
CANNULA REDUCER 12-8 DVNC XI (CANNULA) ×2 IMPLANT
CATH COUDE 24FR 5CC (CATHETERS) ×2 IMPLANT
CATH FOLEY 2W COUNCIL 20FR 5CC (CATHETERS) ×4 IMPLANT
CATH FOLEY SILVER 30CC 28FR (CATHETERS) ×4 IMPLANT
CATH HEMA 3WAY 30CC 24FR COUDE (CATHETERS) ×4 IMPLANT
CATH RIBBED COUDE  30CC (CATHETERS) ×2
CELLS DAT CNTRL 66122 CELL SVR (MISCELLANEOUS) IMPLANT
CHLORAPREP W/TINT 26ML (MISCELLANEOUS) ×4 IMPLANT
CLIP APPLIE 5 13 M/L LIGAMAX5 (MISCELLANEOUS) IMPLANT
CLIP APPLIE ROT 10 11.4 M/L (STAPLE) IMPLANT
CLIP LIGATING HEM O LOK PURPLE (MISCELLANEOUS) IMPLANT
CLIP LIGATING HEMO O LOK GREEN (MISCELLANEOUS) IMPLANT
COUNTER NEEDLE 20 DBL MAG RED (NEEDLE) ×4 IMPLANT
COVER MAYO STAND STRL (DRAPES) ×8 IMPLANT
COVER SURGICAL LIGHT HANDLE (MISCELLANEOUS) ×4 IMPLANT
COVER TIP SHEARS 8 DVNC (MISCELLANEOUS) ×2 IMPLANT
COVER TIP SHEARS 8MM DA VINCI (MISCELLANEOUS) ×2
DECANTER SPIKE VIAL GLASS SM (MISCELLANEOUS) ×8 IMPLANT
DEVICE TROCAR PUNCTURE CLOSURE (ENDOMECHANICALS) IMPLANT
DRAIN CHANNEL 19F RND (DRAIN) IMPLANT
DRAPE ARM DVNC X/XI (DISPOSABLE) ×8 IMPLANT
DRAPE COLUMN DVNC XI (DISPOSABLE) ×2 IMPLANT
DRAPE DA VINCI XI ARM (DISPOSABLE) ×8
DRAPE DA VINCI XI COLUMN (DISPOSABLE) ×2
DRAPE SURG IRRIG POUCH 19X23 (DRAPES) ×4 IMPLANT
DRAPE WARM FLUID 44X44 (DRAPE) IMPLANT
DRSG OPSITE POSTOP 4X10 (GAUZE/BANDAGES/DRESSINGS) IMPLANT
DRSG OPSITE POSTOP 4X6 (GAUZE/BANDAGES/DRESSINGS) ×4 IMPLANT
DRSG OPSITE POSTOP 4X8 (GAUZE/BANDAGES/DRESSINGS) IMPLANT
DRSG TEGADERM 2-3/8X2-3/4 SM (GAUZE/BANDAGES/DRESSINGS) ×4 IMPLANT
DRSG TEGADERM 4X4.75 (GAUZE/BANDAGES/DRESSINGS) IMPLANT
ELECT PENCIL ROCKER SW 15FT (MISCELLANEOUS) ×8 IMPLANT
ELECT REM PT RETURN 9FT ADLT (ELECTROSURGICAL) ×4
ELECTRODE REM PT RTRN 9FT ADLT (ELECTROSURGICAL) ×2 IMPLANT
ENDOLOOP SUT PDS II  0 18 (SUTURE)
ENDOLOOP SUT PDS II 0 18 (SUTURE) IMPLANT
EVACUATOR SILICONE 100CC (DRAIN) IMPLANT
GAUZE SPONGE 2X2 8PLY STRL LF (GAUZE/BANDAGES/DRESSINGS) ×2 IMPLANT
GAUZE SPONGE 4X4 12PLY STRL (GAUZE/BANDAGES/DRESSINGS) IMPLANT
GLOVE ECLIPSE 8.0 STRL XLNG CF (GLOVE) ×12 IMPLANT
GLOVE INDICATOR 8.0 STRL GRN (GLOVE) ×12 IMPLANT
GOWN STRL REUS W/TWL XL LVL3 (GOWN DISPOSABLE) ×16 IMPLANT
GUIDEWIRE STR DUAL SENSOR (WIRE) ×4 IMPLANT
KIT PROCEDURE DA VINCI SI (MISCELLANEOUS) ×4
KIT PROCEDURE DVNC SI (MISCELLANEOUS) ×4 IMPLANT
LEGGING LITHOTOMY PAIR STRL (DRAPES) ×4 IMPLANT
LUBRICANT JELLY K Y 4OZ (MISCELLANEOUS) IMPLANT
MARKER SKIN DUAL TIP RULER LAB (MISCELLANEOUS) ×4 IMPLANT
NEEDLE INSUFFLATION 14GA 120MM (NEEDLE) ×4 IMPLANT
PACK CARDIOVASCULAR III (CUSTOM PROCEDURE TRAY) ×4 IMPLANT
PACK COLON (CUSTOM PROCEDURE TRAY) ×4 IMPLANT
PORT LAP GEL ALEXIS MED 5-9CM (MISCELLANEOUS) IMPLANT
RTRCTR WOUND ALEXIS 18CM MED (MISCELLANEOUS)
SCISSORS LAP 5X35 DISP (ENDOMECHANICALS) IMPLANT
SCRUB PCMX 4 OZ (MISCELLANEOUS) ×4 IMPLANT
SEAL CANN UNIV 5-8 DVNC XI (MISCELLANEOUS) ×6 IMPLANT
SEAL XI 5MM-8MM UNIVERSAL (MISCELLANEOUS) ×6
SEALER VESSEL DA VINCI XI (MISCELLANEOUS)
SEALER VESSEL EXT DVNC XI (MISCELLANEOUS) IMPLANT
SET BI-LUMEN FLTR TB AIRSEAL (TUBING) ×4 IMPLANT
SET TUBE IRRIG SUCTION NO TIP (IRRIGATION / IRRIGATOR) ×4 IMPLANT
SOLUTION ELECTROLUBE (MISCELLANEOUS) ×4 IMPLANT
SPONGE GAUZE 2X2 STER 10/PKG (GAUZE/BANDAGES/DRESSINGS) ×2
STAPLER 45 BLU RELOAD XI (STAPLE) IMPLANT
STAPLER 45 BLUE RELOAD XI (STAPLE)
STAPLER 45 GREEN RELOAD XI (STAPLE) ×4
STAPLER 45 GRN RELOAD XI (STAPLE) ×4 IMPLANT
STAPLER CANNULA SEAL DVNC XI (STAPLE) ×2 IMPLANT
STAPLER CANNULA SEAL XI (STAPLE) ×2
STAPLER SHEATH (SHEATH) ×2
STAPLER SHEATH ENDOWRIST DVNC (SHEATH) ×2 IMPLANT
SUT MNCRL AB 4-0 PS2 18 (SUTURE) ×4 IMPLANT
SUT PDS AB 1 CTX 36 (SUTURE) ×8 IMPLANT
SUT PDS AB 1 TP1 96 (SUTURE) IMPLANT
SUT PDS AB 2-0 CT2 27 (SUTURE) IMPLANT
SUT PROLENE 0 CT 2 (SUTURE) ×4 IMPLANT
SUT PROLENE 2 0 SH DA (SUTURE) IMPLANT
SUT SILK 2 0 (SUTURE) ×2
SUT SILK 2 0 SH CR/8 (SUTURE) ×4 IMPLANT
SUT SILK 2-0 18XBRD TIE 12 (SUTURE) ×2 IMPLANT
SUT SILK 3 0 (SUTURE) ×2
SUT SILK 3 0 SH CR/8 (SUTURE) ×4 IMPLANT
SUT SILK 3-0 18XBRD TIE 12 (SUTURE) ×2 IMPLANT
SUT V-LOC BARB 180 2/0GR6 GS22 (SUTURE)
SUT VIC AB 3-0 SH 18 (SUTURE) IMPLANT
SUT VIC AB 3-0 SH 27 (SUTURE)
SUT VIC AB 3-0 SH 27XBRD (SUTURE) IMPLANT
SUT VICRYL 0 UR6 27IN ABS (SUTURE) ×4 IMPLANT
SUT VLOC 180 2-0 9IN GS21 (SUTURE) IMPLANT
SUTURE V-LC BRB 180 2/0GR6GS22 (SUTURE) IMPLANT
SYRINGE 10CC LL (SYRINGE) ×4 IMPLANT
SYRINGE 60CC LL (MISCELLANEOUS) ×4 IMPLANT
SYS LAPSCP GELPORT 120MM (MISCELLANEOUS)
SYSTEM LAPSCP GELPORT 120MM (MISCELLANEOUS) IMPLANT
TAPE UMBILICAL COTTON 1/8X30 (MISCELLANEOUS) ×4 IMPLANT
TOWEL OR 17X26 10 PK STRL BLUE (TOWEL DISPOSABLE) ×4 IMPLANT
TOWEL OR NON WOVEN STRL DISP B (DISPOSABLE) IMPLANT
TRAY FOLEY W/METER SILVER 14FR (SET/KITS/TRAYS/PACK) ×4 IMPLANT
TRAY FOLEY W/METER SILVER 16FR (SET/KITS/TRAYS/PACK) ×4 IMPLANT
TROCAR ADV FIXATION 5X100MM (TROCAR) IMPLANT
TROCAR Z-THREAD FIOS 5X100MM (TROCAR) IMPLANT
TUBING CONNECTING 10 (TUBING) IMPLANT
TUBING CONNECTING 10' (TUBING)
TUNNELER SHEATH ON-Q 16GX12 DP (PAIN MANAGEMENT) IMPLANT

## 2015-10-18 NOTE — Anesthesia Postprocedure Evaluation (Signed)
Anesthesia Post Note  Patient: Chad [name]  Procedure(s) Performed: Procedure(s) (LRB): XI ROBOT ASSISTED LOW ANTERIOR RESECTION WITH  RIGID PROCTOSCOPY WITH TATTOOING  (N/A) CYSTOSCOPY FLEXIBLE URETHRAL DIALATION AND FOLEY PLACEMENT  Patient location during evaluation: PACU Anesthesia Type: General Level of consciousness: awake and alert Pain management: pain level controlled Vital Signs Assessment: post-procedure vital signs reviewed and stable Respiratory status: spontaneous breathing, nonlabored ventilation, respiratory function stable and patient connected to nasal cannula oxygen Cardiovascular status: blood pressure returned to baseline and stable Postop Assessment: no signs of nausea or vomiting Anesthetic complications: no Comments: Blood sugar still elevated, following sliding scale insulin guidelines    Last Vitals:  Filed Vitals:   10/18/15 1444 10/18/15 1450  BP:  148/94  Pulse: 102   Temp:    Resp: 19     Last Pain:  Filed Vitals:   10/18/15 1456  PainSc: 4                  Alexis Frock

## 2015-10-18 NOTE — Discharge Instructions (Signed)
SURGERY: POST OP INSTRUCTIONS °(Surgery for small bowel obstruction, colon resection, etc)  ° ° °DIET °Follow a light diet the first few days at home.  Start with a bland diet such as soups, liquids, starchy foods, low fat foods, etc.  If you feel full, bloated, or constipated, stay on a ful liquid or pureed/blenderized diet for a few days until you feel better and no longer constipated. °Be sure to drink plenty of fluids every day to avoid getting dehydrated (feeling dizzy, not urinating, etc.). °Gradually add a fiber supplement to your diet over the next week.  Gradually get back to a regular solid diet.  Avoid fast food or heavy meals the first week as you are more likely to get nauseated. °It is expected for your digestive tract to need a few months to get back to normal.  It is common for your bowel movements and stools to be irregular.  You will have occasional bloating and cramping that should eventually fade away.  Until you are eating solid food normally, off all pain medications, and back to regular activities; your bowels will not be normal. °Focus on eating a low-fat, high fiber diet the rest of your life (See Getting to Good Bowel Health, below). ° °CARE of your INCISION or WOUND °It is good for closed incision and even open wounds to be washed every day.  Shower every day.  Short baths are fine.  Wash the incisions and wounds clean with soap & water.    °If you have a closed incision(s), wash the incision with soap & water every day.  You may leave closed incisions open to air if it is dry.   You may cover the incision with clean gauze & replace it after your daily shower for comfort. °If you have skin tapes (Steristrips) or skin glue (Dermabond) on your incision, leave them in place.  They will fall off on their own like a scab.  You may trim any edges that curl up with clean scissors.  If you have staples, set up an appointment for them to be removed in the office in 10 days after surgery.  °If you  have a drain, wash around the skin exit site with soap & water and place a new dressing of gauze or band aid around the skin every day.  Keep the drain site clean & dry.    °If you have an open wound with packing, see wound care instructions.  In general, it is encouraged that you remove your dressing and packing, shower with soap & water, and replace your dressing once a day.  Pack the wound with clean gauze moistened with normal (0.9%) saline to keep the wound moist & uninfected.  Pressure on the dressing for 30 minutes will stop most wound bleeding.  Eventually your body will heal & pull the open wound closed over the next few months.  °Raw open wounds will occasionally bleed or secrete yellow drainage until it heals closed.  Drain sites will drain a little until the drain is removed.  Even closed incisions can have mild bleeding or drainage the first few days until the skin edges scab over & seal.   °If you have an open wound with a wound vac, see wound vac care instructions. ° ° ° ° °ACTIVITIES as tolerated °Start light daily activities --- self-care, walking, climbing stairs-- beginning the day after surgery.  Gradually increase activities as tolerated.  Control your pain to be active.  Stop when you   are tired.  Ideally, walk several times a day, eventually an hour a day.   Most people are back to most day-to-day activities in a few weeks.  It takes 4-8 weeks to get back to unrestricted, intense activity. If you can walk 30 minutes without difficulty, it is safe to try more intense activity such as jogging, treadmill, bicycling, low-impact aerobics, swimming, etc. Save the most intensive and strenuous activity for last (Usually 4-8 weeks after surgery) such as sit-ups, heavy lifting, contact sports, etc.  Refrain from any intense heavy lifting or straining until you are off narcotics for pain control.  You will have off days, but things should improve week-by-week. DO NOT PUSH THROUGH PAIN.  Let pain be  your guide: If it hurts to do something, don't do it.  Pain is your body warning you to avoid that activity for another week until the pain goes down. You may drive when you are no longer taking narcotic prescription pain medication, you can comfortably wear a seatbelt, and you can safely make sudden turns/stops to protect yourself without hesitating due to pain. You may have sexual intercourse when it is comfortable. If it hurts to do something, stop.  MEDICATIONS Take your usually prescribed home medications unless otherwise directed.   Blood thinners:  Usually you can restart any strong blood thinners after the second postoperative day.  It is OK to take aspirin right away.     If you are on strong blood thinners (warfarin/Coumadin, Plavix, Xerelto, Eliquis, Pradaxa, etc), discuss with your surgeon, medicine PCP, and/or cardiologist for instructions on when to restart the blood thinner & if blood monitoring is needed (PT/INR blood check, etc).     PAIN CONTROL Pain after surgery or related to activity is often due to strain/injury to muscle, tendon, nerves and/or incisions.  This pain is usually short-term and will improve in a few months.  To help speed the process of healing and to get back to regular activity more quickly, DO THE FOLLOWING THINGS TOGETHER: 1. Increase activity gradually.  DO NOT PUSH THROUGH PAIN 2. Use Ice and/or Heat 3. Try Gentle Massage and/or Stretching 4. Take over the counter pain medication 5. Take Narcotic prescription pain medication for more severe pain  Good pain control = faster recovery.  It is better to take more medicine to be more active than to stay in bed all day to avoid medications. 1.  Increase activity gradually Avoid heavy lifting at first, then increase to lifting as tolerated over the next 6 weeks. Do not push through the pain.  Listen to your body and avoid positions and maneuvers than reproduce the pain.  Wait a few days before trying  something more intense Walking an hour a day is encouraged to help your body recover faster and more safely.  Start slowly and stop when getting sore.  If you can walk 30 minutes without stopping or pain, you can try more intense activity (running, jogging, aerobics, cycling, swimming, treadmill, sex, sports, weightlifting, etc.) Remember: If it hurts to do it, then dont do it! 2. Use Ice and/or Heat You will have swelling and bruising around the incisions.  This will take several weeks to resolve. Ice packs or heating pads (6-8 times a day, 30-60 minutes at a time) will help sooth soreness & bruising. Some people prefer to use ice alone, heat alone, or alternate between ice & heat.  Experiment and see what works best for you.  Consider trying ice for the first  few days to help decrease swelling and bruising; then, switch to heat to help relax sore spots and speed recovery. Shower every day.  Short baths are fine.  It feels good!  Keep the incisions and wounds clean with soap & water.   3. Try Gentle Massage and/or Stretching Massage at the area of pain many times a day Stop if you feel pain - do not overdo it 4. Take over the counter pain medication This helps the muscle and nerve tissues become less irritable and calm down faster Choose ONE of the following over-the-counter anti-inflammatory medications: Acetaminophen '500mg'$  tabs (Tylenol) 1-2 pills with every meal and just before bedtime (avoid if you have liver problems or if you have acetaminophen in you narcotic prescription) Naproxen '220mg'$  tabs (ex. Aleve, Naprosyn) 1-2 pills twice a day (avoid if you have kidney, stomach, IBD, or bleeding problems) Ibuprofen '200mg'$  tabs (ex. Advil, Motrin) 3-4 pills with every meal and just before bedtime (avoid if you have kidney, stomach, IBD, or bleeding problems) Take with food/snack several times a day as directed for at least 2 weeks to help keep pain / soreness down & more manageable. 5. Take Narcotic  prescription pain medication for more severe pain A prescription for strong pain control is often given to you upon discharge (for example: oxycodone/Percocet, hydrocodone/Norco/Vicodin, or tramadol/Ultram) Take your pain medication as prescribed. Be mindful that most narcotic prescriptions contain Tylenol (acetaminophen) as well - avoid taking too much Tylenol. If you are having problems/concerns with the prescription medicine (does not control pain, nausea, vomiting, rash, itching, etc.), please call us [phone] to see if we need to switch you to a different pain medicine that will work better for you and/or control your side effects better. If you need a refill on your pain medication, you must call the office before 4 pm and on weekdays only.  By federal law, prescriptions for narcotics cannot be called into a pharmacy.  They must be filled out on paper & picked up from our office by the patient or authorized caretaker.  Prescriptions cannot be filled after 4 pm nor on weekends.   WHEN TO CALL us [phone] Severe uncontrolled or worsening pain  Fever over 101 F (38.5 C) Concerns with the incision: Worsening pain, redness, rash/hives, swelling, bleeding, or drainage Reactions / problems with new medications (itching, rash, hives, nausea, etc.) Nausea and/or vomiting Difficulty urinating Difficulty breathing Worsening fatigue, dizziness, lightheadedness, blurred vision Other concerns If you are not getting better after two weeks or are noticing you are getting worse, contact our office [phone] for further advice.  We may need to adjust your medications, re-evaluate you in the office, send you to the emergency room, or see what other things we can do to help. The clinic staff is available to answer your questions during regular business hours (8:30am-5pm).  Please dont hesitate to call and ask to speak to one of our nurses for clinical concerns.    A surgeon from [facility] is always on call at the hospitals 24 hours/day If you have a medical emergency, go to the nearest emergency room or call 911. FOLLOW UP in our office One the day of your discharge from the hospital (or the next business weekday), please call [facility] to set up or confirm an appointment to see your surgeon in the office for a follow-up appointment.  Usually it is 2-3 weeks after your surgery.   If you have skin staples at  your incision(s), let the office know so we can set up a time in the office for the nurse to remove them (usually around 10 days after surgery). Make sure that you call for appointments the day of discharge (or the next business weekday) from the hospital to ensure a convenient appointment time. IF YOU HAVE DISABILITY OR FAMILY LEAVE FORMS, BRING THEM TO THE OFFICE FOR PROCESSING.  DO NOT GIVE THEM TO YOUR DOCTOR.  [facility] [address] [place], [place], [place]  [address] ? [phone] - Main [phone] - [place],  [phone] - Fax [email or web address]  GETTING TO GOOD BOWEL HEALTH. It is expected for your digestive tract to need a few months to get back to normal.  It is common for your bowel movements and stools to be irregular.  You will have occasional bloating and cramping that should eventually fade away.  Until you are eating solid food normally, off all pain medications, and back to regular activities; your bowels will not be normal.   Avoiding constipation The goal: ONE SOFT BOWEL MOVEMENT A DAY!    Drink plenty of fluids.  Choose water first. TAKE A FIBER SUPPLEMENT EVERY DAY THE REST OF YOUR LIFE During your first week back home, gradually add back a fiber supplement every day Experiment which form you can tolerate.   There are many forms such as powders, tablets, wafers, gummies, etc Psyllium bran (Metamucil), methylcellulose (Citrucel), Miralax or Glycolax, Benefiber, Flax Seed.    Adjust the dose week-by-week (1/2 dose/day to 6 doses a day) until you are moving your bowels 1-2 times a day.  Cut back the dose or try a different fiber product if it is giving you problems such as diarrhea or bloating. Sometimes a laxative is needed to help jump-start bowels if constipated until the fiber supplement can help regulate your bowels.  If you are tolerating eating & you are farting, it is okay to try a gentle laxative such as double dose MiraLax, prune juice, or Milk of Magnesia.  Avoid using laxatives too often. Stool softeners can sometimes help counteract the constipating effects of narcotic pain medicines.  It can also cause diarrhea, so avoid using for too long. If you are still constipated despite taking fiber daily, eating solids, and a few doses of laxatives, call our office. Controlling diarrhea Try drinking liquids and eating bland foods for a few days to avoid stressing your intestines further. Avoid dairy products (especially milk & ice cream) for a short time.  The intestines often can lose the ability to digest lactose when stressed. Avoid foods that cause gassiness or bloating.  Typical foods include beans and other legumes, cabbage, broccoli, and dairy foods.  Avoid greasy, spicy, fast foods.  Every person has some sensitivity to other foods, so listen to your body and avoid those foods that trigger problems for you. Probiotics (such as active yogurt, Align, etc) may help repopulate the intestines and colon with normal bacteria and calm down a sensitive digestive tract Adding a fiber supplement gradually can help thicken stools by absorbing excess fluid and retrain the intestines to act more normally.  Slowly increase the dose over a few weeks.  Too much fiber too soon can backfire and cause cramping & bloating. It is okay to try and slow down diarrhea with a few doses of antidiarrheal medicines.   Bismuth subsalicylate (ex. Kayopectate, Pepto Bismol) for a few doses can  help control diarrhea.  Avoid if pregnant.  Loperamide (Imodium) can slow down diarrhea.  Start with one tablet ('2mg'$ ) first.  Avoid if you are having fevers or severe pain.  ILEOSTOMY PATIENTS WILL HAVE CHRONIC DIARRHEA since their colon is not in use.    Drink plenty of liquids.  You will need to drink even more glasses of water/liquid a day to avoid getting dehydrated. Record output from your ileostomy.  Expect to empty the bag every 3-4 hours at first.  Most people with a permanent ileostomy empty their bag 4-6 times at the least.   Use antidiarrheal medicine (especially Imodium) several times a day to avoid getting dehydrated.  Start with a dose at bedtime & breakfast.  Adjust up or down as needed.  Increase antidiarrheal medications as directed to avoid emptying the bag more than 8 times a day (every 3 hours). Work with your wound ostomy nurse to learn care for your ostomy.  See ostomy care instructions. TROUBLESHOOTING IRREGULAR BOWELS 1) Start with a soft & bland diet. No spicy, greasy, or fried foods.  2) Avoid gluten/wheat or dairy products from diet to see if symptoms improve. 3) Miralax 17gm or flax seed mixed in [place]. water or juice-daily. May use 2-4 times a day as needed. 4) Gas-X, Phazyme, etc. as needed for gas & bloating.  5) Prilosec (omeprazole) over-the-counter as needed 6)  Consider probiotics (Align, Activa, etc) to help calm the bowels down  Call your doctor if you are getting worse or not getting better.  Sometimes further testing (cultures, endoscopy, X-ray studies, CT scans, bloodwork, etc.) may be needed to help diagnose and treat the cause of the diarrhea. [facility], [place], [place], [place], [place]  [address] [phone] - Main.    [phone]  - Toll Free.   [phone] - Fax [email or web address]  GETTING TO GOOD BOWEL HEALTH. Irregular bowel habits such as constipation and diarrhea can lead to many problems over  time.  Having one soft bowel movement a day is the most important way to prevent further problems.  The anorectal canal is designed to handle stretching and feces to safely manage our ability to get rid of solid waste (feces, poop, stool) out of our body.  BUT, hard constipated stools can act like ripping concrete bricks and diarrhea can be a burning fire to this very sensitive area of our body, causing inflamed hemorrhoids, anal fissures, increasing risk is perirectal abscesses, abdominal pain/bloating, an making irritable bowel worse.      The goal: ONE SOFT BOWEL MOVEMENT A DAY!  To have soft, regular bowel movements:   Drink plenty of fluids, consider 4-6 tall glasses of water a day.    Take plenty of fiber.  Fiber is the undigested part of plant food that passes into the colon, acting s natures broom to encourage bowel motility and movement.  Fiber can absorb and hold large amounts of water. This results in a larger, bulkier stool, which is soft and easier to pass. Work gradually over several weeks up to 6 servings a day of fiber (25g a day even more if needed) in the form of: o Vegetables -- Root (potatoes, carrots, turnips), leafy green (lettuce, salad greens, celery, spinach), or cooked high residue (cabbage, broccoli, etc) o Fruit -- Fresh (unpeeled skin & pulp), Dried (prunes, apricots, cherries, etc ),  or stewed ( applesauce)  o Whole grain breads, pasta, etc (whole wheat)  o Bran cereals   Bulking Agents -- This type of water-retaining fiber generally  is easily obtained each day by one of the following:  o Psyllium bran -- The psyllium plant is remarkable because its ground seeds can retain so much water. This product is available as Metamucil, Konsyl, Effersyllium, Per Diem Fiber, or the less expensive generic preparation in drug and health food stores. Although labeled a laxative, it really is not a laxative.  o Methylcellulose -- This is another fiber derived from wood which also  retains water. It is available as Citrucel. o Polyethylene Glycol - and artificial fiber commonly called Miralax or Glycolax.  It is helpful for people with gassy or bloated feelings with regular fiber o Flax Seed - a less gassy fiber than psyllium  No reading or other relaxing activity while on the toilet. If bowel movements take longer than 5 minutes, you are too constipated  AVOID CONSTIPATION.  High fiber and water intake usually takes care of this.  Sometimes a laxative is needed to stimulate more frequent bowel movements, but   Laxatives are not a good long-term solution as it can wear the colon out.  They can help jump-start bowels if constipated, but should be relied on constantly without discussing with your doctor o Osmotics (Milk of Magnesia, Fleets phosphosoda, Magnesium citrate, MiraLax, GoLytely) are safer than  o Stimulants (Senokot, Castor Oil, Dulcolax, Ex Lax)    o Avoid taking laxatives for more than 7 days in a row.   IF SEVERELY CONSTIPATED, try a Bowel Retraining Program: o Do not use laxatives.  o Eat a diet high in roughage, such as bran cereals and leafy vegetables.  o Drink six (6) ounces of prune or apricot juice each morning.  o Eat two (2) large servings of stewed fruit each day.  o Take one (1) heaping tablespoon of a psyllium-based bulking agent twice a day. Use sugar-free sweetener when possible to avoid excessive calories.  o Eat a normal breakfast.  o Set aside 15 minutes after breakfast to sit on the toilet, but do not strain to have a bowel movement.  o If you do not have a bowel movement by the third day, use an enema and repeat the above steps.   Controlling diarrhea o Switch to liquids and simpler foods for a few days to avoid stressing your intestines further. o Avoid dairy products (especially milk & ice cream) for a short time.  The intestines often can lose the ability to digest lactose when stressed. o Avoid foods that cause gassiness or  bloating.  Typical foods include beans and other legumes, cabbage, broccoli, and dairy foods.  Every person has some sensitivity to other foods, so listen to our body and avoid those foods that trigger problems for you. o Adding fiber (Citrucel, Metamucil, psyllium, Miralax) gradually can help thicken stools by absorbing excess fluid and retrain the intestines to act more normally.  Slowly increase the dose over a few weeks.  Too much fiber too soon can backfire and cause cramping & bloating. o Probiotics (such as active yogurt, Align, etc) may help repopulate the intestines and colon with normal bacteria and calm down a sensitive digestive tract.  Most studies show it to be of mild help, though, and such products can be costly. o Medicines: - Bismuth subsalicylate (ex. Kayopectate, Pepto Bismol) every 30 minutes for up to 6 doses can help control diarrhea.  Avoid if pregnant. - Loperamide (Immodium) can slow down diarrhea.  Start with two tablets ('4mg'$  total) first and then try one tablet every 6 hours.  Avoid if you are having fevers or severe pain.  If you are not better or start feeling worse, stop all medicines and call your doctor for advice o Call your doctor if you are getting worse or not better.  Sometimes further testing (cultures, endoscopy, X-ray studies, bloodwork, etc) may be needed to help diagnose and treat the cause of the diarrhea.  TROUBLESHOOTING IRREGULAR BOWELS 1) Avoid extremes of bowel movements (no bad constipation/diarrhea) 2) Miralax 17gm mixed in 8oz. water or juice-daily. May use BID as needed.  3) Gas-x,Phazyme, etc. as needed for gas & bloating.  4) Soft,bland diet. No spicy,greasy,fried foods.  5) Prilosec over-the-counter as needed  6) May hold gluten/wheat products from diet to see if symptoms improve.  7)  May try probiotics (Align, Activa, etc) to help calm the bowels down 7) If symptoms become worse call back immediately.  Managing Pain  Pain after surgery or  related to activity is often due to strain/injury to muscle, tendon, nerves and/or incisions.  This pain is usually short-term and will improve in a few months.   Many people find it helpful to do the following things TOGETHER to help speed the process of healing and to get back to regular activity more quickly:  1. Avoid heavy physical activity at first a. No lifting greater than 20 pounds at first, then increase to lifting as tolerated over the next few weeks b. Do not push through the pain.  Listen to your body and avoid positions and maneuvers than reproduce the pain.  Wait a few days before trying something more intense c. Walking is okay as tolerated, but go slowly and stop when getting sore.  If you can walk 30 minutes without stopping or pain, you can try more intense activity (running, jogging, aerobics, cycling, swimming, treadmill, sex, sports, weightlifting, etc ) d. Remember: If it hurts to do it, then dont do it!  2. Take Anti-inflammatory medication a. Choose ONE of the following over-the-counter medications: i.            Acetaminophen 500mg  tabs (Tylenol) 1-2 pills with every meal and just before bedtime (avoid if you have liver problems) ii.            Naproxen 220mg  tabs (ex. Aleve) 1-2 pills twice a day (avoid if you have kidney, stomach, IBD, or bleeding problems) iii. Ibuprofen 200mg  tabs (ex. Advil, Motrin) 3-4 pills with every meal and just before bedtime (avoid if you have kidney, stomach, IBD, or bleeding problems) b. Take with food/snack around the clock for 1-2 weeks i. This helps the muscle and nerve tissues become less irritable and calm down faster  3. Use a Heating pad or Ice/Cold Pack a. 4-6 times a day b. May use warm bath/hottub  or showers  4. Try Gentle Massage and/or Stretching  a. at the area of pain many times a day b. stop if you feel pain - do not overdo it  Try these steps together to help you body heal faster and avoid making things get worse.   Doing just one of these things may not be enough.    If you are not getting better after two weeks or are noticing you are getting worse, contact our office for further advice; we may need to re-evaluate you & see what other things we can do to help.  Colorectal Cancer Colorectal cancer is an abnormal growth of tissue (tumor) in the colon or rectum that is cancerous (malignant). Unlike noncancerous (benign) tumors,  malignant tumors can spread to other parts of your body. The colon is the large bowel or large intestine. The rectum is the last several inches of the colon.  RISK FACTORS The exact cause of colorectal cancer is unknown. However, the following factors may increase your chances of getting colorectal cancer:   Age older than 87 years.   Abnormal growths (polyps) on the inner wall of the colon or rectum.   Diabetes.   African American race.   Family history of hereditary nonpolyposis colorectal cancer. This condition is caused by changes in the genes that are responsible for repairing mismatched DNA.   Personal history of cancer. A person who has already had colorectal cancer may develop it a second time. Also, women with a history of ovarian, uterine, or breast cancer are at a somewhat higher risk of developing colorectal cancer.  Certain hereditary conditions.  Eating a diet that is high in fat (especially animal fat) and low in fiber, fruits, and vegetables.  Sedentary lifestyle.  Inflammatory bowel disease, including ulcerative colitis and Crohn's disease.   Smoking.   Excessive alcohol use.  SYMPTOMS Early colorectal cancer often does not cause symptoms. As the cancer grows, symptoms may include:   Changes in bowel habits.  Diarrhea.   Constipation.   Feeling like the bowel does not empty completely after a bowel movement.   Blood in the stool.   Stools that are narrower than usual.   Abdominal discomfort, pain, bloating, fullness, or  cramps.  Frequent gas pain.   Unexplained weight loss.   Constant tiredness.   Nausea and vomiting.  DIAGNOSIS  Your health care provider will ask about your medical history. He or she may also perform a number of procedures, such as:   A physical exam.  A digital rectal exam.  A fecal occult blood test.  A barium enema.  Blood tests.   X-rays.   Imaging tests, such as CT scans or MRIs.   Taking a tissue sample (biopsy) from your colon or rectum to look for cancer cells.   A sigmoidoscopy to view the inside of the last part of your colon.   A colonoscopy to view the inside of your entire colon.   An endorectal ultrasound to see how deep a rectal tumor has grown and whether the cancer has spread to lymph nodes or other nearby tissues.  Your cancer will be staged to determine its severity and extent. Staging is a careful attempt to find out the size of the tumor, whether the cancer has spread, and if so, to what parts of the body. You may need to have more tests to determine the stage of your cancer. The test results will help determine what treatment plan is best for you.   Stage 0. The cancer is found only in the innermost lining of the colon or rectum.   Stage I. The cancer has grown into the inner wall of the colon or rectum. The cancer has not yet reached the outer wall of the colon.   Stage II. The cancer extends more deeply into or through the wall of the colon or rectum. It may have invaded nearby tissue, but cancer cells have not spread to the lymph nodes.   Stage III. The cancer has spread to nearby lymph nodes but not to other parts of the body.   Stage IV. The cancer has spread to other parts of the body, such as the liver or lungs.  Your health care provider  may tell you the detailed stage of your cancer, which includes both a number and a letter.  TREATMENT  Depending on the type and stage, colorectal cancer may be treated with surgery,  radiation therapy, chemotherapy, targeted therapy, or radiofrequency ablation. Some people have a combination of these therapies. Surgery may be done to remove the polyps from your colon. In early stages, your health care provider may be able to do this during a colonoscopy. In later stages, surgery may be done to remove part of your colon.  HOME CARE INSTRUCTIONS   Take medicines only as directed by your health care provider.   Maintain a healthy diet.   Consider joining a support group. This may help you learn to cope with the stress of having colorectal cancer.   Seek advice to help you manage treatment of side effects.   Keep all follow-up visits as directed by your health care provider.   Inform your cancer specialist if you are admitted to the hospital.  SEEK MEDICAL CARE IF:  Your diarrhea or constipation does not go away.   Your bowel habits change.  You have increased abdominal pain.   You notice new fatigue or weakness.  You lose weight.   This information is not intended to replace advice given to you by your health care provider. Make sure you discuss any questions you have with your health care provider.   Document Released: 07/01/2005 Document Revised: 07/22/2014 Document Reviewed: 12/24/2012 Elsevier Interactive Patient Education [employer].

## 2015-10-18 NOTE — H&P (Signed)
Chad [name] 07/31/2015 2:00 PM Location: [facility] Patient #: [id number] DOB: 03-19-1971 Single / Language: Chad [name] / Race: White Male  Patient Care Team: Chad Knapp, MD as PCP - General (Family Medicine)   History of Present Illness    The patient is a 45 year old male who presents with colorectal cancer. Note for "Colorectal cancer": Patient sent for surgical consultation by Dr. Silvano [name]. Patient declined Dr. Leighton [name] and wished a different surgeon. We fit him in my clinic today.  Pleasant morbidly obese male. Today with his brother. He has a history of diabetes controlled with oral hypoglycemics. 6.9 HgbA1C 3 weeks ago. He has had intermittent chest & abdominal pains in the past year. Had CT of chest abdomen showed mildly dilated aortic root. Underwent echocardiogram with dilated aortic root. Underwent cardiac catheterization with mild coronary disease but no significant lesions.  Head CT scan of abdomen and pelvis a few months ago that was underwhelming. Had an ultrasound that confirmed gallstones but no cholecystitis. Otherwise underwhelming. Has had some intermittent rectal bleeding. Was sent for gastroenterology consultation. Had upper endoscopy that was underwhelming. Patient does note he was started on proton pump inhibitor with some improvements in his symptoms. Had colonoscopy done. A few adenomatous polyps removed in proximal colon and rectum. More bulky mass noted at 22 cm from the anal verge. Biopsy consistent with adenocarcinoma. Surgical consultation requested.  His father recently passed away from pancreatic cancer at age 28. No other family history of breast, colon, ovarian cancer. No personal nor family history of GI/colon cancer, inflammatory bowel disease, irritable bowel syndrome, allergy such as Celiac Sprue, dietary/dairy problems, colitis, ulcers nor gastritis. No recent sick contacts/gastroenteritis. No travel outside the  country. No changes in diet. No dysphagia to solids or liquids. No significant heartburn or reflux. No hematochezia, hematemesis, coffee ground emesis. No evidence of prior gastric/peptic ulceration.   Other Problems Chad [name], CMA; 07/31/2015 2:00 PM) Cholelithiasis Colon Cancer Depression Diabetes Mellitus Gastroesophageal Reflux Disease High blood pressure Vascular Disease  Past Surgical History Chad [name], CMA; 07/31/2015 2:00 PM) No pertinent past surgical history  Diagnostic Studies History Chad [name], CMA; 07/31/2015 2:00 PM) Colonoscopy within last year  Allergies Chad [name], CMA; 07/31/2015 2:00 PM) No Known Drug Allergies 07/31/2015  Medication History Chad [name], CMA; 07/31/2015 2:01 PM) Carvedilol (6.25MG  Tablet, Oral) Active. Dicyclomine HCl (10MG  Capsule, Oral) Active. GlipiZIDE ER (5MG  Tablet ER 24HR, Oral) Active. Lisinopril-Hydrochlorothiazide (20-25MG  Tablet, Oral) Active. LORazepam (1MG  Tablet, Oral) Active. Omeprazole (40MG  Capsule DR, Oral) Active. Ascorbic Acid (1000MG  Tablet, Oral) Active. Aspirin (81MG  Tablet, Oral) Active. Medications Reconciled  Social History Chad [name], [place]; 07/31/2015 2:00 PM) Alcohol use Heavy alcohol use. Caffeine use Carbonated beverages, Tea. No drug use Tobacco use Never smoker.  Family History Chad [name], [place]; 07/31/2015 2:00 PM) Diabetes Mellitus Father, Mother. Heart Disease Father. Hypertension Father. Malignant Neoplasm Of Pancreas Father.     Review of Systems Chad [name] CMA; 07/31/2015 2:00 PM) General Not Present- Appetite Loss, Chills, Fatigue, Fever, Night Sweats, Weight Gain and Weight Loss. Skin Not Present- Change in Wart/Mole, Dryness, Hives, Jaundice, New Lesions, Non-Healing Wounds, Rash and Ulcer. HEENT Not Present- Earache, Hearing Loss, Hoarseness, Nose Bleed, Oral Ulcers, Ringing in the Ears, Seasonal Allergies, Sinus Pain, Sore Throat, Visual Disturbances,  Wears glasses/contact lenses and Yellow Eyes. Respiratory Not Present- Bloody sputum, Chronic Cough, Difficulty Breathing, Snoring and Wheezing. Cardiovascular Present- Chest Pain. Not Present- Difficulty Breathing Lying Down, Leg Cramps, Palpitations, Rapid Heart Rate, Shortness of  Breath and Swelling of Extremities. Gastrointestinal Present- Bloody Stool and Chronic diarrhea. Not Present- Abdominal Pain, Bloating, Change in Bowel Habits, Constipation, Difficulty Swallowing, Excessive gas, Gets full quickly at meals, Hemorrhoids, Indigestion, Nausea, Rectal Pain and Vomiting. Male Genitourinary Not Present- Blood in Urine, Change in Urinary Stream, Frequency, Impotence, Nocturia, Painful Urination, Urgency and Urine Leakage. Musculoskeletal Present- Back Pain and Joint Pain. Not Present- Joint Stiffness, Muscle Pain, Muscle Weakness and Swelling of Extremities. Neurological Present- Headaches. Not Present- Decreased Memory, Fainting, Numbness, Seizures, Tingling, Tremor, Trouble walking and Weakness. Psychiatric Present- Anxiety, Change in Sleep Pattern, Depression and Frequent crying. Not Present- Bipolar and Fearful. Hematology Not Present- Easy Bruising, Excessive bleeding, Gland problems, HIV and Persistent Infections.  Vitals Chad [name] CMA; 07/31/2015 2:01 PM) 07/31/2015 2:01 PM Weight: 403 lb Height: 75in Body Surface Area: 2.96 m Body Mass Index: 50.37 kg/m  Temp.: 98.78F(Temporal)  Pulse: 90 (Regular)  BP: 142/86 (Sitting, Left Arm, Standard)      Physical Exam Chad Hector MD; 07/31/2015 2:37 PM)  General Mental Status-Alert. General Appearance-Not in acute distress, Not Sickly. Orientation-Oriented X3. Hydration-Well hydrated. Voice-Normal.  Integumentary Global Assessment Upon inspection and palpation of skin surfaces of the - Axillae: non-tender, no inflammation or ulceration, no drainage. and Distribution of scalp and body hair is  normal. General Characteristics Temperature - normal warmth is noted.  Head and Neck Head-normocephalic, atraumatic with no lesions or palpable masses. Face Global Assessment - atraumatic, no absence of expression. Neck Global Assessment - no abnormal movements, no bruit auscultated on the right, no bruit auscultated on the left, no decreased range of motion, non-tender. Trachea-midline. Thyroid Gland Characteristics - non-tender.  Eye Eyeball - Left-Extraocular movements intact, No Nystagmus. Eyeball - Right-Extraocular movements intact, No Nystagmus. Cornea - Left-No Hazy. Cornea - Right-No Hazy. Sclera/Conjunctiva - Left-No scleral icterus, No Discharge. Sclera/Conjunctiva - Right-No scleral icterus, No Discharge. Pupil - Left-Direct reaction to light normal. Pupil - Right-Direct reaction to light normal.  ENMT Ears Pinna - Left - no drainage observed, no generalized tenderness observed. Right - no drainage observed, no generalized tenderness observed. Nose and Sinuses External Inspection of the Nose - no destructive lesion observed. Inspection of the nares - Left - quiet respiration. Right - quiet respiration. Mouth and Throat Lips - Upper Lip - no fissures observed, no pallor noted. Lower Lip - no fissures observed, no pallor noted. Nasopharynx - no discharge present. Oral Cavity/Oropharynx - Tongue - no dryness observed. Oral Mucosa - no cyanosis observed. Hypopharynx - no evidence of airway distress observed.  Chest and Lung Exam Inspection Movements - Normal and Symmetrical. Accessory muscles - No use of accessory muscles in breathing. Palpation Palpation of the chest reveals - Non-tender. Auscultation Breath sounds - Normal and Clear.  Cardiovascular Auscultation Rhythm - Regular. Murmurs & Other Heart Sounds - Auscultation of the heart reveals - No Murmurs and No Systolic Clicks.  Abdomen Inspection Inspection of the abdomen reveals - No  Visible peristalsis and No Abnormal pulsations. Umbilicus - No Bleeding, No Urine drainage. Palpation/Percussion Palpation and Percussion of the abdomen reveal - Soft, Non Tender, No Rebound tenderness, No Rigidity (guarding) and No Cutaneous hyperesthesia. Note: Morbid obesity. Mild diastases recti. Sensitive umbilicus but no hernia. Mild red heat rash on his abdomen but no ulcerations nor blistering.  Male Genitourinary Sexual Maturity Tanner 5 - Adult hair pattern and Adult penile size and shape. Note: Normal external genitalia. Epididymi, testes, and spermatic cords normal without any masses. No inguinal hernias.  Rectal Note:  Deferred given recent colonoscopy.  Peripheral Vascular Upper Extremity Inspection - Left - No Cyanotic nailbeds, Not Ischemic. Right - No Cyanotic nailbeds, Not Ischemic.  Neurologic Neurologic evaluation reveals -normal attention span and ability to concentrate, able to name objects and repeat phrases. Appropriate fund of knowledge , normal sensation and normal coordination. Mental Status Affect - not angry, not paranoid. Cranial Nerves-Normal Bilaterally. Gait-Normal.  Neuropsychiatric Mental status exam performed with findings of-able to articulate well with normal speech/language, rate, volume and coherence, thought content normal with ability to perform basic computations and apply abstract reasoning and no evidence of hallucinations, delusions, obsessions or homicidal/suicidal ideation.  Musculoskeletal Global Assessment Spine, Ribs and Pelvis - no instability, subluxation or laxity. Right Upper Extremity - no instability, subluxation or laxity.  Lymphatic Head & Neck  General Head & Neck Lymphatics: Bilateral - Description - No Localized lymphadenopathy. Axillary  General Axillary Region: Bilateral - Description - No Localized lymphadenopathy. Femoral & Inguinal  Generalized Femoral & Inguinal Lymphatics: Left - Description - No  Localized lymphadenopathy. Right - Description - No Localized lymphadenopathy.    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 22 cm from anal verge. Consistent with adenocarcinoma.  There is no evidence of any local nor distant disease based on CT of chest abdomen and pelvis done just a few months before. CEA is normal.  I think he would benefit from segmental colonic resection. Good candidate for robotic surgery given his morbid obesity to minimize his incision. We'll do on table rigid proctoscopy to make sure it is not truly in the rectum. Allow a chance to tattoo the cacner as well.  He got cleared from cardiology. His aggressive workup for atypical chest pain was underwhelming in September.  No new events.  Ready for surgery   ENCOUNTER FOR PREOPERATIVE EXAMINATION FOR GENERAL SURGICAL PROCEDURE (Z01.818)  Current Plans You are being scheduled for surgery - Our schedulers will call you.  You should hear from our office's scheduling department within 5 working days about the location, date, and time of surgery. We try to make accommodations for patient's preferences in scheduling surgery, but sometimes the OR schedule or the surgeon's schedule prevents [place] from making those accommodations.  If you have not heard from our office [phone] in 5 working days, call the office and ask for your surgeon's nurse.  If you have other questions about your diagnosis, plan, or surgery, call the office and ask for your surgeon's nurse.  Written instructions provided Pt Education - [facility] Colon Bowel Prep 2015 Miralax/Antibiotics Started Neomycin Sulfate 500MG , 2 (two) Tablet SEE NOTE, #6, 07/31/2015, No Refill. Local Order: TAKE TWO TABLETS AT 2 PM, 3 PM, AND 10 PM THE DAY PRIOR TO SURGERY Started Flagyl 500MG , 2 (two) Tablet SEE NOTE, #6, 07/31/2015, No Refill. Local Order: Take at 2pm, 3pm, and 10pm the day prior to your colon operation Pt  Education - [facility] Colectomy post-op instructions: discussed with patient and provided information. Pt Education - Pamphlet Given - Laparoscopic Colorectal Surgery: discussed with patient and provided information. Pt Education - [place] (Ladesha Pacini) Pt Education - [facility] Pain Control (Meriam Chojnowski) I recommended obtaining preoperative cardiac clearance. I am concerned about the health of the patient and the ability to tolerate the operation. Therefore, we will request clearance by cardiology to better assess operative risk & see if a reevaluation, further workup, etc is needed. Had a rather thorough workup in September. Does have dilated aortic root  but no major valvular insufficiency. Also recommendations on how medications such as for anticoagulation and blood pressure should be managed/held/restarted after surgery.  Chad [name], M.D., F.A.C.S. Gastrointestinal and Minimally Invasive Surgery [facility] [address], [place], [place] [address] [phone] Main / Paging

## 2015-10-18 NOTE — Brief Op Note (Signed)
10/18/2015  11:42 AM  PATIENT:  Chad [name]  45 y.o. male  PRE-OPERATIVE DIAGNOSIS:  Difficult Foley  [name]-OPERATIVE DIAGNOSIS:   urethral stricture  PROCEDURE:  Cystocopy, Urethral dilation, catheter placement complicated  SURGEON:  Surgeon(s) and Role:     * Alexis Frock, MD - Primary  PHYSICIAN ASSISTANT:   ASSISTANTS: none   ANESTHESIA:   general  EBL:  Total I/O In: 2000 [I.V.:2000] Out: 0   BLOOD ADMINISTERED:none  DRAINS: none   LOCAL MEDICATIONS USED:  NONE  SPECIMEN:  No Specimen  DISPOSITION OF SPECIMEN:  N/A  COUNTS:  YES  TOURNIQUET:  * No tourniquets in log *  DICTATION: .Other Dictation: Dictation Number [id number]  PLAN OF CARE: Admit to inpatient   PATIENT DISPOSITION:  remain in OR stable for general surgical portions of procedure   Delay start of Pharmacological VTE agent (>24hrs) due to surgical blood loss or risk of bleeding: not applicable

## 2015-10-18 NOTE — Op Note (Signed)
NAME:  Chad [name], Chad [name] NO.:  [id number]  MEDICAL RECORD NO.:  [id number]  LOCATION:  [facility]                         FACILITY:  [facility]  PHYSICIAN:  Alexis Frock, MD     DATE OF BIRTH:  12-Oct-1970  DATE OF PROCEDURE: 10/18/2015                              OPERATIVE REPORT  PREOPERATIVE DIAGNOSIS:  Difficult Foley placement.  POSTOPERATIVE DIAGNOSIS:  High-grade membranous urethral stricture.  PROCEDURES: 1. Cystoscopy. 2. Urethral dilation. 3. Catheter placement, complicated.  ESTIMATED BLOOD LOSS:  Nil.  COMPLICATION:  None.  SPECIMEN:  None.  FINDINGS: 1. High-grade short-segment membranous urethral stricture, estimate 1-     2 mm predilation, 8-10 mm postdilation. 2. Unremarkable urinary bladder. 3. Successful placement of 20-French Council catheter in the urinary     bladder.  INDICATION:  [name] is a 45 year old gentleman, who is undergoing segmental colon resection today under the care of Dr. Johney Maine.  He was noted after prepping the patient to have difficulty placing Foley catheter with extreme resistance.  A second attempt of Coude catheter was attempted, but again resistance was failed and intraoperative consultation was sought.  Given the constellation of findings, it was felt that emergent cystoscopy, possible dilation and catheter placement was warranted to surgeon.  Emergent consent was obtained.  PROCEDURE IN DETAIL:  The patient being Chad [name], was verified. Procedure being cystoscopy with catheter placement was confirmed. Procedure was carried out.  Time-out was performed.  There was confirmation of intravenous antibiotics.  A sterile field was created by prepping and draping the patient's penis, perineum, and proximal thighs using iodine.  His abdomen had already been prepped with chlorhexidine gluconate.  Cystourethroscopy was performed using a 16-French flexible cystoscope, which revealed a widely patent pendulous urethra;  however, at the area of the proximal bulbar and membranous urethral junction, there was a pale-appearing, high-grade short-segment stricture estimated 1-2 mm in diameter.  This did not appear amenable to navigation with the cystoscope.  As such, a Sensor type wire was advanced through this approximately 10 cm, over which, a 24-French NephroMax balloon dilation apparatus was carefully positioned under cystoscopic vision and this inflated to pressure of 20 atmospheres, held for 90 seconds and removed keeping the Sensor guidewire in place.  Cystoscopy proximal to the area then revealed complete resolution of the stricture, widely patent prostatic urethra.  Bladder with minimal trabeculation, no obvious lesions.  The cystoscope was then removed and a new 20-French Council- type catheter was placed over the Sensor working wire at the level of the urinary bladder as verified by efflux of copious clear urine, 10 mL of sterile water was placed in the balloon and hemostasis appeared excellent.  This was connected to straight drain and the urologic portion of the procedure was terminated.  The patient was handed back over to General Surgery team for his additional surgery.  The patient's family was updated on intraoperative findings and on the procedure that I performed, and the patient will remain on active consult service.          ______________________________ Alexis Frock, MD     TM/MEDQ  D:  10/18/2015  T:  10/18/2015  Job:  [id number]

## 2015-10-18 NOTE — Anesthesia Procedure Notes (Signed)
Procedure Name: Intubation Date/Time: 10/18/2015 8:52 AM Performed by: West Pugh Pre-anesthesia Checklist: Patient identified, Emergency Drugs available, Suction available, Patient being monitored and Timeout performed Patient Re-evaluated:Patient Re-evaluated prior to inductionOxygen Delivery Method: Circle system utilized Preoxygenation: Pre-oxygenation with 100% oxygen Intubation Type: IV induction Ventilation: Two handed mask ventilation required and Oral airway inserted - appropriate to patient size Laryngoscope Size: Mac and 3 Grade View: Grade I Tube type: Oral Tube size: 8.0 mm Number of attempts: 2 Airway Equipment and Method: Stylet Placement Confirmation: ETT inserted through vocal cords under direct vision,  positive ETCO2,  CO2 detector and breath sounds checked- equal and bilateral Secured at: 23 cm Tube secured with: Tape Dental Injury: Teeth and Oropharynx as per pre-operative assessment  Comments: CRNA attempt x 1 with grade 1 view with MAC 4, tube slipped above cords. Anesthesiologist attempt time one with success with MAC 3.

## 2015-10-18 NOTE — Interval H&P Note (Signed)
History and Physical Interval Note:  10/18/2015 8:20 AM  Chad [name]  has presented today for surgery, with the diagnosis of Sigmoid colon cancer  The various methods of treatment have been discussed with the patient and family. After consideration of risks, benefits and other options for treatment, the patient has consented to  Procedure(s): XI ROBOT ASSISTED LAPAROSCOPIC SIGMOID COLECTOMY RIGID PROCTOSCOPY WITH TATTOOING  (N/A) as a surgical intervention .  The patient's history has been reviewed, patient examined, no change in status, stable for surgery.  I have reviewed the patient's chart and labs.  Questions were answered to the patient's satisfaction.     Chad [name] C.

## 2015-10-18 NOTE — Consult Note (Signed)
Reason for Consult: Difficult Foley / Urethral Stricture  Referring Physician: Michael Boston MD  Chad [name] is an 45 y.o. male.   HPI:   1 - Difficult Foley / Urethral Stricture - pt with dificult to place foley noted after induction of anesthesia as part of elective sigmoid resection for neoplasm. Stat intraoperative consultation sought as pt with need for catheter drainage peri-op (several hour procedure, laparoscopic with pnumoperitoneum). Pt's family denies baseline bother from urinary symptoms or recolection of obvious straddle injury.  Intraoperative cysto reveals high grade short segment membranous - proximal bulbar stricture, approximately 64F pre-dilation, 65F post-dilation.  Today "Chad [name]" is seen as stat intra-operative consult for above.   Past Medical History  Diagnosis Date  . GERD (gastroesophageal reflux disease)   . Hypertension   . Diabetes mellitus without complication ([place]) 0000000    oral agents only (07/2015)  . Gallstone 12/2014    GB stones and sludge on ultrasound and CT,  . Morbid obesity with BMI of 50.0-59.9, adult ([place]) 03/2013  . Fatty liver 12/2014    on ultrasound. pt obese and abuses ETOH  . ETOH abuse 2014  . Rectal bleeding 05/2015    07/2015 colonoscopy with malignant appearing sigmoid mass. Additional rectal, descending, polyps  . Coronary artery disease   . Shoulder pain, bilateral     due to previous injury  . Cancer [facility])     colon cancer    Past Surgical History  Procedure Laterality Date  . Cardiac catheterization N/A 04/06/2015    Procedure: Left Heart Cath and Coronary Angiography;  Surgeon: Belva Crome, MD;  Location: [facility];  Service: Cardiovascular;  Laterality: N/A;  . Colonoscopy N/A 07/25/2015    Procedure: COLONOSCOPY;  Surgeon: Gatha Mayer, MD;  Location: [facility];  Service: Gastroenterology;  Laterality: N/A;  . Esophagogastroduodenoscopy N/A 07/25/2015    Procedure: ESOPHAGOGASTRODUODENOSCOPY (EGD);   Surgeon: Gatha Mayer, MD;  Location: [facility];  Service: Gastroenterology;  Laterality: N/A;    Family History  Problem Relation Age of Onset  . Diabetes Father     Social History:  reports that he has never smoked. He uses smokeless tobacco. He reports that he drinks alcohol. He reports that he does not use illicit drugs.  Allergies: No Known Allergies  Medications: I have reviewed the patient's current medications.  Results for orders placed or performed during the hospital encounter of 10/18/15 (from the past 48 hour(s))  Glucose, capillary     Status: Abnormal   Collection Time: 10/18/15  6:31 AM  Result Value Ref Range   Glucose-Capillary 186 (H) 65 - 99 mg/dL   Comment 1 Notify RN     No results found.  Review of Systems  Unable to perform ROS: intubated   Blood pressure 165/94, pulse 102, temperature 97.9 F (36.6 C), temperature source Oral, resp. rate 20, height 6\' 3"  (1.905 m), weight 185.975 kg (410 lb), SpO2 100 %. Physical Exam  Constitutional: He appears well-developed.  HENT:  Head: Normocephalic.  ETT in place  Cardiovascular: Normal rate.   By bedside monitor  GI:  Significant truncal obesity and panus  Genitourinary: Penis normal.  circ'd phallus straight, testes descended bilaterally  Neurological:  GCS 3T  Skin: Skin is warm.    Assessment/Plan:  1 - Difficult Foley / Urethral Stricture - emergent two surgeon consent obtained for cysto and possible dilation which was performed as per separate OR note. Pt now with 9F foley in situ functioning well  which ideally will remain 3-5 days post-op. Discussed intraoperative findings and dilation procedure with family and they are aware of plan.  Will follow, please call me directly with questions anytime.   Chad [name] 10/18/2015, 12:33 PM

## 2015-10-18 NOTE — Transfer of Care (Signed)
Immediate Anesthesia Transfer of Care Note  Patient: Chad [name]  Procedure(s) Performed: Procedure(s): XI ROBOT ASSISTED LOW ANTERIOR RESECTION WITH  RIGID PROCTOSCOPY WITH TATTOOING  (N/A) CYSTOSCOPY FLEXIBLE URETHRAL DIALATION AND FOLEY PLACEMENT  Patient Location: PACU  Anesthesia Type:General  Level of Consciousness:  sedated, patient cooperative and responds to stimulation  Airway & Oxygen Therapy:Patient Spontanous Breathing and Patient connected to face mask oxgen  Post-op Assessment:  Report given to PACU RN and Post -op Vital signs reviewed and stable  Post vital signs:  Reviewed and stable  Last Vitals:  Filed Vitals:   10/18/15 0638 10/18/15 1337  BP: 165/94 188/96  Pulse: 102 103  Temp: 36.6 C 36.3 C  Resp: 20 19    Complications: No apparent anesthesia complications

## 2015-10-18 NOTE — Op Note (Addendum)
10/18/2015  1:31 PM  PATIENT:  Chad Chad [name]  45 y.o. male  Patient Care Team: Chad Knapp, MD as PCP - General (Family Medicine) Chad Boston, MD as Consulting Physician (General Surgery) Chad Records, MD as Consulting Physician (Cardiology) Chad Mayer, MD as Consulting Physician (Gastroenterology) Chad Frock, MD as Consulting Physician (Urology)  PRE-OPERATIVE DIAGNOSIS:  Sigmoid colon cancer  Chad [name]-OPERATIVE DIAGNOSIS:  Rectosigmoid colon cancer with urethral stricture  PROCEDURE:   XI ROBOT ASSISTED LOW ANTERIOR RESECTION RIGID PROCTOSCOPY WITH TATTOOING    SURGEON:    Chad Boston, MD  ASSISTANT:    Chad Klein, MD Chad Confer, MD  ANESTHESIA:   local and general  EBL:  Total I/O In: 2000 [I.V.:2000] Out: 400 [Urine:200; Blood:200]  Delay start of Pharmacological VTE agent (>24hrs) due to surgical blood loss or risk of bleeding:  no  DRAINS: none   SPECIMEN:  Source of Specimen:  RECTOSIGMOID COLON with distal anastomotic ring  DISPOSITION OF SPECIMEN:  PATHOLOGY  COUNTS:  YES  PLAN OF CARE: Admit to inpatient   PATIENT DISPOSITION:  PACU - hemodynamically stable.  INDICATION:    Patient with intermittent rectal bleeding.  Underwent endoscopic workup.  Some colon polyps removed.  Bulky or mass and rectosigmoid junction.  Biopsy consistent with adenocarcinoma.  I recommended segmental resection:  The anatomy & physiology of the digestive tract was discussed.  The pathophysiology was discussed.  Natural history risks without surgery was discussed.   I worked to give an overview of the disease and the frequent need to have multispecialty involvement.  I feel the risks of no intervention will lead to serious problems that outweigh the operative risks; therefore, I recommended a partial colectomy to remove the pathology.  Laparoscopic & open techniques were discussed.   Risks such as bleeding, infection, abscess, leak, reoperation, possible ostomy,  hernia, heart attack, death, and other risks were discussed.  I noted a good likelihood this will help address the problem.   Goals of Chad [name]-operative recovery were discussed as well.  We will work to minimize complications.  Educational materials on the pathology had been given in the office.  Questions were answered.    The patient expressed understanding & wished to proceed with surgery.  OR FINDINGS:   Patient had a lesion in the distal sigmoid colon near the rectosigmoid junction at all tattooing.  I retattooed.  No obvious metastatic disease on visceral parietal peritoneum or liver.  The anastomosis rests 13 cm from the anal verge by rigid proctoscopy.  Patient had a 47mm urethral stricture just distal to the prostate in the membranous urethra.  Underwent cystoscopically guided balloon dilatation per Dr. Phebe Colla with [facility].  Please see his operative note.  DESCRIPTION:   Informed consent was confirmed.  The patient underwent general anaesthesia without difficulty.  The patient was positioned appropriately.  VTE prevention in place.  The patient's abdomen was clipped, prepped, & draped in a sterile fashion.  Surgical timeout confirmed our plan  I did rigid proctoscopy to confirm location of the tumor.  Some little bit of tattooing left.  I reapplied more tattoo to better marked the region.  Placed a Foley balloo in the rectum to decompress the rectum.  The OR staff was not able to place a Foley catheter due to resistance.  That despite switching to a coud catheter.  I tried and felt resistance as well.  Therefore intraoperative consultation made with Urology.  Dr. Phebe Colla was able to  come over.  He performed cystoscopy and confirmed a stricture to 2 mm at the membranous urethra just distal to the prostate.  Laid over guide water carefully.  See Dr. Zettie Pho note for further details.  Foley left in place.  He discussed intraoperative findings with the patient's family.  The  patient was reprepped and draped.  The patient was positioned in reverse Trendelenburg.  Abdominal entry was gained using Varess technique with a trach hook on the anterior abdominal wall fascia in the right upper abdomen.  Entry was clean.  I induced carbon dioxide insufflation.  Camera inspection revealed no injury.  Extra ports were carefully placed under direct laparoscopic visualization.  I reflected the greater omentum and the upper abdomen the small bowel in the upper abdomen.  The patient was carefully positioned.  The Intuitive daVinci robot was carefully docked with camera & instruments carefully placed.  Given his morbid obesity and difficulty with ventilation, he was initially placed at 12 and later a 10 mmHg.  Stayed less than 20 Trendelenburg positioning  The patient had all the new tattooing at the rectosigmoid junction.  Rather tortuous sigmoid colon.  Very thickened mesentery consistent with his morbid obesity.  I elevated the rectosigmoid junction.  I scored the base of peritoneum of the medial side of the mesentery of the left colon from the ligament of Treitz to the peritoneal reflection of the mid rectum.   Also mobilized the lateral adhesions to the white line to help straighten out the somewhat corkscrew rectosigmoid colon.  I elevated the sigmoid mesentery and entered into the retro-mesenteric plane. We were able to identify the left ureter and gonadal vessels. We kept those posterior within the retroperitoneum and elevated the left colon mesentery off that. I did isolate the inferior mesenteric artery (IMA) pedicle but did not ligate it yet.  I continued distally and got into the avascular plane posterior to the mesorectum. This allowed me to help mobilize the rectum as well by freeing the mesorectum off the sacrum.  I mobilized the peritoneal coverings towards the peritoneal reflection on both the right and left sides of the rectum.  I stayed away from the right and left ureters.  I  kept the lateral vascular pedicles to the rectum intact.  I skeletonized the lymph nodes off the inferior mesenteric artery pedicle.  I went down to its takeoff from the aorta.  I isolated the inferior mesenteric vein off of the ligament of Treitz just cephalad to that as well.  After confirming the left ureter was out of the way, I went ahead and ligated the inferior mesenteric artery pedicle just near its takeoff from the aorta.  I did ligate the inferior mesenteric vein in a similar fashion.  We ensured hemostasis. I skeletonized the mesorectum at the junction at the proximal rectum for the distal point of resection.  I mobilized the left colon in a lateral to medial fashion off the line of Toldt up towards the splenic flexure to ensure good mobilization of the remaining left colon to reach into the pelvis.  I skeletonized at the proximal mesorectum and transected at the proximal rectum using a robotic 45 mm stapler.  Green load came across 85%.  One more load did the last 15%.  I chose a region at the descending/sigmoid junction that was soft and easily reached down to the rectal stump.  I transected the mesentery of the colon radially to preserve remaining colon blood supply just proximal to the IMV/IMA pedicle.  I had anesthesia inject firefly Green fluorescent marker intravenously.  Did rebolus one time.  We could see the colon light up green right to the anticipated transection point at the descending/sigmoid junction.  Rectal stump was viable as well.  I placed a wound protector through a Pfannenstiel incision in the 12 mm port site in the left lateral abdomen splitting through the rectus muscle.  I did have to transect a little bit of rectus muscle as it was rather thick and stiff.  I was able to eviscerate the rectosigmoid and descending colon out the wound.   I clamped the colon proximal to this area using a soft bowel clamp. I transected at the descending/sigmoid junction with a scalpel. I got  healthy bleeding mucosa.  We sent the rectosigmoid colon specimen off to go to pathology.  We sized the colon orifice.  I chose a 33 EEA anvil stapler system. I placed the anvil to the open end of the proximal remaining colon and closed around it using a 0 Prolene pursestring.  We did copious irrigation with crystalloid solution.  Hemostasis was good.  The distal end of the remaining colon easily reached down to the rectal stump, therefore, splenic flexure mobilization was not needed.      At this point Dr. Barry Dienes had to leave and Dr. Zella Richer came in to help complete assisting the case.  Dr. Zella Richer scrubbed down and did gentle anal dilation and advanced the EEA stapler up the rectal stump. The spike was brought out at the provimal end of the rectal stump under direct visualization.  I attached the anvil of the proximal colon the spike of the stapler. Anvil was tightened down and held clamped for 60 seconds. The EEA stapler was fired and held clamped for 30 seconds. The stapler was released & removed. We noted 2 excellent anastomotic rings. Blue stitch is in the proximal ring.  Dr Zella Richer did rigid proctoscopy noted the anastomosis was at 13 cm from the anal verge consistent with the proximal rectum.  We did a final irrigation of antibiotic solution (900 mg clindamycin/240 mg gentamicin in a liter of crystalloid) & held that for the pelvic air leak test .  The rectum was insufflated the rectum while clamping the colon proximal to that anastomosis.  There was a negative air leak test. There was no tension of mesentery or bowel at the anastomosis.   Tissues looked viable.  Ureters & bowel uninjured.  The anastomosis looked healthy.  Endoluminal gas was evacuated.  Ports & wound protector removed.  We changed gloves.  We aspirated the antibiotic irrigation.  Hemostasis was good.  Sterile unused instruments were used from this point out per colon SSI prevention protocol.  I closed the 70mm port sites using  Monocryl stitch and sterile dressing.  I closed the Pfannenstiel wound using #1 PDS transverse anterior rectal fascial closure, starting from each corner and meeting in the center.  Running.  I closed the subcutaneous tissue with some interrupted Monocryl sutures at Scarpa's to close the dead space down.  I closed the skin with some interrupted Monocryl stitches. I placed antibiotic-soaked wicks into the closure at the corners x2. I placed a sterile dressing.    Patient is being extubated go to recovery room. I had discussed postop care with the patient in detail the office & in the holding area. Instructions are written. I updated the status of the patient to the patient's family.  I made recommendations.  I answered questions.  Understanding & appreciation was expressed.   [name], M.D., F.A.C.S. Gastrointestinal and Minimally Invasive Surgery [facility] [address], [place], [place] [address] [phone] Main / Paging

## 2015-10-19 LAB — MAGNESIUM: MAGNESIUM: 1.6 mg/dL — AB (ref 1.7–2.4)

## 2015-10-19 LAB — BASIC METABOLIC PANEL
ANION GAP: 7 (ref 5–15)
BUN: 9 mg/dL (ref 6–20)
CALCIUM: 8.1 mg/dL — AB (ref 8.9–10.3)
CHLORIDE: 102 mmol/L (ref 101–111)
CO2: 27 mmol/L (ref 22–32)
CREATININE: 0.86 mg/dL (ref 0.61–1.24)
GFR calc Af Amer: >60 mL/min (ref >60)
GFR calc non Af Amer: >60 mL/min (ref >60)
Glucose, Bld: 214 mg/dL — ABNORMAL HIGH (ref 65–99)
Potassium: 4.5 mmol/L (ref 3.5–5.1)
SODIUM: 136 mmol/L (ref 135–145)

## 2015-10-19 LAB — CBC
HCT: 36.3 % — ABNORMAL LOW (ref 39.0–52.0)
HEMOGLOBIN: 12.5 g/dL — AB (ref 13.0–17.0)
MCH: 31.9 pg (ref 26.0–34.0)
MCHC: 34.4 g/dL (ref 30.0–36.0)
MCV: 92.6 fL (ref 78.0–100.0)
PLATELETS: 283 10*3/uL (ref 150–400)
RBC: 3.92 MIL/uL — AB (ref 4.22–5.81)
RDW: 12.8 % (ref 11.5–15.5)
WBC: 12.7 10*3/uL — AB (ref 4.0–10.5)

## 2015-10-19 LAB — HEMOGLOBIN A1C
Hgb A1c MFr Bld: 7.2 % — ABNORMAL HIGH (ref 4.8–5.6)
Mean Plasma Glucose: 160 mg/dL

## 2015-10-19 LAB — GLUCOSE, CAPILLARY
GLUCOSE-CAPILLARY: 213 mg/dL — AB (ref 65–99)
GLUCOSE-CAPILLARY: 156 mg/dL — AB (ref 65–99)
GLUCOSE-CAPILLARY: 150 mg/dL — AB (ref 65–99)
Glucose-Capillary: 218 mg/dL — ABNORMAL HIGH (ref 65–99)

## 2015-10-19 MED ORDER — CARVEDILOL 6.25 MG PO TABS
6.2500 mg | ORAL_TABLET | Freq: Two times a day (BID) | ORAL | Status: DC
  Administered 2015-10-20 – 2015-10-23 (×7): 6.25 mg via ORAL
  Filled 2015-10-19 (×10): qty 1

## 2015-10-19 MED ORDER — SODIUM CHLORIDE 0.9 % IV SOLN: 250.0000 mL | INTRAVENOUS | Status: DC | PRN

## 2015-10-19 MED ORDER — LORAZEPAM 1 MG PO TABS
1.0000 mg | ORAL_TABLET | Freq: Every day | ORAL | Status: DC
  Administered 2015-10-19 – 2015-10-22 (×4): 1 mg via ORAL
  Filled 2015-10-19 (×4): qty 1

## 2015-10-19 MED ORDER — SODIUM CHLORIDE 0.9% FLUSH
3.0000 mL | Freq: Two times a day (BID) | INTRAVENOUS | Status: DC
  Administered 2015-10-19 – 2015-10-23 (×8): 3 mL via INTRAVENOUS

## 2015-10-19 MED ORDER — METHOCARBAMOL 500 MG PO TABS
1000.0000 mg | ORAL_TABLET | Freq: Four times a day (QID) | ORAL | Status: DC
  Administered 2015-10-19 – 2015-10-23 (×16): 1000 mg via ORAL
  Filled 2015-10-19 (×16): qty 2

## 2015-10-19 MED ORDER — LISINOPRIL 20 MG PO TABS
20.0000 mg | ORAL_TABLET | Freq: Every day | ORAL | Status: DC
  Administered 2015-10-20 – 2015-10-23 (×4): 20 mg via ORAL
  Filled 2015-10-19 (×4): qty 1

## 2015-10-19 MED ORDER — LACTATED RINGERS IV BOLUS (SEPSIS): 1000.0000 mL | Freq: Three times a day (TID) | INTRAVENOUS | Status: AC | PRN

## 2015-10-19 MED ORDER — HYDROMORPHONE HCL 1 MG/ML IJ SOLN
1.0000 mg | INTRAMUSCULAR | Status: DC | PRN
  Administered 2015-10-19 – 2015-10-22 (×10): 1 mg via INTRAVENOUS
  Filled 2015-10-19 (×11): qty 1

## 2015-10-19 MED ORDER — SODIUM CHLORIDE 0.9% FLUSH: 3.0000 mL | INTRAVENOUS | Status: DC | PRN

## 2015-10-19 MED ORDER — HYDROCHLOROTHIAZIDE 25 MG PO TABS
25.0000 mg | ORAL_TABLET | Freq: Every day | ORAL | Status: DC
  Administered 2015-10-20 – 2015-10-23 (×4): 25 mg via ORAL
  Filled 2015-10-19 (×4): qty 1

## 2015-10-19 NOTE — Progress Notes (Signed)
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 Active Problems:   Type 2 diabetes mellitus with hyperglycemia ([place])   Morbid obesity ([place])   Essential hypertension, benign   Neuropathy due to type 2 diabetes mellitus ([facility])   Chronic systolic HF (heart failure) ([facility])   Gastroesophageal reflux disease without esophagitis   Benign neoplasm of rectosigmoid junction   Membranous urethral stricture s/p balloon dilitatiopn 10/18/2015   S/P colon resection   1 Day Post-Op  10/18/2015  POST-OPERATIVE DIAGNOSIS: Rectosigmoid colon cancer with urethral stricture  PROCEDURE:  XI ROBOT ASSISTED LOW ANTERIOR RESECTION RIGID PROCTOSCOPY WITH TATTOOING    SURGEON:   Michael Boston, MD  ASSISTANT:   Stark Klein, MD Jackolyn Confer, MD   Recovering  Plan:  -adv diet per protocol -DM control -BP control - inc Coreg with HR 100s & follow -GERD control -VTE prophylaxis- SCDs, etc -mobilize as tolerated to help recovery -f/u pathology  D/C patient from hospital when patient meets criteria (anticipate in 2-3 day(s)):  Tolerating oral intake well Ambulating well Adequate pain control without IV medications Urinating  Having flatus Disposition planning in place  I updated the patient's status to the patient & the patient's nurse.  Recommendations were made.  Questions were answered.    They expressed understanding & appreciation.     [name], M.D., F.A.C.S. Gastrointestinal and Minimally Invasive Surgery [facility] [address], [place], [place] [address] [phone] Main / Paging   10/19/2015  Subjective:  Sore - Dilaudid helps Walked in hallways Tol  liquids  Objective:  Vital signs:  Filed Vitals:   10/18/15 2201 10/19/15 0100 10/19/15 0500 10/19/15 0508  BP: 160/75 112/69  100/72  Pulse: 105 106  96  Temp: 98 F (36.7 C) 98.9 F (37.2 C)  98.9 F (37.2 C)  TempSrc: Oral Oral  Oral  Resp: '21 18  20  ' Height:      Weight:   185.975 kg (410 lb)   SpO2: 92% 98%  98%       Intake/Output   Yesterday:  04/05 0701 - 04/06 0700 In: 3868.3 [I.V.:3818.3; IV Piggyback:50] Out: 975 [Urine:775; Blood:200] This shift:     Bowel function:  Flatus: n  BM: n  Drain: n/a  Physical Exam:  General: Pt awake/alert/oriented x4 in no acute distress Eyes: PERRL, normal EOM.  Sclera clear.  No icterus Neuro: CN II-XII intact w/o focal sensory/motor deficits. Lymph: No head/neck/groin lymphadenopathy Psych:  No delerium/psychosis/paranoia.  Less anxious. HENT: Normocephalic, Mucus membranes moist.  No thrush Neck: Supple, No tracheal deviation Chest: No chest wall pain w good excursion CV:  Pulses intact.  Regular rhythm MS: Normal AROM mjr joints.  No obvious deformity Abdomen: Soft.  Nondistended.  Mildly tender at incisions only.  No evidence of peritonitis.  No incarcerated hernias. Ext:  SCDs BLE.  No mjr edema.  No cyanosis Skin: No petechiae / purpura  Results:   Labs: Results for orders placed or performed during the hospital encounter of 10/18/15 (from the past 48 hour(s))  Glucose, capillary     Status: Abnormal   Collection Time: 10/18/15  6:31 AM  Result Value Ref Range   Glucose-Capillary 186 (H) 65 - 99 mg/dL   Comment 1 Notify  RN   Hemoglobin A1c     Status: Abnormal   Collection Time: 10/18/15  7:25 AM  Result Value Ref Range   Hgb A1c MFr Bld 7.2 (H) 4.8 - 5.6 %    Comment: (NOTE)         Pre-diabetes: 5.7 - 6.4         Diabetes: >6.4         Glycemic control for adults with diabetes: <7.0    Mean Plasma Glucose 160 mg/dL    Comment: (NOTE) Performed At: [facility] [place], [place] [id number] Lindon Romp MD BD:[id number]   Glucose, capillary     Status: Abnormal   Collection Time: 10/18/15  1:43 PM  Result Value Ref Range   Glucose-Capillary 199 (H) 65 - 99 mg/dL   Comment 1 Notify RN    Comment 2 Document in Chart   Glucose, capillary     Status: Abnormal   Collection Time: 10/18/15  2:51 PM  Result Value Ref Range   Glucose-Capillary 234 (H) 65 - 99 mg/dL   Comment 1 Notify RN    Comment 2 Document in Chart   Glucose, capillary     Status: Abnormal   Collection Time: 10/18/15  3:30 PM  Result Value Ref Range   Glucose-Capillary 218 (H) 65 - 99 mg/dL  CBC     Status: Abnormal   Collection Time: 10/18/15  4:50 PM  Result Value Ref Range   WBC 15.8 (H) 4.0 - 10.5 K/uL   RBC 4.54 4.22 - 5.81 MIL/uL   Hemoglobin 14.6 13.0 - 17.0 g/dL   HCT 41.6 39.0 - 52.0 %   MCV 91.6 78.0 - 100.0 fL   MCH 32.2 26.0 - 34.0 pg   MCHC 35.1 30.0 - 36.0 g/dL   RDW 12.6 11.5 - 15.5 %   Platelets 270 150 - 400 K/uL  Creatinine, serum     Status: None   Collection Time: 10/18/15  4:50 PM  Result Value Ref Range   Creatinine, Ser 0.93 0.61 - 1.24 mg/dL   GFR calc non Af Amer >60 >60 mL/min   GFR calc Af Amer >60 >60 mL/min    Comment: (NOTE) The eGFR has been calculated using the CKD EPI equation. This calculation has not been validated in all clinical situations. eGFR's persistently <60 mL/min signify possible Chronic Kidney Disease.   Glucose, capillary     Status: Abnormal   Collection Time: 10/18/15  6:29 PM  Result Value Ref Range   Glucose-Capillary 209 (H) 65 - 99 mg/dL  Glucose, capillary     Status: Abnormal   Collection Time: 10/18/15  9:54 PM  Result Value Ref Range   Glucose-Capillary 205 (H) 65 - 99 mg/dL  Basic metabolic panel     Status: Abnormal   Collection Time: 10/19/15  4:18 AM  Result Value Ref Range   Sodium 136 135 - 145 mmol/L   Potassium 4.5 3.5 - 5.1 mmol/L   Chloride 102 101 - 111 mmol/L   CO2 27 22 - 32 mmol/L    Glucose, Bld 214 (H) 65 - 99 mg/dL   BUN 9 6 - 20 mg/dL   Creatinine, Ser 0.86 0.61 - 1.24 mg/dL   Calcium 8.1 (L) 8.9 - 10.3 mg/dL   GFR calc non Af Amer >60 >60 mL/min   GFR calc Af Amer >60 >60 mL/min    Comment: (NOTE) The eGFR has been calculated using the CKD EPI equation. This  calculation has not been validated in all clinical situations. eGFR's persistently <60 mL/min signify possible Chronic Kidney Disease.    Anion gap 7 5 - 15  CBC     Status: Abnormal   Collection Time: 10/19/15  4:18 AM  Result Value Ref Range   WBC 12.7 (H) 4.0 - 10.5 K/uL    Comment: WHITE COUNT CONFIRMED ON SMEAR   RBC 3.92 (L) 4.22 - 5.81 MIL/uL   Hemoglobin 12.5 (L) 13.0 - 17.0 g/dL   HCT 36.3 (L) 39.0 - 52.0 %   MCV 92.6 78.0 - 100.0 fL   MCH 31.9 26.0 - 34.0 pg   MCHC 34.4 30.0 - 36.0 g/dL   RDW 12.8 11.5 - 15.5 %   Platelets 283 150 - 400 K/uL  Magnesium     Status: Abnormal   Collection Time: 10/19/15  4:18 AM  Result Value Ref Range   Magnesium 1.6 (L) 1.7 - 2.4 mg/dL  Glucose, capillary     Status: Abnormal   Collection Time: 10/19/15  7:53 AM  Result Value Ref Range   Glucose-Capillary 218 (H) 65 - 99 mg/dL    Imaging / Studies: No results found.  Medications / Allergies: per chart  Antibiotics: Anti-infectives    Start     Dose/Rate Route Frequency Ordered Stop   10/18/15 2200  cefoTEtan (CEFOTAN) 2 g in dextrose 5 % 50 mL IVPB     2 g 100 mL/hr over 30 Minutes Intravenous Every 12 hours 10/18/15 1620 10/18/15 2238   10/18/15 1630  metroNIDAZOLE (FLAGYL) tablet 1,000 mg  Status:  Discontinued     1,000 mg Oral 3 times daily 10/18/15 1620 10/18/15 1755   10/18/15 1630  neomycin (MYCIFRADIN) tablet 1,000 mg  Status:  Discontinued     1,000 mg Oral 3 times daily 10/18/15 1620 10/18/15 1755   10/18/15 0815  clindamycin (CLEOCIN) 900 mg, gentamicin (GARAMYCIN) 240 mg in sodium chloride 0.9 % 1,000 mL for intraperitoneal lavage      Intraperitoneal To Surgery 10/18/15 0808  10/18/15 1301   10/18/15 0711  cefoTEtan (CEFOTAN) 2 g in dextrose 5 % 50 mL IVPB     2 g 100 mL/hr over 30 Minutes Intravenous On call to O.R. 10/18/15 6384 10/18/15 0925        Note: Portions of this report may have been transcribed using voice recognition software. Every effort was made to ensure accuracy; however, inadvertent computerized transcription errors may be present.   Any transcriptional errors that result from this process are unintentional.     [name], M.D., F.A.C.S. Gastrointestinal and Minimally Invasive Surgery [facility] [address] [place], [place] [address] [phone] Main / Paging   10/19/2015  CARE TEAM:  PCP: Delman Cheadle, MD  Outpatient Care Team: Patient Care Team: Shawnee Knapp, MD as PCP - General (Family Medicine) Michael Boston, MD as Consulting Physician (General Surgery) Fay Records, MD as Consulting Physician (Cardiology) Gatha Mayer, MD as Consulting Physician (Gastroenterology) Alexis Frock, MD as Consulting Physician (Urology)  Inpatient Treatment Team: Treatment Team: Attending Provider: Michael Boston, MD; Consulting Physician: Alexis Frock, MD; Technician: Coralie Carpen, NT

## 2015-10-19 NOTE — Progress Notes (Signed)
Inpatient Diabetes Program Recommendations  AACE/ADA: New Consensus Statement on Inpatient Glycemic Control (2015)  Target Ranges:  Prepandial:   less than 140 mg/dL      Peak postprandial:   less than 180 mg/dL (1-2 hours)      Critically ill patients:  140 - 180 mg/dL   Review of Glycemic Control  Diabetes history: DM2 Outpatient Diabetes medications: glipizide 5 mg Q24H, metformin 2000 mg QAM Current orders for Inpatient glycemic control: Novolog moderate tidwc and hs, glipizide 5 mg QD  Results for SUCCESS, DALLEY (MRN [id number]) as of 10/19/2015 12:26  Ref. Range 10/18/2015 15:30 10/18/2015 18:29 10/18/2015 21:54 10/19/2015 07:53 10/19/2015 11:54  Glucose-Capillary Latest Ref Range: 65-99 mg/dL 218 (H) 209 (H) 205 (H) 218 (H) 213 (H)  Results for BRADIN, HARDIN (MRN [id number]) as of 10/19/2015 12:26  Ref. Range 10/18/2015 07:25  Hemoglobin A1C Latest Ref Range: 4.8-5.6 % 7.2 (H)   HgbA1C indicates good control at home. Blood sugars in 200s. Needs basal insulin.  Inpatient Diabetes Program Recommendations:   Insulin - Basal: Lantus 20 units QHS Correction (SSI): Increase Novolog to resistant tidwc and HS Diet: When tolerating FL diet - advance to CHO mod med   Will continue to follow. Thank you. Lorenda Peck, RD, LDN, CDE Inpatient Diabetes Coordinator [phone]

## 2015-10-19 NOTE — Progress Notes (Signed)
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   Today "Chad [name]" is stable. Abd soreness as expected, some mild hematuria as expected. No high-grade fevers or bothersome spasms.   Objective: Vital signs in last 24 hours: Temp:  [97.4 F (36.3 C)-99.4 F (37.4 C)] 99.4 F (37.4 C) (04/06 0928) Pulse Rate:  [96-114] 104 (04/06 0928) Resp:  [12-22] 20 (04/06 0928) BP: (100-188)/(68-106) 120/68 mmHg (04/06 0928) SpO2:  [90 %-100 %] 98 % (04/06 0928) Weight:  [185.975 kg (410 lb)] 185.975 kg (410 lb) (04/06 0500)    Intake/Output from previous day: 04/05 0701 - 04/06 0700 In: 3868.3 [I.V.:3818.3; IV Piggyback:50] Out: 975 [Urine:775; Blood:200] Intake/Output this shift: Total I/O In: -  Out: 250 [Urine:250]  General appearance: alert, cooperative and appears stated age Head: Normocephalic, without obvious abnormality, atraumatic Eyes: negative Nose: Nares normal. Septum midline. Mucosa normal. No drainage or sinus tenderness. Throat: lips, mucosa, and tongue normal; teeth and gums normal Neck: supple, symmetrical, trachea midline Back: symmetric, no curvature. ROM normal. No CVA tenderness. Resp: non-labored on minimal [place] O2 GI: soft, non-tender; bowel sounds normal; no masses,  no organomegaly and recent port sites / incision sites c/d/i. NO hernias / drainage.  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  Extremities: extremities normal, atraumatic, no cyanosis or edema  Lab Results:   Recent Labs  10/18/15 1650 10/19/15 0418  WBC 15.8* 12.7*  HGB 14.6 12.5*  HCT 41.6 36.3*  PLT 270 283   BMET  Recent Labs  10/18/15 1650 10/19/15 0418  NA  --  136  K  --  4.5  CL  --  102  CO2  --  27  GLUCOSE  --  214*  BUN  --  9  CREATININE 0.93 0.86  CALCIUM  --  8.1*    PT/INR No results for input(s): LABPROT, INR in the last 72 hours. ABG No results for input(s): PHART, HCO3 in the last 72 hours.  Invalid input(s): PCO2, PO2  Studies/Results: No results found.  Anti-infectives: Anti-infectives    Start     Dose/Rate Route Frequency Ordered Stop   10/18/15 2200  cefoTEtan (CEFOTAN) 2 g in dextrose 5 % 50 mL IVPB     2 g 100 mL/hr over 30 Minutes Intravenous Every 12 hours 10/18/15 1620 10/18/15 2238   10/18/15 1630  metroNIDAZOLE (FLAGYL) tablet 1,000 mg  Status:  Discontinued     1,000 mg Oral 3 times daily 10/18/15 1620 10/18/15 1755   10/18/15 1630  neomycin (MYCIFRADIN) tablet 1,000 mg  Status:  Discontinued     1,000 mg Oral 3 times daily 10/18/15 1620 10/18/15 1755   10/18/15 0815  clindamycin (CLEOCIN) 900 mg, gentamicin (GARAMYCIN) 240 mg in sodium chloride 0.9 % 1,000 mL for intraperitoneal lavage      Intraperitoneal To Surgery 10/18/15 0808 10/18/15 1301   10/18/15 0711  cefoTEtan (CEFOTAN) 2 g in dextrose 5 % 50 mL IVPB     2 g 100 mL/hr over 30 Minutes Intravenous On call to O.R. 10/18/15 0711 10/18/15 0925      Assessment/Plan:  1 - Difficult Foley / Urethral Stricture - As pt doing well with current catheter and still mostly in bed, rec keep foley until Monday 4/10 and then we will remove for trial of void. If pt  ready for DC home sooner, foley can come out before discharge.   Will follow prn at this point. Please call me directly with questions anytim.   [facility], Macklin Jacquin 10/19/2015

## 2015-10-20 LAB — GLUCOSE, CAPILLARY
GLUCOSE-CAPILLARY: 181 mg/dL — AB (ref 65–99)
GLUCOSE-CAPILLARY: 173 mg/dL — AB (ref 65–99)
Glucose-Capillary: 128 mg/dL — ABNORMAL HIGH (ref 65–99)
Glucose-Capillary: 138 mg/dL — ABNORMAL HIGH (ref 65–99)

## 2015-10-20 MED ORDER — VITAMIN C 500 MG PO TABS
500.0000 mg | ORAL_TABLET | Freq: Two times a day (BID) | ORAL | Status: DC
  Administered 2015-10-20 – 2015-10-23 (×7): 500 mg via ORAL
  Filled 2015-10-20 (×8): qty 1

## 2015-10-20 NOTE — Discharge Summary (Deleted)
Physician Discharge Summary  Patient ID: Chad [name] MRN: [id number] DOB/AGE: February 05, 1971 45 y.o.  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 Active Problems:   Type 2 diabetes mellitus with hyperglycemia ([place])   Morbid obesity ([place])   Essential hypertension, benign   Neuropathy due to type 2 diabetes mellitus ([place])   Chronic systolic HF (heart failure) ([place])   Gastroesophageal reflux disease without esophagitis   Benign neoplasm of rectosigmoid junction   Membranous urethral stricture s/p balloon dilitatiopn 10/18/2015   S/P colon resection   Discharge Diagnoses:  Principal Problem:   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 Active Problems:   Type 2 diabetes mellitus with hyperglycemia ([place])   Morbid obesity ([place])   Essential hypertension, benign   Neuropathy due to type 2 diabetes mellitus ([facility])   Chronic systolic HF (heart failure) ([facility])   Gastroesophageal reflux disease without esophagitis   Benign neoplasm of rectosigmoid junction   Membranous urethral stricture s/p balloon dilitatiopn 10/18/2015   S/P colon resection   POST-OPERATIVE DIAGNOSIS:   Sigmoid colon cancer with urethral stricture  SURGERY:  10/18/2015  Procedure(s): XI ROBOT ASSISTED LOW ANTERIOR RESECTION WITH  RIGID PROCTOSCOPY WITH TATTOOING  CYSTOSCOPY FLEXIBLE URETHRAL DIALATION AND FOLEY PLACEMENT  SURGEON:    Surgeon(s): Stark Klein, MD Alexis Frock, MD Jackolyn Confer, MD Michael Boston, MD  Consults: None  Hospital Course:   The patient  underwent the surgery above.  Postoperatively, the patient gradually mobilized and advanced to a solid diet.  Pain and other symptoms were treated aggressively.    By the time of discharge, the patient was walking well the hallways, eating food, having flatus.  Pain was well-controlled on an oral medications.  Based on meeting discharge criteria and continuing to recover, I felt it was safe for the patient to be discharged from the hospital to further recover with close followup. Postoperative recommendations were discussed in detail.  They are written as well.   Significant Diagnostic Studies:  Results for orders placed or performed during the hospital encounter of 10/18/15 (from the past 72 hour(s))  Glucose, capillary     Status: Abnormal   Collection Time: 10/18/15  6:31 AM  Result Value Ref Range   Glucose-Capillary 186 (H) 65 - 99 mg/dL   Comment 1 Notify RN   Hemoglobin A1c     Status: Abnormal   Collection Time: 10/18/15  7:25 AM  Result Value Ref Range   Hgb A1c MFr Bld 7.2 (H) 4.8 - 5.6 %    Comment: (NOTE)         Pre-diabetes: 5.7 - 6.4         Diabetes: >6.4         Glycemic control for adults with diabetes: <7.0    Mean Plasma Glucose 160 mg/dL    Comment: (NOTE) Performed At: [facility] [address] [place], [place] [id number] Lindon Romp MD [id number]   Glucose, capillary     Status: Abnormal   Collection Time: 10/18/15  1:43 PM  Result Value Ref Range   Glucose-Capillary 199 (H) 65 - 99 mg/dL   Comment 1 Notify RN  Comment 2 Document in Chart   Glucose, capillary     Status: Abnormal   Collection Time: 10/18/15  2:51 PM  Result Value Ref Range   Glucose-Capillary 234 (H) 65 - 99 mg/dL   Comment 1 Notify RN    Comment 2 Document in Chart   Glucose, capillary     Status: Abnormal   Collection Time: 10/18/15  3:30 PM  Result Value Ref Range   Glucose-Capillary 218 (H) 65 - 99 mg/dL  CBC     Status: Abnormal   Collection Time: 10/18/15   4:50 PM  Result Value Ref Range   WBC 15.8 (H) 4.0 - 10.5 K/uL   RBC 4.54 4.22 - 5.81 MIL/uL   Hemoglobin 14.6 13.0 - 17.0 g/dL   HCT 41.6 39.0 - 52.0 %   MCV 91.6 78.0 - 100.0 fL   MCH 32.2 26.0 - 34.0 pg   MCHC 35.1 30.0 - 36.0 g/dL   RDW 12.6 11.5 - 15.5 %   Platelets 270 150 - 400 K/uL  Creatinine, serum     Status: None   Collection Time: 10/18/15  4:50 PM  Result Value Ref Range   Creatinine, Ser 0.93 0.61 - 1.24 mg/dL   GFR calc non Af Amer >60 >60 mL/min   GFR calc Af Amer >60 >60 mL/min    Comment: (NOTE) The eGFR has been calculated using the CKD EPI equation. This calculation has not been validated in all clinical situations. eGFR's persistently <60 mL/min signify possible Chronic Kidney Disease.   Glucose, capillary     Status: Abnormal   Collection Time: 10/18/15  6:29 PM  Result Value Ref Range   Glucose-Capillary 209 (H) 65 - 99 mg/dL  Glucose, capillary     Status: Abnormal   Collection Time: 10/18/15  9:54 PM  Result Value Ref Range   Glucose-Capillary 205 (H) 65 - 99 mg/dL  Basic metabolic panel     Status: Abnormal   Collection Time: 10/19/15  4:18 AM  Result Value Ref Range   Sodium 136 135 - 145 mmol/L   Potassium 4.5 3.5 - 5.1 mmol/L   Chloride 102 101 - 111 mmol/L   CO2 27 22 - 32 mmol/L   Glucose, Bld 214 (H) 65 - 99 mg/dL   BUN 9 6 - 20 mg/dL   Creatinine, Ser 0.86 0.61 - 1.24 mg/dL   Calcium 8.1 (L) 8.9 - 10.3 mg/dL   GFR calc non Af Amer >60 >60 mL/min   GFR calc Af Amer >60 >60 mL/min    Comment: (NOTE) The eGFR has been calculated using the CKD EPI equation. This calculation has not been validated in all clinical situations. eGFR's persistently <60 mL/min signify possible Chronic Kidney Disease.    Anion gap 7 5 - 15  CBC     Status: Abnormal   Collection Time: 10/19/15  4:18 AM  Result Value Ref Range   WBC 12.7 (H) 4.0 - 10.5 K/uL    Comment: WHITE COUNT CONFIRMED ON SMEAR   RBC 3.92 (L) 4.22 - 5.81 MIL/uL   Hemoglobin 12.5 (L)  13.0 - 17.0 g/dL   HCT 36.3 (L) 39.0 - 52.0 %   MCV 92.6 78.0 - 100.0 fL   MCH 31.9 26.0 - 34.0 pg   MCHC 34.4 30.0 - 36.0 g/dL   RDW 12.8 11.5 - 15.5 %   Platelets 283 150 - 400 K/uL  Magnesium     Status: Abnormal   Collection Time: 10/19/15  4:18  AM  Result Value Ref Range   Magnesium 1.6 (L) 1.7 - 2.4 mg/dL  Glucose, capillary     Status: Abnormal   Collection Time: 10/19/15  7:53 AM  Result Value Ref Range   Glucose-Capillary 218 (H) 65 - 99 mg/dL  Glucose, capillary     Status: Abnormal   Collection Time: 10/19/15 11:54 AM  Result Value Ref Range   Glucose-Capillary 213 (H) 65 - 99 mg/dL  Glucose, capillary     Status: Abnormal   Collection Time: 10/19/15  4:51 PM  Result Value Ref Range   Glucose-Capillary 156 (H) 65 - 99 mg/dL  Glucose, capillary     Status: Abnormal   Collection Time: 10/19/15  9:27 PM  Result Value Ref Range   Glucose-Capillary 150 (H) 65 - 99 mg/dL  Glucose, capillary     Status: Abnormal   Collection Time: 10/20/15  8:01 AM  Result Value Ref Range   Glucose-Capillary 181 (H) 65 - 99 mg/dL    No results found.  Discharge Exam: Blood pressure 129/75, pulse 111, temperature 98.4 F (36.9 C), temperature source Oral, resp. rate 18, height 6' 3" (1.905 m), weight 185.748 kg (409 lb 8 oz), SpO2 98 %.  General: Pt awake/alert/oriented x4 in no major acute distress Eyes: PERRL, normal EOM. Sclera nonicteric Neuro: CN II-XII intact w/o focal sensory/motor deficits. Lymph: No head/neck/groin lymphadenopathy Psych:  No delerium/psychosis/paranoia HENT: Normocephalic, Mucus membranes moist.  No thrush Neck: Supple, No tracheal deviation Chest: No pain.  Good respiratory excursion. CV:  Pulses intact.  Regular rhythm MS: Normal AROM mjr joints.  No obvious deformity Abdomen: Soft, Nondistended.  Min tender at incisions only.  No incarcerated hernias. Ext:  SCDs BLE.  No significant edema.  No cyanosis Skin: No petechiae / purpura  Discharged  Condition: good   Past Medical History  Diagnosis Date  . GERD (gastroesophageal reflux disease)   . Hypertension   . Diabetes mellitus without complication ([place]) 02/2422    oral agents only (07/2015)  . Gallstone 12/2014    GB stones and sludge on ultrasound and CT,  . Morbid obesity with BMI of 50.0-59.9, adult ([place]) 03/2013  . Fatty liver 12/2014    on ultrasound. pt obese and abuses ETOH  . ETOH abuse 2014  . Rectal bleeding 05/2015    07/2015 colonoscopy with malignant appearing sigmoid mass. Additional rectal, descending, polyps  . Coronary artery disease   . Shoulder pain, bilateral     due to previous injury  . Cancer [facility])     colon cancer    Past Surgical History  Procedure Laterality Date  . Cardiac catheterization N/A 04/06/2015    Procedure: Left Heart Cath and Coronary Angiography;  Surgeon: Belva Crome, MD;  Location: [facility];  Service: Cardiovascular;  Laterality: N/A;  . Colonoscopy N/A 07/25/2015    Procedure: COLONOSCOPY;  Surgeon: Gatha Mayer, MD;  Location: [facility];  Service: Gastroenterology;  Laterality: N/A;  . Esophagogastroduodenoscopy N/A 07/25/2015    Procedure: ESOPHAGOGASTRODUODENOSCOPY (EGD);  Surgeon: Gatha Mayer, MD;  Location: [facility];  Service: Gastroenterology;  Laterality: N/A;  . Cystoscopy  10/18/2015    Procedure: CYSTOSCOPY FLEXIBLE URETHRAL DIALATION AND FOLEY PLACEMENT;  Surgeon: Alexis Frock, MD;  Location: [facility];  Service: Urology;;    Social History   Social History  . Marital Status: Legally Separated    Spouse Name: N/A  . Number of Children: N/A  . Years of Education: N/A   Occupational  History  . Not on file.   Social History Main Topics  . Smoking status: Never Smoker   . Smokeless tobacco: Current User     Comment: occasionally dips tobacco  . Alcohol Use: Yes     Comment: 2-3  drinks of liqour a night, has been cutting back.    . Drug Use: No  . Sexual Activity: Not on file   Other Topics  Concern  . Not on file   Social History Narrative    Family History  Problem Relation Age of Onset  . Diabetes Father     Current Facility-Administered Medications  Medication Dose Route Frequency Provider Last Rate Last Dose  . 0.9 %  sodium chloride infusion  250 mL Intravenous PRN Michael Boston, MD      . acetaminophen (TYLENOL) tablet 1,000 mg  1,000 mg Oral TID Michael Boston, MD   1,000 mg at 10/20/15 0957  . alum & mag hydroxide-simeth (MAALOX/MYLANTA) 200-200-20 MG/5ML suspension 30 mL  30 mL Oral Q6H PRN Michael Boston, MD      . alvimopan (ENTEREG) capsule 12 mg  12 mg Oral BID Michael Boston, MD   12 mg at 10/19/15 2157  . aspirin EC tablet 81 mg  81 mg Oral Daily Michael Boston, MD   81 mg at 10/20/15 0959  . carvedilol (COREG) tablet 6.25 mg  6.25 mg Oral BID WC Michael Boston, MD   6.25 mg at 10/20/15 0824  . diphenhydrAMINE (BENADRYL) 12.5 MG/5ML elixir 12.5 mg  12.5 mg Oral Q6H PRN Michael Boston, MD       Or  . diphenhydrAMINE (BENADRYL) injection 12.5 mg  12.5 mg Intravenous Q6H PRN Michael Boston, MD      . enoxaparin (LOVENOX) injection 40 mg  40 mg Subcutaneous Q24H Michael Boston, MD   40 mg at 10/20/15 0825  . glipiZIDE (GLUCOTROL XL) 24 hr tablet 5 mg  5 mg Oral Q breakfast Michael Boston, MD   5 mg at 10/20/15 0824  . lisinopril (PRINIVIL,ZESTRIL) tablet 20 mg  20 mg Oral Daily Michael Boston, MD   20 mg at 10/20/15 0959   And  . hydrochlorothiazide (HYDRODIURIL) tablet 25 mg  25 mg Oral Daily Michael Boston, MD   25 mg at 10/20/15 0958  . HYDROmorphone (DILAUDID) injection 1-2 mg  1-2 mg Intravenous Q1H PRN Michael Boston, MD   1 mg at 10/20/15 0957  . insulin aspart (novoLOG) injection 0-15 Units  0-15 Units Subcutaneous TID WC Michael Boston, MD   3 Units at 10/20/15 0825  . insulin aspart (novoLOG) injection 0-5 Units  0-5 Units Subcutaneous QHS Michael Boston, MD   2 Units at 10/18/15 2208  . lactated ringers bolus 1,000 mL  1,000 mL Intravenous Q8H PRN Michael Boston, MD      . lip  balm (CARMEX) ointment 1 application  1 application Topical BID Michael Boston, MD   1 application at 22/97/98 [phone]  . LORazepam (ATIVAN) injection 0.5-1 mg  0.5-1 mg Intravenous Q8H PRN Michael Boston, MD      . LORazepam (ATIVAN) tablet 1 mg  1 mg Oral QHS Michael Boston, MD   1 mg at 10/19/15 2157  . magic mouthwash  15 mL Oral QID PRN Michael Boston, MD      . menthol-cetylpyridinium (CEPACOL) lozenge 3 mg  1 lozenge Oral PRN Michael Boston, MD      . methocarbamol (ROBAXIN) tablet 1,000 mg  1,000 mg Oral QID Michael Boston, MD   1,000  mg at 10/20/15 0957  . metoprolol (LOPRESSOR) injection 5 mg  5 mg Intravenous Q6H PRN Michael Boston, MD      . ondansetron (ZOFRAN-ODT) disintegrating tablet 4-8 mg  4-8 mg Oral Q6H PRN Michael Boston, MD      . pantoprazole (PROTONIX) EC tablet 80 mg  80 mg Oral Daily Michael Boston, MD   80 mg at 10/20/15 0957  . phenol (CHLORASEPTIC) mouth spray 2 spray  2 spray Mouth/Throat PRN Michael Boston, MD      . prochlorperazine (COMPAZINE) injection 10 mg  10 mg Intravenous Q6H PRN Michael Boston, MD      . saccharomyces boulardii (FLORASTOR) capsule 250 mg  250 mg Oral BID Michael Boston, MD   250 mg at 10/20/15 0957  . sodium chloride flush (NS) 0.9 % injection 3 mL  3 mL Intravenous Q12H Michael Boston, MD   3 mL at 10/20/15 0959  . sodium chloride flush (NS) 0.9 % injection 3 mL  3 mL Intravenous PRN Michael Boston, MD      . vitamin C (ASCORBIC ACID) tablet 500 mg  500 mg Oral BID Michael Boston, MD   500 mg at 10/20/15 0037     No Known Allergies  Disposition: 01-Home or Self Care  Discharge Instructions    Call MD for:  extreme fatigue    Complete by:  As directed      Call MD for:  hives    Complete by:  As directed      Call MD for:  persistant nausea and vomiting    Complete by:  As directed      Call MD for:  redness, tenderness, or signs of infection (pain, swelling, redness, odor or green/yellow discharge around incision site)    Complete by:  As directed      Call MD  for:  severe uncontrolled pain    Complete by:  As directed      Call MD for:    Complete by:  As directed   Temperature > 101.2F     Diet - low sodium heart healthy    Complete by:  As directed   Start with bland, low residue diet for a few days, then advance to a heart healthy (low fat, high fiber) diet.  If you feel nauseated or constipated, simplify to a liquid only diet for 48 hours until you are feeling better (no more nausea, farting/passing gas, having a bowel movement, etc...).  If you cannot tolerate even drinking liquids, or feeling worse, let your surgeon know or go to the Emergency Department for help.     Discharge instructions    Complete by:  As directed   Please see discharge instruction sheets.   Also refer to any handouts/printouts that may have been given from the [facility] surgery office (if you visited [place] there before surgery) Please call our office if you have any questions or concerns [phone]     Discharge wound care:    Complete by:  As directed   If you have closed incisions: Shower and bathe over these incisions with soap and water every day.  It is OK to wash over the dressings: they are waterproof. Remove all surgical dressings on postoperative day #3.  You do not need to replace dressings over the closed incisions unless you feel more comfortable with a Band-Aid covering it.   If you have an open wound: That requires packing, so please see wound care instructions.   In  general, remove all dressings, wash wound with soap and water and then replace with saline moistened gauze.  Do the dressing change at least every day.    Please call our office [phone] if you have further questions.     Driving Restrictions    Complete by:  As directed   No driving until off narcotics and can safely swerve away without pain during an emergency     Increase activity slowly    Complete by:  As directed   Walk an hour a day.  Use 20-30 minute walks.  When you can walk 30  minutes without difficulty, it is fine to restart low impact/moderate activities such as biking, jogging, swimming, sexual activity, etc.  Eventually you can increase to unrestricted activity when not feeling pain.  If you feel pain: STOP!Marland Kitchen   Let pain protect you from overdoing it.  Use ice/heat & over-the-counter pain medications to help minimize soreness.  If that is not enough, then use your narcotic pain prescription as needed to remain active.  It is better to take extra pain medications and be more active than to stay bedridden to avoid all pain medications.     Lifting restrictions    Complete by:  As directed   Avoid heavy lifting initially, <20 pounds at first.   Do not push through pain.   You have no specific weight limit: If it hurts to do, DON'T DO IT.    If you feel no pain, you are not injuring anything.  Pain will protect you from injury.   Coughing and sneezing are far more stressful to your incision than any lifting.   Avoid resuming heavy lifting (>50 pounds) or other intense activity until off all narcotic pain medications.   When want to exercise more, give yourself 2 weeks to gradually get back to full intense exercise/activity.     May shower / Bathe    Complete by:  As directed   Jeff.  It is fine for dressings or wounds to be washed/rinsed.  Use gentle soap & water.  This will help the incisions and/or wounds get clean & minimize infection.     May walk up steps    Complete by:  As directed      Sexual Activity Restrictions    Complete by:  As directed   Sexual activity as tolerated.  Do not push through pain.  Pain will protect you from injury.     Walk with assistance    Complete by:  As directed   Walk over an hour a day.  May use a walker/cane/companion to help with balance and stamina.            Medication List    TAKE these medications        aspirin EC 81 MG tablet  Take 1 tablet (81 mg total) by mouth daily. DO NOT TAKE AGAIN UNTIL 08/09/15      carvedilol 6.25 MG tablet  Commonly known as:  COREG  Take 1 tablet (6.25 mg total) by mouth 2 (two) times daily with a meal.     dicyclomine 10 MG capsule  Commonly known as:  BENTYL  Take 1 capsule (10 mg total) by mouth 4 (four) times daily -  before meals and at bedtime.     glipiZIDE 5 MG 24 hr tablet  Commonly known as:  GLUCOTROL XL  TAKE 1 TABLET (5 MG) BY MOUTH DAILY WITH BREAKFAST.     lisinopril-hydrochlorothiazide  20-25 MG tablet  Commonly known as:  PRINZIDE,ZESTORETIC  Take 1 tablet by mouth daily.     LORazepam 1 MG tablet  Commonly known as:  ATIVAN  TAKE 1 TABLET BY MOUTH EVERY NIGHT AT BEDTIME     metFORMIN 500 MG 24 hr tablet  Commonly known as:  GLUCOPHAGE-XR  Take 4 tablets once a day WITH BREAKFAST     metroNIDAZOLE 500 MG tablet  Commonly known as:  FLAGYL  Take 2 tablets by mouth 3 (three) times daily. To start day before procedure. Take 2 tablets at 2pm, 3pm and 10pm     neomycin 500 MG tablet  Commonly known as:  MYCIFRADIN  Take 2 tablets by mouth 3 (three) times daily. To start day before procedure. Take 2 tablets at 2pm, 3pm and 10pm     omeprazole 40 MG capsule  Commonly known as:  PRILOSEC  Take 1 capsule (40 mg total) by mouth daily.     oxyCODONE 5 MG immediate release tablet  Commonly known as:  Oxy IR/ROXICODONE  Take 1-2 tablets (5-10 mg total) by mouth every 4 (four) hours as needed for moderate pain, severe pain or breakthrough pain.     sucralfate 1 g tablet  Commonly known as:  CARAFATE  Take 1 tablet (1 g total) by mouth 4 (four) times daily -  with meals and at bedtime.     vitamin C 1000 MG tablet  Take 1,000 mg by mouth daily.           Follow-up Information    Follow up with GROSS,STEVEN C., MD. Schedule an appointment as soon as possible for a visit in 3 weeks.   Specialty:  General Surgery   Why:  To follow up after your hospital stay, To follow up after your operation   Contact information:   [place] [address] [phone]        Signed: Morton Peters, M.D., F.A.C.S. Gastrointestinal and Minimally Invasive Surgery [facility] [address], [place],  [address] [phone] Main / Paging   10/20/2015, 11:33 AM

## 2015-10-20 NOTE — Progress Notes (Signed)
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   S/P colon resection   2 Days Post-Op  10/18/2015  POST-OPERATIVE DIAGNOSIS: Rectosigmoid colon cancer with urethral stricture  PROCEDURE:  XI ROBOT ASSISTED LOW ANTERIOR RESECTION RIGID PROCTOSCOPY WITH TATTOOING    SURGEON:   Michael Boston, MD  ASSISTANT:   Stark Klein, MD Jackolyn Confer, MD   Recovering  Plan:  -adv diet per protocol -DM control -BP control - Coreg, lisinopril, HCTZ -GERD control -VTE prophylaxis- SCDs, etc -mobilize as tolerated to help recovery -f/u pathology  D/C patient from hospital when patient meets criteria (anticipate in 2-3 day(s)):  Tolerating oral intake well Ambulating well Adequate pain control without IV medications Urinating  Having flatus Disposition planning in place  I updated the patient's status to the patient & the patient's nurse.  Recommendations were made.  Questions were answered.    They expressed understanding & appreciation.     [name], M.D., F.A.C.S. Gastrointestinal and Minimally Invasive Surgery [facility] [address] [place], [place] [address] [phone] Main / Paging   10/20/2015  Subjective:  Sore - Dilaudid helps Walked more in hallways Tol liquids - tried  solids but felt a little bloated  Objective:  Vital signs:  Filed Vitals:   10/19/15 2133 10/20/15 0138 10/20/15 0439 10/20/15 0455  BP: 112/62 116/81  129/75  Pulse: 96 104  111  Temp: 98.4 F (36.9 C) 98.7 F (37.1 C)  98.4 F (36.9 C)  TempSrc: Oral Oral  Oral  Resp: _0 Height:      Weight:   185.748 kg (409 lb 8 oz)   SpO2: 95% 93%  98%       Intake/Output   Yesterday:  04/06 0701 - 04/07 0700 In: -  Out: 3150 [Urine:3150] This shift:  Total I/O In: -  Out: 2150 [Urine:2150]  Bowel function:  Flatus: YES  BM: n  Drain: n/a  Physical Exam:  General: Pt awake/alert/oriented x4 in no acute distress Eyes: PERRL, normal EOM.  Sclera clear.  No icterus Neuro: CN II-XII intact w/o focal sensory/motor deficits. Lymph: No head/neck/groin lymphadenopathy Psych:  No delerium/psychosis/paranoia.  Less anxious. HENT: Normocephalic, Mucus membranes moist.  No thrush Neck: Supple, No tracheal deviation Chest: No chest wall pain w good excursion CV:  Pulses intact.  Regular rhythm MS: Normal AROM mjr joints.  No obvious deformity Abdomen: Soft.  Morbidly obese.  Nondistended.  Mildly tender at incisions only.  No evidence of peritonitis.  No incarcerated hernias. Ext:  SCDs BLE.  No mjr edema.  No cyanosis Skin: No petechiae / purpura  Results:   Labs: Results for orders placed or performed during the hospital encounter of 10/18/15 (from the past 48 hour(s))  Hemoglobin A1c     Status: Abnormal   Collection Time: 10/18/15  7:25 AM  Result Value Ref Range  Hgb A1c MFr Bld 7.2 (H) 4.8 - 5.6 %    Comment: (NOTE)         Pre-diabetes: 5.7 - 6.4         Diabetes: >6.4         Glycemic control for adults with diabetes: <7.0    Mean Plasma Glucose 160 mg/dL    Comment: (NOTE) Performed At: [facility] [place], [place] [id number] Lindon Romp MD [id number]   Glucose, capillary     Status: Abnormal   Collection Time:  10/18/15  1:43 PM  Result Value Ref Range   Glucose-Capillary 199 (H) 65 - 99 mg/dL   Comment 1 Notify RN    Comment 2 Document in Chart   Glucose, capillary     Status: Abnormal   Collection Time: 10/18/15  2:51 PM  Result Value Ref Range   Glucose-Capillary 234 (H) 65 - 99 mg/dL   Comment 1 Notify RN    Comment 2 Document in Chart   Glucose, capillary     Status: Abnormal   Collection Time: 10/18/15  3:30 PM  Result Value Ref Range   Glucose-Capillary 218 (H) 65 - 99 mg/dL  CBC     Status: Abnormal   Collection Time: 10/18/15  4:50 PM  Result Value Ref Range   WBC 15.8 (H) 4.0 - 10.5 K/uL   RBC 4.54 4.22 - 5.81 MIL/uL   Hemoglobin 14.6 13.0 - 17.0 g/dL   HCT 41.6 39.0 - 52.0 %   MCV 91.6 78.0 - 100.0 fL   MCH 32.2 26.0 - 34.0 pg   MCHC 35.1 30.0 - 36.0 g/dL   RDW 12.6 11.5 - 15.5 %   Platelets 270 150 - 400 K/uL  Creatinine, serum     Status: None   Collection Time: 10/18/15  4:50 PM  Result Value Ref Range   Creatinine, Ser 0.93 0.61 - 1.24 mg/dL   GFR calc non Af Amer >60 >60 mL/min   GFR calc Af Amer >60 >60 mL/min    Comment: (NOTE) The eGFR has been calculated using the CKD EPI equation. This calculation has not been validated in all clinical situations. eGFR's persistently <60 mL/min signify possible Chronic Kidney Disease.   Glucose, capillary     Status: Abnormal   Collection Time: 10/18/15  6:29 PM  Result Value Ref Range   Glucose-Capillary 209 (H) 65 - 99 mg/dL  Glucose, capillary     Status: Abnormal   Collection Time: 10/18/15  9:54 PM  Result Value Ref Range   Glucose-Capillary 205 (H) 65 - 99 mg/dL  Basic metabolic panel     Status: Abnormal   Collection Time: 10/19/15  4:18 AM  Result Value Ref Range   Sodium 136 135 - 145 mmol/L   Potassium 4.5 3.5 - 5.1 mmol/L   Chloride 102 101 - 111 mmol/L   CO2 27 22 - 32 mmol/L   Glucose, Bld 214 (H) 65 - 99 mg/dL   BUN 9 6 - 20 mg/dL   Creatinine, Ser 0.86 0.61 - 1.24 mg/dL   Calcium 8.1 (L) 8.9 - 10.3  mg/dL   GFR calc non Af Amer >60 >60 mL/min   GFR calc Af Amer >60 >60 mL/min    Comment: (NOTE) The eGFR has been calculated using the CKD EPI equation. This calculation has not been validated in all clinical situations. eGFR's persistently <60 mL/min signify possible Chronic Kidney Disease.    Anion gap 7 5 -  15  CBC     Status: Abnormal   Collection Time: 10/19/15  4:18 AM  Result Value Ref Range   WBC 12.7 (H) 4.0 - 10.5 K/uL    Comment: WHITE COUNT CONFIRMED ON SMEAR   RBC 3.92 (L) 4.22 - 5.81 MIL/uL   Hemoglobin 12.5 (L) 13.0 - 17.0 g/dL   HCT 36.3 (L) 39.0 - 52.0 %   MCV 92.6 78.0 - 100.0 fL   MCH 31.9 26.0 - 34.0 pg   MCHC 34.4 30.0 - 36.0 g/dL   RDW 12.8 11.5 - 15.5 %   Platelets 283 150 - 400 K/uL  Magnesium     Status: Abnormal   Collection Time: 10/19/15  4:18 AM  Result Value Ref Range   Magnesium 1.6 (L) 1.7 - 2.4 mg/dL  Glucose, capillary     Status: Abnormal   Collection Time: 10/19/15  7:53 AM  Result Value Ref Range   Glucose-Capillary 218 (H) 65 - 99 mg/dL  Glucose, capillary     Status: Abnormal   Collection Time: 10/19/15 11:54 AM  Result Value Ref Range   Glucose-Capillary 213 (H) 65 - 99 mg/dL  Glucose, capillary     Status: Abnormal   Collection Time: 10/19/15  4:51 PM  Result Value Ref Range   Glucose-Capillary 156 (H) 65 - 99 mg/dL  Glucose, capillary     Status: Abnormal   Collection Time: 10/19/15  9:27 PM  Result Value Ref Range   Glucose-Capillary 150 (H) 65 - 99 mg/dL    Imaging / Studies: No results found.  Medications / Allergies: per chart  Antibiotics: Anti-infectives    Start     Dose/Rate Route Frequency Ordered Stop   10/18/15 2200  cefoTEtan (CEFOTAN) 2 g in dextrose 5 % 50 mL IVPB     2 g 100 mL/hr over 30 Minutes Intravenous Every 12 hours 10/18/15 1620 10/18/15 2238   10/18/15 1630  metroNIDAZOLE (FLAGYL) tablet 1,000 mg  Status:  Discontinued     1,000 mg Oral 3 times daily 10/18/15 1620 10/18/15 1755   10/18/15 1630   neomycin (MYCIFRADIN) tablet 1,000 mg  Status:  Discontinued     1,000 mg Oral 3 times daily 10/18/15 1620 10/18/15 1755   10/18/15 0815  clindamycin (CLEOCIN) 900 mg, gentamicin (GARAMYCIN) 240 mg in sodium chloride 0.9 % 1,000 mL for intraperitoneal lavage      Intraperitoneal To Surgery 10/18/15 0808 10/18/15 1301   10/18/15 0711  cefoTEtan (CEFOTAN) 2 g in dextrose 5 % 50 mL IVPB     2 g 100 mL/hr over 30 Minutes Intravenous On call to O.R. 10/18/15 [id number] 10/18/15 0925        Note: Portions of this report may have been transcribed using voice recognition software. Every effort was made to ensure accuracy; however, inadvertent computerized transcription errors may be present.   Any transcriptional errors that result from this process are unintentional.     [name], M.D., F.A.C.S. Gastrointestinal and Minimally Invasive Surgery [facility] [address], [place], [place] [address] [phone] Main / Paging   10/20/2015  CARE TEAM:  PCP: Delman Cheadle, MD  Outpatient Care Team: Patient Care Team: Shawnee Knapp, MD as PCP - General (Family Medicine) Michael Boston, MD as Consulting Physician (General Surgery) Fay Records, MD as Consulting Physician (Cardiology) Gatha Mayer, MD as Consulting Physician (Gastroenterology) Alexis Frock, MD as Consulting Physician (Urology)  Inpatient Treatment Team: Treatment Team: Attending Provider: Remo Lipps  Gross, MD; Consulting Physician: Alexis Frock, MD; Technician: Coralie Carpen, NT; Registered Nurse: Alphonzo Dublin, RN; Registered Nurse: Mortimer Fries, RN; Registered Nurse: Janifer Adie, RN

## 2015-10-21 LAB — POCT I-STAT 7, (LYTES, BLD GAS, ICA,H+H)
ACID-BASE DEFICIT: 2 mmol/L (ref 0.0–2.0)
ACID-BASE DEFICIT: 2 mmol/L (ref 0.0–2.0)
Acid-base deficit: 2 mmol/L (ref 0.0–2.0)
BICARBONATE: 24.8 meq/L — AB (ref 20.0–24.0)
Bicarbonate: 24.1 mEq/L — ABNORMAL HIGH (ref 20.0–24.0)
Bicarbonate: 25.2 mEq/L — ABNORMAL HIGH (ref 20.0–24.0)
CALCIUM ION: 1.07 mmol/L — AB (ref 1.12–1.23)
Calcium, Ion: 1.13 mmol/L (ref 1.12–1.23)
Calcium, Ion: 1.07 mmol/L — ABNORMAL LOW (ref 1.12–1.23)
HCT: 44 % (ref 39.0–52.0)
HEMATOCRIT: 39 % (ref 39.0–52.0)
HEMATOCRIT: 44 % (ref 39.0–52.0)
HEMOGLOBIN: 13.3 g/dL (ref 13.0–17.0)
HEMOGLOBIN: 15 g/dL (ref 13.0–17.0)
Hemoglobin: 15 g/dL (ref 13.0–17.0)
O2 SAT: 95 %
O2 SAT: 96 %
O2 Saturation: 97 %
PCO2 ART: 42.4 mmHg (ref 35.0–45.0)
PH ART: 7.322 — AB (ref 7.350–7.450)
PO2 ART: 80 mmHg (ref 80.0–100.0)
POTASSIUM: 3.3 mmol/L — AB (ref 3.5–5.1)
POTASSIUM: 4 mmol/L (ref 3.5–5.1)
Patient temperature: 36.1
Patient temperature: 36.1
Patient temperature: 36.1
Potassium: 4.2 mmol/L (ref 3.5–5.1)
Sodium: 135 mmol/L (ref 135–145)
Sodium: 135 mmol/L (ref 135–145)
Sodium: 135 mmol/L (ref 135–145)
TCO2: 25 mmol/L (ref 0–100)
TCO2: 26 mmol/L (ref 0–100)
TCO2: 27 mmol/L (ref 0–100)
pCO2 arterial: 47.3 mmHg — ABNORMAL HIGH (ref 35.0–45.0)
pCO2 arterial: 51 mmHg — ABNORMAL HIGH (ref 35.0–45.0)
pH, Arterial: 7.358 (ref 7.350–7.450)
pH, Arterial: 7.296 — ABNORMAL LOW (ref 7.350–7.450)
pO2, Arterial: 88 mmHg (ref 80.0–100.0)
pO2, Arterial: 90 mmHg (ref 80.0–100.0)

## 2015-10-21 LAB — GLUCOSE, CAPILLARY
GLUCOSE-CAPILLARY: 163 mg/dL — AB (ref 65–99)
GLUCOSE-CAPILLARY: 134 mg/dL — AB (ref 65–99)
Glucose-Capillary: 189 mg/dL — ABNORMAL HIGH (ref 65–99)
Glucose-Capillary: 123 mg/dL — ABNORMAL HIGH (ref 65–99)

## 2015-10-21 NOTE — Progress Notes (Signed)
Patient ID: Chad [name], male   DOB: 1971-05-05, 45 y.o.   MRN: [id number] [facility] Surgery Progress Note:   3 Days Post-Op  Subjective: Mental status is clear.  No complaints Objective: Vital signs in last 24 hours: Temp:  [98.6 F (37 C)-99.1 F (37.3 C)] 98.6 F (37 C) (04/08 0551) Pulse Rate:  [92-100] 96 (04/08 0551) Resp:  [17-18] 18 (04/08 0551) BP: (104-116)/(54-60) 110/54 mmHg (04/08 0551) SpO2:  [96 %-100 %] 98 % (04/08 0551) Weight:  [id number] kg (405 lb 14.4 oz)] 184.115 kg (405 lb 14.4 oz) (04/08 0551)  Intake/Output from previous day: 04/07 0701 - 04/08 0700 In: 1440 [P.O.:1440] Out: [id number] [Urine:6830] Intake/Output this shift:    Physical Exam: Work of breathing is not labored.  Big guy.  Passing flatus.  Foley in place to stent stricture and removal per urology.    Lab Results:  Results for orders placed or performed during the hospital encounter of 10/18/15 (from the past 48 hour(s))  Glucose, capillary     Status: Abnormal   Collection Time: 10/19/15 11:54 AM  Result Value Ref Range   Glucose-Capillary 213 (H) 65 - 99 mg/dL  Glucose, capillary     Status: Abnormal   Collection Time: 10/19/15  4:51 PM  Result Value Ref Range   Glucose-Capillary 156 (H) 65 - 99 mg/dL  Glucose, capillary     Status: Abnormal   Collection Time: 10/19/15  9:27 PM  Result Value Ref Range   Glucose-Capillary 150 (H) 65 - 99 mg/dL  Glucose, capillary     Status: Abnormal   Collection Time: 10/20/15  8:01 AM  Result Value Ref Range   Glucose-Capillary 181 (H) 65 - 99 mg/dL  Glucose, capillary     Status: Abnormal   Collection Time: 10/20/15  1:01 PM  Result Value Ref Range   Glucose-Capillary 173 (H) 65 - 99 mg/dL  Glucose, capillary     Status: Abnormal   Collection Time: 10/20/15  4:36 PM  Result Value Ref Range   Glucose-Capillary 128 (H) 65 - 99 mg/dL  Glucose, capillary     Status: Abnormal   Collection Time: 10/20/15  9:09 PM  Result Value Ref Range   Glucose-Capillary 138 (H) 65 - 99 mg/dL  Glucose, capillary     Status: Abnormal   Collection Time: 10/21/15  7:19 AM  Result Value Ref Range   Glucose-Capillary 189 (H) 65 - 99 mg/dL    Radiology/Results: No results found.  Anti-infectives: Anti-infectives    Start     Dose/Rate Route Frequency Ordered Stop   10/18/15 2200  cefoTEtan (CEFOTAN) 2 g in dextrose 5 % 50 mL IVPB     2 g 100 mL/hr over 30 Minutes Intravenous Every 12 hours 10/18/15 1620 10/18/15 2238   10/18/15 1630  metroNIDAZOLE (FLAGYL) tablet 1,000 mg  Status:  Discontinued     1,000 mg Oral 3 times daily 10/18/15 1620 10/18/15 1755   10/18/15 1630  neomycin (MYCIFRADIN) tablet 1,000 mg  Status:  Discontinued     1,000 mg Oral 3 times daily 10/18/15 1620 10/18/15 1755   10/18/15 0815  clindamycin (CLEOCIN) 900 mg, gentamicin (GARAMYCIN) 240 mg in sodium chloride 0.9 % 1,000 mL for intraperitoneal lavage      Intraperitoneal To Surgery 10/18/15 0808 10/18/15 1301   10/18/15 0711  cefoTEtan (CEFOTAN) 2 g in dextrose 5 % 50 mL IVPB     2 g 100 mL/hr over 30 Minutes Intravenous On call to O.R. 10/18/15  [id number] 10/18/15 0925      Assessment/Plan: Problem List: Patient Active Problem List   Diagnosis Date Noted  . 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 S/P colon resection 10/18/2015  . 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 Benign neoplasm of descending colon   . Benign neoplasm of rectum   . Benign neoplasm of rectosigmoid junction   . Gastroesophageal reflux disease without esophagitis 06/15/2015  . Chronic systolic HF (heart failure) ([place]) 04/06/2015  . Coronary artery calcification 04/06/2015  . Calculus of gallbladder without cholecystitis without obstruction 01/14/2015  . Hepatic steatosis 01/14/2015  . Neuropathy due to type 2 diabetes mellitus ([place]) 01/14/2015  . Polypharmacy 01/14/2015  . Alcohol use ([place]) 01/14/2015  . Type 2 diabetes  mellitus with hyperglycemia ([place]) 01/06/2015  . Morbid obesity ([place]) 01/06/2015  . Essential hypertension, benign 01/06/2015    Post LAR.  Good postop progress 3 Days Post-Op    LOS: 3 days   Matt B. Hassell Done, MD, [facility] [phone] beeper [phone]  10/21/2015 9:21 AM

## 2015-10-22 LAB — GLUCOSE, CAPILLARY
GLUCOSE-CAPILLARY: 189 mg/dL — AB (ref 65–99)
GLUCOSE-CAPILLARY: 192 mg/dL — AB (ref 65–99)
GLUCOSE-CAPILLARY: 136 mg/dL — AB (ref 65–99)
GLUCOSE-CAPILLARY: 159 mg/dL — AB (ref 65–99)

## 2015-10-22 MED ORDER — OXYCODONE HCL 5 MG PO TABS
7.5000 mg | ORAL_TABLET | ORAL | Status: DC | PRN
  Administered 2015-10-22 (×2): 7.5 mg via ORAL
  Filled 2015-10-22 (×2): qty 2

## 2015-10-22 MED ORDER — OXYCODONE-ACETAMINOPHEN 5-325 MG PO TABS
1.0000 | ORAL_TABLET | ORAL | Status: DC | PRN
  Administered 2015-10-22: 1 via ORAL
  Filled 2015-10-22: qty 1

## 2015-10-22 NOTE — Progress Notes (Signed)
Patient ID: Chad [name], male   DOB: Oct 18, 1970, 45 y.o.   MRN: [id number] [facility] Progress Note:   4 Days Post-Op  Subjective: Mental status is clear.  Wants to stop IV pain meds and convert to oral.  Ordered. Objective: Vital signs in last 24 hours: Temp:  [98 F (36.7 C)-99 F (37.2 C)] 98 F (36.7 C) (04/09 0518) Pulse Rate:  [86-108] 86 (04/09 0518) Resp:  [18] 18 (04/09 0518) BP: (120-144)/(51-77) 144/69 mmHg (04/09 0518) SpO2:  [97 %-100 %] 97 % (04/09 0518) Weight:  [190.511 kg (420 lb)] 190.511 kg (420 lb) (04/09 0518)  Intake/Output from previous day: 04/08 0701 - 04/09 0700 In: 480 [P.O.:480] Out: 4700 [Urine:4700] Intake/Output this shift:    Physical Exam: Work of breathing is normal.  Foley in place as per urology  Lab Results:  Results for orders placed or performed during the hospital encounter of 10/18/15 (from the past 48 hour(s))  Glucose, capillary     Status: Abnormal   Collection Time: 10/20/15  1:01 PM  Result Value Ref Range   Glucose-Capillary 173 (H) 65 - 99 mg/dL  Glucose, capillary     Status: Abnormal   Collection Time: 10/20/15  4:36 PM  Result Value Ref Range   Glucose-Capillary 128 (H) 65 - 99 mg/dL  Glucose, capillary     Status: Abnormal   Collection Time: 10/20/15  9:09 PM  Result Value Ref Range   Glucose-Capillary 138 (H) 65 - 99 mg/dL  Glucose, capillary     Status: Abnormal   Collection Time: 10/21/15  7:19 AM  Result Value Ref Range   Glucose-Capillary 189 (H) 65 - 99 mg/dL  Glucose, capillary     Status: Abnormal   Collection Time: 10/21/15 11:49 AM  Result Value Ref Range   Glucose-Capillary 163 (H) 65 - 99 mg/dL  Glucose, capillary     Status: Abnormal   Collection Time: 10/21/15  4:54 PM  Result Value Ref Range   Glucose-Capillary 123 (H) 65 - 99 mg/dL  Glucose, capillary     Status: Abnormal   Collection Time: 10/21/15 10:14 PM  Result Value Ref Range   Glucose-Capillary 134 (H) 65 - 99 mg/dL   Glucose, capillary     Status: Abnormal   Collection Time: 10/22/15  7:52 AM  Result Value Ref Range   Glucose-Capillary 189 (H) 65 - 99 mg/dL    Radiology/Results: No results found.  Anti-infectives: Anti-infectives    Start     Dose/Rate Route Frequency Ordered Stop   10/18/15 2200  cefoTEtan (CEFOTAN) 2 g in dextrose 5 % 50 mL IVPB     2 g 100 mL/hr over 30 Minutes Intravenous Every 12 hours 10/18/15 1620 10/18/15 2238   10/18/15 1630  metroNIDAZOLE (FLAGYL) tablet 1,000 mg  Status:  Discontinued     1,000 mg Oral 3 times daily 10/18/15 1620 10/18/15 1755   10/18/15 1630  neomycin (MYCIFRADIN) tablet 1,000 mg  Status:  Discontinued     1,000 mg Oral 3 times daily 10/18/15 1620 10/18/15 1755   10/18/15 0815  clindamycin (CLEOCIN) 900 mg, gentamicin (GARAMYCIN) 240 mg in sodium chloride 0.9 % 1,000 mL for intraperitoneal lavage      Intraperitoneal To Surgery 10/18/15 0808 10/18/15 1301   10/18/15 0711  cefoTEtan (CEFOTAN) 2 g in dextrose 5 % 50 mL IVPB     2 g 100 mL/hr over 30 Minutes Intravenous On call to O.R. 10/18/15 [id number] 10/18/15 [id number]  Assessment/Plan: Problem List: Patient Active Problem List   Diagnosis Date Noted  . 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 S/P colon resection 10/18/2015  . 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 Benign neoplasm of descending colon   . Benign neoplasm of rectum   . Benign neoplasm of rectosigmoid junction   . Gastroesophageal reflux disease without esophagitis 06/15/2015  . Chronic systolic HF (heart failure) ([place]) 04/06/2015  . Coronary artery calcification 04/06/2015  . Calculus of gallbladder without cholecystitis without obstruction 01/14/2015  . Hepatic steatosis 01/14/2015  . Neuropathy due to type 2 diabetes mellitus ([place]) 01/14/2015  . Polypharmacy 01/14/2015  . Alcohol use ([place]) 01/14/2015  . Type 2 diabetes mellitus with hyperglycemia ([place])  01/06/2015  . Morbid obesity ([place]) 01/06/2015  . Essential hypertension, benign 01/06/2015    Hopeful discharge tomorrow. 4 Days Post-Op    LOS: 4 days   Matt B. Hassell Done, MD, [facility] [phone] beeper [phone]  10/22/2015 9:57 AM

## 2015-10-22 NOTE — Progress Notes (Signed)
Pt given standing order tylenol and desires PRN percocet q4 as ordered. Paged Dr Hassell Done concerning over limit of tylenol. Orders noted.

## 2015-10-23 LAB — GLUCOSE, CAPILLARY: GLUCOSE-CAPILLARY: 181 mg/dL — AB (ref 65–99)

## 2015-10-26 ENCOUNTER — Encounter (HOSPITAL_COMMUNITY): Payer: Self-pay

## 2015-10-26 NOTE — Discharge Summary (Signed)
Stark Klein, MD Physician Deleted Surgery Discharge Summaries 10/20/2015 11:33 AM    Expand All Collapse All    Physician Discharge Summary  Patient ID: Chad [name] MRN: [id number] DOB/AGE: 1970/10/10 45 y.o.  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 Active Problems:  Type 2 diabetes mellitus with hyperglycemia ([place])  Morbid obesity ([place])  Essential hypertension, benign  Neuropathy due to type 2 diabetes mellitus ([place])  Chronic systolic HF (heart failure) ([place])  Gastroesophageal reflux disease without esophagitis  Benign neoplasm of rectosigmoid junction  Membranous urethral stricture s/p balloon dilitatiopn 10/18/2015  S/P colon resection   Discharge Diagnoses:  Principal Problem:  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 Active Problems:  Type 2 diabetes mellitus with hyperglycemia ([place])  Morbid obesity ([place])  Essential hypertension, benign  Neuropathy due to type 2 diabetes mellitus ([facility])  Chronic systolic HF (heart failure) ([facility])  Gastroesophageal reflux disease without esophagitis  Benign neoplasm of rectosigmoid junction  Membranous urethral stricture s/p balloon dilitatiopn 10/18/2015  S/P colon resection   POST-OPERATIVE DIAGNOSIS:   Sigmoid colon cancer with urethral stricture  SURGERY: 10/18/2015  Procedure(s): XI ROBOT ASSISTED LOW ANTERIOR RESECTION WITH RIGID PROCTOSCOPY WITH TATTOOING  CYSTOSCOPY FLEXIBLE URETHRAL DIALATION AND FOLEY PLACEMENT  SURGEON:   Surgeon(s): Stark Klein,  MD Alexis Frock, MD Jackolyn Confer, MD Michael Boston, MD  Consults: None  Hospital Course:   The patient underwent the surgery above. Postoperatively, the patient gradually mobilized and advanced to a solid diet. Pain and other symptoms were treated aggressively.   By the time of discharge, the patient was walking well the hallways, eating food, having flatus. Pain was well-controlled on an oral medications. Based on meeting discharge criteria and continuing to recover, I felt it was safe for the patient to be discharged from the hospital to further recover with close followup. Postoperative recommendations were discussed in detail. They are written as well.   Significant Diagnostic Studies:   Lab Results Past 72 Hours    Results for orders placed or performed during the hospital encounter of 10/18/15 (from the past 72 hour(s))  Glucose, capillary Status: Abnormal   Collection Time: 10/18/15 6:31 AM  Result Value Ref Range   Glucose-Capillary 186 (H) 65 - 99 mg/dL   Comment 1 Notify RN   Hemoglobin A1c Status: Abnormal   Collection Time: 10/18/15 7:25 AM  Result Value Ref Range   Hgb A1c MFr Bld 7.2 (H) 4.8 - 5.6 %    Comment: (NOTE)  Pre-diabetes: 5.7 - 6.4  Diabetes: >6.4  Glycemic control for adults with diabetes: <7.0    Mean Plasma Glucose 160 mg/dL    Comment: (NOTE) Performed At: [facility] [address] [place], [place] [id number] Lindon Romp MD NO:[id number]   Glucose, capillary Status: Abnormal   Collection Time: 10/18/15 1:43 PM  Result Value Ref Range   Glucose-Capillary 199 (H) 65 - 99 mg/dL   Comment 1 Notify RN    Comment 2 Document in Chart   Glucose, capillary Status: Abnormal   Collection Time: 10/18/15 2:51 PM  Result Value Ref Range   Glucose-Capillary 234 (H) 65 - 99 mg/dL  Comment 1 Notify RN    Comment 2  Document in Chart   Glucose, capillary Status: Abnormal   Collection Time: 10/18/15 3:30 PM  Result Value Ref Range   Glucose-Capillary 218 (H) 65 - 99 mg/dL  CBC Status: Abnormal   Collection Time: 10/18/15 4:50 PM  Result Value Ref Range   WBC 15.8 (H) 4.0 - 10.5 K/uL   RBC 4.54 4.22 - 5.81 MIL/uL   Hemoglobin 14.6 13.0 - 17.0 g/dL   HCT 41.6 39.0 - 52.0 %   MCV 91.6 78.0 - 100.0 fL   MCH 32.2 26.0 - 34.0 pg   MCHC 35.1 30.0 - 36.0 g/dL   RDW 12.6 11.5 - 15.5 %   Platelets 270 150 - 400 K/uL  Creatinine, serum Status: None   Collection Time: 10/18/15 4:50 PM  Result Value Ref Range   Creatinine, Ser 0.93 0.61 - 1.24 mg/dL   GFR calc non Af Amer >60 >60 mL/min   GFR calc Af Amer >60 >60 mL/min    Comment: (NOTE) The eGFR has been calculated using the CKD EPI equation. This calculation has not been validated in all clinical situations. eGFR's persistently <60 mL/min signify possible Chronic Kidney Disease.   Glucose, capillary Status: Abnormal   Collection Time: 10/18/15 6:29 PM  Result Value Ref Range   Glucose-Capillary 209 (H) 65 - 99 mg/dL  Glucose, capillary Status: Abnormal   Collection Time: 10/18/15 9:54 PM  Result Value Ref Range   Glucose-Capillary 205 (H) 65 - 99 mg/dL  Basic metabolic panel Status: Abnormal   Collection Time: 10/19/15 4:18 AM  Result Value Ref Range   Sodium 136 135 - 145 mmol/L   Potassium 4.5 3.5 - 5.1 mmol/L   Chloride 102 101 - 111 mmol/L   CO2 27 22 - 32 mmol/L   Glucose, Bld 214 (H) 65 - 99 mg/dL   BUN 9 6 - 20 mg/dL   Creatinine, Ser 0.86 0.61 - 1.24 mg/dL   Calcium 8.1 (L) 8.9 - 10.3 mg/dL   GFR calc non Af Amer >60 >60 mL/min   GFR calc Af Amer >60 >60 mL/min    Comment: (NOTE) The eGFR has been calculated using the CKD EPI equation. This calculation has  not been validated in all clinical situations. eGFR's persistently <60 mL/min signify possible Chronic Kidney Disease.    Anion gap 7 5 - 15  CBC Status: Abnormal   Collection Time: 10/19/15 4:18 AM  Result Value Ref Range   WBC 12.7 (H) 4.0 - 10.5 K/uL    Comment: WHITE COUNT CONFIRMED ON SMEAR   RBC 3.92 (L) 4.22 - 5.81 MIL/uL   Hemoglobin 12.5 (L) 13.0 - 17.0 g/dL   HCT 36.3 (L) 39.0 - 52.0 %   MCV 92.6 78.0 - 100.0 fL   MCH 31.9 26.0 - 34.0 pg   MCHC 34.4 30.0 - 36.0 g/dL   RDW 12.8 11.5 - 15.5 %   Platelets 283 150 - 400 K/uL  Magnesium Status: Abnormal   Collection Time: 10/19/15 4:18 AM  Result Value Ref Range   Magnesium 1.6 (L) 1.7 - 2.4 mg/dL  Glucose, capillary Status: Abnormal   Collection Time: 10/19/15 7:53 AM  Result Value Ref Range   Glucose-Capillary 218 (H) 65 - 99 mg/dL  Glucose, capillary Status: Abnormal   Collection Time: 10/19/15 11:54 AM  Result Value Ref Range   Glucose-Capillary 213 (H) 65 - 99 mg/dL  Glucose, capillary Status: Abnormal   Collection Time: 10/19/15 4:51 PM  Result Value Ref Range   Glucose-Capillary 156 (H) 65 - 99 mg/dL  Glucose, capillary Status: Abnormal   Collection Time: 10/19/15 9:27 PM  Result Value Ref Range   Glucose-Capillary 150 (H) 65 - 99 mg/dL  Glucose, capillary Status: Abnormal   Collection Time: 10/20/15 8:01 AM  Result Value Ref Range   Glucose-Capillary 181 (H) 65 - 99 mg/dL      No results found.  Discharge Exam: Blood pressure 129/75, pulse 111, temperature 98.4 F (36.9 C), temperature source Oral, resp. rate 18, height _0  (1.905 m), weight 185.748 kg (409 lb 8 oz), SpO2 98 %.  General: Pt awake/alert/oriented x4 in no major acute distress Eyes: PERRL, normal EOM. Sclera nonicteric Neuro: CN II-XII intact w/o focal sensory/motor deficits. Lymph: No  head/neck/groin lymphadenopathy Psych: No delerium/psychosis/paranoia HENT: Normocephalic, Mucus membranes moist. No thrush Neck: Supple, No tracheal deviation Chest: No pain. Good respiratory excursion. CV: Pulses intact. Regular rhythm MS: Normal AROM mjr joints. No obvious deformity Abdomen: Soft, Nondistended. Min tender at incisions only. No incarcerated hernias. Ext: SCDs BLE. No significant edema. No cyanosis Skin: No petechiae / purpura  Discharged Condition: good   Past Medical History  Diagnosis Date  . GERD (gastroesophageal reflux disease)   . Hypertension   . Diabetes mellitus without complication ([place]) 08/1306    oral agents only (07/2015)  . Gallstone 12/2014    GB stones and sludge on ultrasound and CT,  . Morbid obesity with BMI of 50.0-59.9, adult ([place]) 03/2013  . Fatty liver 12/2014    on ultrasound. pt obese and abuses ETOH  . ETOH abuse 2014  . Rectal bleeding 05/2015    07/2015 colonoscopy with malignant appearing sigmoid mass. Additional rectal, descending, polyps  . Coronary artery disease   . Shoulder pain, bilateral     due to previous injury  . Cancer [facility])     colon cancer    Past Surgical History  Procedure Laterality Date  . Cardiac catheterization N/A 04/06/2015    Procedure: Left Heart Cath and Coronary Angiography; Surgeon: Belva Crome, MD; Location: [facility]; Service: Cardiovascular; Laterality: N/A;  . Colonoscopy N/A 07/25/2015    Procedure: COLONOSCOPY; Surgeon: Gatha Mayer, MD; Location: [facility] ENDOSCOPY; Service: Gastroenterology; Laterality: N/A;  . Esophagogastroduodenoscopy N/A 07/25/2015    Procedure: ESOPHAGOGASTRODUODENOSCOPY (EGD); Surgeon: Gatha Mayer, MD; Location: [facility]; Service: Gastroenterology; Laterality: N/A;  . Cystoscopy  10/18/2015    Procedure: CYSTOSCOPY FLEXIBLE URETHRAL DIALATION AND FOLEY  PLACEMENT; Surgeon: Alexis Frock, MD; Location: [facility] ORS; Service: Urology;;    Social History   Social History  . Marital Status: Legally Separated    Spouse Name: N/A  . Number of Children: N/A  . Years of Education: N/A   Occupational History  . Not on file.   Social History Main Topics  . Smoking status: Never Smoker   . Smokeless tobacco: Current User     Comment: occasionally dips tobacco  . Alcohol Use: Yes     Comment: 2-3 drinks of liqour a night, has been cutting back.   . Drug Use: No  . Sexual Activity: Not on file   Other Topics Concern  . Not on file   Social History Narrative    Family History  Problem Relation Age of Onset  . Diabetes Father     Current Facility-Administered Medications  Medication Dose Route Frequency Provider Last Rate Last Dose  . 0.9 % sodium chloride infusion 250 mL Intravenous PRN Michael Boston, MD    .  acetaminophen (TYLENOL) tablet 1,000 mg 1,000 mg Oral TID Michael Boston, MD  1,000 mg at 10/20/15 0957  . alum & mag hydroxide-simeth (MAALOX/MYLANTA) 200-200-20 MG/5ML suspension 30 mL 30 mL Oral Q6H PRN Michael Boston, MD    . alvimopan (ENTEREG) capsule 12 mg 12 mg Oral BID Michael Boston, MD  12 mg at 10/19/15 2157  . aspirin EC tablet 81 mg 81 mg Oral Daily Michael Boston, MD  81 mg at 10/20/15 0959  . carvedilol (COREG) tablet 6.25 mg 6.25 mg Oral BID WC Michael Boston, MD  6.25 mg at 10/20/15 0824  . diphenhydrAMINE (BENADRYL) 12.5 MG/5ML elixir 12.5 mg 12.5 mg Oral Q6H PRN Michael Boston, MD     Or  . diphenhydrAMINE (BENADRYL) injection 12.5 mg 12.5 mg Intravenous Q6H PRN Michael Boston, MD    . enoxaparin (LOVENOX) injection 40 mg 40 mg Subcutaneous Q24H Michael Boston, MD  40 mg at 10/20/15 0825  . glipiZIDE (GLUCOTROL XL) 24 hr tablet 5 mg 5 mg Oral Q  breakfast Michael Boston, MD  5 mg at 10/20/15 0824  . lisinopril (PRINIVIL,ZESTRIL) tablet 20 mg 20 mg Oral Daily Michael Boston, MD  20 mg at 10/20/15 0959   And  . hydrochlorothiazide (HYDRODIURIL) tablet 25 mg 25 mg Oral Daily Michael Boston, MD  25 mg at 10/20/15 0958  . HYDROmorphone (DILAUDID) injection 1-2 mg 1-2 mg Intravenous Q1H PRN Michael Boston, MD  1 mg at 10/20/15 0957  . insulin aspart (novoLOG) injection 0-15 Units 0-15 Units Subcutaneous TID WC Michael Boston, MD  3 Units at 10/20/15 0825  . insulin aspart (novoLOG) injection 0-5 Units 0-5 Units Subcutaneous QHS Michael Boston, MD  2 Units at 10/18/15 2208  . lactated ringers bolus 1,000 mL 1,000 mL Intravenous Q8H PRN Michael Boston, MD    . lip balm (CARMEX) ointment 1 application 1 application Topical BID Michael Boston, MD  1 application at 38/25/05 [phone]  . LORazepam (ATIVAN) injection 0.5-1 mg 0.5-1 mg Intravenous Q8H PRN Michael Boston, MD    . LORazepam (ATIVAN) tablet 1 mg 1 mg Oral QHS Michael Boston, MD  1 mg at 10/19/15 2157  . magic mouthwash 15 mL Oral QID PRN Michael Boston, MD    . menthol-cetylpyridinium (CEPACOL) lozenge 3 mg 1 lozenge Oral PRN Michael Boston, MD    . methocarbamol (ROBAXIN) tablet 1,000 mg 1,000 mg Oral QID Michael Boston, MD  1,000 mg at 10/20/15 0957  . metoprolol (LOPRESSOR) injection 5 mg 5 mg Intravenous Q6H PRN Michael Boston, MD    . ondansetron (ZOFRAN-ODT) disintegrating tablet 4-8 mg 4-8 mg Oral Q6H PRN Michael Boston, MD    . pantoprazole (PROTONIX) EC tablet 80 mg 80 mg Oral Daily Michael Boston, MD  80 mg at 10/20/15 0957  . phenol (CHLORASEPTIC) mouth spray 2 spray 2 spray Mouth/Throat PRN Michael Boston, MD    . prochlorperazine (COMPAZINE) injection 10 mg 10 mg Intravenous Q6H PRN Michael Boston, MD    . saccharomyces boulardii  (FLORASTOR) capsule 250 mg 250 mg Oral BID Michael Boston, MD  250 mg at 10/20/15 0957  . sodium chloride flush (NS) 0.9 % injection 3 mL 3 mL Intravenous Q12H Michael Boston, MD  3 mL at 10/20/15 0959  . sodium chloride flush (NS) 0.9 % injection 3 mL 3 mL Intravenous PRN Michael Boston, MD    . vitamin C (ASCORBIC ACID) tablet 500 mg 500 mg Oral BID Michael Boston, MD  500 mg at 10/20/15 0958     No  Known Allergies  Disposition: 01-Home or Self Care  Discharge Instructions    Call MD for: extreme fatigue  Complete by: As directed      Call MD for: hives  Complete by: As directed      Call MD for: persistant nausea and vomiting  Complete by: As directed      Call MD for: redness, tenderness, or signs of infection (pain, swelling, redness, odor or green/yellow discharge around incision site)  Complete by: As directed      Call MD for: severe uncontrolled pain  Complete by: As directed      Call MD for:  Complete by: As directed   Temperature > 101.57F     Diet - low sodium heart healthy  Complete by: As directed   Start with bland, low residue diet for a few days, then advance to a heart healthy (low fat, high fiber) diet. If you feel nauseated or constipated, simplify to a liquid only diet for 48 hours until you are feeling better (no more nausea, farting/passing gas, having a bowel movement, etc...). If you cannot tolerate even drinking liquids, or feeling worse, let your surgeon know or go to the Emergency Department for help.     Discharge instructions  Complete by: As directed   Please see discharge instruction sheets.  Also refer to any handouts/printouts that may have been given from the [facility] surgery office (if you visited [place] there before surgery) Please call our office if you have any questions or concerns [phone]     Discharge wound care:  Complete by: As  directed   If you have closed incisions: Shower and bathe over these incisions with soap and water every day. It is OK to wash over the dressings: they are waterproof. Remove all surgical dressings on postoperative day #3. You do not need to replace dressings over the closed incisions unless you feel more comfortable with a Band-Aid covering it.   If you have an open wound: That requires packing, so please see wound care instructions.  In general, remove all dressings, wash wound with soap and water and then replace with saline moistened gauze. Do the dressing change at least every day.   Please call our office [phone] if you have further questions.     Driving Restrictions  Complete by: As directed   No driving until off narcotics and can safely swerve away without pain during an emergency     Increase activity slowly  Complete by: As directed   Walk an hour a day. Use 20-30 minute walks. When you can walk 30 minutes without difficulty, it is fine to restart low impact/moderate activities such as biking, jogging, swimming, sexual activity, etc. Eventually you can increase to unrestricted activity when not feeling pain. If you feel pain: STOP![place] Let pain protect you from overdoing it. Use ice/heat & over-the-counter pain medications to help minimize soreness. If that is not enough, then use your narcotic pain prescription as needed to remain active. It is better to take extra pain medications and be more active than to stay bedridden to avoid all pain medications.     Lifting restrictions  Complete by: As directed   Avoid heavy lifting initially, <20 pounds at first.  Do not push through pain.  You have no specific weight limit: If it hurts to do, DON'T DO IT.  If you feel no pain, you are not injuring anything. Pain will protect you from injury.  Coughing and sneezing are far  more stressful to your incision than any lifting.  Avoid  resuming heavy lifting (>50 pounds) or other intense activity until off all narcotic pain medications.  When want to exercise more, give yourself 2 weeks to gradually get back to full intense exercise/activity.     May shower / Bathe  Complete by: As directed   [place]. It is fine for dressings or wounds to be washed/rinsed. Use gentle soap & water. This will help the incisions and/or wounds get clean & minimize infection.     May walk up steps  Complete by: As directed      Sexual Activity Restrictions  Complete by: As directed   Sexual activity as tolerated. Do not push through pain. Pain will protect you from injury.     Walk with assistance  Complete by: As directed   Walk over an hour a day. May use a walker/cane/companion to help with balance and stamina.            Medication List    TAKE these medications       aspirin EC 81 MG tablet  Take 1 tablet (81 mg total) by mouth daily. DO NOT TAKE AGAIN UNTIL 08/09/15     carvedilol 6.25 MG tablet  Commonly known as: COREG  Take 1 tablet (6.25 mg total) by mouth 2 (two) times daily with a meal.     dicyclomine 10 MG capsule  Commonly known as: BENTYL  Take 1 capsule (10 mg total) by mouth 4 (four) times daily - before meals and at bedtime.     glipiZIDE 5 MG 24 hr tablet  Commonly known as: GLUCOTROL XL  TAKE 1 TABLET (5 MG) BY MOUTH DAILY WITH BREAKFAST.     lisinopril-hydrochlorothiazide 20-25 MG tablet  Commonly known as: PRINZIDE,ZESTORETIC  Take 1 tablet by mouth daily.     LORazepam 1 MG tablet  Commonly known as: ATIVAN  TAKE 1 TABLET BY MOUTH EVERY NIGHT AT BEDTIME     metFORMIN 500 MG 24 hr tablet  Commonly known as: GLUCOPHAGE-XR  Take 4 tablets once a day WITH BREAKFAST     metroNIDAZOLE 500 MG tablet  Commonly known as: FLAGYL  Take 2 tablets by mouth 3 (three) times daily. To start day before  procedure. Take 2 tablets at 2pm, 3pm and 10pm     neomycin 500 MG tablet  Commonly known as: MYCIFRADIN  Take 2 tablets by mouth 3 (three) times daily. To start day before procedure. Take 2 tablets at 2pm, 3pm and 10pm     omeprazole 40 MG capsule  Commonly known as: PRILOSEC  Take 1 capsule (40 mg total) by mouth daily.     oxyCODONE 5 MG immediate release tablet  Commonly known as: Oxy IR/ROXICODONE  Take 1-2 tablets (5-10 mg total) by mouth every 4 (four) hours as needed for moderate pain, severe pain or breakthrough pain.     sucralfate 1 g tablet  Commonly known as: CARAFATE  Take 1 tablet (1 g total) by mouth 4 (four) times daily - with meals and at bedtime.     vitamin C 1000 MG tablet  Take 1,000 mg by mouth daily.           Follow-up Information    Follow up with GROSS,STEVEN C., MD. Schedule an appointment as soon as possible for a visit in 3 weeks.   Specialty: General Surgery   Why: To follow up after your hospital stay, To follow up after your  operation   Contact information:   [place] [address] [phone]        Signed: Morton Peters, M.D., F.A.C.S. Gastrointestinal and Minimally Invasive Surgery [facility] [address] [place], [place] [address] [phone] Main / Paging   10/20/2015, 11:33 AM

## 2015-11-07 ENCOUNTER — Telehealth: Payer: Self-pay | Admitting: *Deleted

## 2015-11-07 NOTE — Telephone Encounter (Signed)
Oncology Nurse Navigator Documentation  Oncology Nurse Navigator Flowsheets 11/07/2015  Navigator Location [facility]  Navigator Encounter Type Introductory phone call  Abnormal Finding Date 07/25/2015  Confirmed Diagnosis Date 07/26/2015  Surgery Date 10/18/2015  Spoke with patient and provided new patient appointment for 11/16/15 at 1145 with Dr. Truitt Merle. Other dates/times offered, but this was preferred. Informed of location of [place], valet service, and registration process. Reminded to bring insurance cards and a current medication list, including supplements. Patient verbalizes understanding. Mailed welcome pack to his home after address confirmed. Notified HIM to enter appointment in [facility]

## 2015-11-08 ENCOUNTER — Telehealth: Payer: Self-pay | Admitting: Hematology

## 2015-11-08 ENCOUNTER — Other Ambulatory Visit: Payer: Self-pay | Admitting: Surgery

## 2015-11-08 ENCOUNTER — Encounter: Payer: Self-pay | Admitting: Hematology

## 2015-11-08 NOTE — Telephone Encounter (Signed)
Contacted referring provider regarding pt appt date/time.

## 2015-11-08 NOTE — H&P (Signed)
Jed Kutch 11/06/2015 1:35 PM Location: [facility] Patient #: [id number] DOB: 1970-12-05 Single / Language: [name] / Race: White Male   History of Present Illness [name] MD; 11/06/2015 2:03 PM) The patient is a 45 year old male who presents with colorectal cancer. 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 10/18/2015 Lymph nodes: number examined 29; number positive: 1 Pathologic Staging: T2, N1, M0   Patient returns today from resection earlier in the month. Went home postoperative day #5. Struggle with abdominal soreness & preferred IV medications. He's feeling much better now. Weaned off pain medication. Walking well. Transition to a more high fiber diet more recently. Moving his bowels about once a day. Occasionally has some urgency and feeling of incomplete evacuation bowel movements but not severe. Appetite coming back. No bleeding. No fevers chills or sweats. No problems with urination.      PATIENT: Trinna Post 45 y.o. male  Patient Care Team: Shawnee Knapp, MD as PCP - General (Family Medicine) Michael Boston, MD as Consulting Physician (General Surgery) Fay Records, MD as Consulting Physician (Cardiology) Gatha Mayer, MD as Consulting Physician (Gastroenterology) Alexis Frock, MD as Consulting Physician (Urology)  PRE-OPERATIVE DIAGNOSIS: Sigmoid colon cancer  POST-OPERATIVE DIAGNOSIS: Rectosigmoid colon cancer with urethral stricture  PROCEDURE:  XI ROBOT ASSISTED LOW ANTERIOR RESECTION RIGID PROCTOSCOPY WITH TATTOOING   SURGEON:   Michael Boston, MD  Diagnosis 1. 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 Colon, resection margin (donut), final distal margin BENIGN COLONIC TISSUE, NEGATIVE FOR CARCINOMA Microscopic Comment 1. COLON AND RECTUM (INCLUDING TRANS-ANAL RESECTION): Specimen: Rectosigmoid colon Procedure: Segmental resection Tumor site: Rectal colon Specimen integrity: Intact Macroscopic intactness of mesorectum: Not applicable: NA Complete: NA Near complete: x Incomplete: NA Cannot be determined (specify): NA Macroscopic tumor perforation: Muscularis propria Invasive tumor: Maximum size: 3.8 cm Histologic type(s): Adenocarcinoma Histologic grade and differentiation: G2 G1: well differentiated/low grade G2: moderately differentiated/low grade G3: poorly differentiated/high grade G4: undifferentiated/high grade Type of polyp in which invasive carcinoma arose: Tubular adenoma Microscopic extension of invasive tumor:Muscularis propria Lymph-Vascular invasion: Negative Peri-neural invasion: Negative Tumor deposit(s) (discontinuous extramural extension): Negative Resection margins: Proximal margin: Negative Distal margin: Negative Circumferential (radial) (posterior ascending, posterior descending; lateral and posterior mid-rectum; and entire lower 1/3 rectum):Negative Mesenteric margin (sigmoid and transverse): Negative Distance closest margin (if all above margins negative): 4.8 cm Trans-anal resection margins only: Deep margin: NA Mucosal Margin: NA Distance closest mucosal margin (if negative): NA Treatment effect (neo-adjuvant therapy): NA Additional polyp(s): Hyperplastic poly Non-neoplastic findings: Unremarkable Lymph nodes: number examined 29; number positive: 1 Pathologic Staging: T2, N1, M0 Ancillary studies: Microsatellite instability (PCR) and mismatch repair protein (IHC) has been ordered. 2 of 4 FINAL for ORIAN, FIGUEIRA ([id number]) Microscopic  Comment(continued) [name] MD Pathologist, Electronic Signature (Case signed 10/22/2015) Specimen Ludie Hudon and Clinical Information Specimen(s) Obtained: 1. Colon, segmental resection for tumor, rectosigmoid 2. Colon, resection margin (donut), final distal margin Specimen Clinical Information 1. sigmoid colon cancer with urethral stricture (kp) Makaia Rappa 1. Specimen: Received fresh labeled rectosigmoid colon. Specimen integrity: Intact, with one opened and one stapled resection margin. Per the requistion, the open end is proximal. Specimen length: The specimen length is 20.0 cm  Mesorectal intactness: The proximal two-thirds of the mesorectum appear complete, but are focally disrupted. The intact portion of the mesorectum is inked black overlying the possible lesion. Tumor location: Within the distal aspect of the specimen, on the mesenteric aspect. Tumor size: There is a 3.8 x 3.2 x 1.0 cm tan red, exophytic lesion with a centrally depressed area and rolled borders. Approximately 500 milligrams of fresh superficial tumor is submitted for research purposes. Percent of bowel circumference involved: Approximately 40%. Tumor distance to margins: Proximal: 12.1 cm. Distal: 4.8 cm Radial (posterior ascending, posterior descending; lateral and posterior mid-rectum; and entire lower 1/3 rectum): 6.4 cm to the intact aspect of the mesorectum. Macroscopic extent of tumor invasion: The tumor invades the muscularis propria. Total presumed lymph nodes: Thirty tan pink to gray possible lymph nodes are identified, ranging from 0.1 cm to 0.9 cm in greatest dimension. Extramural satellite tumor nodules: None, identified. Mucosal polyp(s): Distal to the main tumor lesion there is a 0.3 cm in greatest dimension tan sessile polyp, which measures 2.0 cm from the distal resection margin. Additional findings: The uninvolved mucosa is tan pink with normal folding. There is blue gray discoloration  present proximal and distal to the main tumor lesion, suggestive of tattoo powder. Block summary: A,B= proximal resection margin. C,D= distal resection margin. E= mucosal polyp. F-I= lesion. J= uninvolved mucosa. K,L= five possible lymph nodes, each. M-O= four possible lymph nodes, each. P= three possible lymph nodes. Q= two possible lymph nodes. R= one possible lymph node. S= one possible lymph node, bisected. T= one possible lymph node, bisected. 3 of 4 FINAL for JAHID, WEIDA ([id number]) Alveena Taira(continued) U= tissue for molecular studies. (KL:gt, 10/19/15) 2. Received in saline and consists of a circular portion of tan soft tissue, measuring 1.9 x 1.9 x cm in diameter and up to 1.1 cm in length. The exposed mucosa is tan and slightly granular. The specimen displays multiple embedded staples. Representative sections are submitted in one cassette. (KL:gt, 10/19/15) Stain(s) used in Diagnosis: The following stain(s) were used in diagnosing the case: MSH6, MLH1, MSH2, PMS2. The control(s) stained appropriately. Disclaimer Some of these immunohistochemical stains may have been developed and the performance characteristics determined by [facility]. Some may not have been cleared or approved by the U.S. Food and Drug Administration. The FDA has determined that such clearance or approval is not necessary. This test is used for clinical purposes. It should not be regarded as investigational or for research. This laboratory is certified under the [place] (CLIA-88) as qualified to perform high complexity clinical laboratory testing. Report signed out from the following location(s) Technical component and interpretation was performed at [facility] [place], [place], [place] [address]. CLIA #: [id number], 4 of 4  1. Mismatch Repair (MMR) Protein Immunohistochemistry (IHC) IHC Expression Result: MLH1:  Preserved nuclear expression (greater 50% tumor expression) MSH2: Preserved nuclear expression (greater 50% tumor expression) MSH6: Preserved nuclear expression (greater 50% tumor expression) PMS2: Preserved nuclear expression (greater 50% tumor expression) * Internal control demonstrates intact nuclear expression Interpretation: NORMAL There is preserved expression of the major and minor MMR proteins. There is a very low probability that microsatellite instability (MSI) is present. However, certain clinically significant MMR protein mutations may result in preservation of nuclear expression. It is recommended that the preservation of protein expression be correlated with molecular based MSI testing. References: 1. Guidelines on Genetic Evaluation and Management of Lynch Syndrome: A Consensus Statement by the [place]  Multi-Society Task Force on Colorectal Cancer Gae Dry. Sherlie Ban , MD, and others . Am Nicki [place] 2014; [id number]; doi: 10.1038/ajg.2014.186; published online 02 February 2013 2. Outcomes of screening endometrial cancer patients for Lynch syndrome by patient-administered checklist. Olena Heckle MS, and others. Gynecol Oncol 2013;131(3):619-623. Susanne Greenhouse MD Pathologist, Electronic Signat   Allergies [name], [place]; 11/06/2015 1:35 PM) No Known Drug Allergies01/16/2017  Medication History [name], [place]; 11/06/2015 1:36 PM) Neomycin Sulfate (500MG Tablet, 2 (two) Tablet Oral SEE NOTE, Taken starting 07/31/2015) Active. (TAKE TWO TABLETS AT 2 PM, 3 PM, AND 10 PM THE DAY PRIOR TO SURGERY) Flagyl (500MG Tablet, 2 (two) Tablet Oral SEE NOTE, Taken starting 07/31/2015) Active. (Take at 2pm, 3pm, and 10pm the day prior to your colon operation) Carvedilol (6.25MG Tablet, Oral) Active. Dicyclomine HCl (10MG Capsule, Oral) Active. GlipiZIDE ER (5MG Tablet ER 24HR, Oral) Active. Lisinopril-Hydrochlorothiazide (20-25MG Tablet, Oral) Active. LORazepam (1MG Tablet, Oral)  Active. Omeprazole (40MG Capsule DR, Oral) Active. Ascorbic Acid (1000MG Tablet, Oral) Active. Aspirin (81MG Tablet, Oral) Active. OxyCODONE HCl (5MG Tablet, Oral) Active. MetFORMIN HCl ER (500MG Tablet ER 24HR, Oral) Active. Sucralfate (1GM Tablet, Oral) Active. Medications Reconciled  Vitals [name] CMA; 11/06/2015 1:37 PM) 11/06/2015 1:36 PM Weight: 400 lb Height: 75in Body Surface Area: 2.95 m Body Mass Index: 50 kg/m  Temp.: 98.44F  Pulse: 114 (Regular)  BP: 140/82 (Sitting, Left Arm, Standard)       Physical Exam [name] MD; 11/06/2015 1:58 PM) General Mental Status-Alert. General Appearance-Not in acute distress. Voice-Normal. Note: Relaxed. Nontoxic.   Integumentary Global Assessment Upon inspection and palpation of skin surfaces of the - Distribution of scalp and body hair is normal. General Characteristics Overall examination of the patient's skin reveals - no rashes and no suspicious lesions.  Head and Neck Head-normocephalic, atraumatic with no lesions or palpable masses. Face Global Assessment - atraumatic, no absence of expression. Neck Global Assessment - no abnormal movements, no decreased range of motion. Trachea-midline. Thyroid Gland Characteristics - non-tender.  Eye Eyeball - Left-Extraocular movements intact, No Nystagmus. Eyeball - Right-Extraocular movements intact, No Nystagmus. Upper Eyelid - Left-No Cyanotic. Upper Eyelid - Right-No Cyanotic.  Chest and Lung Exam Inspection Accessory muscles - No use of accessory muscles in breathing.  Abdomen Note: Incisions with normal healing ridges. R paramedian normal healing ridge. No cellulitis. No guarding/rebound tenderness   Male Genitourinary Note: No inguinal hernias. Normal external genitalia. Epididymi, testes, and spermatic cords normal without any masses.   Peripheral Vascular Upper Extremity Inspection - Left - Not  Gangrenous, No Petechiae. Right - Not Gangrenous, No Petechiae.  Neurologic Neurologic evaluation reveals -normal attention span and ability to concentrate, able to name objects and repeat phrases. Appropriate fund of knowledge and normal coordination.  Neuropsychiatric Mental status exam performed with findings of-able to articulate well with normal speech/language, rate, volume and coherence and no evidence of hallucinations, delusions, obsessions or homicidal/suicidal ideation. Orientation-oriented X3.  Musculoskeletal Global Assessment Gait and Station - normal gait and station.  Lymphatic General Lymphatics Description - No Generalized lymphadenopathy.    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 I think he is recovering rather well only a few weeks out from [facility] resection surgery.  Continue nutrition and exercise.  Fiber bowel regimen.  Colonoscopy at the 1 year. To rule out recurrence or new polyps  I think he would benefit from a medical oncology consultation  for probable post adjuvant chemotherapy given his young age and lymph node positive status as stage III. We were to try and coordinate that soon.  Like to follow tallies outside of the postoperative risk window at 6 weeks. Should he feel great, he can cancel it. However were make sure its followed. Current Plans Instructed the patient to make an appointment for a follow-up office visit after seeing the recommended specialist. Medical oncology to consider postsurgical chemotherapy for better survival benefit given his lymph node positive status of his colorectal cancer. Pt Education - [facility] - General recommendations Follow up with [place] in the office in 3 WEEKS.  Call us sooner as needed.  Pt Education - Education: Pathology Report given to patient Consider follow up colonoscopy by your  gastroenterologist, depending on your diagnosis. Call your gastroenterologist for advice  You had colorectal cancer resected by surgery. You should strongly consider getting a colonoscopy by your gastroenterologict one year after the colon cancer was removed. If it was a benign polyp that was removed by colon resection, consider follow-up colonoscopy in about 3 years.  Pt Education - [facility] Colectomy post-op instructions: discussed with patient and provided information. Pt Education - [facility] Colorectal Cancer (AT): discussed with patient and provided information. Pt Education - [facility] Fiber (AT) Pt Education - [facility] Good Bowel Health (Ritu Gagliardo)  Addendum: Lymph node positive cancer.  Felt to benefit from chemotherapy.  Request made for portacatheter placement.  We will schedule soon.  [name], M.D., F.A.C.S. Gastrointestinal and Minimally Invasive Surgery [facility] [address] [place], [place] [phone] Main / Paging

## 2015-11-13 ENCOUNTER — Telehealth: Payer: Self-pay | Admitting: *Deleted

## 2015-11-13 NOTE — Telephone Encounter (Signed)
Received message from Dr. Johney Maine that patient was surprised that he would most likely require chemotherapy and did not want to purse the [facility] placement at this time. This RN called and left VM to patient with apology that we were going too quickly for his comfort. Will hold off on port and allow detailed discussion of his case at new patient visit on 11/16/15. Left navigator direct # for him to call with any questions prior to seeing Dr. Burr Medico this week.

## 2015-11-15 NOTE — Progress Notes (Signed)
[place]  Telephone:([phone]) [phone] Fax:([phone]) [place] Note   Patient Care Team: Shawnee Knapp, MD as PCP - General (Family Medicine) Michael Boston, MD as Consulting Physician (General Surgery) Fay Records, MD as Consulting Physician (Cardiology) Gatha Mayer, MD as Consulting Physician (Gastroenterology) Alexis Frock, MD as Consulting Physician (Urology) 11/16/2015      Referring physician:Dr. 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 8 mm descending polyp and 5 mm rectosigmoid polyp;18-29 mm proximal rectal polyp   07/25/2015 Tumor Marker CEA=0.7   07/26/2015 Pathology Results Sigmoid mass: invasive colorectal adenocarcinoma   10/22/2015 Pathologic Stage T2, N1, MO    #29 nodes examined w/ 1 postive node; moderately differentiated; Negative for Lymph-vascular and Peri-neural invasion; Microscopic extension into muscularis propria   10/26/2015 Pathology Results MSI Stable    HISTORY OF PRESENTING ILLNESS:  Chad [name] 45 y.o. [name] is here because of recently diagnosed colon cancer.  He is accompanied by his friend to the clinic  today.  He presented with abdominal pain since Oct 2016,  Was seen in the emergency room on 05/11/2015,  CT abdomen and pelvis showed  Mild thickening of the urinary bladder wall, gallstones, otherwise negative. He was seen by her primary care physician Dr. Brigitte Pulse afterwards, who referred him to GI Dr. Carlean Purl.  His father passed away from [place] cancer around that time,  So his appointment was postponed, and he finally had colonoscopy in early January 2017, which showed multiple polyps, and  A ulcerated mass in distal sigmoid colon.  He was subsequently referred to colorectal surgeon Dr. Johney Maine, and  Eventually had left colectomy  On 11/01/2015.  He tolerated surgery well, and recovered well. He has good appetite good, BM is normal most of time, occasional diarrhea, likely second to food, mild low abdomen pain, which has improves, he lost about 30 lbs before surgery and 10lbs after surgery.   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  He is divorced, he has a 21 year old daughter who lives with her mother in [place]. He has sisters and brothers, and her mother who live close by.   MEDICAL HISTORY:  Past Medical History  Diagnosis Date  . GERD (gastroesophageal reflux disease)   . Hypertension   . Diabetes mellitus without complication ([place]) 12/7670    oral agents only (07/2015)  . Gallstone 12/2014    GB stones and sludge on ultrasound and CT,  . Morbid obesity with BMI of 50.0-59.9, adult ([place]) 03/2013  . Fatty liver 12/2014    on ultrasound. pt obese and abuses ETOH  . ETOH abuse 2014  . Rectal  bleeding 05/2015    07/2015 colonoscopy with malignant appearing sigmoid mass. Additional rectal, descending, polyps  . Coronary artery disease   . Shoulder pain, bilateral     due to previous injury  . Cancer [facility])     colon cancer    SURGICAL HISTORY: Past Surgical History  Procedure Laterality Date  . Cardiac catheterization N/A 04/06/2015     Procedure: Left Heart Cath and Coronary Angiography;  Surgeon: Belva Crome, MD;  Location: [facility];  Service: Cardiovascular;  Laterality: N/A;  . Colonoscopy N/A 07/25/2015    Procedure: COLONOSCOPY;  Surgeon: Gatha Mayer, MD;  Location: [facility];  Service: Gastroenterology;  Laterality: N/A;  . Esophagogastroduodenoscopy N/A 07/25/2015    Procedure: ESOPHAGOGASTRODUODENOSCOPY (EGD);  Surgeon: Gatha Mayer, MD;  Location: [facility];  Service: Gastroenterology;  Laterality: N/A;  . Cystoscopy  10/18/2015    Procedure: CYSTOSCOPY FLEXIBLE URETHRAL DIALATION AND FOLEY PLACEMENT;  Surgeon: Alexis Frock, MD;  Location: [facility];  Service: Urology;;    SOCIAL HISTORY: Social History   Social History  . Marital Status: Legally Separated    Spouse Name: N/A  . Number of Children: 1  . Years of Education: N/A   Occupational History  . Manager    Social History Main Topics  . Smoking status: Never Smoker   . Smokeless tobacco: Current User     Comment: occasionally dips tobacco  . Alcohol Use: Yes     Comment: heavy drinking for 10 years, 2-3  drinks of liqour a night, has been cutting back.    . Drug Use: No  . Sexual Activity: Not on file   Other Topics Concern  . Not on file   Social History Narrative   Divorced, has 82 yo daughter lives in [place]   Lives alone   Manages chain of car wash dealers       FAMILY HISTORY: Family History  Problem Relation Age of Onset  . Diabetes Father   . Pancreatic cancer Father   . Cancer Maternal Uncle     unknown type of cancer     ALLERGIES:  has No Known Allergies.  MEDICATIONS:  Current Outpatient Prescriptions  Medication Sig Dispense Refill  . Ascorbic Acid (VITAMIN C) 1000 MG tablet Take 1,000 mg by mouth daily.    Marland Kitchen aspirin EC 81 MG tablet Take 1 tablet (81 mg total) by mouth daily. DO NOT TAKE AGAIN UNTIL 08/09/15    . carvedilol (COREG) 6.25 MG tablet Take 1 tablet (6.25 mg total) by mouth 2 (two) times  daily with a meal. (Patient taking differently: Take 6.25 mg by mouth every morning. ) 180 tablet 1  . glipiZIDE (GLUCOTROL XL) 5 MG 24 hr tablet TAKE 1 TABLET (5 MG) BY MOUTH DAILY WITH BREAKFAST. 90 tablet 1  . lisinopril-hydrochlorothiazide (PRINZIDE,ZESTORETIC) 20-25 MG tablet Take 1 tablet by mouth daily. 90 tablet 1  . LORazepam (ATIVAN) 1 MG tablet TAKE 1 TABLET BY MOUTH EVERY NIGHT AT BEDTIME (Patient taking differently: TAKE 1 TABLET at bedtime -  patient uses as needed for nausea) 30 tablet 1  . metFORMIN (GLUCOPHAGE-XR) 500 MG 24 hr tablet Take 4 tablets once a day WITH BREAKFAST 360 tablet 3  . omeprazole (PRILOSEC) 40 MG capsule Take 1 capsule (40 mg total) by mouth daily. 90 capsule 3  . oxyCODONE (OXY IR/ROXICODONE) 5 MG immediate release tablet Take 1-2 tablets (5-10 mg total) by mouth every 4 (four) hours as needed for moderate pain, severe  pain or breakthrough pain. 40 tablet 0  . capecitabine (XELODA) 500 MG tablet Take 5 tablets (2,500 mg total) by mouth 2 (two) times daily after a meal. 140 tablet 1  . ondansetron (ZOFRAN) 8 MG tablet Take 1 tablet (8 mg total) by mouth 2 (two) times daily as needed for refractory nausea / vomiting. Start on day 3 after chemotherapy. 30 tablet 1  . prochlorperazine (COMPAZINE) 10 MG tablet Take 1 tablet (10 mg total) by mouth every 6 (six) hours as needed (Nausea or vomiting). 30 tablet 1   No current facility-administered medications for this visit.    REVIEW OF SYSTEMS:   Constitutional: Denies fevers, chills or abnormal night sweats Eyes: Denies blurriness of vision, double vision or watery eyes Ears, nose, mouth, throat, and face: Denies mucositis or sore throat Respiratory: Denies cough, dyspnea or wheezes Cardiovascular: Denies palpitation, chest discomfort or lower extremity swelling Gastrointestinal:  Denies nausea, heartburn or change in bowel habits Skin: Denies abnormal skin rashes Lymphatics: Denies new lymphadenopathy or easy  bruising Neurological:Denies numbness, tingling or new weaknesses Behavioral/Psych: Mood is stable, no new changes  All other systems were reviewed with the patient and are negative.  PHYSICAL EXAMINATION: ECOG PERFORMANCE STATUS: 1 - Symptomatic but completely ambulatory  Filed Vitals:   11/16/15 1127  BP: 147/88  Pulse: 94  Temp: 98.8 F (37.1 C)  Resp: 17   Filed Weights   11/16/15 1127  Weight: 398 lb (180.532 kg)    GENERAL:alert, no distress and comfortable,  Morbidly obese. SKIN: skin color, texture, turgor are normal, no rashes or significant lesions EYES: normal, conjunctiva are pink and non-injected, sclera clear OROPHARYNX:no exudate, no erythema and lips, buccal mucosa, and tongue normal  NECK: supple, thyroid normal size, non-tender, without nodularity LYMPH:  no palpable lymphadenopathy in the cervical, axillary or inguinal LUNGS: clear to auscultation and percussion with normal breathing effort HEART: regular rate & rhythm and no murmurs and no lower extremity edema ABDOMEN:abdomen soft,  Surgical incision and laparoscopic scars are well-healed. non-tender and normal bowel sounds Musculoskeletal:no cyanosis of digits and no clubbing  PSYCH: alert & oriented x 3 with fluent speech NEURO: no focal motor/sensory deficits  LABORATORY DATA:  I have reviewed the data as listed CBC Latest Ref Rng 10/19/2015 10/18/2015 10/18/2015  WBC 4.0 - 10.5 K/uL 12.7(H) 15.8(H) -  Hemoglobin 13.0 - 17.0 g/dL 12.5(L) 14.6 15.0  Hematocrit 39.0 - 52.0 % 36.3(L) 41.6 44.0  Platelets 150 - 400 K/uL 283 270 -    CMP Latest Ref Rng 10/19/2015 10/18/2015 10/18/2015  Glucose 65 - 99 mg/dL 214(H) - -  BUN 6 - 20 mg/dL 9 - -  Creatinine 0.61 - 1.24 mg/dL 0.86 0.93 -  Sodium 135 - 145 mmol/L 136 - 135  Potassium 3.5 - 5.1 mmol/L 4.5 - 4.2  Chloride 101 - 111 mmol/L 102 - -  CO2 22 - 32 mmol/L 27 - -  Calcium 8.9 - 10.3 mg/dL 8.1(L) - -  Total Protein 6.1 - 8.1 g/dL - - -  Total Bilirubin 0.2  - 1.2 mg/dL - - -  Alkaline Phos 40 - 115 U/L - - -  AST 10 - 40 U/L - - -  ALT 9 - 46 U/L - - -    PATHOLOGY REPORT: Diagnosis 10/18/2015 1. Colon, segmental resection for tumor, rectosigmoid INFILTRATIVE MODERATELY DIFFERENTIATED ADENOCARCINOMA OF THE COLON (3.8 CM) THE TUMOR INVADES MUSCULARIS PROPRIA MARGINS OF RESECTION ARE NEGATIVE FOR TUMOR METASTATIC COLONIC ADENOCARCINOMA IN ONE OF  TWENTY-NINE LYMPH NODES (1/29) 2. Colon, resection margin (donut), final distal margin BENIGN COLONIC TISSUE, NEGATIVE FOR CARCINOMA Microscopic Comment 1. COLON AND RECTUM (INCLUDING TRANS-ANAL RESECTION): Specimen: Rectosigmoid colon Procedure: Segmental resection Tumor site: Rectal colon Specimen integrity: Intact Macroscopic intactness of mesorectum: Not applicable: NA Complete: NA Near complete: x Incomplete: NA Cannot be determined (specify): NA Macroscopic tumor perforation: Muscularis propria Invasive tumor: Maximum size: 3.8 cm Histologic type(s): Adenocarcinoma Histologic grade and differentiation: G2 G1: well differentiated/low grade G2: moderately differentiated/low grade G3: poorly differentiated/high grade G4: undifferentiated/high grade Type of polyp in which invasive carcinoma arose: Tubular adenoma Microscopic extension of invasive tumor:Muscularis propria Lymph-Vascular invasion: Negative Peri-neural invasion: Negative Tumor deposit(s) (discontinuous extramural extension): Negative Resection margins: Proximal margin: Negative Distal margin: Negative Circumferential (radial) (posterior ascending, posterior descending; lateral and posterior mid-rectum; and entire lower 1/3 rectum):Negative Mesenteric margin (sigmoid and transverse): Negative Distance closest margin (if all above margins negative): 4.8 cm Trans-anal resection margins only: Deep margin: NA Mucosal Margin: NA Distance closest mucosal margin (if negative): NA Treatment effect (neo-adjuvant therapy):  NA Additional polyp(s): Hyperplastic poly Non-neoplastic findings: Unremarkable Lymph nodes: number examined 29; number positive: 1 Pathologic Staging: T2, N1, M0   Mismatch Repair (MMR) Protein Immunohistochemistry (IHC) IHC Expression Result: MLH1: Preserved nuclear expression (greater 50% tumor expression) MSH2: Preserved nuclear expression (greater 50% tumor expression) MSH6: Preserved nuclear expression (greater 50% tumor expression) PMS2: Preserved nuclear expression (greater 50% tumor expression) * Internal control demonstrates intact nuclear expression Interpretation: NORMAL     Diagnosis 07/25/2015 1. Colon, polyp(s), descending - TUBULAR ADENOMA. NO HIGH GRADE DYSPLASIA OR MALIGNANCY IDENTIFIED. 2. Colon, biopsy, distal sigmoid mass - INVASIVE COLORECTAL ADENOCARCINOMA. 3. Colon, polyp(s), small recto sigmoid - HYPERPLASTIC POLYP. NO ADENOMATOUS CHANGE OR MALIGNANCY. 4. Rectum, polyp(s) - TUBULOVILLOUS ADENOMA. NO HIGH GRADE DYSPLASIA OR MALIGNANCY IDENTIFIED.   RADIOGRAPHIC STUDIES: I have personally reviewed the radiological images as listed and agreed with the findings in the report.  CT abdomen and pelvis w contrast 05/10/2016 IMPRESSION: Gallstones with sludge in the gallbladder. By CT, gallbladder wall does not appear appreciably thickened.  Liver prominent without focal liver lesion appreciable.  Mild thickening of the urinary bladder wall. Question a degree of cystitis. Correlation with urinalysis advised. No renal or ureteral calculus. No hydronephrosis.  No bowel obstruction. No abscess. Appendix appears normal.  CT chest angio 05/10/2016 IMPRESSION: Dilatation of the ascending aorta as described to 4.4 cm. Some patient motion artifact is noted which somewhat limits the measurement technique.  Coronary artery calcifications.  No other acute abnormality is noted.   COLONOSCOPY 07/25/2015 ENDOSCOPIC IMPRESSION: 1) 1/4 circumference  firm ulcerated mass in distal sigmmoid - approx 22 cm from verge. Looks like cancer. Biopsied. 2) 8 mm descending polyp and 5 mm rectosigmoid polyp removed cold snare, completely recovered and sent to path. 3) 18-20 mm pedunculated proximal rectal polyp removed hot snare and completely recovered for pathology. 4) Otherwise normal colonoscopy RECOMMENDATIONS: 1. Hold Aspirin and all other NSAIDS for 2 weeks. 2. Will call pathology results and plans though anticipate surgical referral after pathology in. CEA will be drawn at hospital today. He has had recent CT's of chest, and abd/pelvis - will see if he needs new ones - he may.  EGD 07/25/2015 ENDOSCOPIC IMPRESSION: Normal appearing esophagus and GE junction, the stomach was well visualized and normal in appearance, normal appearing duodenum  ASSESSMENT & PLAN:  45 year old Caucasian [name], with past medical history of heavy alcohol drinking, hypertension, diabetes, coronary artery disease, presented with abdominal  pain.  1.  Cancer of left colon, Distal sigmoid, invasive adenocarcinoma, G2, pT2N1aM0, stage IIIA, MSI-stable -I reviewed his CT scan findings, and the surgical pathology findings include details with patient. -he has locally advanced sigmoid colon cancer, with one out of 29 lymph nodes positive. -We discussed the risk of cancer recurrence after's complete surgical resection. Giving his stage IIIa disease, he is at moderate to high risk for recurrence. -His initial staging CT scan was done 6 months ago, I recommend him to have a repeat a CT chest, abdomen and pelvis with contrast for restaging. -We discussed the standard care for stage III colon cancer is adjuvant chemotherapy. Given his young age, I recommend combined chemotherapy, FOLFOX or CAPEOX. Due to his work schedule, he opted CAPEOX. --Chemotherapy consent: Side effects including but does not not limited to, fatigue, nausea, vomiting, diarrhea, hair loss, neuropathy, fluid  retention, renal and kidney dysfunction, neutropenic fever, needed for blood transfusion, bleeding, coronary artery spasm and heart attack,were discussed with patient in great detail. He agrees to proceed. -we discussed co-pay issue with Xeloda -he has multiple medical comorbidities, including hypertension, diabetes, and coronary artery disease. We discussed today potential impacts on his blood pressure, diabetes, and side effects on her heart from chemotherapy in details. -I'll tentatively start him on chemotherapy in 2 weeks.  2. Genetics -per NCCN guideline, due to his young age and family history of pancreatic cancer, he is qualified for genetic testing to ruled out inheritable genetic syndromes.  3. HTN, DM, CAD, morbid obesity -he will continue follow-up with his primary care physician -We will monitor his blood pressure, glucose closely during the chemotherapy, and may need to adjust his the medication if needed.  4. History of alcohol abuse -I strongly recommend him to stop alcohol completely, especially during the chemotherapy. He agrees.  Recommendations: Based on information available as of today's consult. Recommendations may change depending on the results of further tests or exams. 1) Chemotherapy with CAPEOX regimen (Oxaliplatin & Xeloda) every 3 weeks X 8 cycles 2) Restaging CT scan chest, abdomen, pelvis in next week 3) Genetic counselor referral ______________________________________________________________________________ Next Steps: 1) Chemo education class and labs-will schedule today 2) CT scan next week-radiology will call after insurance approval completed 3) Plan to start chemotherapy week of 11/27/15, I will see him before first cycle chemo   Orders Placed This Encounter  Procedures  . CT Abdomen Pelvis W Contrast    Standing Status: Future     Number of Occurrences:      Standing Expiration Date: 11/15/2016    Order Specific Question:  If indicated for the  ordered procedure, I authorize the administration of contrast media per Radiology protocol    Answer:  Yes    Order Specific Question:  Reason for Exam (SYMPTOM  OR DIAGNOSIS REQUIRED)    Answer:  Surveillance, rule out mets    Order Specific Question:  Preferred imaging location?    Answer:  [facility]  . CT Chest W Contrast    Standing Status: Future     Number of Occurrences:      Standing Expiration Date: 11/15/2016    Order Specific Question:  If indicated for the ordered procedure, I authorize the administration of contrast media per Radiology protocol    Answer:  Yes    Order Specific Question:  Reason for Exam (SYMPTOM  OR DIAGNOSIS REQUIRED)    Answer:  Surveillance, rule out mets    Order Specific Question:  Preferred imaging location?  Answer:  [facility]  . CBC with Differential    Standing Status: Standing     Number of Occurrences: 30     Standing Expiration Date: 11/15/2020  . Comprehensive metabolic panel    Standing Status: Standing     Number of Occurrences: 30     Standing Expiration Date: 11/15/2020  . CEA    Standing Status: Standing     Number of Occurrences: 15     Standing Expiration Date: 11/15/2020  . Ambulatory referral to Genetics    Referral Priority:  Routine    Referral Type:  Consultation    Referral Reason:  Specialty Services Required    Number of Visits Requested:  1    All questions were answered. The patient knows to call the clinic with any problems, questions or concerns. I spent 55 minutes counseling the patient face to face. The total time spent in the appointment was 60 minutes and more than 50% was on counseling.     Truitt Merle, MD 11/16/2015 5:50 PM

## 2015-11-16 ENCOUNTER — Encounter: Payer: Self-pay | Admitting: *Deleted

## 2015-11-16 ENCOUNTER — Ambulatory Visit (HOSPITAL_BASED_OUTPATIENT_CLINIC_OR_DEPARTMENT_OTHER): Payer: BLUE CROSS/BLUE SHIELD | Admitting: Hematology

## 2015-11-16 ENCOUNTER — Encounter: Payer: Self-pay | Admitting: Hematology

## 2015-11-16 ENCOUNTER — Telehealth: Payer: Self-pay | Admitting: Hematology

## 2015-11-16 ENCOUNTER — Telehealth: Payer: Self-pay | Admitting: Pharmacist

## 2015-11-16 VITALS — BP 147/88 | HR 94 | Temp 98.8°F | Resp 17 | Ht 75.0 in | Wt 398.0 lb

## 2015-11-16 DIAGNOSIS — F1010 Alcohol abuse, uncomplicated: Secondary | ICD-10-CM

## 2015-11-16 DIAGNOSIS — I2510 Atherosclerotic heart disease of native coronary artery without angina pectoris: Secondary | ICD-10-CM | POA: Diagnosis not present

## 2015-11-16 DIAGNOSIS — C19 Malignant neoplasm of rectosigmoid junction: Secondary | ICD-10-CM

## 2015-11-16 DIAGNOSIS — E119 Type 2 diabetes mellitus without complications: Secondary | ICD-10-CM | POA: Diagnosis not present

## 2015-11-16 DIAGNOSIS — C187 Malignant neoplasm of sigmoid colon: Secondary | ICD-10-CM

## 2015-11-16 DIAGNOSIS — I10 Essential (primary) hypertension: Secondary | ICD-10-CM

## 2015-11-16 DIAGNOSIS — C186 Malignant neoplasm of descending colon: Secondary | ICD-10-CM

## 2015-11-16 MED ORDER — CAPECITABINE 500 MG PO TABS: 850.0000 mg/m2 | ORAL_TABLET | Freq: Two times a day (BID) | ORAL | Status: DC

## 2015-11-16 MED ORDER — ONDANSETRON HCL 8 MG PO TABS: 8.0000 mg | ORAL_TABLET | Freq: Two times a day (BID) | ORAL | Status: DC | PRN

## 2015-11-16 MED ORDER — PROCHLORPERAZINE MALEATE 10 MG PO TABS: 10.0000 mg | ORAL_TABLET | Freq: Four times a day (QID) | ORAL | Status: DC | PRN

## 2015-11-16 NOTE — Progress Notes (Signed)
Oncology Nurse Navigator Documentation  Oncology Nurse Navigator Flowsheets 11/16/2015  Navigator Location [facility]  Navigator Encounter Type Initial MedOnc  Abnormal Finding Date -  Confirmed Diagnosis Date -  Surgery Date -  Patient Visit Type MedOnc;Initial  Treatment Phase Pre-Tx/Tx Discussion  Barriers/Navigation Needs Education  Education Understanding Cancer/ Treatment Options;Coping with Diagnosis/ Prognosis;Newly Diagnosed Cancer Education;Preparing for Upcoming  Treatment  Interventions Education Method  Education Method Verbal;Written;Teach-back  Support Groups/Services GI Support Group;[employer] for Set designer;[place]   Acuity Level 2  Time Spent with Patient 24  Met with patient and a friend, [name] during new patient visit. Explained the role of the GI Nurse Navigator and provided New Patient Packet with information on: 1. Colon cancer--nutrition during cancer treatment 2. Support groups 3. Fall Safety Plan 4. Financial Resources Answered questions, reviewed current treatment plan using TEACH back and provided emotional support. Provided copy of current treatment plan.  Chad [name] lives alone (divorced several years) and his mother lives 10 minutes away and sees him often. His 23 year old daughter lives in [place] with her mother He has two brothers in [place] and a sister in [place]. 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 Has supportive friend network also. He monitors his BP and his glucose with home monitors. He reports being a heavy drinker for many years, but now only has 3 drinks/week. He informed MD that he can stop drinking easily. He was also encouraged to share need for colonoscopy for his siblings and also his daughter needs to be vigilant when she is an adult and may need 1st colonoscopy at age 64  instead of 46. Made him aware of procedure for obtaining the Xeloda and that Oxaliplatin is a vesicant and there is still potential need for a port (he wishes strongly to avoid this). Escorted him to scheduler.  Merceda Elks, RN, BSN GI Oncology [place]

## 2015-11-16 NOTE — Telephone Encounter (Signed)
Pt scheduled w/ lisa because provider had no availability

## 2015-11-16 NOTE — Telephone Encounter (Signed)
Gave pt appt & avs °

## 2015-11-16 NOTE — Telephone Encounter (Addendum)
11/16/15 @ 1600: Xeloda rx requires specialty pharmacy. Rx submitted to [facility]   11/16/15 @ 1400: New Rx for Xeloda faxed to [facility]

## 2015-11-16 NOTE — Patient Instructions (Signed)
  Care Plan Summary- 11/16/2015 Name:  Chad [name]     DOB: 12-Apr-1971 Your Medical Team:  Medical Oncologist:  Dr. Truitt Merle Radiation Oncologist:   Surgeon:   Dr. Michael Boston Type of Cancer: Colon Cancer-adenocarcinoma  Stage/Grade: Stage III *Exact staging of your cancer is based on size of the tumor, depth of invasion, involvement of lymph nodes or not, and whether or not the cancer has spread beyond the primary site.   Recommendations: Based on information available as of today's consult. Recommendations may change depending on the results of further tests or exams. 1) Chemotherapy with CAPEOX regimen (Oxaliplatin & Xeloda) every 3 weeks X 8 cycles 2) Restaging CT scan chest, abdomen, pelvis in next week 3) Genetic counselor referral ______________________________________________________________________________ Next Steps: 1) Chemo education class and labs-will schedule today 2) CT scan next week-radiology will call after insurance approval completed 3) Plan to start chemotherapy week of 11/27/15 4) Schedule appointment with genetics Questions? Merceda Elks, RN, BSN at [phone]. Chad [name] is your Oncology Nurse Navigator and is available to assist you while you're receiving your medical care at [facility].

## 2015-11-21 ENCOUNTER — Telehealth: Payer: Self-pay | Admitting: *Deleted

## 2015-11-21 NOTE — Telephone Encounter (Signed)
  Oncology Nurse Navigator Documentation  Navigator Location: [facility] (11/21/15 1745) Navigator Encounter Type: Telephone (11/21/15 1745) Telephone: Outgoing Call;Patient Update (11/21/15 1745)  Called to follow up on the status of his Xeloda script that was sent to [facility]  on 11/16/15. He reports that he has not heard from the pharmacy yet. Will have oral chemo pharmacist follow up on this tomorrow. 1st treatment is 11/30/15.

## 2015-11-22 ENCOUNTER — Ambulatory Visit (HOSPITAL_COMMUNITY)
Admission: RE | Admit: 2015-11-22 | Discharge: 2015-11-22 | Disposition: A | Discharge status: Home / Self Care | Payer: BLUE CROSS/BLUE SHIELD | Source: Ambulatory Visit | Attending: Hematology | Admitting: Hematology

## 2015-11-22 ENCOUNTER — Telehealth: Payer: Self-pay | Admitting: Pharmacist

## 2015-11-22 DIAGNOSIS — K6389 Other specified diseases of intestine: Secondary | ICD-10-CM | POA: Diagnosis not present

## 2015-11-22 DIAGNOSIS — K760 Fatty (change of) liver, not elsewhere classified: Secondary | ICD-10-CM | POA: Diagnosis not present

## 2015-11-22 DIAGNOSIS — C19 Malignant neoplasm of rectosigmoid junction: Secondary | ICD-10-CM | POA: Insufficient documentation

## 2015-11-22 DIAGNOSIS — R188 Other ascites: Secondary | ICD-10-CM | POA: Insufficient documentation

## 2015-11-22 MED ORDER — IOPAMIDOL (ISOVUE-300) INJECTION 61%
150.0000 mL | Freq: Once | INTRAVENOUS | Status: AC | PRN
  Administered 2015-11-22: 125 mL via INTRAVENOUS

## 2015-11-22 NOTE — Telephone Encounter (Signed)
11/22/15: Spoke with [facility] to verify day supply. Provided information needed. They will now process rx and contact patient once copay is determined and medication is ready to ship  Thank you Montel Clock PharmD, [facility]

## 2015-11-23 ENCOUNTER — Other Ambulatory Visit: Payer: BLUE CROSS/BLUE SHIELD

## 2015-11-23 ENCOUNTER — Telehealth: Payer: Self-pay | Admitting: *Deleted

## 2015-11-23 NOTE — Telephone Encounter (Signed)
No additional note

## 2015-11-24 ENCOUNTER — Telehealth: Payer: Self-pay | Admitting: *Deleted

## 2015-11-24 ENCOUNTER — Encounter: Payer: Self-pay | Admitting: Hematology

## 2015-11-24 ENCOUNTER — Telehealth: Payer: Self-pay | Admitting: Hematology

## 2015-11-24 NOTE — Telephone Encounter (Signed)
cld & spoke to pt and adv of appt time & date of r/s chemo class per [facility]

## 2015-11-24 NOTE — Progress Notes (Signed)
Sent prior auth req for xeloda-covermymeds and will let [facility] know

## 2015-11-24 NOTE — Telephone Encounter (Signed)
Faxed script to [facility] with clarification of instructions to take 5 tabs bid daily for 14 days then off for 7 days to start 11/30/15.

## 2015-11-28 ENCOUNTER — Encounter: Payer: Self-pay | Admitting: Pharmacist

## 2015-11-28 ENCOUNTER — Encounter: Payer: Self-pay | Admitting: *Deleted

## 2015-11-28 ENCOUNTER — Other Ambulatory Visit: Payer: BLUE CROSS/BLUE SHIELD

## 2015-11-28 NOTE — Progress Notes (Signed)
Oral Chemotherapy Pharmacist Encounter   I spoke with patient for overview of new oral chemotherapy medication: Xeloda. Pt is doing well. Patient's insurance requires [employer] for Xeloda. Spoke with [facility] this morning. Copay is $0 and patient can call to schedule delivery. Plan to start on 5/18  Counseled patient on administration, dosing, side effects, safe handling, and monitoring. Side effects include but not limited to: Fatigue, diarrhea, nausea, mouth sores, and hand/foot syndrome.  Mr. Goldson voiced understanding and appreciation. Xeloda will be used in combination with Oxaliplatin   All questions answered.  Will follow up in 1 week for adherence and toxicity management.   Thank you,  Montel Clock, PharmD, [facility]

## 2015-11-30 ENCOUNTER — Encounter: Payer: Self-pay | Admitting: *Deleted

## 2015-11-30 ENCOUNTER — Ambulatory Visit (HOSPITAL_BASED_OUTPATIENT_CLINIC_OR_DEPARTMENT_OTHER): Payer: BLUE CROSS/BLUE SHIELD

## 2015-11-30 ENCOUNTER — Ambulatory Visit (HOSPITAL_BASED_OUTPATIENT_CLINIC_OR_DEPARTMENT_OTHER): Payer: BLUE CROSS/BLUE SHIELD | Admitting: Nurse Practitioner

## 2015-11-30 ENCOUNTER — Ambulatory Visit: Payer: BLUE CROSS/BLUE SHIELD | Admitting: Nutrition

## 2015-11-30 ENCOUNTER — Telehealth: Payer: Self-pay | Admitting: Nurse Practitioner

## 2015-11-30 ENCOUNTER — Other Ambulatory Visit (HOSPITAL_BASED_OUTPATIENT_CLINIC_OR_DEPARTMENT_OTHER): Payer: BLUE CROSS/BLUE SHIELD

## 2015-11-30 VITALS — BP 135/77 | HR 89

## 2015-11-30 VITALS — BP 161/76 | HR 95 | Temp 99.0°F | Resp 19 | Ht 75.0 in | Wt 398.4 lb

## 2015-11-30 DIAGNOSIS — Z5111 Encounter for antineoplastic chemotherapy: Secondary | ICD-10-CM | POA: Diagnosis not present

## 2015-11-30 DIAGNOSIS — E119 Type 2 diabetes mellitus without complications: Secondary | ICD-10-CM | POA: Diagnosis not present

## 2015-11-30 DIAGNOSIS — I2510 Atherosclerotic heart disease of native coronary artery without angina pectoris: Secondary | ICD-10-CM

## 2015-11-30 DIAGNOSIS — C187 Malignant neoplasm of sigmoid colon: Secondary | ICD-10-CM

## 2015-11-30 DIAGNOSIS — C19 Malignant neoplasm of rectosigmoid junction: Secondary | ICD-10-CM

## 2015-11-30 DIAGNOSIS — C186 Malignant neoplasm of descending colon: Secondary | ICD-10-CM

## 2015-11-30 DIAGNOSIS — I10 Essential (primary) hypertension: Secondary | ICD-10-CM

## 2015-11-30 DIAGNOSIS — F1010 Alcohol abuse, uncomplicated: Secondary | ICD-10-CM

## 2015-11-30 LAB — COMPREHENSIVE METABOLIC PANEL
ALBUMIN: 3.8 g/dL (ref 3.5–5.0)
ALK PHOS: 49 U/L (ref 40–150)
ALT: 39 U/L (ref 0–55)
ANION GAP: 9 meq/L (ref 3–11)
AST: 26 U/L (ref 5–34)
BILIRUBIN TOTAL: 0.6 mg/dL (ref 0.20–1.20)
BUN: 10 mg/dL (ref 7.0–26.0)
CO2: 25 meq/L (ref 22–29)
CREATININE: 0.8 mg/dL (ref 0.7–1.3)
Calcium: 9.5 mg/dL (ref 8.4–10.4)
Chloride: 107 mEq/L (ref 98–109)
Glucose: 173 mg/dl — ABNORMAL HIGH (ref 70–140)
Potassium: 4 mEq/L (ref 3.5–5.1)
SODIUM: 141 meq/L (ref 136–145)
Total Protein: 7.6 g/dL (ref 6.4–8.3)

## 2015-11-30 LAB — CBC WITH DIFFERENTIAL/PLATELET
BASO%: 0.3 % (ref 0.0–2.0)
BASOS ABS: 0 10*3/uL (ref 0.0–0.1)
EOS%: 2.7 % (ref 0.0–7.0)
Eosinophils Absolute: 0.3 10*3/uL (ref 0.0–0.5)
HCT: 45.2 % (ref 38.4–49.9)
HEMOGLOBIN: 15.2 g/dL (ref 13.0–17.1)
LYMPH%: 33.3 % (ref 14.0–49.0)
MCH: 31.8 pg (ref 27.2–33.4)
MCHC: 33.6 g/dL (ref 32.0–36.0)
MCV: 94.6 fL (ref 79.3–98.0)
MONO#: 0.7 10*3/uL (ref 0.1–0.9)
MONO%: 6.9 % (ref 0.0–14.0)
NEUT%: 56.8 % (ref 39.0–75.0)
NEUTROS ABS: 5.6 10*3/uL (ref 1.5–6.5)
Platelets: 267 10*3/uL (ref 140–400)
RBC: 4.78 10*6/uL (ref 4.20–5.82)
RDW: 13.7 % (ref 11.0–14.6)
WBC: 9.9 10*3/uL (ref 4.0–10.3)
lymph#: 3.3 10*3/uL (ref 0.9–3.3)

## 2015-11-30 MED ORDER — DEXTROSE 5 % IV SOLN
Freq: Once | INTRAVENOUS | Status: AC
  Administered 2015-11-30: 15:00:00 via INTRAVENOUS

## 2015-11-30 MED ORDER — PALONOSETRON HCL INJECTION 0.25 MG/5ML
0.2500 mg | Freq: Once | INTRAVENOUS | Status: AC
Start: 2015-11-30 — End: 2015-11-30
  Administered 2015-11-30: 0.25 mg via INTRAVENOUS

## 2015-11-30 MED ORDER — PALONOSETRON HCL INJECTION 0.25 MG/5ML
INTRAVENOUS | Status: AC
  Filled 2015-11-30: qty 5

## 2015-11-30 MED ORDER — SODIUM CHLORIDE 0.9 % IV SOLN
8.0000 mg | Freq: Once | INTRAVENOUS | Status: AC
  Administered 2015-11-30: 8 mg via INTRAVENOUS
  Filled 2015-11-30: qty 0.8

## 2015-11-30 MED ORDER — DEXTROSE 5 % IV SOLN
130.0000 mg/m2 | Freq: Once | INTRAVENOUS | Status: AC
  Administered 2015-11-30: 400 mg via INTRAVENOUS
  Filled 2015-11-30: qty 80

## 2015-11-30 NOTE — Patient Instructions (Signed)
[facility] Discharge Instructions for Patients Receiving Chemotherapy  Today you received the following chemotherapy agents: Oxaliplatin   To help prevent nausea and vomiting after your treatment, we encourage you to take your nausea medication as directed.    If you develop nausea and vomiting that is not controlled by your nausea medication, call the clinic.   BELOW ARE SYMPTOMS THAT SHOULD BE REPORTED IMMEDIATELY:  *FEVER GREATER THAN 100.5 F  *CHILLS WITH OR WITHOUT FEVER  NAUSEA AND VOMITING THAT IS NOT CONTROLLED WITH YOUR NAUSEA MEDICATION  *UNUSUAL SHORTNESS OF BREATH  *UNUSUAL BRUISING OR BLEEDING  TENDERNESS IN MOUTH AND THROAT WITH OR WITHOUT PRESENCE OF ULCERS  *URINARY PROBLEMS  *BOWEL PROBLEMS  UNUSUAL RASH Items with * indicate a potential emergency and should be followed up as soon as possible.  Feel free to call the clinic you have any questions or concerns. The clinic phone number is [phone].  Please show the CHEMO ALERT CARD at check-in to the Emergency Department and triage nurse.   Oxaliplatin Injection What is this medicine? OXALIPLATIN (ox AL i PLA tin) is a chemotherapy drug. It targets fast dividing cells, like cancer cells, and causes these cells to die. This medicine is used to treat cancers of the colon and rectum, and many other cancers. This medicine may be used for other purposes; ask your health care provider or pharmacist if you have questions. What should I tell my health care provider before I take this medicine? They need to know if you have any of these conditions: -kidney disease -an unusual or allergic reaction to oxaliplatin, other chemotherapy, other medicines, foods, dyes, or preservatives -pregnant or trying to get pregnant -breast-feeding How should I use this medicine? This drug is given as an infusion into a vein. It is administered in a hospital or clinic by a specially trained health care  professional. Talk to your pediatrician regarding the use of this medicine in children. Special care may be needed. Overdosage: If you think you have taken too much of this medicine contact a poison control center or emergency room at once. NOTE: This medicine is only for you. Do not share this medicine with others. What if I miss a dose? It is important not to miss a dose. Call your doctor or health care professional if you are unable to keep an appointment. What may interact with this medicine? -medicines to increase blood counts like filgrastim, pegfilgrastim, sargramostim -probenecid -some antibiotics like amikacin, gentamicin, neomycin, polymyxin B, streptomycin, tobramycin -zalcitabine Talk to your doctor or health care professional before taking any of these medicines: -acetaminophen -aspirin -ibuprofen -ketoprofen -naproxen This list may not describe all possible interactions. Give your health care provider a list of all the medicines, herbs, non-prescription drugs, or dietary supplements you use. Also tell them if you smoke, drink alcohol, or use illegal drugs. Some items may interact with your medicine. What should I watch for while using this medicine? Your condition will be monitored carefully while you are receiving this medicine. You will need important blood work done while you are taking this medicine. This medicine can make you more sensitive to cold. Do not drink cold drinks or use ice. Cover exposed skin before coming in contact with cold temperatures or cold objects. When out in cold weather wear warm clothing and cover your mouth and nose to warm the air that goes into your lungs. Tell your doctor if you get sensitive to the cold. This drug may make you   feel generally unwell. This is not uncommon, as chemotherapy can affect healthy cells as well as cancer cells. Report any side effects. Continue your course of treatment even though you feel ill unless your doctor tells you  to stop. In some cases, you may be given additional medicines to help with side effects. Follow all directions for their use. Call your doctor or health care professional for advice if you get a fever, chills or sore throat, or other symptoms of a cold or flu. Do not treat yourself. This drug decreases your body's ability to fight infections. Try to avoid being around people who are sick. This medicine may increase your risk to bruise or bleed. Call your doctor or health care professional if you notice any unusual bleeding. Be careful brushing and flossing your teeth or using a toothpick because you may get an infection or bleed more easily. If you have any dental work done, tell your dentist you are receiving this medicine. Avoid taking products that contain aspirin, acetaminophen, ibuprofen, naproxen, or ketoprofen unless instructed by your doctor. These medicines may hide a fever. Do not become pregnant while taking this medicine. Women should inform their doctor if they wish to become pregnant or think they might be pregnant. There is a potential for serious side effects to an unborn child. Talk to your health care professional or pharmacist for more information. Do not breast-feed an infant while taking this medicine. Call your doctor or health care professional if you get diarrhea. Do not treat yourself. What side effects may I notice from receiving this medicine? Side effects that you should report to your doctor or health care professional as soon as possible: -allergic reactions like skin rash, itching or hives, swelling of the face, lips, or tongue -low blood counts - This drug may decrease the number of white blood cells, red blood cells and platelets. You may be at increased risk for infections and bleeding. -signs of infection - fever or chills, cough, sore throat, pain or difficulty passing urine -signs of decreased platelets or bleeding - bruising, pinpoint red spots on the skin, black,  tarry stools, nosebleeds -signs of decreased red blood cells - unusually weak or tired, fainting spells, lightheadedness -breathing problems -chest pain, pressure -cough -diarrhea -jaw tightness -mouth sores -nausea and vomiting -pain, swelling, redness or irritation at the injection site -pain, tingling, numbness in the hands or feet -problems with balance, talking, walking -redness, blistering, peeling or loosening of the skin, including inside the mouth -trouble passing urine or change in the amount of urine Side effects that usually do not require medical attention (report to your doctor or health care professional if they continue or are bothersome): -changes in vision -constipation -hair loss -loss of appetite -metallic taste in the mouth or changes in taste -stomach pain This list may not describe all possible side effects. Call your doctor for medical advice about side effects. You may report side effects to FDA at 1-800-FDA-1088. Where should I keep my medicine? This drug is given in a hospital or clinic and will not be stored at home. NOTE: This sheet is a summary. It may not cover all possible information. If you have questions about this medicine, talk to your doctor, pharmacist, or health care provider.    2016, Elsevier/Gold Standard. (2008-01-26 17:22:47)  

## 2015-11-30 NOTE — Progress Notes (Signed)
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  He is a patient of Dr. Burr Medico.  Past medical history includes diabetes, morbid obesity, alcohol, CAD and colon cancer.  Medications include vitamin C, Xeloda, Glucotrol, Ativan, Glucophage, Prilosec, Zofran, and Compazine  Labs were reviewed.  Height: 63 inches. Weight: 398 pounds. Usual body weight: 390 pounds per patient. BMI: 49.75.  Patient is receiving his first treatment today. Denies nutrition impact symptoms currently. Atteneded chemotherapy education class and understands nutrition strategies during treatment. Patient has no questions today.  Nutrition diagnosis: Food and nutrition related knowledge deficit related to new diagnosis of cancer as evidenced by no prior need for nutrition related information.  Intervention: Patient educated on small frequent meals and snacks with adequate protein to maintain lean body mass. Brief education provided on strategies for eating if he develops nausea, vomiting, diarrhea. Patient was referred to chemotherapy education booklet for food strategies. Questions were answered and teach back method used. Patient was provided with my contact information.  Monitoring, evaluation, goals: Patient will tolerate adequate calories and protein to minimize nutrition impact symptoms and maintain lean body mass.  Next visit:To be scheduled as needed.  **Disclaimer: This note was dictated with voice recognition software. Similar sounding words can inadvertently be transcribed and this note may contain transcription errors which may not have been corrected upon publication of note.**

## 2015-11-30 NOTE — Progress Notes (Signed)
[place] OFFICE PROGRESS NOTE   Diagnosis:  Cancer of the left colon, distal sigmoid 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  Staging form: Colon and Rectum, AJCC 7th Edition  Pathologic stage from 10/18/2015: Stage IIIA (T2, N1a, cM0) - Signed by Truitt Merle, MD on 11/15/2015       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   05/11/2015 Imaging CT ABD/PELVIS: Gallstones w/sludge;liver prominent without focal liver lesion, mild thickening of bladder wall; negative for cancer   05/18/2015 Imaging [place] ABD: Negative   07/25/2015 Initial Diagnosis 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 8 mm descending polyp and 5 mm rectosigmoid polyp;18-29 mm proximal rectal polyp   07/25/2015 Tumor Marker CEA=0.7   07/26/2015 Pathology Results Sigmoid mass: invasive colorectal adenocarcinoma   10/22/2015 Pathologic Stage T2, N1, MO #29 nodes examined w/ 1 postive node; moderately differentiated; Negative for Lymph-vascular and Peri-neural invasion; Microscopic extension into muscularis propria   10/26/2015 Pathology Results MSI Stable        INTERVAL HISTORY:   Chad [name] returns as scheduled. He denies nausea/vomiting. No mouth sores. He has intermittent loose stools. Typically no more than 2 bowel movements a day. He has an occasional "twinge" of abdominal pain. No back pain. No fever.  Objective:  Vital signs in last 24 hours:  Blood pressure 161/76, pulse 95, temperature 99 F (37.2 C), temperature source Oral, resp. rate 19, height '6\' 3"'  (1.905 m), weight 398 lb 6.4 oz (180.713 kg), SpO2 98 %.    HEENT: No thrush or ulcers. Resp: Lungs  clear bilaterally. Cardio: Regular rate and rhythm. GI: Abdomen soft and nontender. No hepatomegaly. Healed surgical incisions. Vascular: No leg edema.    Lab Results:  Lab Results  Component Value Date   WBC 9.9 11/30/2015   HGB 15.2 11/30/2015   HCT 45.2 11/30/2015   MCV 94.6 11/30/2015   PLT 267 11/30/2015   NEUTROABS 5.6 11/30/2015    Imaging:  No results found.  Medications: I have reviewed the patient's current medications.  Assessment/Plan: 1. Cancer of left colon, Distal sigmoid, invasive adenocarcinoma, G2, pT2N1aM0, stage IIIA, MSI-stable;   CT scans 11/22/2015 with no evidence of metastatic disease   Cycle 1 adjuvant CAPOX beginning 11/30/2015 2. Genetics. Per NCCN guidelines due to his young age and family history of pancreatic cancer he is qualified for genetic testing to rule out inheritable genetic syndromes. He has been referred to the genetics counselor. 3. CT scan 11/22/2015 with a complex retroperitoneal fluid collection anterior to the aortic bifurcation measuring 8.6 x 6.1 x 9.1 cm. No air or contrast noted. Additional smaller component measuring 3.4 x 2.0 cm. Plan for repeat CT imaging at a 3-6 month interval as long as he remains asymptomatic. 4. Hypertension, diabetes, CAD, morbid obesity. 5. History of alcohol abuse. He has agreed to abstain from alcohol during the chemotherapy.   Disposition: [name] appears stable. Plan to proceed with cycle 1 adjuvant CAPOX today as scheduled. Potential toxicities associated with oxaliplatin and Xeloda were again reviewed at today's visit and questions were answered.  He will return for a follow-up visit/nadir check on 12/13/2015.  He will return for a follow-up visit and cycle 2 CAPOX 12/21/2015. He will contact the office in  the interim with any problems.  Plan reviewed with Dr. Burr Medico.     Ned Card ANP/GNP-BC   11/30/2015  3:11 PM

## 2015-11-30 NOTE — Progress Notes (Signed)
Oncology Nurse Navigator Documentation  Oncology Nurse Navigator Flowsheets 11/30/2015  Navigator Location [facility]  Navigator Encounter Type Treatment  Telephone -  Abnormal Finding Date -  Confirmed Diagnosis Date -  Surgery Date -  Treatment Initiated Date 11/30/2015  Patient Visit Type MedOnc  Treatment Phase First Chemo Tx--CAPEOX  Barriers/Navigation Needs Education--antiemetic regimen & xeloda regimen  Education  Symptom Management  Interventions Education Method  Education Method Verbal;Teach-back  Support Groups/Services GI Support Group;Other--#2 free massages and how to register  Acuity Level 1  Time Spent with Patient 15  Met with patient during initial chemotherapy treatment to provide support and assess for needs to promote continuity of care. Thankful that IV stick was easy. Xeloda has no copay. Will start today after supper.  Merceda Elks, RN, BSN GI Oncology [place]

## 2015-11-30 NOTE — Telephone Encounter (Signed)
Pt will p/u new sched in tx room

## 2015-12-01 ENCOUNTER — Telehealth: Payer: Self-pay

## 2015-12-01 LAB — CEA: CEA: 4.5 ng/mL (ref 0.0–4.7)

## 2015-12-01 NOTE — Telephone Encounter (Signed)
-----   Message from Flo Shanks, RN sent at 11/30/2015  4:33 PM EDT ----- Regarding: 1st time Chemo/ Dr. Burr Medico Ist time Oxaliplatin.

## 2015-12-01 NOTE — Telephone Encounter (Signed)
Called [facility] for chemotherapy F/U.  Patient is doing well.  Denies n/v.  Denies any new side effects or symptoms.  Bowel and bladder is functioning well.  Eating and drinking well and I instructed to drink 64 oz minimum daily or at least the day before, of and after treatment.  Denies questions at this time and encouraged to call if needed.  Reviewed how to call after hours in the case of an emergency.

## 2015-12-07 ENCOUNTER — Other Ambulatory Visit: Payer: Self-pay | Admitting: Family Medicine

## 2015-12-08 ENCOUNTER — Telehealth: Payer: Self-pay

## 2015-12-08 NOTE — Telephone Encounter (Signed)
.  Oral Chemotherapy Follow-Up Form  Original Start date of oral chemotherapy: _5/2017______   Called patient today to follow up regarding patient's oral chemotherapy medication: __ xeloda__  Pt is doing well today with a few complaints of just feeling lousy and still have sensitivity to the cold from the oxaliplatin.  Patient states he does have some nausea with no vomiting and has antinauseants at home if needed. I encouraged him to try taking the prochlorperazine prior to his xeloda to see if this helps alleviate any nausea.  He also has the typical back pains etc but his "getting through it".  I also encouraged him to keep a diary of his symptoms to communicate with the doctor on his next office visit.  It was a pleasure getting to visit with him and I hope he feels much better  Pt reports _0___ tablets/doses missed in the last week/month.  Missed dose(s) attributed to: ___0_________  Pt reports the following side effects: ___________  Other Issues: _________   Will follow up and call patient again in __with next cycle_______   Thank you,  Henreitta Leber, PharmD [facility]

## 2015-12-13 ENCOUNTER — Telehealth: Payer: Self-pay | Admitting: Hematology

## 2015-12-13 ENCOUNTER — Ambulatory Visit (HOSPITAL_BASED_OUTPATIENT_CLINIC_OR_DEPARTMENT_OTHER): Payer: BLUE CROSS/BLUE SHIELD | Admitting: Hematology

## 2015-12-13 ENCOUNTER — Other Ambulatory Visit (HOSPITAL_BASED_OUTPATIENT_CLINIC_OR_DEPARTMENT_OTHER): Payer: BLUE CROSS/BLUE SHIELD

## 2015-12-13 ENCOUNTER — Encounter: Payer: Self-pay | Admitting: Hematology

## 2015-12-13 ENCOUNTER — Telehealth: Payer: Self-pay | Admitting: *Deleted

## 2015-12-13 VITALS — BP 168/75 | HR 96 | Temp 98.5°F | Resp 19 | Ht 75.0 in | Wt 397.5 lb

## 2015-12-13 DIAGNOSIS — E1165 Type 2 diabetes mellitus with hyperglycemia: Secondary | ICD-10-CM

## 2015-12-13 DIAGNOSIS — C186 Malignant neoplasm of descending colon: Secondary | ICD-10-CM

## 2015-12-13 DIAGNOSIS — I10 Essential (primary) hypertension: Secondary | ICD-10-CM | POA: Diagnosis not present

## 2015-12-13 LAB — COMPREHENSIVE METABOLIC PANEL
ALBUMIN: 3.6 g/dL (ref 3.5–5.0)
ALK PHOS: 45 U/L (ref 40–150)
ALT: 40 U/L (ref 0–55)
AST: 29 U/L (ref 5–34)
Anion Gap: 9 mEq/L (ref 3–11)
BILIRUBIN TOTAL: 0.49 mg/dL (ref 0.20–1.20)
BUN: 13.7 mg/dL (ref 7.0–26.0)
CALCIUM: 9.4 mg/dL (ref 8.4–10.4)
CO2: 24 mEq/L (ref 22–29)
Chloride: 107 mEq/L (ref 98–109)
Creatinine: 0.9 mg/dL (ref 0.7–1.3)
Glucose: 254 mg/dl — ABNORMAL HIGH (ref 70–140)
POTASSIUM: 4.5 meq/L (ref 3.5–5.1)
Sodium: 140 mEq/L (ref 136–145)
Total Protein: 7.2 g/dL (ref 6.4–8.3)

## 2015-12-13 LAB — CBC WITH DIFFERENTIAL/PLATELET
BASO%: 0.6 % (ref 0.0–2.0)
Basophils Absolute: 0.1 10*3/uL (ref 0.0–0.1)
EOS ABS: 0.2 10*3/uL (ref 0.0–0.5)
EOS%: 2.1 % (ref 0.0–7.0)
HEMATOCRIT: 43.2 % (ref 38.4–49.9)
HEMOGLOBIN: 14.7 g/dL (ref 13.0–17.1)
LYMPH#: 2.7 10*3/uL (ref 0.9–3.3)
LYMPH%: 26.4 % (ref 14.0–49.0)
MCH: 31.8 pg (ref 27.2–33.4)
MCHC: 34.1 g/dL (ref 32.0–36.0)
MCV: 93.3 fL (ref 79.3–98.0)
MONO#: 0.8 10*3/uL (ref 0.1–0.9)
MONO%: 7.9 % (ref 0.0–14.0)
NEUT%: 63 % (ref 39.0–75.0)
NEUTROS ABS: 6.5 10*3/uL (ref 1.5–6.5)
PLATELETS: 279 10*3/uL (ref 140–400)
RBC: 4.63 10*6/uL (ref 4.20–5.82)
RDW: 14.1 % (ref 11.0–14.6)
WBC: 10.3 10*3/uL (ref 4.0–10.3)

## 2015-12-13 NOTE — Progress Notes (Signed)
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   Staging form: Colon and Rectum, AJCC 7th Edition     Pathologic stage from 10/18/2015: Stage IIIA (T2, N1a, cM0) - Signed by Truitt Merle, MD on 11/15/2015       Cancer of left colon ([place])   05/11/2015 Imaging CT ABD/PELVIS: Gallstones w/sludge;liver prominent without focal liver lesion, mild thickening of bladder wall; negative for cancer   05/18/2015 Imaging [place] ABD: Negative   07/25/2015 Initial Diagnosis 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 8 mm descending polyp and 5 mm rectosigmoid polyp;18-29 mm proximal rectal polyp   07/25/2015 Tumor Marker CEA=0.7   07/26/2015 Pathology Results Sigmoid mass: invasive colorectal adenocarcinoma   10/22/2015 Pathologic Stage T2, N1, MO    #29 nodes examined w/ 1 postive node; moderately differentiated; Negative for Lymph-vascular and Peri-neural invasion; Microscopic extension into muscularis propria   10/26/2015 Pathology Results MSI Stable    HISTORY OF PRESENTING ILLNESS:  Chad [name] 45 y.o. male is here because of recently diagnosed colon cancer.  He is accompanied by his friend to the clinic today.  He presented with abdominal pain since Oct 2016,  Was seen in the emergency room on  05/11/2015,  CT abdomen and pelvis showed  Mild thickening of the urinary bladder wall, gallstones, otherwise negative. He was seen by her primary care physician Dr. Brigitte Pulse afterwards, who referred him to GI Dr. Carlean Purl.  His father passed away from [place] cancer around that time,  So his appointment was postponed, and he finally had colonoscopy in early January 2017, which showed multiple polyps, and  A ulcerated mass in distal sigmoid colon.  He was subsequently referred to colorectal surgeon Dr. Johney Maine, and  Eventually had left colectomy  On 11/01/2015.  He tolerated surgery well, and recovered well. He has good appetite good, BM is normal most of time, occasional diarrhea, likely second to food, mild low abdomen pain, which has improves, he lost about 30 lbs before surgery and 10lbs after surgery.   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  He is divorced, he has a 79 year old daughter who lives with her mother in [place]. He has sisters and brothers, and her mother who live close by.   CURRENT THERAPY: Adjuvant chemotherapy with Capecitabine 2500 mg twice daily, on day 1-14, oxaliplatin 130 mg/m, every 21 days  INTERIM HISTORY: [place] returns for follow-up and toxicity check up after first cycle chemotherapy. He started CAPEOX on 11/30/2015, and I'll finish Xeloda tomorrow. She developed mild to moderate nausea, no vomiting, for about week after the first dose of chemotherapy infusion, he took Zofran which helped for nausea. He had significant cold sensitivity and numbness on fingers and toes, resolved after 1 week. Mild fatigue, he was able to tolerating daily work without much  difficulty. No fever or chills, no other side effects. He overall feels much better this week.  MEDICAL HISTORY:  Past Medical History  Diagnosis Date  . GERD (gastroesophageal reflux disease)   . Hypertension   . Diabetes mellitus without complication ([place]) 08/4266     oral agents only (07/2015)  . Gallstone 12/2014    GB stones and sludge on ultrasound and CT,  . Morbid obesity with BMI of 50.0-59.9, adult ([place]) 03/2013  . Fatty liver 12/2014    on ultrasound. pt obese and abuses ETOH  . ETOH abuse 2014  . Rectal bleeding 05/2015    07/2015 colonoscopy with malignant appearing sigmoid mass. Additional rectal, descending, polyps  . Coronary artery disease   . Shoulder pain, bilateral     due to previous injury  . Cancer [facility])     colon cancer    SURGICAL HISTORY: Past Surgical History  Procedure Laterality Date  . Cardiac catheterization N/A 04/06/2015    Procedure: Left Heart Cath and Coronary Angiography;  Surgeon: Belva Crome, MD;  Location: [facility];  Service: Cardiovascular;  Laterality: N/A;  . Colonoscopy N/A 07/25/2015    Procedure: COLONOSCOPY;  Surgeon: Gatha Mayer, MD;  Location: [facility] ENDOSCOPY;  Service: Gastroenterology;  Laterality: N/A;  . Esophagogastroduodenoscopy N/A 07/25/2015    Procedure: ESOPHAGOGASTRODUODENOSCOPY (EGD);  Surgeon: Gatha Mayer, MD;  Location: [facility];  Service: Gastroenterology;  Laterality: N/A;  . Cystoscopy  10/18/2015    Procedure: CYSTOSCOPY FLEXIBLE URETHRAL DIALATION AND FOLEY PLACEMENT;  Surgeon: Alexis Frock, MD;  Location: [facility] ORS;  Service: Urology;;    SOCIAL HISTORY: Social History   Social History  . Marital Status: Legally Separated    Spouse Name: N/A  . Number of Children: 1  . Years of Education: N/A   Occupational History  . Manager    Social History Main Topics  . Smoking status: Never Smoker   . Smokeless tobacco: Current User     Comment: occasionally dips tobacco  . Alcohol Use: Yes     Comment: heavy drinking for 10 years, 2-3  drinks of liqour a night, has been cutting back.    . Drug Use: No  . Sexual Activity: Not on file   Other Topics Concern  . Not on file   Social History Narrative   Divorced, has 15 yo daughter lives in [place]   Lives alone    Manages chain of car wash dealers       FAMILY HISTORY: Family History  Problem Relation Age of Onset  . Diabetes Father   . Pancreatic cancer Father   . Cancer Maternal Uncle     unknown type of cancer     ALLERGIES:  has No Known Allergies.  MEDICATIONS:  Current Outpatient Prescriptions  Medication Sig Dispense Refill  . Ascorbic Acid (VITAMIN C) 1000 MG tablet Take 1,000 mg by mouth daily.    Marland Kitchen aspirin EC 81 MG tablet Take 1 tablet (81 mg total) by mouth daily. DO NOT TAKE AGAIN UNTIL 08/09/15    . capecitabine (XELODA) 500 MG tablet Take 5 tablets (2,500 mg total) by mouth 2 (two) times daily after a meal. 140 tablet 1  . carvedilol (COREG) 6.25 MG tablet Take 1 tablet (6.25 mg total) by mouth 2 (two) times daily with a meal. (Patient taking differently: Take 6.25 mg by mouth every morning. ) 180 tablet 1  . glipiZIDE (GLUCOTROL XL) 5 MG 24 hr tablet TAKE 1 TABLET (5 MG)  BY MOUTH DAILY WITH BREAKFAST. 90 tablet 1  . lisinopril-hydrochlorothiazide (PRINZIDE,ZESTORETIC) 20-25 MG tablet Take 1 tablet by mouth daily. 90 tablet 1  . LORazepam (ATIVAN) 1 MG tablet TAKE 1 TABLET BY MOUTH EVERY NIGHT AT BEDTIME (Patient taking differently: TAKE 1 TABLET at bedtime -  patient uses as needed for nausea) 30 tablet 1  . metFORMIN (GLUCOPHAGE-XR) 500 MG 24 hr tablet Take 4 tablets once a day WITH BREAKFAST 360 tablet 3  . omeprazole (PRILOSEC) 40 MG capsule Take 1 capsule (40 mg total) by mouth daily. 90 capsule 3  . ondansetron (ZOFRAN) 8 MG tablet Take 1 tablet (8 mg total) by mouth 2 (two) times daily as needed for refractory nausea / vomiting. Start on day 3 after chemotherapy. 30 tablet 1  . oxyCODONE (OXY IR/ROXICODONE) 5 MG immediate release tablet Take 1-2 tablets (5-10 mg total) by mouth every 4 (four) hours as needed for moderate pain, severe pain or breakthrough pain. 40 tablet 0  . prochlorperazine (COMPAZINE) 10 MG tablet Take 1 tablet (10 mg total) by mouth every 6 (six) hours as  needed (Nausea or vomiting). 30 tablet 1   No current facility-administered medications for this visit.    REVIEW OF SYSTEMS:   Constitutional: Denies fevers, chills or abnormal night sweats Eyes: Denies blurriness of vision, double vision or watery eyes Ears, nose, mouth, throat, and face: Denies mucositis or sore throat Respiratory: Denies cough, dyspnea or wheezes Cardiovascular: Denies palpitation, chest discomfort or lower extremity swelling Gastrointestinal:  Denies nausea, heartburn or change in bowel habits Skin: Denies abnormal skin rashes Lymphatics: Denies new lymphadenopathy or easy bruising Neurological:Denies numbness, tingling or new weaknesses Behavioral/Psych: Mood is stable, no new changes  All other systems were reviewed with the patient and are negative.  PHYSICAL EXAMINATION: ECOG PERFORMANCE STATUS: 1 - Symptomatic but completely ambulatory  Filed Vitals:   12/13/15 0952  BP: 168/75  Pulse: 96  Temp: 98.5 F (36.9 C)  Resp: 19   Filed Weights   12/13/15 0952  Weight: 397 lb 8 oz (180.305 kg)    GENERAL:alert, no distress and comfortable,  Morbidly obese. SKIN: skin color, texture, turgor are normal, no rashes or significant lesions EYES: normal, conjunctiva are pink and non-injected, sclera clear OROPHARYNX:no exudate, no erythema and lips, buccal mucosa, and tongue normal  NECK: supple, thyroid normal size, non-tender, without nodularity LYMPH:  no palpable lymphadenopathy in the cervical, axillary or inguinal LUNGS: clear to auscultation and percussion with normal breathing effort HEART: regular rate & rhythm and no murmurs and no lower extremity edema ABDOMEN:abdomen soft,  Surgical incision and laparoscopic scars are well-healed. non-tender and normal bowel sounds Musculoskeletal:no cyanosis of digits and no clubbing  PSYCH: alert & oriented x 3 with fluent speech NEURO: no focal motor/sensory deficits  LABORATORY DATA:  I have reviewed the  data as listed CBC Latest Ref Rng 12/13/2015 11/30/2015 10/19/2015  WBC 4.0 - 10.3 10e3/uL 10.3 9.9 12.7(H)  Hemoglobin 13.0 - 17.1 g/dL 14.7 15.2 12.5(L)  Hematocrit 38.4 - 49.9 % 43.2 45.2 36.3(L)  Platelets 140 - 400 10e3/uL 279 267 283    CMP Latest Ref Rng 12/13/2015 11/30/2015 10/19/2015  Glucose 70 - 140 mg/dl 254(H) 173(H) 214(H)  BUN 7.0 - 26.0 mg/dL 13.7 10.0 9  Creatinine 0.7 - 1.3 mg/dL 0.9 0.8 0.86  Sodium 136 - 145 mEq/L 140 141 136  Potassium 3.5 - 5.1 mEq/L 4.5 4.0 4.5  Chloride 101 - 111 mmol/L - - 102  CO2 22 -  29 mEq/L _0 Calcium 8.4 - 10.4 mg/dL 9.4 9.5 8.1(L)  Total Protein 6.4 - 8.3 g/dL 7.2 7.6 -  Total Bilirubin 0.20 - 1.20 mg/dL 0.49 0.60 -  Alkaline Phos 40 - 150 U/L 45 49 -  AST 5 - 34 U/L 29 26 -  ALT 0 - 55 U/L 40 39 -    PATHOLOGY REPORT: Diagnosis 10/18/2015 1. Colon, segmental resection for tumor, rectosigmoid INFILTRATIVE MODERATELY DIFFERENTIATED ADENOCARCINOMA OF THE COLON (3.8 CM) THE TUMOR INVADES MUSCULARIS PROPRIA MARGINS OF RESECTION ARE NEGATIVE FOR TUMOR METASTATIC COLONIC ADENOCARCINOMA IN ONE OF TWENTY-NINE LYMPH NODES (1/29) 2. Colon, resection margin (donut), final distal margin BENIGN COLONIC TISSUE, NEGATIVE FOR CARCINOMA Microscopic Comment 1. COLON AND RECTUM (INCLUDING TRANS-ANAL RESECTION): Specimen: Rectosigmoid colon Procedure: Segmental resection Tumor site: Rectal colon Specimen integrity: Intact Macroscopic intactness of mesorectum: Not applicable: NA Complete: NA Near complete: x Incomplete: NA Cannot be determined (specify): NA Macroscopic tumor perforation: Muscularis propria Invasive tumor: Maximum size: 3.8 cm Histologic type(s): Adenocarcinoma Histologic grade and differentiation: G2 G1: well differentiated/low grade G2: moderately differentiated/low grade G3: poorly differentiated/high grade G4: undifferentiated/high grade Type of polyp in which invasive carcinoma arose: Tubular adenoma Microscopic  extension of invasive tumor:Muscularis propria Lymph-Vascular invasion: Negative Peri-neural invasion: Negative Tumor deposit(s) (discontinuous extramural extension): Negative Resection margins: Proximal margin: Negative Distal margin: Negative Circumferential (radial) (posterior ascending, posterior descending; lateral and posterior mid-rectum; and entire lower 1/3 rectum):Negative Mesenteric margin (sigmoid and transverse): Negative Distance closest margin (if all above margins negative): 4.8 cm Trans-anal resection margins only: Deep margin: NA Mucosal Margin: NA Distance closest mucosal margin (if negative): NA Treatment effect (neo-adjuvant therapy): NA Additional polyp(s): Hyperplastic poly Non-neoplastic findings: Unremarkable Lymph nodes: number examined 29; number positive: 1 Pathologic Staging: T2, N1, M0   Mismatch Repair (MMR) Protein Immunohistochemistry (IHC) IHC Expression Result: MLH1: Preserved nuclear expression (greater 50% tumor expression) MSH2: Preserved nuclear expression (greater 50% tumor expression) MSH6: Preserved nuclear expression (greater 50% tumor expression) PMS2: Preserved nuclear expression (greater 50% tumor expression) * Internal control demonstrates intact nuclear expression Interpretation: NORMAL     Diagnosis 07/25/2015 1. Colon, polyp(s), descending - TUBULAR ADENOMA. NO HIGH GRADE DYSPLASIA OR MALIGNANCY IDENTIFIED. 2. Colon, biopsy, distal sigmoid mass - INVASIVE COLORECTAL ADENOCARCINOMA. 3. Colon, polyp(s), small recto sigmoid - HYPERPLASTIC POLYP. NO ADENOMATOUS CHANGE OR MALIGNANCY. 4. Rectum, polyp(s) - TUBULOVILLOUS ADENOMA. NO HIGH GRADE DYSPLASIA OR MALIGNANCY IDENTIFIED.   RADIOGRAPHIC STUDIES: I have personally reviewed the radiological images as listed and agreed with the findings in the report.  CT abdomen and pelvis w contrast 05/10/2016 IMPRESSION: Gallstones with sludge in the gallbladder. By CT, gallbladder  wall does not appear appreciably thickened.  Liver prominent without focal liver lesion appreciable.  Mild thickening of the urinary bladder wall. Question a degree of cystitis. Correlation with urinalysis advised. No renal or ureteral calculus. No hydronephrosis.  No bowel obstruction. No abscess. Appendix appears normal.  CT chest angio 05/10/2016 IMPRESSION: Dilatation of the ascending aorta as described to 4.4 cm. Some patient motion artifact is noted which somewhat limits the measurement technique.  Coronary artery calcifications.  No other acute abnormality is noted.   COLONOSCOPY 07/25/2015 ENDOSCOPIC IMPRESSION: 1) 1/4 circumference firm ulcerated mass in distal sigmmoid - approx 22 cm from verge. Looks like cancer. Biopsied. 2) 8 mm descending polyp and 5 mm rectosigmoid polyp removed cold snare, completely recovered and sent to path. 3) 18-20 mm pedunculated proximal rectal polyp removed hot snare and completely recovered for pathology.  4) Otherwise normal colonoscopy RECOMMENDATIONS: 1. Hold Aspirin and all other NSAIDS for 2 weeks. 2. Will call pathology results and plans though anticipate surgical referral after pathology in. CEA will be drawn at hospital today. He has had recent CT's of chest, and abd/pelvis - will see if he needs new ones - he may.  EGD 07/25/2015 ENDOSCOPIC IMPRESSION: Normal appearing esophagus and GE junction, the stomach was well visualized and normal in appearance, normal appearing duodenum  ASSESSMENT & PLAN:  45 year old Caucasian male, with past medical history of heavy alcohol drinking, hypertension, diabetes, coronary artery disease, presented with abdominal pain.  1.  Cancer of left colon, Distal sigmoid, invasive adenocarcinoma, G2, pT2N1aM0, stage IIIA, MSI-stable -I reviewed his CT scan findings, and the surgical pathology findings include details with patient. -he has locally advanced sigmoid colon cancer, with one out of  29 lymph nodes positive. -We discussed the risk of cancer recurrence after's complete surgical resection. Giving his stage IIIa disease, he is at moderate to high risk for recurrence. -His initial staging CT scan was done 6 months ago, I recommend him to have a repeat a CT chest, abdomen and pelvis with contrast for restaging. -We discussed the standard care for stage III colon cancer is adjuvant chemotherapy. Given his young age, I recommend combined chemotherapy, FOLFOX or CAPEOX. Due to his work schedule, he opted CAPEOX. -He tolerated the first cycle chemotherapy well overall, with mild to moderate nausea, cold sensitivity and neuropathy, resolved now. -Lab results reviewed with him, no significant cytopenia, CMP within normal limits except hyperglycemia.  2. Genetics -per NCCN guideline, due to his young age and family history of pancreatic cancer, he is qualified for genetic testing to ruled out inheritable genetic syndromes. -He is scheduled to see genetic counselor on June 8  3. HTN, DM, CAD, morbid obesity -he will continue follow-up with his primary care physician -We will monitor his blood pressure, glucose closely during the chemotherapy, and may need to adjust his the medication if needed.  4. History of alcohol abuse -He has stopped alcohol completely since he started chemotherapy.   5. Nausea and neuropathy -Second to chemotherapy. It has resolved now -Nausea management was reviewed with him again. -We'll monitor his neuropathy closely.  Plan -tolerating chemo well overall, lab reviewed with him -He will return on 6/8 for second cycle chemotherapy -I'll see him before cycle 3.  All questions were answered. The patient knows to call the clinic with any problems, questions or concerns. I spent 15 minutes counseling the patient face to face. The total time spent in the appointment was 20 minutes and more than 50% was on counseling.     Truitt Merle, MD 12/13/2015 6:45  PM

## 2015-12-13 NOTE — Telephone Encounter (Signed)
Per staff message and POF I have scheduled appts. Advised scheduler of appts. JMW  

## 2015-12-13 NOTE — Telephone Encounter (Signed)
per pof to sch pt appt-sent MW email to sch trmt-gave pt copy of avs °

## 2015-12-14 ENCOUNTER — Telehealth: Payer: Self-pay | Admitting: Hematology

## 2015-12-14 NOTE — Telephone Encounter (Signed)
per pof to sch pt appt-cld & spoke to pt and adv of appt time & date °

## 2015-12-19 ENCOUNTER — Telehealth: Payer: Self-pay | Admitting: *Deleted

## 2015-12-19 NOTE — Telephone Encounter (Signed)
Received call from pt stating that he needed OK from Dr Burr Medico to have a tooth pulled.  He is having pain & wants to have tooth pulled tomorrow before next treatment on thurs. Discussed with Dr Burr Medico & informed pt OK & to let dentist know he is on chemotherapy.  Explained concerns of bleeding & infection but counts are OK for now.

## 2015-12-21 ENCOUNTER — Ambulatory Visit (HOSPITAL_BASED_OUTPATIENT_CLINIC_OR_DEPARTMENT_OTHER): Payer: BLUE CROSS/BLUE SHIELD

## 2015-12-21 ENCOUNTER — Ambulatory Visit (HOSPITAL_BASED_OUTPATIENT_CLINIC_OR_DEPARTMENT_OTHER): Payer: BLUE CROSS/BLUE SHIELD | Admitting: Nurse Practitioner

## 2015-12-21 ENCOUNTER — Ambulatory Visit (HOSPITAL_BASED_OUTPATIENT_CLINIC_OR_DEPARTMENT_OTHER): Payer: Self-pay | Admitting: Genetic Counselor

## 2015-12-21 ENCOUNTER — Encounter: Payer: Self-pay | Admitting: Genetic Counselor

## 2015-12-21 VITALS — BP 162/88 | HR 85 | Temp 97.8°F | Resp 20 | Ht 75.0 in | Wt >= 6400 oz

## 2015-12-21 DIAGNOSIS — Z5111 Encounter for antineoplastic chemotherapy: Secondary | ICD-10-CM | POA: Diagnosis not present

## 2015-12-21 DIAGNOSIS — C186 Malignant neoplasm of descending colon: Secondary | ICD-10-CM

## 2015-12-21 DIAGNOSIS — Z809 Family history of malignant neoplasm, unspecified: Secondary | ICD-10-CM

## 2015-12-21 DIAGNOSIS — E119 Type 2 diabetes mellitus without complications: Secondary | ICD-10-CM | POA: Diagnosis not present

## 2015-12-21 DIAGNOSIS — F1010 Alcohol abuse, uncomplicated: Secondary | ICD-10-CM

## 2015-12-21 DIAGNOSIS — Z860100 Personal history of colon polyps, unspecified: Secondary | ICD-10-CM

## 2015-12-21 DIAGNOSIS — I10 Essential (primary) hypertension: Secondary | ICD-10-CM | POA: Diagnosis not present

## 2015-12-21 DIAGNOSIS — I2510 Atherosclerotic heart disease of native coronary artery without angina pectoris: Secondary | ICD-10-CM | POA: Diagnosis not present

## 2015-12-21 DIAGNOSIS — Z800 Family history of malignant neoplasm of digestive organs: Secondary | ICD-10-CM

## 2015-12-21 DIAGNOSIS — Z808 Family history of malignant neoplasm of other organs or systems: Secondary | ICD-10-CM

## 2015-12-21 LAB — CBC WITH DIFFERENTIAL/PLATELET
BASO%: 0.2 % (ref 0.0–2.0)
Basophils Absolute: 0 10*3/uL (ref 0.0–0.1)
EOS ABS: 0.2 10*3/uL (ref 0.0–0.5)
EOS%: 2 % (ref 0.0–7.0)
HEMATOCRIT: 42.8 % (ref 38.4–49.9)
HEMOGLOBIN: 14.5 g/dL (ref 13.0–17.1)
LYMPH%: 27.4 % (ref 14.0–49.0)
MCH: 31.8 pg (ref 27.2–33.4)
MCHC: 33.9 g/dL (ref 32.0–36.0)
MCV: 93.9 fL (ref 79.3–98.0)
MONO#: 0.6 10*3/uL (ref 0.1–0.9)
MONO%: 7.3 % (ref 0.0–14.0)
NEUT%: 63.1 % (ref 39.0–75.0)
NEUTROS ABS: 5.4 10*3/uL (ref 1.5–6.5)
PLATELETS: 268 10*3/uL (ref 140–400)
RBC: 4.56 10*6/uL (ref 4.20–5.82)
RDW: 15.4 % — ABNORMAL HIGH (ref 11.0–14.6)
WBC: 8.6 10*3/uL (ref 4.0–10.3)
lymph#: 2.4 10*3/uL (ref 0.9–3.3)

## 2015-12-21 LAB — COMPREHENSIVE METABOLIC PANEL
ALBUMIN: 3.6 g/dL (ref 3.5–5.0)
ALK PHOS: 48 U/L (ref 40–150)
ALT: 32 U/L (ref 0–55)
ANION GAP: 10 meq/L (ref 3–11)
AST: 25 U/L (ref 5–34)
BILIRUBIN TOTAL: 0.6 mg/dL (ref 0.20–1.20)
BUN: 9.9 mg/dL (ref 7.0–26.0)
CALCIUM: 9.4 mg/dL (ref 8.4–10.4)
CO2: 24 meq/L (ref 22–29)
CREATININE: 0.8 mg/dL (ref 0.7–1.3)
Chloride: 107 mEq/L (ref 98–109)
EGFR: >90 mL/min/{1.73_m2} (ref >90)
Glucose: 215 mg/dl — ABNORMAL HIGH (ref 70–140)
Potassium: 4.3 mEq/L (ref 3.5–5.1)
Sodium: 140 mEq/L (ref 136–145)
TOTAL PROTEIN: 7.4 g/dL (ref 6.4–8.3)

## 2015-12-21 MED ORDER — HEPARIN SOD (PORK) LOCK FLUSH 100 UNIT/ML IV SOLN
500.0000 [IU] | Freq: Once | INTRAVENOUS | Status: DC | PRN
  Filled 2015-12-21: qty 5

## 2015-12-21 MED ORDER — DEXTROSE 5 % IV SOLN
Freq: Once | INTRAVENOUS | Status: AC
  Administered 2015-12-21: 11:00:00 via INTRAVENOUS

## 2015-12-21 MED ORDER — SODIUM CHLORIDE 0.9 % IV SOLN
8.0000 mg | Freq: Once | INTRAVENOUS | Status: AC
  Administered 2015-12-21: 8 mg via INTRAVENOUS
  Filled 2015-12-21: qty 0.8

## 2015-12-21 MED ORDER — PALONOSETRON HCL INJECTION 0.25 MG/5ML
0.2500 mg | Freq: Once | INTRAVENOUS | Status: AC
  Administered 2015-12-21: 0.25 mg via INTRAVENOUS

## 2015-12-21 MED ORDER — OXALIPLATIN CHEMO INJECTION 100 MG/20ML
130.0000 mg/m2 | Freq: Once | INTRAVENOUS | Status: AC
  Administered 2015-12-21: 400 mg via INTRAVENOUS
  Filled 2015-12-21: qty 80

## 2015-12-21 MED ORDER — SODIUM CHLORIDE 0.9% FLUSH
10.0000 mL | INTRAVENOUS | Status: DC | PRN
  Filled 2015-12-21: qty 10

## 2015-12-21 MED ORDER — PALONOSETRON HCL INJECTION 0.25 MG/5ML
INTRAVENOUS | Status: AC
  Filled 2015-12-21: qty 5

## 2015-12-21 NOTE — Progress Notes (Signed)
REFERRING PROVIDER: Truitt Merle, MD  PRIMARY PROVIDER:  Delman Cheadle, MD  PRIMARY REASON FOR VISIT:  1. Cancer of left colon ([place])   2. Family history of pancreatic cancer   3. History of colonic polyps   4. Family history of brain cancer   5. Family history of skin cancer   6. Family history of cancer      HISTORY OF PRESENT ILLNESS:   Chad [name], a 45 y.o. male, was seen for a [facility] cancer genetics consultation at the request of Dr. Burr Medico due to a personal history of colon cancer at 77 and family history of cancer.  Chad [name] presents to clinic today to discuss the possibility of a hereditary predisposition to cancer, genetic testing, and to further clarify his future cancer risks, as well as potential cancer risks for family members.   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  Immunohistochemistry was normal and microsatellite testing was MSI-Stable.  This was treated with left colectomy and adjuvant chemotherapy.    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   Staging form: Colon and Rectum, AJCC 7th Edition     Pathologic stage from 10/18/2015: Stage IIIA (T2, N1a, cM0) - Signed by Truitt Merle, MD on 11/15/2015       Cancer of left colon ([place])   05/11/2015 Imaging CT ABD/PELVIS: Gallstones w/sludge;liver prominent without focal liver lesion, mild thickening of bladder wall; negative for cancer   05/18/2015 Imaging [place] ABD: Negative   07/25/2015 Initial Diagnosis 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 8 mm descending polyp and 5 mm rectosigmoid polyp;18-29 mm proximal rectal polyp   07/25/2015 Tumor Marker CEA=0.7   07/26/2015  Pathology Results Sigmoid mass: invasive colorectal adenocarcinoma   10/22/2015 Pathologic Stage T2, N1, MO    #29 nodes examined w/ 1 postive node; moderately differentiated; Negative for Lymph-vascular and Peri-neural invasion; Microscopic extension into muscularis propria   10/26/2015 Pathology Results MSI Stable   11/30/2015 -  Chemotherapy Adjuvant chemotherapy with Capecitabine 2500 mg twice daily, on day 1-14, oxaliplatin 130 mg/m, every 21 days     RISK FACTORS:  Colonoscopy: yes; first colonoscopy in 07/2015 identified colon cancer and additional 3-4 polyps. Up to date with prostate exams:  Not assessed. Any excessive radiation exposure in the past:  no, but does report history of secondhand smoke exposure  Past Medical History  Diagnosis Date  . GERD (gastroesophageal reflux disease)   . Hypertension   . Diabetes mellitus without complication ([place]) 0/8657    oral agents only (07/2015)  . Gallstone 12/2014    GB stones and sludge on ultrasound and CT,  . Morbid obesity with BMI of 50.0-59.9, adult ([place]) 03/2013  . Fatty liver 12/2014    on ultrasound. pt obese and abuses ETOH  . ETOH abuse 2014  . Rectal bleeding 05/2015    07/2015 colonoscopy with malignant appearing sigmoid mass. Additional rectal, descending, polyps  . Coronary artery disease   . Shoulder pain, bilateral     due to previous injury  . Cancer [facility])     colon cancer    Past Surgical History  Procedure Laterality Date  . Cardiac catheterization N/A 04/06/2015  Procedure: Left Heart Cath and Coronary Angiography;  Surgeon: Belva Crome, MD;  Location: [place] [facility] CV LAB;  Service: Cardiovascular;  Laterality: N/A;  . Colonoscopy N/A 07/25/2015    Procedure: COLONOSCOPY;  Surgeon: Gatha Mayer, MD;  Location: [facility] ENDOSCOPY;  Service: Gastroenterology;  Laterality: N/A;  . Esophagogastroduodenoscopy N/A 07/25/2015    Procedure: ESOPHAGOGASTRODUODENOSCOPY (EGD);  Surgeon: Gatha Mayer, MD;  Location: [facility];  Service: Gastroenterology;  Laterality: N/A;  . Cystoscopy  10/18/2015    Procedure: CYSTOSCOPY FLEXIBLE URETHRAL DIALATION AND FOLEY PLACEMENT;  Surgeon: Alexis Frock, MD;  Location: [facility] ORS;  Service: Urology;;    Social History   Social History  . Marital Status: Legally Separated    Spouse Name: N/A  . Number of Children: 1  . Years of Education: N/A   Occupational History  . Manager    Social History Main Topics  . Smoking status: Never Smoker   . Smokeless tobacco: Current User     Comment: occasionally dips tobacco - has used for approx 20 yrs (12/21/15)  . Alcohol Use: Yes     Comment: heavy drinking for 10 years, 2-3  drinks of liqour a night, has been cutting back.    . Drug Use: No  . Sexual Activity: Not Asked   Other Topics Concern  . 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  Significant diagnoses are listed below: Family History  Problem Relation Age of Onset  . Diabetes Father   . Pancreatic cancer Father 66    former smoker, but had not smoked in 36 years  . Cancer Maternal Uncle     d. 51s; unknown type of cancer -metastatic throughout abdomen   . Other Brother     oldest brother has had a normal colonoscopy  . Heart attack Maternal Grandfather     d. 57-58; heavy smoker and drinker  . Heart attack Paternal Grandmother     d. early 9s  . Heart attack Paternal Grandfather 10  . Heart failure Maternal Uncle   . Skin cancer Maternal Uncle 42    NOS type; was out in the sun a lot  . Brain cancer Maternal Uncle     NOS type; mother's maternal half-brother dx. 56-68  . Cirrhosis Paternal Uncle   . Cancer Paternal Uncle     (x2-3) uncles with NOS cancers; d. 54s    Chad [name] has one daughter who is 36 years of age.  He has three full brothers and one full sister.  One brother  was hit by a drunk driver and died at the age of 5.  The three remaining siblings are in their early 66s and have not had cancer.  Chad [name] reports that his oldest brother has had a normal colonoscopy.  His mother is currently 70 and has not had cancer.  His father was diagnosed with pancreatic cancer at 62.  He was a former smoker, but had not smoked in 57 years.  His father passed away at 44 due to a heart attack following the surgery for his cancer.    Chad [name] mother had three full brothers and two maternal half siblings (one brother and sister).  One full brother is currently 29; he has a history of an unspecified  type of skin cancer, but he worked outside a lot.  One full brother died of heart failure in his 70s.  The remaining full brother died of metastatic cancer in his 57s.  Chad [name] reported that this was in his abdomen, but was not sure what the primary cancer was.  This uncle had three sons, one of whom is still alive and cancer free, the other two who died tragically in their teens (not health-related).  The half-brother died of an unspecified type of brain cancer at 54.  He has two children who are cancer-free.  The half-sister is currently 64 and has not had cancer.  Chad [name] maternal grandmother died of pneumonia-related complications around the age of 67.  His grandfather died of a heart attack at 57--he was a heavy smoker and drinker.  Chad [name] has no further information for any maternal great aunts/uncles or great grandparents.    Chad [name] father had eleven full brothers.  Chad [name] believes a few of them were diagnosed with unspecified types of cancer in their 8s.  One brother died of cirrhosis of the liver in his late 47s.  Three are in their mid-70s-early 80s and have not had cancer.  The remaining uncles passed away due to either heart-related causes or tobacco/alcohol-related causes and were all in their 67s or older.  Chad [name] is not aware of any cancer  history for any paternal first cousins, but he does report that some of them have died at young ages due to car accidents or gang violence.  His paternal grandmother died from a heart attack in her early 15s.  His grandfather died of a heart attack at 4.  He has no information for any paternal great aunts/uncles or great grandparents.  He reports no known family history of genetic testing for hereditary cancer.  Patient's maternal ancestors are of [place] descent, and paternal ancestors are of Caucasian descent (family from the [place], [place], and [place] areas). There is no reported Ashkenazi Jewish ancestry. There is no known consanguinity.  GENETIC COUNSELING ASSESSMENT: Chad [name] is a 45 y.o. male with a personal history of early-onset colon cancer which is somewhat suggestive of a hereditary colon cancer syndrome and predisposition to cancer. We, therefore, discussed and recommended the following at today's visit.   DISCUSSION: We reviewed the characteristics, features and inheritance patterns of hereditary cancer syndromes, particularly those caused by mutations within the Lynch syndrome, APC, and MUTYH genes. We also discussed genetic testing, including the appropriate family members to test, the process of testing, insurance coverage and turn-around-time for results. We discussed the implications of a negative, positive and/or variant of uncertain significant result. We recommended Chad [name] pursue genetic testing for the 19-gene Colorectal Cancer Panel with MSH2 Exons 1-7 Inversion Analysis through [facility].  The Colorectal Cancer Panel offered by [facility] includes sequencing and/or duplication/deletion testing of the following 19 genes: APC, ATM, AXIN2, BMPR1A, CDH1, CHEK2, EPCAM, MLH1, MSH2, MSH6, MUTYH, PMS2, POLD1, POLE, PTEN, SCG5/GREM1, SMAD4, STK11, and TP53.    Based on Chad [name] personal and family history of cancer, he meets medical criteria for  genetic testing. Despite that he meets criteria, he may still have an out of pocket cost. We discussed that if his out of pocket cost for testing is over $100, the laboratory will call and confirm whether he wants to proceed with testing.  If the out of pocket cost of testing is less than $100 he will be billed by  the genetic testing laboratory.   PLAN: After considering the risks, benefits, and limitations, Chad [name]  provided informed consent to pursue genetic testing and the blood sample was sent to [facility] for analysis of the 19-gene Colorectal Cancer Panel with MSH2 Exons 1-7 Inversion Analysis. Results should be available within approximately 2-3 weeks' time, at which point they will be disclosed by telephone to Chad [name], as will any additional recommendations warranted by these results. Chad [name] will receive a summary of his genetic counseling visit and a copy of his results once available. This information will also be available in Epic. We encouraged Chad [name] to remain in contact with cancer genetics annually so that we can continuously update the family history and inform him of any changes in cancer genetics and testing that may be of benefit for his family. Mr. Litle questions were answered to his satisfaction today. Our contact information was provided should additional questions or concerns arise.  Thank you for the referral and allowing [place] to share in the care of your patient.   Jeanine Luz, MS, [facility] Certified Genetic Counselor [email or web address] Phone: [phone]  The patient was seen for a total of 50 minutes in face-to-face genetic counseling.  This patient was discussed with Drs. Magrinat, Lindi Adie and/or Burr Medico who agrees with the above.    _______________________________________________________________________ For Office Staff:  Number of people involved in session: 1 Was an Intern/ student involved with case: no

## 2015-12-21 NOTE — Progress Notes (Signed)
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 8 mm descending polyp and 5 mm rectosigmoid polyp;18-29 mm proximal rectal polyp   07/25/2015 Tumor Marker CEA=0.7   07/26/2015 Pathology Results Sigmoid mass: invasive colorectal adenocarcinoma   10/22/2015 Pathologic Stage T2, N1, MO #29 nodes examined w/ 1 postive node; moderately differentiated; Negative for Lymph-vascular and Peri-neural invasion; Microscopic extension into muscularis propria   10/26/2015 Pathology Results MSI Stable             INTERVAL HISTORY:   Mr. Chad [name] returns as scheduled. He completed cycle 1 CAPOX beginning 11/30/2015. He had nausea beginning day 3. The nausea lasted about 1 week. Home nausea medication was mainly effective. He vomited on 2 occasions. No mouth sores. He developed diarrhea around day 4 or 5. He took Imodium. The diarrhea resolved within 2 days. Cold sensitivity lasted about 2 weeks. No numbness or tingling today.  Objective:  Vital signs in last 24 hours:  Blood  pressure 162/88, pulse 85, temperature 97.8 F (36.6 C), temperature source Oral, resp. rate 20, height '6\' 3"'  (1.905 m), weight 400 lb 12.8 oz (181.802 kg), SpO2 99 %.    HEENT: No thrush or ulcers. Resp: Lungs clear bilaterally. Cardio: Regular rate and rhythm. GI: Abdomen soft and nontender. No hepatomegaly. Vascular: No leg edema. Skin: No rash.    Lab Results:  Lab Results  Component Value Date   WBC 8.6 12/21/2015   HGB 14.5 12/21/2015   HCT 42.8 12/21/2015   MCV 93.9 12/21/2015   PLT 268 12/21/2015   NEUTROABS 5.4 12/21/2015    Imaging:  No results found.  Medications: I have reviewed the patient's current medications.  Assessment/Plan: 1. Cancer of left colon, Distal sigmoid, invasive adenocarcinoma, G2, pT2N1aM0, stage IIIA, MSI-stable;   CT scans 11/22/2015 with no evidence of metastatic disease   Cycle 1 adjuvant CAPOX beginning 11/30/2015  Cycle 2 adjuvant CAPOX beginning 12/21/2015 2. Genetics. Per NCCN guidelines due to his young age and family history of pancreatic cancer he is qualified for genetic testing to rule out inheritable genetic syndromes. He has been referred to the genetics counselor. 3. CT scan 11/22/2015 with a complex retroperitoneal fluid collection anterior to the aortic bifurcation measuring 8.6 x 6.1 x 9.1 cm. No air or contrast noted. Additional smaller component measuring 3.4 x 2.0 cm. Plan for repeat CT imaging at a 3-6 month interval as long as he remains asymptomatic. 4. Hypertension, diabetes, CAD, morbid obesity. 5. History of alcohol abuse. He has agreed to abstain from alcohol during the chemotherapy.   Disposition: Chad [name] appears stable. He has completed 1 cycle of adjuvant CAPOX. Plan to proceed with cycle 2 today as scheduled.  He had delayed nausea following cycle 1. We discussed adding Emend to the premedication regimen. He does not want to make any changes today. He understands to contact the office with poorly  controlled nausea.  He will return for a follow-up visit and cycle 3 CAPOX in 3 weeks. He will contact the office in the interim as outlined above or with any other problems.    Ned Card ANP/GNP-BC   12/21/2015  10:54 AM

## 2015-12-21 NOTE — Patient Instructions (Addendum)
[facility] Discharge Instructions for Patients Receiving Chemotherapy  Today you received the following chemotherapy agents: Oxaliplatin.  To help prevent nausea and vomiting after your treatment, we encourage you to take your nausea medication as directed. MAY TAKE ZOFRAN AFTER DAY 3 OF CHEMO EVERY 6HRS AS NEEDED. TAKE COMPAZINE IF NAUSEATED IN THE NEXT 3 DAYS INSTEAD.   If you develop nausea and vomiting that is not controlled by your nausea medication, call the clinic.   BELOW ARE SYMPTOMS THAT SHOULD BE REPORTED IMMEDIATELY:  *FEVER GREATER THAN 100.5 F  *CHILLS WITH OR WITHOUT FEVER  NAUSEA AND VOMITING THAT IS NOT CONTROLLED WITH YOUR NAUSEA MEDICATION  *UNUSUAL SHORTNESS OF BREATH  *UNUSUAL BRUISING OR BLEEDING  TENDERNESS IN MOUTH AND THROAT WITH OR WITHOUT PRESENCE OF ULCERS  *URINARY PROBLEMS  *BOWEL PROBLEMS  UNUSUAL RASH Items with * indicate a potential emergency and should be followed up as soon as possible.  Feel free to call the clinic you have any questions or concerns. The clinic phone number is [phone].  Please show the [facility] at check-in to the Emergency Department and triage nurse.  Oxaliplatin Injection What is this medicine? OXALIPLATIN (ox AL i PLA tin) is a chemotherapy drug. It targets fast dividing cells, like cancer cells, and causes these cells to die. This medicine is used to treat cancers of the colon and rectum, and many other cancers. This medicine may be used for other purposes; ask your health care provider or pharmacist if you have questions. What should I tell my health care provider before I take this medicine? They need to know if you have any of these conditions: -kidney disease -an unusual or allergic reaction to oxaliplatin, other chemotherapy, other medicines, foods, dyes, or preservatives -pregnant or trying to get pregnant -breast-feeding How should I use this medicine? This drug is given as an  infusion into a vein. It is administered in a hospital or clinic by a specially trained health care professional. Talk to your pediatrician regarding the use of this medicine in children. Special care may be needed. Overdosage: If you think you have taken too much of this medicine contact a poison control center or emergency room at once. NOTE: This medicine is only for you. Do not share this medicine with others. What if I miss a dose? It is important not to miss a dose. Call your doctor or health care professional if you are unable to keep an appointment. What may interact with this medicine? -medicines to increase blood counts like filgrastim, pegfilgrastim, sargramostim -probenecid -some antibiotics like amikacin, gentamicin, neomycin, polymyxin B, streptomycin, tobramycin -zalcitabine Talk to your doctor or health care professional before taking any of these medicines: -acetaminophen -aspirin -ibuprofen -ketoprofen -naproxen This list may not describe all possible interactions. Give your health care provider a list of all the medicines, herbs, non-prescription drugs, or dietary supplements you use. Also tell them if you smoke, drink alcohol, or use illegal drugs. Some items may interact with your medicine. What should I watch for while using this medicine? Your condition will be monitored carefully while you are receiving this medicine. You will need important blood work done while you are taking this medicine. This medicine can make you more sensitive to cold. Do not drink cold drinks or use ice. Cover exposed skin before coming in contact with cold temperatures or cold objects. When out in cold weather wear warm clothing and cover your mouth and nose to warm the air that  goes into your lungs. Tell your doctor if you get sensitive to the cold. This drug may make you feel generally unwell. This is not uncommon, as chemotherapy can affect healthy cells as well as cancer cells. Report any  side effects. Continue your course of treatment even though you feel ill unless your doctor tells you to stop. In some cases, you may be given additional medicines to help with side effects. Follow all directions for their use. Call your doctor or health care professional for advice if you get a fever, chills or sore throat, or other symptoms of a cold or flu. Do not treat yourself. This drug decreases your body's ability to fight infections. Try to avoid being around people who are sick. This medicine may increase your risk to bruise or bleed. Call your doctor or health care professional if you notice any unusual bleeding. Be careful brushing and flossing your teeth or using a toothpick because you may get an infection or bleed more easily. If you have any dental work done, tell your dentist you are receiving this medicine. Avoid taking products that contain aspirin, acetaminophen, ibuprofen, naproxen, or ketoprofen unless instructed by your doctor. These medicines may hide a fever. Do not become pregnant while taking this medicine. Women should inform their doctor if they wish to become pregnant or think they might be pregnant. There is a potential for serious side effects to an unborn child. Talk to your health care professional or pharmacist for more information. Do not breast-feed an infant while taking this medicine. Call your doctor or health care professional if you get diarrhea. Do not treat yourself. What side effects may I notice from receiving this medicine? Side effects that you should report to your doctor or health care professional as soon as possible: -allergic reactions like skin rash, itching or hives, swelling of the face, lips, or tongue -low blood counts - This drug may decrease the number of white blood cells, red blood cells and platelets. You may be at increased risk for infections and bleeding. -signs of infection - fever or chills, cough, sore throat, pain or difficulty passing  urine -signs of decreased platelets or bleeding - bruising, pinpoint red spots on the skin, black, tarry stools, nosebleeds -signs of decreased red blood cells - unusually weak or tired, fainting spells, lightheadedness -breathing problems -chest pain, pressure -cough -diarrhea -jaw tightness -mouth sores -nausea and vomiting -pain, swelling, redness or irritation at the injection site -pain, tingling, numbness in the hands or feet -problems with balance, talking, walking -redness, blistering, peeling or loosening of the skin, including inside the mouth -trouble passing urine or change in the amount of urine Side effects that usually do not require medical attention (report to your doctor or health care professional if they continue or are bothersome): -changes in vision -constipation -hair loss -loss of appetite -metallic taste in the mouth or changes in taste -stomach pain This list may not describe all possible side effects. Call your doctor for medical advice about side effects. You may report side effects to FDA at 1-800-FDA-1088. Where should I keep my medicine? This drug is given in a hospital or clinic and will not be stored at home. NOTE: This sheet is a summary. It may not cover all possible information. If you have questions about this medicine, talk to your doctor, pharmacist, or health care provider.    2016, Elsevier/Gold Standard. (2008-01-26 17:22:47)

## 2015-12-26 ENCOUNTER — Other Ambulatory Visit: Payer: Self-pay | Admitting: *Deleted

## 2015-12-26 DIAGNOSIS — C19 Malignant neoplasm of rectosigmoid junction: Secondary | ICD-10-CM

## 2015-12-26 MED ORDER — CAPECITABINE 500 MG PO TABS: 850.0000 mg/m2 | ORAL_TABLET | Freq: Two times a day (BID) | ORAL | Status: DC

## 2016-01-08 ENCOUNTER — Other Ambulatory Visit: Payer: Self-pay | Admitting: Family Medicine

## 2016-01-09 ENCOUNTER — Other Ambulatory Visit: Payer: Self-pay | Admitting: Family Medicine

## 2016-01-11 ENCOUNTER — Other Ambulatory Visit (HOSPITAL_BASED_OUTPATIENT_CLINIC_OR_DEPARTMENT_OTHER): Payer: BLUE CROSS/BLUE SHIELD

## 2016-01-11 ENCOUNTER — Ambulatory Visit (HOSPITAL_BASED_OUTPATIENT_CLINIC_OR_DEPARTMENT_OTHER): Payer: BLUE CROSS/BLUE SHIELD

## 2016-01-11 ENCOUNTER — Telehealth: Payer: Self-pay | Admitting: Hematology

## 2016-01-11 ENCOUNTER — Encounter: Payer: Self-pay | Admitting: Hematology

## 2016-01-11 ENCOUNTER — Ambulatory Visit (HOSPITAL_BASED_OUTPATIENT_CLINIC_OR_DEPARTMENT_OTHER): Payer: BLUE CROSS/BLUE SHIELD | Admitting: Hematology

## 2016-01-11 VITALS — BP 168/93 | HR 90 | Temp 99.4°F | Resp 18 | Ht 75.0 in | Wt >= 6400 oz

## 2016-01-11 DIAGNOSIS — E119 Type 2 diabetes mellitus without complications: Secondary | ICD-10-CM

## 2016-01-11 DIAGNOSIS — C186 Malignant neoplasm of descending colon: Secondary | ICD-10-CM

## 2016-01-11 DIAGNOSIS — F1021 Alcohol dependence, in remission: Secondary | ICD-10-CM

## 2016-01-11 DIAGNOSIS — Z5111 Encounter for antineoplastic chemotherapy: Secondary | ICD-10-CM

## 2016-01-11 DIAGNOSIS — R5383 Other fatigue: Secondary | ICD-10-CM

## 2016-01-11 DIAGNOSIS — I10 Essential (primary) hypertension: Secondary | ICD-10-CM | POA: Diagnosis not present

## 2016-01-11 DIAGNOSIS — E1165 Type 2 diabetes mellitus with hyperglycemia: Secondary | ICD-10-CM

## 2016-01-11 DIAGNOSIS — C19 Malignant neoplasm of rectosigmoid junction: Secondary | ICD-10-CM

## 2016-01-11 LAB — CBC WITH DIFFERENTIAL/PLATELET
BASO%: 0.4 % (ref 0.0–2.0)
BASOS ABS: 0 10*3/uL (ref 0.0–0.1)
EOS ABS: 0.2 10*3/uL (ref 0.0–0.5)
EOS%: 2 % (ref 0.0–7.0)
HCT: 44.1 % (ref 38.4–49.9)
HEMOGLOBIN: 14.8 g/dL (ref 13.0–17.1)
LYMPH%: 26.8 % (ref 14.0–49.0)
MCH: 31.8 pg (ref 27.2–33.4)
MCHC: 33.5 g/dL (ref 32.0–36.0)
MCV: 94.9 fL (ref 79.3–98.0)
MONO#: 0.7 10*3/uL (ref 0.1–0.9)
MONO%: 8.6 % (ref 0.0–14.0)
NEUT%: 62.2 % (ref 39.0–75.0)
NEUTROS ABS: 5.2 10*3/uL (ref 1.5–6.5)
PLATELETS: 213 10*3/uL (ref 140–400)
RBC: 4.64 10*6/uL (ref 4.20–5.82)
RDW: 18 % — ABNORMAL HIGH (ref 11.0–14.6)
WBC: 8.4 10*3/uL (ref 4.0–10.3)
lymph#: 2.2 10*3/uL (ref 0.9–3.3)

## 2016-01-11 LAB — COMPREHENSIVE METABOLIC PANEL
ALBUMIN: 3.3 g/dL — AB (ref 3.5–5.0)
ALK PHOS: 57 U/L (ref 40–150)
ALT: 32 U/L (ref 0–55)
ANION GAP: 13 meq/L — AB (ref 3–11)
AST: 27 U/L (ref 5–34)
BILIRUBIN TOTAL: 0.58 mg/dL (ref 0.20–1.20)
BUN: 9.8 mg/dL (ref 7.0–26.0)
CO2: 21 mEq/L — ABNORMAL LOW (ref 22–29)
Calcium: 9.1 mg/dL (ref 8.4–10.4)
Chloride: 105 mEq/L (ref 98–109)
Creatinine: 0.8 mg/dL (ref 0.7–1.3)
GLUCOSE: 242 mg/dL — AB (ref 70–140)
POTASSIUM: 3.6 meq/L (ref 3.5–5.1)
SODIUM: 139 meq/L (ref 136–145)
TOTAL PROTEIN: 7.1 g/dL (ref 6.4–8.3)

## 2016-01-11 MED ORDER — DEXTROSE 5 % IV SOLN
Freq: Once | INTRAVENOUS | Status: AC
  Administered 2016-01-11: 11:00:00 via INTRAVENOUS

## 2016-01-11 MED ORDER — PALONOSETRON HCL INJECTION 0.25 MG/5ML
0.2500 mg | Freq: Once | INTRAVENOUS | Status: AC
  Administered 2016-01-11: 0.25 mg via INTRAVENOUS

## 2016-01-11 MED ORDER — SODIUM CHLORIDE 0.9 % IV SOLN
8.0000 mg | Freq: Once | INTRAVENOUS | Status: AC
  Administered 2016-01-11: 8 mg via INTRAVENOUS
  Filled 2016-01-11: qty 0.8

## 2016-01-11 MED ORDER — PALONOSETRON HCL INJECTION 0.25 MG/5ML
INTRAVENOUS | Status: AC
  Filled 2016-01-11: qty 5

## 2016-01-11 MED ORDER — DEXTROSE 5 % IV SOLN
130.0000 mg/m2 | Freq: Once | INTRAVENOUS | Status: AC
  Administered 2016-01-11: 400 mg via INTRAVENOUS
  Filled 2016-01-11: qty 80

## 2016-01-11 NOTE — Patient Instructions (Signed)
[facility] Discharge Instructions for Patients Receiving Chemotherapy  Today you received the following chemotherapy agents Oxaliplatin  To help prevent nausea and vomiting after your treatment, we encourage you to take your nausea medication as directed.   If you develop nausea and vomiting that is not controlled by your nausea medication, call the clinic.   BELOW ARE SYMPTOMS THAT SHOULD BE REPORTED IMMEDIATELY:  *FEVER GREATER THAN 100.5 F  *CHILLS WITH OR WITHOUT FEVER  NAUSEA AND VOMITING THAT IS NOT CONTROLLED WITH YOUR NAUSEA MEDICATION  *UNUSUAL SHORTNESS OF BREATH  *UNUSUAL BRUISING OR BLEEDING  TENDERNESS IN MOUTH AND THROAT WITH OR WITHOUT PRESENCE OF ULCERS  *URINARY PROBLEMS  *BOWEL PROBLEMS  UNUSUAL RASH Items with * indicate a potential emergency and should be followed up as soon as possible.  Feel free to call the clinic you have any questions or concerns. The clinic phone number is [phone].  Please show the CHEMO ALERT CARD at check-in to the Emergency Department and triage nurse.    

## 2016-01-11 NOTE — Telephone Encounter (Signed)
Gave and printed appt sched and avs for pt July

## 2016-01-11 NOTE — Progress Notes (Signed)
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   Staging form: Colon and Rectum, AJCC 7th Edition     Pathologic stage from 10/18/2015: Stage IIIA (T2, N1a, cM0) - Signed by Chad Merle, MD on 11/15/2015       Cancer of left colon ([place])   05/11/2015 Imaging CT ABD/PELVIS: Gallstones w/sludge;liver prominent without focal liver lesion, mild thickening of bladder wall; negative for cancer   05/18/2015 Imaging [place] ABD: Negative   07/25/2015 Initial Diagnosis 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 8 mm descending polyp and 5 mm rectosigmoid polyp;18-29 mm proximal rectal polyp   07/25/2015 Tumor Marker CEA=0.7   07/26/2015 Pathology Results Sigmoid mass: invasive colorectal adenocarcinoma   10/22/2015 Pathologic Stage T2, N1, MO    #29 nodes examined w/ 1 postive node; moderately differentiated; Negative for Lymph-vascular and Peri-neural invasion; Microscopic extension into muscularis propria   10/26/2015 Pathology Results MSI Stable   11/30/2015 -  Chemotherapy Adjuvant chemotherapy with Capecitabine 2500 mg twice daily, on day 1-14, oxaliplatin 130 mg/m, every 21 days    HISTORY OF PRESENTING ILLNESS:  Chad [name] 45 y.o. male is here because of recently diagnosed colon cancer.  He is  accompanied by his friend to the clinic today.  He presented with abdominal pain since Oct 2016,  Was seen in the emergency room on 05/11/2015,  CT abdomen and pelvis showed  Mild thickening of the urinary bladder wall, gallstones, otherwise negative. He was seen by her primary care physician Dr. Brigitte [name] afterwards, who referred him to GI Dr. Carlean [name].  His father passed away from [place] cancer around that time,  So his appointment was postponed, and he finally had colonoscopy in early January 2017, which showed multiple polyps, and  A ulcerated mass in distal sigmoid colon.  He was subsequently referred to colorectal surgeon Dr. Johney [name], and  Eventually had left colectomy  On 11/01/2015.  He tolerated surgery well, and recovered well. He has good appetite good, BM is normal most of time, occasional diarrhea, likely second to food, mild low abdomen pain, which has improves, he lost about 30 lbs before surgery and 10lbs after surgery.   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  He is divorced, he has a 4 year old daughter who lives with her mother in [place]. He has sisters and brothers, and her mother who live close by.   CURRENT THERAPY: Adjuvant chemotherapy with Capecitabine 2500 mg twice daily, on day 1-14, oxaliplatin 130 mg/m, every 21 days, started on 11/30/2015  INTERIM HISTORY: Chad [name] returns for follow-up and chemo. He has been quite fatigued since he started chemo, he is still able to work full-time, but is exhausted afterwards. He also has significant cold sensitivity and tingling of hands after chemotherapy, which lasted about 9-10 days and resolves completely. He  also noticed bilateral calf pain for a few days after chemotherapy. No significant nausea, diarrhea, or anorexia. His blood glucose has been high in the 200 range lately, he ran out of his blood pressure medication for the past week, he will pick up at his pharmacy today and will  restart. He is can see his family care physician Dr. Brigitte [name] soon.  MEDICAL HISTORY:  Past Medical History  Diagnosis Date  . GERD (gastroesophageal reflux disease)   . Hypertension   . Diabetes mellitus without complication ([place]) 0/0923    oral agents only (07/2015)  . Gallstone 12/2014    GB stones and sludge on ultrasound and CT,  . Morbid obesity with BMI of 50.0-59.9, adult ([place]) 03/2013  . Fatty liver 12/2014    on ultrasound. pt obese and abuses ETOH  . ETOH abuse 2014  . Rectal bleeding 05/2015    07/2015 colonoscopy with malignant appearing sigmoid mass. Additional rectal, descending, polyps  . Coronary artery disease   . Shoulder pain, bilateral     due to previous injury  . Cancer [facility])     colon cancer    SURGICAL HISTORY: Past Surgical History  Procedure Laterality Date  . Cardiac catheterization N/A 04/06/2015    Procedure: Left Heart Cath and Coronary Angiography;  Surgeon: Belva Crome, MD;  Location: [place] [facility];  Service: Cardiovascular;  Laterality: N/A;  . Colonoscopy N/A 07/25/2015    Procedure: COLONOSCOPY;  Surgeon: Chad Mayer, MD;  Location: [facility];  Service: Gastroenterology;  Laterality: N/A;  . Esophagogastroduodenoscopy N/A 07/25/2015    Procedure: ESOPHAGOGASTRODUODENOSCOPY (EGD);  Surgeon: Chad Mayer, MD;  Location: [facility];  Service: Gastroenterology;  Laterality: N/A;  . Cystoscopy  10/18/2015    Procedure: CYSTOSCOPY FLEXIBLE URETHRAL DIALATION AND FOLEY PLACEMENT;  Surgeon: Chad Frock, MD;  Location: [facility];  Service: Urology;;    SOCIAL HISTORY: Social History   Social History  . Marital Status: Legally Separated    Spouse Name: N/A  . Number of Children: 1  . Years of Education: N/A   Occupational History  . Manager    Social History Main Topics  . Smoking status: Never Smoker   . Smokeless tobacco: Current User     Comment: occasionally dips tobacco - has used for approx 20 yrs (12/21/15)  . Alcohol Use: Yes      Comment: heavy drinking for 10 years, 2-3  drinks of liqour a night, has been cutting back.    . Drug Use: No  . Sexual Activity: Not on file   Other Topics Concern  . Not on file   Social History Narrative   Divorced, has 109 yo daughter lives in [place]   Lives alone   Manages chain of car wash dealers       FAMILY HISTORY: Family History  Problem Relation Age of Onset  . Diabetes Father   . Pancreatic cancer Father 61    former smoker, but had not smoked in 15 years  . Cancer Maternal Uncle     d. 43s; unknown type of cancer -metastatic throughout abdomen   . Other Brother     oldest brother has had a normal colonoscopy  . Heart attack Maternal Grandfather     d. 57-58; heavy smoker and drinker  . Heart attack Paternal Grandmother     d. early 76s  . Heart attack Paternal Grandfather 68  . Heart failure Maternal Uncle   . Skin cancer Maternal Uncle 63  NOS type; was out in the sun a lot  . Brain cancer Maternal Uncle     NOS type; mother's maternal half-brother dx. 44-68  . Cirrhosis Paternal Uncle   . Cancer Paternal Uncle     (x2-3) uncles with NOS cancers; d. 78s    ALLERGIES:  has No Known Allergies.  MEDICATIONS:  Current Outpatient Prescriptions  Medication Sig Dispense Refill  . Ascorbic Acid (VITAMIN C) 1000 MG tablet Take 1,000 mg by mouth daily.    Marland Kitchen aspirin EC 81 MG tablet Take 1 tablet (81 mg total) by mouth daily. DO NOT TAKE AGAIN UNTIL 08/09/15    . capecitabine (XELODA) 500 MG tablet Take 5 tablets (2,500 mg total) by mouth 2 (two) times daily after a meal. 14 days on & 7 days off 140 tablet 2  . carvedilol (COREG) 6.25 MG tablet Take 1 tablet (6.25 mg total) by mouth 2 (two) times daily with a meal. (Patient taking differently: Take 6.25 mg by mouth every morning. ) 180 tablet 1  . glipiZIDE (GLUCOTROL XL) 5 MG 24 hr tablet TAKE 1 TABLET(5 MG) BY MOUTH DAILY WITH BREAKFAST 90 tablet 0  . lisinopril-hydrochlorothiazide (PRINZIDE,ZESTORETIC) 20-25 MG  tablet TAKE 1 TABLET BY MOUTH DAILY 90 tablet 0  . LORazepam (ATIVAN) 1 MG tablet TAKE 1 TABLET BY MOUTH EVERY NIGHT AT BEDTIME (Patient taking differently: TAKE 1 TABLET at bedtime -  patient uses as needed for nausea) 30 tablet 1  . metFORMIN (GLUCOPHAGE-XR) 500 MG 24 hr tablet Take 4 tablets once a day WITH BREAKFAST 360 tablet 3  . omeprazole (PRILOSEC) 40 MG capsule Take 1 capsule (40 mg total) by mouth daily. 90 capsule 3  . ondansetron (ZOFRAN) 8 MG tablet Take 1 tablet (8 mg total) by mouth 2 (two) times daily as needed for refractory nausea / vomiting. Start on day 3 after chemotherapy. 30 tablet 1  . oxyCODONE (OXY IR/ROXICODONE) 5 MG immediate release tablet Take 1-2 tablets (5-10 mg total) by mouth every 4 (four) hours as needed for moderate pain, severe pain or breakthrough pain. 40 tablet 0  . prochlorperazine (COMPAZINE) 10 MG tablet Take 1 tablet (10 mg total) by mouth every 6 (six) hours as needed (Nausea or vomiting). 30 tablet 1   No current facility-administered medications for this visit.    REVIEW OF SYSTEMS:   Constitutional: Denies fevers, chills or abnormal night sweats Eyes: Denies blurriness of vision, double vision or watery eyes Ears, nose, mouth, throat, and face: Denies mucositis or sore throat Respiratory: Denies cough, dyspnea or wheezes Cardiovascular: Denies palpitation, chest discomfort or lower extremity swelling Gastrointestinal:  Denies nausea, heartburn or change in bowel habits Skin: Denies abnormal skin rashes Lymphatics: Denies new lymphadenopathy or easy bruising Neurological:Denies numbness, tingling or new weaknesses Behavioral/Psych: Mood is stable, no new changes  All other systems were reviewed with the patient and are negative.  PHYSICAL EXAMINATION: ECOG PERFORMANCE STATUS: 1 - Symptomatic but completely ambulatory  Filed Vitals:   01/11/16 1010  BP: 168/93  [name]: 90  Temp: 99.4 F (37.4 C)  Resp: 18   Filed Weights   01/11/16  1010  Weight: 406 lb 9.6 oz (184.433 kg)    GENERAL:alert, no distress and comfortable,  Morbidly obese. SKIN: skin color, texture, turgor are normal, no rashes or significant lesions EYES: normal, conjunctiva are pink and non-injected, sclera clear OROPHARYNX:no exudate, no erythema and lips, buccal mucosa, and tongue normal  NECK: supple, thyroid normal size, non-tender, without nodularity LYMPH:  no palpable lymphadenopathy in the cervical, axillary or inguinal LUNGS: clear to auscultation and percussion with normal breathing effort HEART: regular rate & rhythm and no murmurs and no lower extremity edema ABDOMEN:abdomen soft,  Surgical incision and laparoscopic scars are well-healed. non-tender and normal bowel sounds Musculoskeletal:no cyanosis of digits and no clubbing  PSYCH: alert & oriented x 3 with fluent speech NEURO: no focal motor/sensory deficits  LABORATORY DATA:  I have reviewed the data as listed CBC Latest Ref Rng 01/11/2016 12/21/2015 12/13/2015  WBC 4.0 - 10.3 10e3/uL 8.4 8.6 10.3  Hemoglobin 13.0 - 17.1 g/dL 14.8 14.5 14.7  Hematocrit 38.4 - 49.9 % 44.1 42.8 43.2  Platelets 140 - 400 10e3/uL 213 268 279    CMP Latest Ref Rng 01/11/2016 12/21/2015 12/13/2015  Glucose 70 - 140 mg/dl 242(H) 215(H) 254(H)  BUN 7.0 - 26.0 mg/dL 9.8 9.9 13.7  Creatinine 0.7 - 1.3 mg/dL 0.8 0.8 0.9  Sodium 136 - 145 mEq/L 139 140 140  Potassium 3.5 - 5.1 mEq/L 3.6 4.3 4.5  CO2 22 - 29 mEq/L 21(L) 24 24  Calcium 8.4 - 10.4 mg/dL 9.1 9.4 9.4  Total Protein 6.4 - 8.3 g/dL 7.1 7.4 7.2  Total Bilirubin 0.20 - 1.20 mg/dL 0.58 0.60 0.49  Alkaline Phos 40 - 150 U/L 57 48 45  AST 5 - 34 U/L '27 25 29  ' ALT 0 - 55 U/L 32 32 40    PATHOLOGY REPORT: Diagnosis 10/18/2015 1. Colon, segmental resection for tumor, rectosigmoid INFILTRATIVE MODERATELY DIFFERENTIATED ADENOCARCINOMA OF THE COLON (3.8 CM) THE TUMOR INVADES MUSCULARIS PROPRIA MARGINS OF RESECTION ARE NEGATIVE FOR TUMOR METASTATIC COLONIC  ADENOCARCINOMA IN ONE OF TWENTY-NINE LYMPH NODES (1/29) 2. Colon, resection margin (donut), final distal margin BENIGN COLONIC TISSUE, NEGATIVE FOR CARCINOMA Microscopic Comment 1. COLON AND RECTUM (INCLUDING TRANS-ANAL RESECTION): Specimen: Rectosigmoid colon Procedure: Segmental resection Tumor site: Rectal colon Specimen integrity: Intact Macroscopic intactness of mesorectum: Not applicable: NA Complete: NA Near complete: x Incomplete: NA Cannot be determined (specify): NA Macroscopic tumor perforation: Muscularis propria Invasive tumor: Maximum size: 3.8 cm Histologic type(s): Adenocarcinoma Histologic grade and differentiation: G2 G1: well differentiated/low grade G2: moderately differentiated/low grade G3: poorly differentiated/high grade G4: undifferentiated/high grade Type of polyp in which invasive carcinoma arose: Tubular adenoma Microscopic extension of invasive tumor:Muscularis propria Lymph-Vascular invasion: Negative Peri-neural invasion: Negative Tumor deposit(s) (discontinuous extramural extension): Negative Resection margins: Proximal margin: Negative Distal margin: Negative Circumferential (radial) (posterior ascending, posterior descending; lateral and posterior mid-rectum; and entire lower 1/3 rectum):Negative Mesenteric margin (sigmoid and transverse): Negative Distance closest margin (if all above margins negative): 4.8 cm Trans-anal resection margins only: Deep margin: NA Mucosal Margin: NA Distance closest mucosal margin (if negative): NA Treatment effect (neo-adjuvant therapy): NA Additional polyp(s): Hyperplastic poly Non-neoplastic findings: Unremarkable Lymph nodes: number examined 29; number positive: 1 Pathologic Staging: T2, N1, M0   Mismatch Repair (MMR) Protein Immunohistochemistry (IHC) IHC Expression Result: MLH1: Preserved nuclear expression (greater 50% tumor expression) MSH2: Preserved nuclear expression (greater 50% tumor  expression) MSH6: Preserved nuclear expression (greater 50% tumor expression) PMS2: Preserved nuclear expression (greater 50% tumor expression) * Internal control demonstrates intact nuclear expression Interpretation: NORMAL     Diagnosis 07/25/2015 1. Colon, polyp(s), descending - TUBULAR ADENOMA. NO HIGH GRADE DYSPLASIA OR MALIGNANCY IDENTIFIED. 2. Colon, biopsy, distal sigmoid mass - INVASIVE COLORECTAL ADENOCARCINOMA. 3. Colon, polyp(s), small recto sigmoid - HYPERPLASTIC POLYP. NO ADENOMATOUS CHANGE OR MALIGNANCY. 4. Rectum, polyp(s) - TUBULOVILLOUS ADENOMA. NO HIGH GRADE DYSPLASIA OR MALIGNANCY IDENTIFIED.  RADIOGRAPHIC STUDIES: I have personally reviewed the radiological images as listed and agreed with the findings in the report.  CT chest, abdomen and pelvis w contrast 11/22/2015 IMPRESSION: 1. No definite signs of metastatic disease within the chest, abdomen or pelvis. 2. Hepatic evaluation is limited by the limited contrast bolus and heterogeneous hepatic steatosis. MRI of the liver could be performed for more definitive evaluation if clinically warranted. 3. Presumed postoperative retroperitoneal fluid collections adjacent to sigmoid colon anastomosis. The contrast has not yet passed through the anastomosis, although there is no air in the collections to suggest an ongoing leak.    COLONOSCOPY 07/25/2015 ENDOSCOPIC IMPRESSION: 1) 1/4 circumference firm ulcerated mass in distal sigmmoid - approx 22 cm from verge. Looks like cancer. Biopsied. 2) 8 mm descending polyp and 5 mm rectosigmoid polyp removed cold snare, completely recovered and sent to path. 3) 18-20 mm pedunculated proximal rectal polyp removed hot snare and completely recovered for pathology. 4) Otherwise normal colonoscopy RECOMMENDATIONS: 1. Hold Aspirin and all other NSAIDS for 2 weeks. 2. Will call pathology results and plans though anticipate surgical referral after pathology in. CEA will be  drawn at hospital today. He has had recent CT's of chest, and abd/pelvis - will see if he needs new ones - he may.  EGD 07/25/2015 ENDOSCOPIC IMPRESSION: Normal appearing esophagus and GE junction, the stomach was well visualized and normal in appearance, normal appearing duodenum  ASSESSMENT & PLAN:  45 year old Caucasian male, with past medical history of heavy alcohol drinking, hypertension, diabetes, coronary artery disease, presented with abdominal pain.  1.  Cancer of left colon, Distal sigmoid, invasive adenocarcinoma, G2, pT2N1aM0, stage IIIA, MSI-stable -I reviewed his CT scan findings, and the surgical pathology findings include details with patient. -he has locally advanced sigmoid colon cancer, with one out of 29 lymph nodes positive. -We discussed the risk of cancer recurrence after's complete surgical resection. Giving his stage IIIa disease, he is at moderate to high risk for recurrence. -His initial staging CT scan was done 6 months ago, I recommend him to have a repeat a CT chest, abdomen and pelvis with contrast for restaging, which was negative  -We discussed the standard care for stage III colon cancer is adjuvant chemotherapy. Given his young age, I recommend combined chemotherapy, FOLFOX or CAPEOX. Due to his work schedule, he opted CAPEOX. -We discussed the new data from pooled 6 phase 3 trial investigating duration of adjuvant oxaliplatin-based therapy (3 versus 6 months) for stage III colon cancer which was reported in ASCO 2017 recently, showed similar three-year disease-free survival of 3 months versus 6 months in patients with T1-3N1 disease. Based on the new data, I recommend him to have 3 months (instead of 6 months) CAPEOX, he is very happy to hear the news.  -He is tolerating chemotherapy moderately well, has moderate fatigue and cold sensitivity. Lab results reviewed, adequate for treatment, we'll proceed to cycle 3 today -Due to his vacation schedule, he would like  to postpone his next treatment for one week.  2. Genetics -per NCCN guideline, due to his young age and family history of pancreatic cancer, he is qualified for genetic testing to ruled out inheritable genetic syndromes. -He was seen by our genetic counselor, the genetic testing results are still pending  3. HTN, DM, CAD, morbid obesity -he will continue follow-up with his primary care physician -We will monitor his blood pressure, glucose closely during the chemotherapy, and may need to adjust his the medication if needed. -I strongly  encouraged him to see Dr. Brigitte [name] to adjust his diabetic medication due to his uncontrolled hyperglycemia -He will pick up his blood pressure medication today and restart  4. History of alcohol abuse -He has stopped alcohol completely since he started chemotherapy.   5. Fatigue and neuropathy -Second to chemotherapy. Neuropathy resolved now -Nausea management was reviewed with him again. -We'll monitor his neuropathy closely.  Plan -Lab results reviewed, we'll proceed cycle 3 chemotherapy today -He will return in 4 weeks for follow-up and cycle 4 (last cycle ) chemo   All questions were answered. The patient knows to call the clinic with any problems, questions or concerns. I spent 20 minutes counseling the patient face to face. The total time spent in the appointment was 25 minutes and more than 50% was on counseling.     Chad Merle, MD 01/11/2016 10:31 AM

## 2016-01-19 ENCOUNTER — Telehealth: Payer: Self-pay | Admitting: Genetic Counselor

## 2016-01-19 NOTE — Telephone Encounter (Signed)
Discussed with Mr. Chad [name] that his genetic test result was negative for known pathogenic mutations within any of 19 genes on the Colorectal Cancer Panel through [facility].  One uncertain change was found on one copy of the APC gene.  Discussed that we treat this just like negative test result until it gets updated by the lab and reviewed why we do that.  Encouraged him to keep his phone number up-to-date with [place], so that we can call and let him know if/when this gets updated in the future.  Recommended he continue to follow his doctors' recommendations for future cancer screening.  His brothers and sister should be having colonscopies now.  His daughter can have her first colonoscopy at age 75 based on his diagnosis at age 57.  Chad [name] is welcome to call with any questions and to update the family history with [place] in the future.

## 2016-01-23 ENCOUNTER — Ambulatory Visit: Payer: Self-pay | Admitting: Genetic Counselor

## 2016-01-23 DIAGNOSIS — C186 Malignant neoplasm of descending colon: Secondary | ICD-10-CM

## 2016-01-23 DIAGNOSIS — Z1379 Encounter for other screening for genetic and chromosomal anomalies: Secondary | ICD-10-CM

## 2016-01-23 DIAGNOSIS — Z800 Family history of malignant neoplasm of digestive organs: Secondary | ICD-10-CM

## 2016-01-23 DIAGNOSIS — Z809 Family history of malignant neoplasm, unspecified: Secondary | ICD-10-CM

## 2016-02-08 ENCOUNTER — Ambulatory Visit (HOSPITAL_BASED_OUTPATIENT_CLINIC_OR_DEPARTMENT_OTHER): Payer: BLUE CROSS/BLUE SHIELD

## 2016-02-08 ENCOUNTER — Other Ambulatory Visit (HOSPITAL_BASED_OUTPATIENT_CLINIC_OR_DEPARTMENT_OTHER): Payer: BLUE CROSS/BLUE SHIELD

## 2016-02-08 ENCOUNTER — Encounter: Payer: Self-pay | Admitting: Hematology

## 2016-02-08 ENCOUNTER — Ambulatory Visit (HOSPITAL_BASED_OUTPATIENT_CLINIC_OR_DEPARTMENT_OTHER): Payer: BLUE CROSS/BLUE SHIELD | Admitting: Hematology

## 2016-02-08 ENCOUNTER — Telehealth: Payer: Self-pay | Admitting: Hematology

## 2016-02-08 VITALS — BP 163/88 | HR 86 | Temp 99.0°F | Resp 21 | Ht 75.0 in | Wt >= 6400 oz

## 2016-02-08 DIAGNOSIS — I10 Essential (primary) hypertension: Secondary | ICD-10-CM

## 2016-02-08 DIAGNOSIS — C187 Malignant neoplasm of sigmoid colon: Secondary | ICD-10-CM

## 2016-02-08 DIAGNOSIS — E119 Type 2 diabetes mellitus without complications: Secondary | ICD-10-CM

## 2016-02-08 DIAGNOSIS — C186 Malignant neoplasm of descending colon: Secondary | ICD-10-CM

## 2016-02-08 DIAGNOSIS — R530 Neoplastic (malignant) related fatigue: Secondary | ICD-10-CM | POA: Diagnosis not present

## 2016-02-08 DIAGNOSIS — I2510 Atherosclerotic heart disease of native coronary artery without angina pectoris: Secondary | ICD-10-CM

## 2016-02-08 DIAGNOSIS — G620 Drug-induced polyneuropathy: Secondary | ICD-10-CM | POA: Diagnosis not present

## 2016-02-08 DIAGNOSIS — Z5111 Encounter for antineoplastic chemotherapy: Secondary | ICD-10-CM | POA: Diagnosis not present

## 2016-02-08 DIAGNOSIS — C19 Malignant neoplasm of rectosigmoid junction: Secondary | ICD-10-CM

## 2016-02-08 DIAGNOSIS — F1021 Alcohol dependence, in remission: Secondary | ICD-10-CM

## 2016-02-08 DIAGNOSIS — E1165 Type 2 diabetes mellitus with hyperglycemia: Secondary | ICD-10-CM

## 2016-02-08 LAB — COMPREHENSIVE METABOLIC PANEL
ALBUMIN: 3.6 g/dL (ref 3.5–5.0)
ALK PHOS: 62 U/L (ref 40–150)
ALT: 31 U/L (ref 0–55)
ANION GAP: 12 meq/L — AB (ref 3–11)
AST: 25 U/L (ref 5–34)
BUN: 11.9 mg/dL (ref 7.0–26.0)
CO2: 22 meq/L (ref 22–29)
Calcium: 9.6 mg/dL (ref 8.4–10.4)
Chloride: 102 mEq/L (ref 98–109)
Creatinine: 0.8 mg/dL (ref 0.7–1.3)
GLUCOSE: 271 mg/dL — AB (ref 70–140)
POTASSIUM: 4.4 meq/L (ref 3.5–5.1)
SODIUM: 137 meq/L (ref 136–145)
Total Bilirubin: 0.72 mg/dL (ref 0.20–1.20)
Total Protein: 7.6 g/dL (ref 6.4–8.3)

## 2016-02-08 LAB — CBC WITH DIFFERENTIAL/PLATELET
BASO%: 0.2 % (ref 0.0–2.0)
BASOS ABS: 0 10*3/uL (ref 0.0–0.1)
EOS ABS: 0.2 10*3/uL (ref 0.0–0.5)
EOS%: 2 % (ref 0.0–7.0)
HCT: 42.9 % (ref 38.4–49.9)
HGB: 14.9 g/dL (ref 13.0–17.1)
LYMPH%: 31.7 % (ref 14.0–49.0)
MCH: 33.4 pg (ref 27.2–33.4)
MCHC: 34.7 g/dL (ref 32.0–36.0)
MCV: 96.2 fL (ref 79.3–98.0)
MONO#: 0.9 10*3/uL (ref 0.1–0.9)
MONO%: 10.4 % (ref 0.0–14.0)
NEUT#: 4.8 10*3/uL (ref 1.5–6.5)
NEUT%: 55.7 % (ref 39.0–75.0)
Platelets: 185 10*3/uL (ref 140–400)
RBC: 4.46 10*6/uL (ref 4.20–5.82)
RDW: 16.5 % — ABNORMAL HIGH (ref 11.0–14.6)
WBC: 8.5 10*3/uL (ref 4.0–10.3)
lymph#: 2.7 10*3/uL (ref 0.9–3.3)

## 2016-02-08 MED ORDER — OXALIPLATIN CHEMO INJECTION 100 MG/20ML
130.0000 mg/m2 | Freq: Once | INTRAVENOUS | Status: AC
  Administered 2016-02-08: 400 mg via INTRAVENOUS
  Filled 2016-02-08: qty 80

## 2016-02-08 MED ORDER — PALONOSETRON HCL INJECTION 0.25 MG/5ML
INTRAVENOUS | Status: AC
  Filled 2016-02-08: qty 5

## 2016-02-08 MED ORDER — PROCHLORPERAZINE MALEATE 10 MG PO TABS
10.0000 mg | ORAL_TABLET | Freq: Once | ORAL | Status: AC
  Administered 2016-02-08: 10 mg via ORAL

## 2016-02-08 MED ORDER — PALONOSETRON HCL INJECTION 0.25 MG/5ML
0.2500 mg | Freq: Once | INTRAVENOUS | Status: AC
  Administered 2016-02-08: 0.25 mg via INTRAVENOUS

## 2016-02-08 MED ORDER — DEXAMETHASONE SODIUM PHOSPHATE 100 MG/10ML IJ SOLN
4.0000 mg | Freq: Once | INTRAMUSCULAR | Status: AC
  Administered 2016-02-08: 4 mg via INTRAVENOUS
  Filled 2016-02-08: qty 0.4

## 2016-02-08 MED ORDER — PROCHLORPERAZINE MALEATE 10 MG PO TABS
ORAL_TABLET | ORAL | Status: AC
  Filled 2016-02-08: qty 1

## 2016-02-08 MED ORDER — DEXTROSE 5 % IV SOLN
Freq: Once | INTRAVENOUS | Status: AC
  Administered 2016-02-08: 11:00:00 via INTRAVENOUS

## 2016-02-08 NOTE — Telephone Encounter (Signed)
Gave pt cal & avs °

## 2016-02-08 NOTE — Progress Notes (Signed)
1355: Pt reported nausea. Denied any other symptoms. Discussed with Dr. Burr Medico, order received for Compazine 10 mg PO.  Compazine administered, pt declined to stay for re-assessment. He had someone waiting to pick him up. Pt understands to call office for nausea that does not respond to antiemetics.

## 2016-02-08 NOTE — Addendum Note (Signed)
Addended by: Truitt Merle on: 02/08/2016 11:08 AM   Modules accepted: Orders

## 2016-02-08 NOTE — Progress Notes (Signed)
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   Staging form: Colon and Rectum, AJCC 7th Edition     Pathologic stage from 10/18/2015: Stage IIIA (T2, N1a, cM0) - Signed by Truitt Merle, MD on 11/15/2015       Cancer of left colon ([place])   05/11/2015 Imaging    CT ABD/PELVIS: Gallstones w/sludge;liver prominent without focal liver lesion, mild thickening of bladder wall; negative for cancer     05/18/2015 Imaging    [place] ABD: Negative     07/25/2015 Initial Diagnosis    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 8 mm descending polyp and 5 mm rectosigmoid polyp;18-29 mm proximal rectal polyp     07/25/2015 Tumor Marker    CEA=0.7     07/26/2015 Pathology Results    Sigmoid mass: invasive colorectal adenocarcinoma     10/22/2015 Pathologic Stage    T2, N1, MO    #29 nodes examined w/ 1 postive node; moderately differentiated; Negative for Lymph-vascular and Peri-neural invasion; Microscopic extension into muscularis propria     10/26/2015 Pathology Results    MSI Stable     11/30/2015 -  Chemotherapy    Adjuvant chemotherapy with Capecitabine 2500 mg twice daily, on day 1-14, oxaliplatin 130 mg/m, every 21 days      HISTORY OF PRESENTING ILLNESS:  Chad [name] 45 y.o.  male is here because of recently diagnosed colon cancer.  He is accompanied by his friend to the clinic today.  He presented with abdominal pain since Oct 2016,  Was seen in the emergency room on 05/11/2015,  CT abdomen and pelvis showed  Mild thickening of the urinary bladder wall, gallstones, otherwise negative. He was seen by her primary care physician Dr. Brigitte Pulse afterwards, who referred him to GI Dr. Carlean Purl.  His father passed away from [place] cancer around that time,  So his appointment was postponed, and he finally had colonoscopy in early January 2017, which showed multiple polyps, and  A ulcerated mass in distal sigmoid colon.  He was subsequently referred to colorectal surgeon Dr. Johney Maine, and  Eventually had left colectomy  On 11/01/2015.  He tolerated surgery well, and recovered well. He has good appetite good, BM is normal most of time, occasional diarrhea, likely second to food, mild low abdomen pain, which has improves, he lost about 30 lbs before surgery and 10lbs after surgery.   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  He is divorced, he has a 64 year old daughter who lives with her mother in [place]. He has sisters and brothers, and her mother who live close by.   CURRENT THERAPY: Adjuvant chemotherapy with Capecitabine 2500 mg twice daily, on day 1-14, oxaliplatin 130 mg/m, every 21 days, started on 11/30/2015  INTERIM HISTORY: Jiovanny returns  for follow-up and last cycle chemotherapy. He had moderate fatigue and some leg pain for 4-5 days after his last cycle chemotherapy, recovered well. He feels well overall, except mild fatigue. No other complaints.  MEDICAL HISTORY:  Past Medical History:  Diagnosis Date  . Cancer [facility])    colon cancer  . Coronary artery disease   . Diabetes mellitus without complication ([place]) 09/3543   oral agents only (07/2015)  . ETOH abuse 2014  . Fatty liver 12/2014   on ultrasound. pt obese and abuses  ETOH  . Gallstone 12/2014   GB stones and sludge on ultrasound and CT,  . GERD (gastroesophageal reflux disease)   . Hypertension   . Morbid obesity with BMI of 50.0-59.9, adult ([place]) 03/2013  . Rectal bleeding 05/2015   07/2015 colonoscopy with malignant appearing sigmoid mass. Additional rectal, descending, polyps  . Shoulder pain, bilateral    due to previous injury    SURGICAL HISTORY: Past Surgical History:  Procedure Laterality Date  . CARDIAC CATHETERIZATION N/A 04/06/2015   Procedure: Left Heart Cath and Coronary Angiography;  Surgeon: Belva Crome, MD;  Location: [facility];  Service: Cardiovascular;  Laterality: N/A;  . COLONOSCOPY N/A 07/25/2015   Procedure: COLONOSCOPY;  Surgeon: Gatha Mayer, MD;  Location: [facility];  Service: Gastroenterology;  Laterality: N/A;  . CYSTOSCOPY  10/18/2015   Procedure: CYSTOSCOPY FLEXIBLE URETHRAL DIALATION AND FOLEY PLACEMENT;  Surgeon: Alexis Frock, MD;  Location: [facility];  Service: Urology;;  . ESOPHAGOGASTRODUODENOSCOPY N/A 07/25/2015   Procedure: ESOPHAGOGASTRODUODENOSCOPY (EGD);  Surgeon: Gatha Mayer, MD;  Location: [facility];  Service: Gastroenterology;  Laterality: N/A;    SOCIAL HISTORY: Social History   Social History  . Marital status: Legally Separated    Spouse name: N/A  . Number of children: 1  . Years of education: N/A   Occupational History  . Manager    Social History Main Topics  . Smoking status: Never Smoker  . Smokeless tobacco: Current User     Comment: occasionally dips tobacco - has used for approx 20 yrs (12/21/15)  . Alcohol use Yes     Comment: heavy drinking for 10 years, 2-3  drinks of liqour a night, has been cutting back.    . Drug use: No  . Sexual activity: Not on file   Other Topics Concern  . Not on file   Social History Narrative   Divorced, has 3 yo daughter lives in [place]   Lives alone   Manages chain of car wash dealers       FAMILY HISTORY: Family History  Problem  Relation Age of Onset  . Diabetes Father   . Pancreatic cancer Father 41    former smoker, but had not smoked in 50 years  . Cancer Maternal Uncle     d. 20s; unknown type of cancer -metastatic throughout abdomen   . Other Brother     oldest brother has had a normal colonoscopy  . Heart attack Maternal Grandfather     d. 57-58; heavy smoker and drinker  . Heart attack Paternal Grandmother     d. early 82s  . Heart attack Paternal Grandfather 2  . Heart failure Maternal Uncle   . Skin cancer Maternal Uncle 42    NOS type; was out in the sun a lot  . Brain cancer Maternal Uncle     NOS type; mother's maternal half-brother dx. 64-68  . Cirrhosis Paternal Uncle   . Cancer Paternal Uncle     (  x2-3) uncles with NOS cancers; d. 70s    ALLERGIES:  has No Known Allergies.  MEDICATIONS:  Current Outpatient Prescriptions  Medication Sig Dispense Refill  . Ascorbic Acid (VITAMIN C) 1000 MG tablet Take 1,000 mg by mouth daily.    . aspirin EC 81 MG tablet Take 1 tablet (81 mg total) by mouth daily. DO NOT TAKE AGAIN UNTIL 08/09/15    . carvedilol (COREG) 6.25 MG tablet Take 1 tablet (6.25 mg total) by mouth 2 (two) times daily with a meal. (Patient taking differently: Take 6.25 mg by mouth every morning. ) 180 tablet 1  . glipiZIDE (GLUCOTROL XL) 5 MG 24 hr tablet TAKE 1 TABLET(5 MG) BY MOUTH DAILY WITH BREAKFAST 90 tablet 0  . lisinopril-hydrochlorothiazide (PRINZIDE,ZESTORETIC) 20-25 MG tablet TAKE 1 TABLET BY MOUTH DAILY 90 tablet 0  . metFORMIN (GLUCOPHAGE-XR) 500 MG 24 hr tablet Take 4 tablets once a day WITH BREAKFAST 360 tablet 3  . omeprazole (PRILOSEC) 40 MG capsule Take 1 capsule (40 mg total) by mouth daily. 90 capsule 3  . capecitabine (XELODA) 500 MG tablet Take 5 tablets (2,500 mg total) by mouth 2 (two) times daily after a meal. 14 days on & 7 days off (Patient not taking: Reported on 02/08/2016) 140 tablet 2  . ondansetron (ZOFRAN) 8 MG tablet Take 1 tablet (8 mg total) by mouth  2 (two) times daily as needed for refractory nausea / vomiting. Start on day 3 after chemotherapy. (Patient not taking: Reported on 02/08/2016) 30 tablet 1  . prochlorperazine (COMPAZINE) 10 MG tablet Take 1 tablet (10 mg total) by mouth every 6 (six) hours as needed (Nausea or vomiting). (Patient not taking: Reported on 02/08/2016) 30 tablet 1   No current facility-administered medications for this visit.     REVIEW OF SYSTEMS:   Constitutional: Denies fevers, chills or abnormal night sweats Eyes: Denies blurriness of vision, double vision or watery eyes Ears, nose, mouth, throat, and face: Denies mucositis or sore throat Respiratory: Denies cough, dyspnea or wheezes Cardiovascular: Denies palpitation, chest discomfort or lower extremity swelling Gastrointestinal:  Denies nausea, heartburn or change in bowel habits Skin: Denies abnormal skin rashes Lymphatics: Denies new lymphadenopathy or easy bruising Neurological:Denies numbness, tingling or new weaknesses Behavioral/Psych: Mood is stable, no new changes  All other systems were reviewed with the patient and are negative.  PHYSICAL EXAMINATION: ECOG PERFORMANCE STATUS: 1 - Symptomatic but completely ambulatory  Vitals:   02/08/16 1013  BP: (!) 163/88  Pulse: 86  Resp: (!) 21  Temp: 99 F (37.2 C)   Filed Weights   02/08/16 1013  Weight: (!) 402 lb (182.3 kg)    GENERAL:alert, no distress and comfortable,  Morbidly obese. SKIN: skin color, texture, turgor are normal, no rashes or significant lesions EYES: normal, conjunctiva are pink and non-injected, sclera clear OROPHARYNX:no exudate, no erythema and lips, buccal mucosa, and tongue normal  NECK: supple, thyroid normal size, non-tender, without nodularity LYMPH:  no palpable lymphadenopathy in the cervical, axillary or inguinal LUNGS: clear to auscultation and percussion with normal breathing effort HEART: regular rate & rhythm and no murmurs and no lower extremity  edema ABDOMEN:abdomen soft,  Surgical incision and laparoscopic scars are well-healed. non-tender and normal bowel sounds Musculoskeletal:no cyanosis of digits and no clubbing  PSYCH: alert & oriented x 3 with fluent speech NEURO: no focal motor/sensory deficits  LABORATORY DATA:  I have reviewed the data as listed CBC Latest Ref Rng & Units 02/08/2016 01/11/2016 12/21/2015  WBC 4.0 -   10.3 10e3/uL 8.5 8.4 8.6  Hemoglobin 13.0 - 17.1 g/dL 14.9 14.8 14.5  Hematocrit 38.4 - 49.9 % 42.9 44.1 42.8  Platelets 140 - 400 10e3/uL 185 213 268    CMP Latest Ref Rng & Units 02/08/2016 01/11/2016 12/21/2015  Glucose 70 - 140 mg/dl 271(H) 242(H) 215(H)  BUN 7.0 - 26.0 mg/dL 11.9 9.8 9.9  Creatinine 0.7 - 1.3 mg/dL 0.8 0.8 0.8  Sodium 136 - 145 mEq/L 137 139 140  Potassium 3.5 - 5.1 mEq/L 4.4 3.6 4.3  Chloride 101 - 111 mmol/L - - -  CO2 22 - 29 mEq/L 22 21(L) 24  Calcium 8.4 - 10.4 mg/dL 9.6 9.1 9.4  Total Protein 6.4 - 8.3 g/dL 7.6 7.1 7.4  Total Bilirubin 0.20 - 1.20 mg/dL 0.72 0.58 0.60  Alkaline Phos 40 - 150 U/L 62 57 48  AST 5 - 34 U/L 25 27 25  ALT 0 - 55 U/L 31 32 32    PATHOLOGY REPORT: Diagnosis 10/18/2015 1. Colon, segmental resection for tumor, rectosigmoid INFILTRATIVE MODERATELY DIFFERENTIATED ADENOCARCINOMA OF THE COLON (3.8 CM) THE TUMOR INVADES MUSCULARIS PROPRIA MARGINS OF RESECTION ARE NEGATIVE FOR TUMOR METASTATIC COLONIC ADENOCARCINOMA IN ONE OF TWENTY-NINE LYMPH NODES (1/29) 2. Colon, resection margin (donut), final distal margin BENIGN COLONIC TISSUE, NEGATIVE FOR CARCINOMA Microscopic Comment 1. COLON AND RECTUM (INCLUDING TRANS-ANAL RESECTION): Specimen: Rectosigmoid colon Procedure: Segmental resection Tumor site: Rectal colon Specimen integrity: Intact Macroscopic intactness of mesorectum: Not applicable: NA Complete: NA Near complete: x Incomplete: NA Cannot be determined (specify): NA Macroscopic tumor perforation: Muscularis propria Invasive tumor: Maximum  size: 3.8 cm Histologic type(s): Adenocarcinoma Histologic grade and differentiation: G2 G1: well differentiated/low grade G2: moderately differentiated/low grade G3: poorly differentiated/high grade G4: undifferentiated/high grade Type of polyp in which invasive carcinoma arose: Tubular adenoma Microscopic extension of invasive tumor:Muscularis propria Lymph-Vascular invasion: Negative Peri-neural invasion: Negative Tumor deposit(s) (discontinuous extramural extension): Negative Resection margins: Proximal margin: Negative Distal margin: Negative Circumferential (radial) (posterior ascending, posterior descending; lateral and posterior mid-rectum; and entire lower 1/3 rectum):Negative Mesenteric margin (sigmoid and transverse): Negative Distance closest margin (if all above margins negative): 4.8 cm Trans-anal resection margins only: Deep margin: NA Mucosal Margin: NA Distance closest mucosal margin (if negative): NA Treatment effect (neo-adjuvant therapy): NA Additional polyp(s): Hyperplastic poly Non-neoplastic findings: Unremarkable Lymph nodes: number examined 29; number positive: 1 Pathologic Staging: T2, N1, M0   Mismatch Repair (MMR) Protein Immunohistochemistry (IHC) IHC Expression Result: MLH1: Preserved nuclear expression (greater 50% tumor expression) MSH2: Preserved nuclear expression (greater 50% tumor expression) MSH6: Preserved nuclear expression (greater 50% tumor expression) PMS2: Preserved nuclear expression (greater 50% tumor expression) * Internal control demonstrates intact nuclear expression Interpretation: NORMAL     Diagnosis 07/25/2015 1. Colon, polyp(s), descending - TUBULAR ADENOMA. NO HIGH GRADE DYSPLASIA OR MALIGNANCY IDENTIFIED. 2. Colon, biopsy, distal sigmoid mass - INVASIVE COLORECTAL ADENOCARCINOMA. 3. Colon, polyp(s), small recto sigmoid - HYPERPLASTIC POLYP. NO ADENOMATOUS CHANGE OR MALIGNANCY. 4. Rectum, polyp(s) - TUBULOVILLOUS  ADENOMA. NO HIGH GRADE DYSPLASIA OR MALIGNANCY IDENTIFIED.   RADIOGRAPHIC STUDIES: I have personally reviewed the radiological images as listed and agreed with the findings in the report.  CT chest, abdomen and pelvis w contrast 11/22/2015 IMPRESSION: 1. No definite signs of metastatic disease within the chest, abdomen or pelvis. 2. Hepatic evaluation is limited by the limited contrast bolus and heterogeneous hepatic steatosis. MRI of the liver could be performed for more definitive evaluation if clinically warranted. 3. Presumed postoperative retroperitoneal fluid collections adjacent to sigmoid colon   anastomosis. The contrast has not yet passed through the anastomosis, although there is no air in the collections to suggest an ongoing leak.    COLONOSCOPY 07/25/2015 ENDOSCOPIC IMPRESSION: 1) 1/4 circumference firm ulcerated mass in distal sigmmoid - approx 22 cm from verge. Looks like cancer. Biopsied. 2) 8 mm descending polyp and 5 mm rectosigmoid polyp removed cold snare, completely recovered and sent to path. 3) 18-20 mm pedunculated proximal rectal polyp removed hot snare and completely recovered for pathology. 4) Otherwise normal colonoscopy RECOMMENDATIONS: 1. Hold Aspirin and all other NSAIDS for 2 weeks. 2. Will call pathology results and plans though anticipate surgical referral after pathology in. CEA will be drawn at hospital today. He has had recent CT's of chest, and abd/pelvis - will see if he needs new ones - he may.  EGD 07/25/2015 ENDOSCOPIC IMPRESSION: Normal appearing esophagus and GE junction, the stomach was well visualized and normal in appearance, normal appearing duodenum  ASSESSMENT & PLAN:  45 year old Caucasian male, with past medical history of heavy alcohol drinking, hypertension, diabetes, coronary artery disease, presented with abdominal pain.  1.  Cancer of left colon, Distal sigmoid, invasive adenocarcinoma, G2, pT2N1aM0, stage IIIA,  MSI-stable -I reviewed his CT scan findings, and the surgical pathology findings include details with patient. -he has locally advanced sigmoid colon cancer, with one out of 29 lymph nodes positive. -We discussed the risk of cancer recurrence after's complete surgical resection. Giving his stage IIIa disease, he is at moderate to high risk for recurrence. -His initial staging CT scan was done 6 months ago, I recommend him to have a repeat a CT chest, abdomen and pelvis with contrast for restaging, which was negative  -We discussed the standard care for stage III colon cancer is adjuvant chemotherapy. Given his young age, I recommend combined chemotherapy, FOLFOX or CAPEOX. Due to his work schedule, he opted CAPEOX. -We discussed the new data from pooled 6 phase 3 trial investigating duration of adjuvant oxaliplatin-based therapy (3 versus 6 months) for stage III colon cancer which was reported in ASCO 2017 recently, showed similar three-year disease-free survival of 3 months versus 6 months in patients with T1-3N1 disease. Based on the new data, I recommend him to have 3 months (instead of 6 months) CAPEOX, he is very happy to hear the news.  -He is tolerating chemotherapy moderately well, has moderate fatigue and cold sensitivity. Lab results reviewed, adequate for treatment, we'll proceed to cycle 4 today -He will complete the last cycle chemotherapy in 2 weeks. We discussed cancer surveillance, I recommend him to follow-up with [place] every 3-4 months with for the first 2-3 years, then every 6-12 months for total of 5 years. I recommend restaging CT scan every 6-12 months for up to 5 years -I encouraged him to eat a healthy and exercise, and try to lose some weight.  2. Genetics -per NCCN guideline, due to his young age and family history of pancreatic cancer, he is qualified for genetic testing to ruled out inheritable genetic syndromes. -He was seen by our genetic counselor, the genetic testing results  are still pending  3. HTN, DM, CAD, morbid obesity -he will continue follow-up with his primary care physician -We will monitor his blood pressure, glucose closely during the chemotherapy, and may need to adjust his the medication if needed. -I strongly encouraged him to see Dr. Brigitte Pulse to adjust his diabetic medication due to his uncontrolled hyperglycemia and hypertension    4. History of alcohol abuse -He has stopped  alcohol completely since he started chemotherapy.   5. Fatigue and neuropathy -Second to chemotherapy. Neuropathy resolved now -Nausea management was reviewed with him again. -We'll monitor his neuropathy closely.  Plan -Lab results reviewed, we'll proceed cycle 4 chemotherapy today (last cycle)  -I'll see him back in 3 months with CT scan and lab 1 week before. -I strongly encouraged him to see his primary care physician for his uncontrolled hypertension and hyperglycemia  All questions were answered. The patient knows to call the clinic with any problems, questions or concerns. I spent 20 minutes counseling the patient face to face. The total time spent in the appointment was 25 minutes and more than 50% was on counseling.     Feng, Yan, MD 02/08/2016 10:35 AM  

## 2016-02-18 ENCOUNTER — Other Ambulatory Visit: Payer: Self-pay | Admitting: Physician Assistant

## 2016-02-18 ENCOUNTER — Other Ambulatory Visit: Payer: Self-pay | Admitting: Family Medicine

## 2016-02-22 ENCOUNTER — Other Ambulatory Visit: Payer: Self-pay

## 2016-02-22 NOTE — Telephone Encounter (Signed)
Patient made an appointment with Dr. Brigitte Pulse on 04/04/16 and wants to know if he can get a refill for glipizide so he can have enough until he's seen. [phone]  [facility] on [address]

## 2016-02-23 MED ORDER — GLIPIZIDE ER 5 MG PO TB24: 5.0000 mg | ORAL_TABLET | Freq: Every day | ORAL | 0 refills | Status: DC

## 2016-02-23 NOTE — Telephone Encounter (Signed)
Pt has not been seen since 06/2015.

## 2016-02-23 NOTE — Telephone Encounter (Signed)
Will be happy to refill.  He will need to keep his appointment with Dr. Brigitte Pulse. Philis Fendt, MS, PA-C 1:10 PM, 02/23/2016

## 2016-03-05 ENCOUNTER — Encounter: Payer: Self-pay | Admitting: Genetic Counselor

## 2016-03-05 DIAGNOSIS — Z1379 Encounter for other screening for genetic and chromosomal anomalies: Secondary | ICD-10-CM | POA: Insufficient documentation

## 2016-04-03 ENCOUNTER — Ambulatory Visit (INDEPENDENT_AMBULATORY_CARE_PROVIDER_SITE_OTHER): Payer: BLUE CROSS/BLUE SHIELD | Admitting: Family Medicine

## 2016-04-03 ENCOUNTER — Encounter: Payer: Self-pay | Admitting: Family Medicine

## 2016-04-03 VITALS — BP 122/80 | HR 101 | Temp 98.1°F | Resp 16 | Ht 75.0 in | Wt >= 6400 oz

## 2016-04-03 DIAGNOSIS — Z7289 Other problems related to lifestyle: Secondary | ICD-10-CM

## 2016-04-03 DIAGNOSIS — K760 Fatty (change of) liver, not elsewhere classified: Secondary | ICD-10-CM

## 2016-04-03 DIAGNOSIS — Z789 Other specified health status: Secondary | ICD-10-CM

## 2016-04-03 DIAGNOSIS — G4700 Insomnia, unspecified: Secondary | ICD-10-CM

## 2016-04-03 DIAGNOSIS — I10 Essential (primary) hypertension: Secondary | ICD-10-CM | POA: Diagnosis not present

## 2016-04-03 DIAGNOSIS — E1140 Type 2 diabetes mellitus with diabetic neuropathy, unspecified: Secondary | ICD-10-CM

## 2016-04-03 DIAGNOSIS — I5022 Chronic systolic (congestive) heart failure: Secondary | ICD-10-CM

## 2016-04-03 DIAGNOSIS — E119 Type 2 diabetes mellitus without complications: Secondary | ICD-10-CM

## 2016-04-03 LAB — COMPREHENSIVE METABOLIC PANEL
ALBUMIN: 3.8 g/dL (ref 3.6–5.1)
ALK PHOS: 55 U/L (ref 40–115)
ALT: 23 U/L (ref 9–46)
AST: 19 U/L (ref 10–40)
BILIRUBIN TOTAL: 1 mg/dL (ref 0.2–1.2)
BUN: 9 mg/dL (ref 7–25)
CO2: 24 mmol/L (ref 20–31)
CREATININE: 0.68 mg/dL (ref 0.60–1.35)
Calcium: 8.9 mg/dL (ref 8.6–10.3)
Chloride: 99 mmol/L (ref 98–110)
Glucose, Bld: 235 mg/dL — ABNORMAL HIGH (ref 65–99)
Potassium: 3.6 mmol/L (ref 3.5–5.3)
SODIUM: 137 mmol/L (ref 135–146)
Total Protein: 7.1 g/dL (ref 6.1–8.1)

## 2016-04-03 LAB — TSH: TSH: 2.24 mIU/L (ref 0.40–4.50)

## 2016-04-03 LAB — POCT GLYCOSYLATED HEMOGLOBIN (HGB A1C): Hemoglobin A1C: 8.3

## 2016-04-03 MED ORDER — GABAPENTIN 300 MG PO CAPS: 300.0000 mg | ORAL_CAPSULE | Freq: Every day | ORAL | 1 refills | Status: DC

## 2016-04-03 MED ORDER — LORAZEPAM 1 MG PO TABS: 1.0000 mg | ORAL_TABLET | Freq: Every evening | ORAL | 1 refills | Status: DC | PRN

## 2016-04-03 NOTE — Progress Notes (Signed)
Subjective:    Patient ID: Chad [name], male    DOB: [birth year], 45 y.o.   MRN: [id number] Chief Complaint  Patient presents with  . 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 Chad [name] is undergoint oncology chemo infusions by Dr. Burr Medico Occ diarrhea.  Take metformin 2g.  Running 160, occ 120s, occ 180 - usually checks about 2 hours after eating breakfast.  Is always high when Chad [name] gets up.  Stomach pains radomly.  Back to work for [employer] time.  cmp done 2 mos prior with cbc. cbg was ele at 271. hgba1c 10/18/15 7.2 lipisa qwew sonw 07/13/15 microalb 11/2014  Feet hurt a lot when Chad [name] was ative.   Wakes up after 2 hours - one night a week will get up to 5 hrs.  Chad [name] is exhusted. Chad [name] is up all night and then Chad [name] will finally fall asleep 6 pm and then wakes up at 7 Tried to change diet - doing fruit and yogurt Did have a sleep study prior Tries to assicate it with things - stress I his life seems to be the only thing Chad [name] does not get overwhelmed prior. But now Chad [name] is. Right after the chemo stop Affecting his work - not as sharp  Chemo hollowed at his teeth.  Has a CT scan next moo, another, and a colnosocpy in 3 months.  Still gets occ left stabbing CP in back and axilla. Does get more [facility], more DOE - takes him longer.  If Chad [name] can sleep 6-7 hrs.  Past Medical History:  Diagnosis Date  . Cancer [facility])    colon cancer  . Coronary artery disease   . Diabetes mellitus without complication ([place]) 0000000   oral agents only (07/2015)  . ETOH abuse 2014  . Fatty liver 12/2014   on ultrasound. pt obese and abuses ETOH  . Gallstone 12/2014   GB stones and sludge on ultrasound and CT,  . GERD (gastroesophageal reflux disease)   . Hypertension   . Morbid obesity with BMI of 50.0-59.9, adult ([place]) 03/2013  . Rectal bleeding 05/2015   07/2015 colonoscopy with malignant appearing sigmoid mass. Additional rectal,  descending, polyps  . Shoulder pain, bilateral    due to previous injury   Past Surgical History:  Procedure Laterality Date  . CARDIAC CATHETERIZATION N/A 04/06/2015   Procedure: Left Heart Cath and Coronary Angiography;  Surgeon: Belva Crome, MD;  Location: [place] CV LAB;  Service: Cardiovascular;  Laterality: N/A;  . COLONOSCOPY N/A 07/25/2015   Procedure: COLONOSCOPY;  Surgeon: Gatha Mayer, MD;  Location: [facility] ENDOSCOPY;  Service: Gastroenterology;  Laterality: N/A;  . CYSTOSCOPY  10/18/2015   Procedure: CYSTOSCOPY FLEXIBLE URETHRAL DIALATION AND FOLEY PLACEMENT;  Surgeon: Alexis Frock, MD;  Location: [facility] ORS;  Service: Urology;;  . ESOPHAGOGASTRODUODENOSCOPY N/A 07/25/2015   Procedure: ESOPHAGOGASTRODUODENOSCOPY (EGD);  Surgeon: Gatha Mayer, MD;  Location: [facility];  Service: Gastroenterology;  Laterality: N/A;   Current Outpatient Prescriptions on File Prior to Visit  Medication Sig Dispense Refill  . Ascorbic Acid (VITAMIN C) 1000 MG tablet Take 1,000 mg by mouth daily.    Marland Kitchen aspirin EC 81 MG tablet Take 1 tablet (81 mg total) by mouth daily. DO NOT TAKE AGAIN UNTIL 08/09/15    . capecitabine (XELODA) 500 MG tablet Take 5 tablets (2,500 mg total) by mouth 2 (two) times daily after a  meal. 14 days on & 7 days off 140 tablet 2  . carvedilol (COREG) 6.25 MG tablet Take 1 tablet (6.25 mg total) by mouth 2 (two) times daily with a meal. (Patient taking differently: Take 6.25 mg by mouth every morning. ) 180 tablet 1  . glipiZIDE (GLUCOTROL XL) 5 MG 24 hr tablet Take 1 tablet (5 mg total) by mouth daily with breakfast. 60 tablet 0  . lisinopril-hydrochlorothiazide (PRINZIDE,ZESTORETIC) 20-25 MG tablet TAKE 1 TABLET BY MOUTH DAILY 90 tablet 0  . metFORMIN (GLUCOPHAGE-XR) 500 MG 24 hr tablet Take 4 tablets once a day WITH BREAKFAST 360 tablet 3  . metFORMIN (GLUCOPHAGE-XR) 500 MG 24 hr tablet TAKE 4 TABLETS BY MOUTH EVERY MORNING WITH BREAKFAST 360 tablet 0  . omeprazole (PRILOSEC)  40 MG capsule Take 1 capsule (40 mg total) by mouth daily. 90 capsule 3  . ondansetron (ZOFRAN) 8 MG tablet Take 1 tablet (8 mg total) by mouth 2 (two) times daily as needed for refractory nausea / vomiting. Start on day 3 after chemotherapy. (Patient not taking: Reported on 04/03/2016) 30 tablet 1  . prochlorperazine (COMPAZINE) 10 MG tablet Take 1 tablet (10 mg total) by mouth every 6 (six) hours as needed (Nausea or vomiting). (Patient not taking: Reported on 04/03/2016) 30 tablet 1   No current facility-administered medications on file prior to visit.    No Known Allergies Family History  Problem Relation Age of Onset  . Diabetes Father   . Pancreatic cancer Father 62    former smoker, but had not smoked in 19 years  . Cancer Maternal Uncle     d. 83s; unknown type of cancer -metastatic throughout abdomen   . Other Brother     oldest brother has had a normal colonoscopy  . Heart attack Maternal Grandfather     d. 57-58; heavy smoker and drinker  . Heart attack Paternal Grandmother     d. early 36s  . Heart attack Paternal Grandfather 16  . Heart failure Maternal Uncle   . Skin cancer Maternal Uncle 42    NOS type; was out in the sun a lot  . Brain cancer Maternal Uncle     NOS type; mother's maternal half-brother dx. 21-68  . Cirrhosis Paternal Uncle   . Cancer Paternal Uncle     (x2-3) uncles with NOS cancers; d. 22s   Social History   Social History  . Marital status: Legally Separated    Spouse name: N/A  . Number of children: 1  . Years of education: N/A   Occupational History  . Manager    Social History Main Topics  . Smoking status: Never Smoker  . Smokeless tobacco: Current User     Comment: occasionally dips tobacco - has used for approx 20 yrs (12/21/15)  . Alcohol use Yes     Comment: heavy drinking for 10 years, 2-3  drinks of liqour a night, has been cutting back.    . Drug use: No  . Sexual activity: Not Asked   Other Topics Concern  . 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 Chad [name] appears well-developed and well-nourished. No distress.  HENT:  Head: Normocephalic and atraumatic.  Eyes: Conjunctivae  are normal. Pupils are equal, round, and reactive to light. No scleral icterus.  Neck: Normal range of motion. Neck supple. No thyromegaly present.  Cardiovascular: Normal rate, regular rhythm, normal heart sounds and intact distal pulses.   Pulmonary/Chest: Effort normal and breath sounds normal. No respiratory distress.  Musculoskeletal: Chad [name] exhibits no edema.  Lymphadenopathy:    Chad [name] has no cervical adenopathy.  Neurological: Chad [name] is alert and oriented to person, place, and time.  Skin: Skin is warm and dry. Chad [name] is not diaphoretic.  Psychiatric: Chad [name] has a normal mood and affect. His behavior is normal.   BP 122/80 (BP Location: Right Arm, Patient Position: Sitting, Cuff Size: Large)   Pulse (!) 101   Temp 98.1 F (36.7 C) (Oral)   Resp 16   Ht 6\' 3"  (1.905 m)   Wt (!) 404 lb 12.8 oz (183.6 kg)   SpO2 97%   BMI 50.60 kg/m      Assessment & Plan:   1. Chronic systolic HF (heart failure) ([place])   2. Diabetes mellitus without complication ([place])   3. Essential hypertension, benign   4. Hepatic steatosis   5. Neuropathy due to type 2 diabetes mellitus ([place])   6. Alcohol use   7. Morbid obesity, unspecified obesity type ([place])   8. Insomnia     Orders Placed This Encounter  Procedures  . Comprehensive metabolic panel  . Microalbumin/Creatinine Ratio, Urine  . TSH  . POCT glycosylated hemoglobin (Hb A1C)    Meds ordered this encounter  Medications  . gabapentin (NEURONTIN) 300 MG capsule    Sig: Take 1 capsule (300 mg total) by mouth at bedtime.    Dispense:  90 capsule    Refill:  1  . LORazepam (ATIVAN) 1 MG tablet     Sig: Take 1 tablet (1 mg total) by mouth at bedtime as needed for anxiety.    Dispense:  20 tablet    Refill:  1     Delman Cheadle, M.D.  [facility] [address] [place], [place] [address] [phone] phone [phone] fax  04/15/16 9:08 AM  Results for orders placed or performed in visit on 04/03/16  Comprehensive metabolic panel  Result Value Ref Range   Sodium 137 135 - 146 mmol/L   Potassium 3.6 3.5 - 5.3 mmol/L   Chloride 99 98 - 110 mmol/L   CO2 24 20 - 31 mmol/L   Glucose, Bld 235 (H) 65 - 99 mg/dL   BUN 9 7 - 25 mg/dL   Creat 0.68 0.60 - 1.35 mg/dL   Total Bilirubin 1.0 0.2 - 1.2 mg/dL   Alkaline Phosphatase 55 40 - 115 U/L   AST 19 10 - 40 U/L   ALT 23 9 - 46 U/L   Total Protein 7.1 6.1 - 8.1 g/dL   Albumin 3.8 3.6 - 5.1 g/dL   Calcium 8.9 8.6 - 10.3 mg/dL  TSH  Result Value Ref Range   TSH 2.24 0.40 - 4.50 mIU/L  POCT glycosylated hemoglobin (Hb A1C)  Result Value Ref Range   Hemoglobin A1C 8.3

## 2016-04-03 NOTE — Patient Instructions (Signed)
     IF you received an x-ray today, you will receive an invoice from [facility]. Please contact [facility] at [phone] with questions or concerns regarding your invoice.   IF you received labwork today, you will receive an invoice from [facility]. Please contact [facility] at [phone] with questions or concerns regarding your invoice.   Our billing staff will not be able to assist you with questions regarding bills from these companies.  You will be contacted with the lab results as soon as they are available. The fastest way to get your results is to activate your My Chart account. Instructions are located on the last page of this paperwork. If you have not heard from us regarding the results in 2 weeks, please contact this office.      

## 2016-04-04 ENCOUNTER — Ambulatory Visit: Payer: No Typology Code available for payment source | Admitting: Family Medicine

## 2016-04-15 ENCOUNTER — Other Ambulatory Visit: Payer: Self-pay | Admitting: *Deleted

## 2016-04-15 DIAGNOSIS — E119 Type 2 diabetes mellitus without complications: Secondary | ICD-10-CM

## 2016-04-17 ENCOUNTER — Telehealth: Payer: Self-pay | Admitting: *Deleted

## 2016-04-17 ENCOUNTER — Other Ambulatory Visit: Payer: Self-pay | Admitting: Family Medicine

## 2016-04-17 NOTE — Telephone Encounter (Signed)
Oncology Nurse Navigator Documentation  Oncology Nurse Navigator Flowsheets 04/17/2016  Navigator Location [facility]  Navigator Encounter Type Telephone  Telephone Outgoing Call;Appt Confirmation/Clarification  Abnormal Finding Date -  Confirmed Diagnosis Date -  Surgery Date -  Treatment Initiated Date -  Patient Visit Type -  Treatment Phase -  Education -  Interventions Coordination of Care--CT scan  Coordination of Care Radiology--CT 05/01/16 at 12:30 with lab at 1130. NPO 4 hours prior except contrast at 1130 and 1030. Patient aware and has contrast at home.  Education Method -  Support Groups/Services -  Acuity -  Time Spent with Patient 15

## 2016-04-26 ENCOUNTER — Ambulatory Visit (HOSPITAL_COMMUNITY): Payer: BLUE CROSS/BLUE SHIELD

## 2016-04-26 ENCOUNTER — Other Ambulatory Visit: Payer: BLUE CROSS/BLUE SHIELD

## 2016-05-01 ENCOUNTER — Other Ambulatory Visit (HOSPITAL_BASED_OUTPATIENT_CLINIC_OR_DEPARTMENT_OTHER): Payer: BLUE CROSS/BLUE SHIELD

## 2016-05-01 ENCOUNTER — Ambulatory Visit (HOSPITAL_COMMUNITY)
Admission: RE | Admit: 2016-05-01 | Discharge: 2016-05-01 | Disposition: A | Discharge status: Home / Self Care | Payer: BLUE CROSS/BLUE SHIELD | Source: Ambulatory Visit | Attending: Hematology | Admitting: Hematology

## 2016-05-01 DIAGNOSIS — C19 Malignant neoplasm of rectosigmoid junction: Secondary | ICD-10-CM

## 2016-05-01 DIAGNOSIS — C186 Malignant neoplasm of descending colon: Secondary | ICD-10-CM

## 2016-05-01 LAB — CBC WITH DIFFERENTIAL/PLATELET
BASO%: 0.6 % (ref 0.0–2.0)
Basophils Absolute: 0 10*3/uL (ref 0.0–0.1)
EOS ABS: 0.2 10*3/uL (ref 0.0–0.5)
EOS%: 3.1 % (ref 0.0–7.0)
HCT: 44.6 % (ref 38.4–49.9)
HGB: 15 g/dL (ref 13.0–17.1)
LYMPH#: 2.7 10*3/uL (ref 0.9–3.3)
LYMPH%: 34 % (ref 14.0–49.0)
MCH: 33.1 pg (ref 27.2–33.4)
MCHC: 33.6 g/dL (ref 32.0–36.0)
MCV: 98.4 fL — AB (ref 79.3–98.0)
MONO#: 0.7 10*3/uL (ref 0.1–0.9)
MONO%: 8.5 % (ref 0.0–14.0)
NEUT%: 53.8 % (ref 39.0–75.0)
NEUTROS ABS: 4.3 10*3/uL (ref 1.5–6.5)
Platelets: 221 10*3/uL (ref 140–400)
RBC: 4.53 10*6/uL (ref 4.20–5.82)
RDW: 12.6 % (ref 11.0–14.6)
WBC: 8 10*3/uL (ref 4.0–10.3)

## 2016-05-01 LAB — COMPREHENSIVE METABOLIC PANEL
ALBUMIN: 3.6 g/dL (ref 3.5–5.0)
ALK PHOS: 57 U/L (ref 40–150)
ALT: 36 U/L (ref 0–55)
AST: 31 U/L (ref 5–34)
Anion Gap: 10 mEq/L (ref 3–11)
BILIRUBIN TOTAL: 0.8 mg/dL (ref 0.20–1.20)
BUN: 11.8 mg/dL (ref 7.0–26.0)
CO2: 27 mEq/L (ref 22–29)
Calcium: 9.3 mg/dL (ref 8.4–10.4)
Chloride: 103 mEq/L (ref 98–109)
Creatinine: 0.8 mg/dL (ref 0.7–1.3)
GLUCOSE: 210 mg/dL — AB (ref 70–140)
Potassium: 4.3 mEq/L (ref 3.5–5.1)
SODIUM: 140 meq/L (ref 136–145)
TOTAL PROTEIN: 7.5 g/dL (ref 6.4–8.3)

## 2016-05-01 LAB — CEA (IN HOUSE-CHCC): CEA (CHCC-In House): 57.89 ng/mL — ABNORMAL HIGH (ref 0.00–5.00)

## 2016-05-01 MED ORDER — IOPAMIDOL (ISOVUE-300) INJECTION 61%
100.0000 mL | Freq: Once | INTRAVENOUS | Status: AC | PRN
  Administered 2016-05-01: 100 mL via INTRAVENOUS

## 2016-05-02 ENCOUNTER — Telehealth: Payer: Self-pay | Admitting: Hematology

## 2016-05-02 ENCOUNTER — Ambulatory Visit: Payer: BLUE CROSS/BLUE SHIELD | Admitting: Hematology

## 2016-05-02 LAB — CEA: CEA: 64.7 ng/mL — ABNORMAL HIGH (ref 0.0–4.7)

## 2016-05-02 NOTE — Telephone Encounter (Signed)
10/19 Appointment rescheduled per patient request. The patient request an appointment be scheduled for the following week because he is going out of town.

## 2016-05-02 NOTE — Progress Notes (Deleted)
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   Staging form: Colon and Rectum, AJCC 7th Edition     Pathologic stage from 10/18/2015: Stage IIIA (T2, N1a, cM0) - Signed by Truitt Merle, MD on 11/15/2015       Cancer of left colon ([place])   05/11/2015 Imaging    CT ABD/PELVIS: Gallstones w/sludge;liver prominent without focal liver lesion, mild thickening of bladder wall; negative for cancer      05/18/2015 Imaging    [place] ABD: Negative      07/25/2015 Initial Diagnosis    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 8 mm descending polyp and 5 mm rectosigmoid polyp;18-29 mm proximal rectal polyp      07/25/2015 Tumor Marker    CEA=0.7      07/26/2015 Pathology Results    Sigmoid mass: invasive colorectal adenocarcinoma      10/22/2015 Pathologic Stage    T2, N1, MO    #29 nodes examined w/ 1 postive node; moderately differentiated; Negative for Lymph-vascular and Peri-neural invasion; Microscopic extension into muscularis propria      10/26/2015 Pathology Results    MSI Stable      11/30/2015 -  Chemotherapy    Adjuvant chemotherapy with Capecitabine 2500 mg twice daily, on day 1-14, oxaliplatin 130 mg/m, every 21 days       HISTORY OF PRESENTING ILLNESS:  Chad [name] 45 y.o. male is here because of recently diagnosed colon cancer.  He is accompanied by his friend to the clinic today.  He presented with abdominal pain since Oct 2016,  Was seen in the emergency room on 05/11/2015,  CT abdomen and pelvis showed  Mild thickening of the urinary bladder wall, gallstones, otherwise negative. He was seen by her primary care physician Dr. Brigitte Pulse afterwards, who referred him to GI Dr. Carlean Purl.  His father passed away from [place] cancer around that time,  So his appointment was postponed, and he finally had colonoscopy in early January 2017, which showed multiple polyps, and  A ulcerated mass in distal sigmoid colon.  He was subsequently referred to colorectal surgeon Dr. Johney Maine, and  Eventually had left colectomy  On 11/01/2015.  He tolerated surgery well, and recovered well. He has good appetite good, BM is normal most of time, occasional diarrhea, likely second to food, mild low abdomen pain, which has improves, he lost about 30 lbs before surgery and 10lbs after surgery.   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  He is divorced, he has a 63 year old daughter who lives with her mother in [place]. He has sisters and brothers, and her mother who live close by.   CURRENT THERAPY: Adjuvant chemotherapy with Capecitabine 2500 mg twice daily, on day 1-14, oxaliplatin 130 mg/m, every 21  days, started on 11/30/2015  INTERIM HISTORY: [place] returns for follow-up and last cycle chemotherapy. He had moderate fatigue and some leg pain for 4-5 days after his last cycle chemotherapy, recovered well. He feels well overall, except mild fatigue. No other complaints.  MEDICAL HISTORY:  Past Medical History:  Diagnosis Date  . Cancer [facility])    colon cancer  . Coronary artery disease   . Diabetes mellitus without complication ([place]) 09/1515   oral agents only (07/2015)  . ETOH abuse 2014  . Fatty liver 12/2014   on ultrasound. pt  obese and abuses ETOH  . Gallstone 12/2014   GB stones and sludge on ultrasound and CT,  . GERD (gastroesophageal reflux disease)   . Hypertension   . Morbid obesity with BMI of 50.0-59.9, adult ([place]) 03/2013  . Rectal bleeding 05/2015   07/2015 colonoscopy with malignant appearing sigmoid mass. Additional rectal, descending, polyps  . Shoulder pain, bilateral    due to previous injury    SURGICAL HISTORY: Past Surgical History:  Procedure Laterality Date  . CARDIAC CATHETERIZATION N/A 04/06/2015   Procedure: Left Heart Cath and Coronary Angiography;  Surgeon: Belva Crome, MD;  Location: [facility];  Service: Cardiovascular;  Laterality: N/A;  . COLONOSCOPY N/A 07/25/2015   Procedure: COLONOSCOPY;  Surgeon: Gatha Mayer, MD;  Location: [facility];  Service: Gastroenterology;  Laterality: N/A;  . CYSTOSCOPY  10/18/2015   Procedure: CYSTOSCOPY FLEXIBLE URETHRAL DIALATION AND FOLEY PLACEMENT;  Surgeon: Alexis Frock, MD;  Location: [facility];  Service: Urology;;  . ESOPHAGOGASTRODUODENOSCOPY N/A 07/25/2015   Procedure: ESOPHAGOGASTRODUODENOSCOPY (EGD);  Surgeon: Gatha Mayer, MD;  Location: [facility];  Service: Gastroenterology;  Laterality: N/A;    SOCIAL HISTORY: Social History   Social History  . Marital status: Legally Separated    Spouse name: N/A  . Number of children: 1  . Years of education: N/A   Occupational History  . Manager    Social History Main Topics  . Smoking status: Never Smoker  . Smokeless tobacco: Current User     Comment: occasionally dips tobacco - has used for approx 20 yrs (12/21/15)  . Alcohol use Yes     Comment: heavy drinking for 10 years, 2-3  drinks of liqour a night, has been cutting back.    . Drug use: No  . Sexual activity: Not on file   Other Topics Concern  . Not on file   Social History Narrative   Divorced, has 44 yo daughter lives in [place]   Lives alone   Manages chain of car wash dealers       FAMILY HISTORY: Family  History  Problem Relation Age of Onset  . Diabetes Father   . Pancreatic cancer Father 83    former smoker, but had not smoked in 73 years  . Cancer Maternal Uncle     d. 10s; unknown type of cancer -metastatic throughout abdomen   . Other Brother     oldest brother has had a normal colonoscopy  . Heart attack Maternal Grandfather     d. 57-58; heavy smoker and drinker  . Heart attack Paternal Grandmother     d. early 28s  . Heart attack Paternal Grandfather 48  . Heart failure Maternal Uncle   . Skin cancer Maternal Uncle 42    NOS type; was out in the sun a lot  . Brain cancer Maternal Uncle     NOS type; mother's maternal half-brother dx. 53-68  .  Cirrhosis Paternal Uncle   . Cancer Paternal Uncle     (x2-3) uncles with NOS cancers; d. 41s    ALLERGIES:  has No Known Allergies.  MEDICATIONS:  Current Outpatient Prescriptions  Medication Sig Dispense Refill  . Ascorbic Acid (VITAMIN C) 1000 MG tablet Take 1,000 mg by mouth daily.    Marland Kitchen aspirin EC 81 MG tablet Take 1 tablet (81 mg total) by mouth daily. DO NOT TAKE AGAIN UNTIL 08/09/15    . capecitabine (XELODA) 500 MG tablet Take 5 tablets (2,500 mg total) by mouth 2 (two) times daily after a meal. 14 days on & 7 days off 140 tablet 2  . carvedilol (COREG) 6.25 MG tablet Take 1 tablet (6.25 mg total) by mouth 2 (two) times daily with a meal. (Patient taking differently: Take 6.25 mg by mouth every morning. ) 180 tablet 1  . gabapentin (NEURONTIN) 300 MG capsule Take 1 capsule (300 mg total) by mouth at bedtime. 90 capsule 1  . glipiZIDE (GLUCOTROL XL) 5 MG 24 hr tablet Take 1 tablet (5 mg total) by mouth daily with breakfast. 60 tablet 0  . lisinopril-hydrochlorothiazide (PRINZIDE,ZESTORETIC) 20-25 MG tablet TAKE 1 TABLET BY MOUTH DAILY 90 tablet 0  . LORazepam (ATIVAN) 1 MG tablet Take 1 tablet (1 mg total) by mouth at bedtime as needed for anxiety. 20 tablet 1  . metFORMIN (GLUCOPHAGE-XR) 500 MG 24 hr tablet Take 4 tablets once  a day WITH BREAKFAST 360 tablet 3  . metFORMIN (GLUCOPHAGE-XR) 500 MG 24 hr tablet TAKE 4 TABLETS BY MOUTH EVERY MORNING WITH BREAKFAST 360 tablet 0  . omeprazole (PRILOSEC) 40 MG capsule TAKE 1 CAPSULE(40 MG) BY MOUTH DAILY 90 capsule 0  . ondansetron (ZOFRAN) 8 MG tablet Take 1 tablet (8 mg total) by mouth 2 (two) times daily as needed for refractory nausea / vomiting. Start on day 3 after chemotherapy. (Patient not taking: Reported on 04/03/2016) 30 tablet 1  . prochlorperazine (COMPAZINE) 10 MG tablet Take 1 tablet (10 mg total) by mouth every 6 (six) hours as needed (Nausea or vomiting). (Patient not taking: Reported on 04/03/2016) 30 tablet 1   No current facility-administered medications for this visit.     REVIEW OF SYSTEMS:   Constitutional: Denies fevers, chills or abnormal night sweats Eyes: Denies blurriness of vision, double vision or watery eyes Ears, nose, mouth, throat, and face: Denies mucositis or sore throat Respiratory: Denies cough, dyspnea or wheezes Cardiovascular: Denies palpitation, chest discomfort or lower extremity swelling Gastrointestinal:  Denies nausea, heartburn or change in bowel habits Skin: Denies abnormal skin rashes Lymphatics: Denies new lymphadenopathy or easy bruising Neurological:Denies numbness, tingling or new weaknesses Behavioral/Psych: Mood is stable, no new changes  All other systems were reviewed with the patient and are negative.  PHYSICAL EXAMINATION: ECOG PERFORMANCE STATUS: 1 - Symptomatic but completely ambulatory  There were no vitals filed for this visit. There were no vitals filed for this visit.  GENERAL:alert, no distress and comfortable,  Morbidly obese. SKIN: skin color, texture, turgor are normal, no rashes or significant lesions EYES: normal, conjunctiva are pink and non-injected, sclera clear OROPHARYNX:no exudate, no erythema and lips, buccal mucosa, and tongue normal  NECK: supple, thyroid normal size, non-tender, without  nodularity LYMPH:  no palpable lymphadenopathy in the cervical, axillary or inguinal LUNGS: clear to auscultation and percussion with normal breathing effort HEART: regular rate & rhythm and no murmurs and no lower extremity edema ABDOMEN:abdomen soft,  Surgical incision and laparoscopic scars are well-healed. non-tender  and normal bowel sounds Musculoskeletal:no cyanosis of digits and no clubbing  PSYCH: alert & oriented x 3 with fluent speech NEURO: no focal motor/sensory deficits  LABORATORY DATA:  I have reviewed the data as listed CBC Latest Ref Rng & Units 05/01/2016 02/08/2016 01/11/2016  WBC 4.0 - 10.3 10e3/uL 8.0 8.5 8.4  Hemoglobin 13.0 - 17.1 g/dL 15.0 14.9 14.8  Hematocrit 38.4 - 49.9 % 44.6 42.9 44.1  Platelets 140 - 400 10e3/uL 221 185 213    CMP Latest Ref Rng & Units 05/01/2016 04/03/2016 02/08/2016  Glucose 70 - 140 mg/dl 210(H) 235(H) 271(H)  BUN 7.0 - 26.0 mg/dL 11.8 9 11.9  Creatinine 0.7 - 1.3 mg/dL 0.8 0.68 0.8  Sodium 136 - 145 mEq/L 140 137 137  Potassium 3.5 - 5.1 mEq/L 4.3 3.6 4.4  Chloride 98 - 110 mmol/L - 99 -  CO2 22 - 29 mEq/L '27 24 22  ' Calcium 8.4 - 10.4 mg/dL 9.3 8.9 9.6  Total Protein 6.4 - 8.3 g/dL 7.5 7.1 7.6  Total Bilirubin 0.20 - 1.20 mg/dL 0.80 1.0 0.72  Alkaline Phos 40 - 150 U/L 57 55 62  AST 5 - 34 U/L '31 19 25  ' ALT 0 - 55 U/L 36 23 31   CEA:  07/25/2015: 0.7 11/30/2015: 4.5 05/01/2016: 57.89 (64.7)    PATHOLOGY REPORT: Diagnosis 10/18/2015 1. Colon, segmental resection for tumor, rectosigmoid INFILTRATIVE MODERATELY DIFFERENTIATED ADENOCARCINOMA OF THE COLON (3.8 CM) THE TUMOR INVADES MUSCULARIS PROPRIA MARGINS OF RESECTION ARE NEGATIVE FOR TUMOR METASTATIC COLONIC ADENOCARCINOMA IN ONE OF TWENTY-NINE LYMPH NODES (1/29) 2. Colon, resection margin (donut), final distal margin BENIGN COLONIC TISSUE, NEGATIVE FOR CARCINOMA Microscopic Comment 1. COLON AND RECTUM (INCLUDING TRANS-ANAL RESECTION): Specimen: Rectosigmoid  colon Procedure: Segmental resection Tumor site: Rectal colon Specimen integrity: Intact Macroscopic intactness of mesorectum: Not applicable: NA Complete: NA Near complete: x Incomplete: NA Cannot be determined (specify): NA Macroscopic tumor perforation: Muscularis propria Invasive tumor: Maximum size: 3.8 cm Histologic type(s): Adenocarcinoma Histologic grade and differentiation: G2 G1: well differentiated/low grade G2: moderately differentiated/low grade G3: poorly differentiated/high grade G4: undifferentiated/high grade Type of polyp in which invasive carcinoma arose: Tubular adenoma Microscopic extension of invasive tumor:Muscularis propria Lymph-Vascular invasion: Negative Peri-neural invasion: Negative Tumor deposit(s) (discontinuous extramural extension): Negative Resection margins: Proximal margin: Negative Distal margin: Negative Circumferential (radial) (posterior ascending, posterior descending; lateral and posterior mid-rectum; and entire lower 1/3 rectum):Negative Mesenteric margin (sigmoid and transverse): Negative Distance closest margin (if all above margins negative): 4.8 cm Trans-anal resection margins only: Deep margin: NA Mucosal Margin: NA Distance closest mucosal margin (if negative): NA Treatment effect (neo-adjuvant therapy): NA Additional polyp(s): Hyperplastic poly Non-neoplastic findings: Unremarkable Lymph nodes: number examined 29; number positive: 1 Pathologic Staging: T2, N1, M0   Mismatch Repair (MMR) Protein Immunohistochemistry (IHC) IHC Expression Result: MLH1: Preserved nuclear expression (greater 50% tumor expression) MSH2: Preserved nuclear expression (greater 50% tumor expression) MSH6: Preserved nuclear expression (greater 50% tumor expression) PMS2: Preserved nuclear expression (greater 50% tumor expression) * Internal control demonstrates intact nuclear expression Interpretation: NORMAL     Diagnosis 07/25/2015 1.  Colon, polyp(s), descending - TUBULAR ADENOMA. NO HIGH GRADE DYSPLASIA OR MALIGNANCY IDENTIFIED. 2. Colon, biopsy, distal sigmoid mass - INVASIVE COLORECTAL ADENOCARCINOMA. 3. Colon, polyp(s), small recto sigmoid - HYPERPLASTIC POLYP. NO ADENOMATOUS CHANGE OR MALIGNANCY. 4. Rectum, polyp(s) - TUBULOVILLOUS ADENOMA. NO HIGH GRADE DYSPLASIA OR MALIGNANCY IDENTIFIED.   RADIOGRAPHIC STUDIES: I have personally reviewed the radiological images as listed and agreed with the findings in the report.  CT chest, abdomen and pelvis w contrast 05/01/2016  IMPRESSION: 1. Stable chest CT.  No evidence of thoracic metastatic disease. 2. Progressive heterogeneity of the liver could be due to heterogeneous steatosis. However, there are new more focal low-density lesions which are worrisome for possible metastatic disease given the patient's history. Further evaluation with hepatic MRI recommended. 3. Resolving postsurgical retroperitoneal fluid collections status post partial colon resection and anastomosis.   COLONOSCOPY 07/25/2015 ENDOSCOPIC IMPRESSION: 1) 1/4 circumference firm ulcerated mass in distal sigmmoid - approx 22 cm from verge. Looks like cancer. Biopsied. 2) 8 mm descending polyp and 5 mm rectosigmoid polyp removed cold snare, completely recovered and sent to path. 3) 18-20 mm pedunculated proximal rectal polyp removed hot snare and completely recovered for pathology. 4) Otherwise normal colonoscopy RECOMMENDATIONS: 1. Hold Aspirin and all other NSAIDS for 2 weeks. 2. Will call pathology results and plans though anticipate surgical referral after pathology in. CEA will be drawn at hospital today. He has had recent CT's of chest, and abd/pelvis - will see if he needs new ones - he may.  EGD 07/25/2015 ENDOSCOPIC IMPRESSION: Normal appearing esophagus and GE junction, the stomach was well visualized and normal in appearance, normal appearing duodenum  ASSESSMENT & PLAN:   45 year old Caucasian male, with past medical history of heavy alcohol drinking, hypertension, diabetes, coronary artery disease, presented with abdominal pain.  1.  Cancer of left colon, Distal sigmoid, invasive adenocarcinoma, G2, pT2N1aM0, stage IIIA, MSI-stable -I reviewed his CT scan findings, and the surgical pathology findings include details with patient. -he has locally advanced sigmoid colon cancer, with one out of 29 lymph nodes positive. -We discussed the risk of cancer recurrence after's complete surgical resection. Giving his stage IIIa disease, he is at moderate to high risk for recurrence. -His initial staging CT scan was done 6 months ago, I recommend him to have a repeat a CT chest, abdomen and pelvis with contrast for restaging, which was negative  -We discussed the standard care for stage III colon cancer is adjuvant chemotherapy. Given his young age, I recommend combined chemotherapy, FOLFOX or CAPEOX. Due to his work schedule, he opted CAPEOX. -We discussed the new data from pooled 6 phase 3 trial investigating duration of adjuvant oxaliplatin-based therapy (3 versus 6 months) for stage III colon cancer which was reported in ASCO 2017 recently, showed similar three-year disease-free survival of 3 months versus 6 months in patients with T1-3N1 disease. Based on the new data, I recommend him to have 3 months (instead of 6 months) CAPEOX, he is very happy to hear the news.  -He is tolerating chemotherapy moderately well, has moderate fatigue and cold sensitivity. Lab results reviewed, adequate for treatment, we'll proceed to cycle 4 today -He will complete the last cycle chemotherapy in 2 weeks. We discussed cancer surveillance, I recommend him to follow-up with [place] every 3-4 months with for the first 2-3 years, then every 6-12 months for total of 5 years. I recommend restaging CT scan every 6-12 months for up to 5 years -I encouraged him to eat a healthy and exercise, and try to lose  some weight.  2. Genetics -per NCCN guideline, due to his young age and family history of pancreatic cancer, he is qualified for genetic testing to ruled out inheritable genetic syndromes. -His genetic testing was negative. APC VUS called [id number] found on the colorectal cancer panel.  The Colorectal Cancer Panel offered by [facility] includes sequencing and/or duplication/deletion testing of the following 19 genes: APC, ATM,  AXIN2, BMPR1A, CDH1, CHEK2, EPCAM, MLH1, MSH2, MSH6, MUTYH, PMS2, POLD1, POLE, PTEN, SCG5/GREM1, SMAD4, STK11, and TP53.  3. HTN, DM, CAD, morbid obesity -he will continue follow-up with his primary care physician -We will monitor his blood pressure, glucose closely during the chemotherapy, and may need to adjust his the medication if needed. -I strongly encouraged him to see Dr. Brigitte Pulse to adjust his diabetic medication due to his uncontrolled hyperglycemia and hypertension    4. History of alcohol abuse -He has stopped alcohol completely since he started chemotherapy.   5. Fatigue and neuropathy -Second to chemotherapy. Neuropathy resolved now -Nausea management was reviewed with him again. -We'll monitor his neuropathy closely.  Plan -Lab results reviewed, we'll proceed cycle 4 chemotherapy today (last cycle)  -I'll see him back in 3 months with CT scan and lab 1 week before. -I strongly encouraged him to see his primary care physician for his uncontrolled hypertension and hyperglycemia  All questions were answered. The patient knows to call the clinic with any problems, questions or concerns. I spent 20 minutes counseling the patient face to face. The total time spent in the appointment was 25 minutes and more than 50% was on counseling.     Truitt Merle, MD 05/02/2016

## 2016-05-08 ENCOUNTER — Telehealth: Payer: Self-pay | Admitting: *Deleted

## 2016-05-08 ENCOUNTER — Ambulatory Visit (HOSPITAL_BASED_OUTPATIENT_CLINIC_OR_DEPARTMENT_OTHER): Payer: BLUE CROSS/BLUE SHIELD | Admitting: Hematology

## 2016-05-08 ENCOUNTER — Encounter: Payer: Self-pay | Admitting: Hematology

## 2016-05-08 VITALS — BP 153/84 | HR 89 | Temp 98.6°F | Resp 18 | Ht 75.0 in | Wt >= 6400 oz

## 2016-05-08 DIAGNOSIS — I10 Essential (primary) hypertension: Secondary | ICD-10-CM

## 2016-05-08 DIAGNOSIS — F1021 Alcohol dependence, in remission: Secondary | ICD-10-CM

## 2016-05-08 DIAGNOSIS — C186 Malignant neoplasm of descending colon: Secondary | ICD-10-CM

## 2016-05-08 DIAGNOSIS — E1165 Type 2 diabetes mellitus with hyperglycemia: Secondary | ICD-10-CM

## 2016-05-08 DIAGNOSIS — I2510 Atherosclerotic heart disease of native coronary artery without angina pectoris: Secondary | ICD-10-CM

## 2016-05-08 DIAGNOSIS — G620 Drug-induced polyneuropathy: Secondary | ICD-10-CM | POA: Diagnosis not present

## 2016-05-08 DIAGNOSIS — E119 Type 2 diabetes mellitus without complications: Secondary | ICD-10-CM

## 2016-05-08 NOTE — Telephone Encounter (Signed)
  Oncology Nurse Navigator Documentation  Navigator Location: [facility] (05/08/16 1206)   )Navigator Encounter Type: Telephone (05/08/16 1206) Telephone: Jerilee Hoh Confirmation/Clarification (05/08/16 1206)   Called patient to discuss MRI-inquired if he needs the open scanner or can he go to [facility] for scan? He reports he must have the open scanner and needs the scan done as late in day as possible so he can work. Called [facility]: they will call him with the scan appointment-no opening there until week of 11/6.

## 2016-05-08 NOTE — Progress Notes (Signed)
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   Staging form: Colon and Rectum, AJCC 7th Edition     Pathologic stage from 10/18/2015: Stage IIIA (T2, N1a, cM0) - Signed by Truitt Merle, MD on 11/15/2015       Cancer of left colon ([place])   05/11/2015 Imaging    CT ABD/PELVIS: Gallstones w/sludge;liver prominent without focal liver lesion, mild thickening of bladder wall; negative for cancer      05/18/2015 Imaging    [place] ABD: Negative      07/25/2015 Initial Diagnosis    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 8 mm descending polyp and 5 mm rectosigmoid polyp;18-29 mm proximal rectal polyp      07/25/2015 Tumor Marker    CEA=0.7      07/26/2015 Pathology Results    Sigmoid mass: invasive colorectal adenocarcinoma      10/22/2015 Pathologic Stage    T2, N1, MO    #29 nodes examined w/ 1 postive node; moderately differentiated; Negative for Lymph-vascular and Peri-neural invasion; Microscopic extension into muscularis propria      10/26/2015 Pathology Results    MSI Stable      11/30/2015 - 02/08/2016 Chemotherapy    Adjuvant chemotherapy with Capecitabine 2500 mg twice daily, on day 1-14, oxaliplatin 130 mg/m, every 21 days      05/01/2016 Imaging    Surveillance  CT CAP scan showed new more focal low-density lesions in the liver, possible metastasis. No other new lesions.       HISTORY OF PRESENTING ILLNESS:  Chad [name] 45 y.o. male is here because of recently diagnosed colon cancer.  He is accompanied by his friend to the clinic today.  He presented with abdominal pain since Oct 2016,  Was seen in the emergency room on 05/11/2015,  CT abdomen and pelvis showed  Mild thickening of the urinary bladder wall, gallstones, otherwise negative. He was seen by her primary care physician Dr. Brigitte Pulse afterwards, who referred him to GI Dr. Carlean Purl.  His father passed away from [place] cancer around that time,  So his appointment was postponed, and he finally had colonoscopy in early January 2017, which showed multiple polyps, and  A ulcerated mass in distal sigmoid colon.  He was subsequently referred to colorectal surgeon Dr. Johney Maine, and  Eventually had left colectomy  On 11/01/2015.  He tolerated surgery well, and recovered well. He has good appetite good, BM is normal most of time, occasional diarrhea, likely second to food, mild low abdomen pain, which has improves, he lost about 30 lbs before surgery and 10lbs after surgery.   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  He is divorced, he has a 65 year old daughter who lives with her mother in [place]. He has sisters  and brothers, and her mother who live close by.   CURRENT THERAPY: Surveillance  INTERIM HISTORY: Chad [name] returns for follow-up. He completed adjuvant chemotherapy about 3 months ago. He still has residual numbness and tingling on his toes, no impact on his balance. His primary care physician Dr. Brigitte Pulse started him on Neurontin 300 mg at night, he is tolerating well. The neuropathy on his fingers, he has occasional diarrhea, no abdominal pain, bloating, nausea, or other symptoms. His appetite and energy level has improved, his back to work  full-time.  MEDICAL HISTORY:  Past Medical History:  Diagnosis Date  . Cancer [facility])    colon cancer  . Coronary artery disease   . Diabetes mellitus without complication ([place]) 01/3418   oral agents only (07/2015)  . ETOH abuse 2014  . Fatty liver 12/2014   on ultrasound. pt obese and abuses ETOH  . Gallstone 12/2014   GB stones and sludge on ultrasound and CT,  . GERD (gastroesophageal reflux disease)   . Hypertension   . Morbid obesity with BMI of 50.0-59.9, adult ([place]) 03/2013  . Rectal bleeding 05/2015   07/2015 colonoscopy with malignant appearing sigmoid mass. Additional rectal, descending, polyps  . Shoulder pain, bilateral    due to previous injury    SURGICAL HISTORY: Past Surgical History:  Procedure Laterality Date  . CARDIAC CATHETERIZATION N/A 04/06/2015   Procedure: Left Heart Cath and Coronary Angiography;  Surgeon: Belva Crome, MD;  Location: [facility];  Service: Cardiovascular;  Laterality: N/A;  . COLONOSCOPY N/A 07/25/2015   Procedure: COLONOSCOPY;  Surgeon: Gatha Mayer, MD;  Location: [facility];  Service: Gastroenterology;  Laterality: N/A;  . CYSTOSCOPY  10/18/2015   Procedure: CYSTOSCOPY FLEXIBLE URETHRAL DIALATION AND FOLEY PLACEMENT;  Surgeon: Alexis Frock, MD;  Location: [facility] OR[facility];  Service: Urology;;  . ESOPHAGOGASTRODUODENOSCOPY N/A 07/25/2015   Procedure: ESOPHAGOGASTRODUODENOSCOPY (EGD);  Surgeon: Gatha Mayer, MD;  Location: [facility];  Service: Gastroenterology;  Laterality: N/A;    SOCIAL HISTORY: Social History   Social History  . Marital status: Legally Separated    Spouse name: N/A  . Number of children: 1  . Years of education: N/A   Occupational History  . Manager    Social History Main Topics  . Smoking status: Never Smoker  . Smokeless tobacco: Current User     Comment: occasionally dips tobacco - has used for approx 20 yrs (12/21/15)  . Alcohol use Yes     Comment: heavy drinking for 10 years, 2-3  drinks of liqour a  night, has been cutting back.    . Drug use: No  . Sexual activity: Not on file   Other Topics Concern  . Not on file   Social History Narrative   Divorced, has 27 yo daughter lives in [place]   Lives alone   Manages chain of car wash dealers       FAMILY HISTORY: Family History  Problem Relation Age of Onset  . Diabetes Father   . Pancreatic cancer Father 68    former smoker, but had not smoked in 42 years  . Cancer Maternal Uncle     d. 18s; unknown type of cancer -metastatic throughout abdomen   . Other Brother     oldest brother has had a normal colonoscopy  . Heart attack Maternal Grandfather     d. 57-58; heavy smoker and drinker  . Heart attack Paternal Grandmother     d. early 64s  . Heart attack  Paternal Grandfather 52  . Heart failure Maternal Uncle   . Skin cancer Maternal Uncle 42    NOS type; was out in the sun a lot  . Brain cancer Maternal Uncle     NOS type; mother's maternal half-brother dx. 69-68  . Cirrhosis Paternal Uncle   . Cancer Paternal Uncle     (x2-3) uncles with NOS cancers; d. 19s    ALLERGIES:  has No Known Allergies.  MEDICATIONS:  Current Outpatient Prescriptions  Medication Sig Dispense Refill  . Ascorbic Acid (VITAMIN C) 1000 MG tablet Take 1,000 mg by mouth daily.    Marland Kitchen aspirin EC 81 MG tablet Take 1 tablet (81 mg total) by mouth daily. DO NOT TAKE AGAIN UNTIL 08/09/15    . carvedilol (COREG) 6.25 MG tablet Take 1 tablet (6.25 mg total) by mouth 2 (two) times daily with a meal. (Patient taking differently: Take 6.25 mg by mouth every morning. ) 180 tablet 1  . gabapentin (NEURONTIN) 300 MG capsule Take 1 capsule (300 mg total) by mouth at bedtime. 90 capsule 1  . glipiZIDE (GLUCOTROL XL) 5 MG 24 hr tablet Take 1 tablet (5 mg total) by mouth daily with breakfast. 60 tablet 0  . lisinopril-hydrochlorothiazide (PRINZIDE,ZESTORETIC) 20-25 MG tablet TAKE 1 TABLET BY MOUTH DAILY 90 tablet 0  . LORazepam (ATIVAN) 1 MG tablet Take 1 tablet (1 mg  total) by mouth at bedtime as needed for anxiety. 20 tablet 1  . metFORMIN (GLUCOPHAGE-XR) 500 MG 24 hr tablet Take 4 tablets once a day WITH BREAKFAST 360 tablet 3  . metFORMIN (GLUCOPHAGE-XR) 500 MG 24 hr tablet TAKE 4 TABLETS BY MOUTH EVERY MORNING WITH BREAKFAST 360 tablet 0  . omeprazole (PRILOSEC) 40 MG capsule TAKE 1 CAPSULE(40 MG) BY MOUTH DAILY 90 capsule 0   No current facility-administered medications for this visit.     REVIEW OF SYSTEMS:   Constitutional: Denies fevers, chills or abnormal night sweats Eyes: Denies blurriness of vision, double vision or watery eyes Ears, nose, mouth, throat, and face: Denies mucositis or sore throat Respiratory: Denies cough, dyspnea or wheezes Cardiovascular: Denies palpitation, chest discomfort or lower extremity swelling Gastrointestinal:  Denies nausea, heartburn or change in bowel habits Skin: Denies abnormal skin rashes Lymphatics: Denies new lymphadenopathy or easy bruising Neurological:Denies numbness, tingling or new weaknesses Behavioral/Psych: Mood is stable, no new changes  All other systems were reviewed with the patient and are negative.  PHYSICAL EXAMINATION: ECOG PERFORMANCE STATUS: 0  Vitals:   05/08/16 0852  BP: (!) 153/84  Pulse: 89  Resp: 18  Temp: 98.6 F (37 C)   Filed Weights   05/08/16 0852  Weight: (!) 409 lb 9.6 oz (185.8 kg)    GENERAL:alert, no distress and comfortable,  Morbidly obese. SKIN: skin color, texture, turgor are normal, no rashes or significant lesions EYES: normal, conjunctiva are pink and non-injected, sclera clear OROPHARYNX:no exudate, no erythema and lips, buccal mucosa, and tongue normal  NECK: supple, thyroid normal size, non-tender, without nodularity LYMPH:  no palpable lymphadenopathy in the cervical, axillary or inguinal LUNGS: clear to auscultation and percussion with normal breathing effort HEART: regular rate & rhythm and no murmurs and no lower extremity  edema ABDOMEN:abdomen soft,  Surgical incision and laparoscopic scars are well-healed. non-tender and normal bowel sounds Musculoskeletal:no cyanosis of digits and no clubbing  PSYCH: alert & oriented x 3 with fluent speech NEURO: no focal motor/sensory deficits  LABORATORY DATA:  I have reviewed the data as listed CBC Latest  Ref Rng & Units 05/01/2016 02/08/2016 01/11/2016  WBC 4.0 - 10.3 10e3/uL 8.0 8.5 8.4  Hemoglobin 13.0 - 17.1 g/dL 15.0 14.9 14.8  Hematocrit 38.4 - 49.9 % 44.6 42.9 44.1  Platelets 140 - 400 10e3/uL 221 185 213    CMP Latest Ref Rng & Units 05/01/2016 04/03/2016 02/08/2016  Glucose 70 - 140 mg/dl 210(H) 235(H) 271(H)  BUN 7.0 - 26.0 mg/dL 11.8 9 11.9  Creatinine 0.7 - 1.3 mg/dL 0.8 0.68 0.8  Sodium 136 - 145 mEq/L 140 137 137  Potassium 3.5 - 5.1 mEq/L 4.3 3.6 4.4  Chloride 98 - 110 mmol/L - 99 -  CO2 22 - 29 mEq/L _0 Calcium 8.4 - 10.4 mg/dL 9.3 8.9 9.6  Total Protein 6.4 - 8.3 g/dL 7.5 7.1 7.6  Total Bilirubin 0.20 - 1.20 mg/dL 0.80 1.0 0.72  Alkaline Phos 40 - 150 U/L 57 55 62  AST 5 - 34 U/L _1 ALT 0 - 55 U/L 36 23 31   CEA:  07/25/2015: 0.7 11/30/2015: 4.5 05/01/2016: 57.89 (64.7)    PATHOLOGY REPORT: Diagnosis 10/18/2015 1. Colon, segmental resection for tumor, rectosigmoid INFILTRATIVE MODERATELY DIFFERENTIATED ADENOCARCINOMA OF THE COLON (3.8 CM) THE TUMOR INVADES MUSCULARIS PROPRIA MARGINS OF RESECTION ARE NEGATIVE FOR TUMOR METASTATIC COLONIC ADENOCARCINOMA IN ONE OF TWENTY-NINE LYMPH NODES (1/29) 2. Colon, resection margin (donut), final distal margin BENIGN COLONIC TISSUE, NEGATIVE FOR CARCINOMA Microscopic Comment 1. COLON AND RECTUM (INCLUDING TRANS-ANAL RESECTION): Specimen: Rectosigmoid colon Procedure: Segmental resection Tumor site: Rectal colon Specimen integrity: Intact Macroscopic intactness of mesorectum: Not applicable: NA Complete: NA Near complete: x Incomplete: NA Cannot be determined (specify):  NA Macroscopic tumor perforation: Muscularis propria Invasive tumor: Maximum size: 3.8 cm Histologic type(s): Adenocarcinoma Histologic grade and differentiation: G2 G1: well differentiated/low grade G2: moderately differentiated/low grade G3: poorly differentiated/high grade G4: undifferentiated/high grade Type of polyp in which invasive carcinoma arose: Tubular adenoma Microscopic extension of invasive tumor:Muscularis propria Lymph-Vascular invasion: Negative Peri-neural invasion: Negative Tumor deposit(s) (discontinuous extramural extension): Negative Resection margins: Proximal margin: Negative Distal margin: Negative Circumferential (radial) (posterior ascending, posterior descending; lateral and posterior mid-rectum; and entire lower 1/3 rectum):Negative Mesenteric margin (sigmoid and transverse): Negative Distance closest margin (if all above margins negative): 4.8 cm Trans-anal resection margins only: Deep margin: NA Mucosal Margin: NA Distance closest mucosal margin (if negative): NA Treatment effect (neo-adjuvant therapy): NA Additional polyp(s): Hyperplastic poly Non-neoplastic findings: Unremarkable Lymph nodes: number examined 29; number positive: 1 Pathologic Staging: T2, N1, M0   Mismatch Repair (MMR) Protein Immunohistochemistry (IHC) IHC Expression Result: MLH1: Preserved nuclear expression (greater 50% tumor expression) MSH2: Preserved nuclear expression (greater 50% tumor expression) MSH6: Preserved nuclear expression (greater 50% tumor expression) PMS2: Preserved nuclear expression (greater 50% tumor expression) * Internal control demonstrates intact nuclear expression Interpretation: NORMAL     Diagnosis 07/25/2015 1. Colon, polyp(s), descending - TUBULAR ADENOMA. NO HIGH GRADE DYSPLASIA OR MALIGNANCY IDENTIFIED. 2. Colon, biopsy, distal sigmoid mass - INVASIVE COLORECTAL ADENOCARCINOMA. 3. Colon, polyp(s), small recto sigmoid - HYPERPLASTIC  POLYP. NO ADENOMATOUS CHANGE OR MALIGNANCY. 4. Rectum, polyp(s) - TUBULOVILLOUS ADENOMA. NO HIGH GRADE DYSPLASIA OR MALIGNANCY IDENTIFIED.   RADIOGRAPHIC STUDIES: I have personally reviewed the radiological images as listed and agreed with the findings in the report.  CT chest, abdomen and pelvis w contrast 05/01/2016  IMPRESSION: 1. Stable chest CT.  No evidence of thoracic metastatic disease. 2. Progressive heterogeneity of the liver could be due to heterogeneous steatosis. However, there are new more  focal low-density lesions which are worrisome for possible metastatic disease given the patient's history. Further evaluation with hepatic MRI recommended. 3. Resolving postsurgical retroperitoneal fluid collections status post partial colon resection and anastomosis.   COLONOSCOPY 07/25/2015 ENDOSCOPIC IMPRESSION: 1) 1/4 circumference firm ulcerated mass in distal sigmmoid - approx 22 cm from verge. Looks like cancer. Biopsied. 2) 8 mm descending polyp and 5 mm rectosigmoid polyp removed cold snare, completely recovered and sent to path. 3) 18-20 mm pedunculated proximal rectal polyp removed hot snare and completely recovered for pathology. 4) Otherwise normal colonoscopy RECOMMENDATIONS: 1. Hold Aspirin and all other NSAIDS for 2 weeks. 2. Will call pathology results and plans though anticipate surgical referral after pathology in. CEA will be drawn at hospital today. He has had recent CT's of chest, and abd/pelvis - will see if he needs new ones - he may.  EGD 07/25/2015 ENDOSCOPIC IMPRESSION: Normal appearing esophagus and GE junction, the stomach was well visualized and normal in appearance, normal appearing duodenum  ASSESSMENT & PLAN:  45 year old Caucasian male, with past medical history of heavy alcohol drinking, hypertension, diabetes, coronary artery disease, presented with abdominal pain.  1.  Cancer of left colon, Distal sigmoid, invasive adenocarcinoma, G2,  pT2N1aM0, stage IIIA, MSI-stable -I reviewed his CT scan findings, and the surgical pathology findings include details with patient. -he has locally advanced sigmoid colon cancer, with one out of 29 lymph nodes positive. -We discussed the risk of cancer recurrence after's complete surgical resection. Giving his stage IIIa disease, he is at moderate to high risk for recurrence. -We discussed the standard care for stage III colon cancer is adjuvant chemotherapy. Given his young age, I recommend combined chemotherapy, FOLFOX or CAPEOX. Due to his work schedule, he opted CAPEOX. -We discussed the new data from pooled 6 phase 3 trial investigating duration of adjuvant oxaliplatin-based therapy (3 versus 6 months) for stage III colon cancer which was reported in ASCO 2017 recently, showed similar three-year disease-free survival of 3 months versus 6 months in patients with T1-3N1 disease. Based on the new data, I recommend him to have 3 months (instead of 6 months) CAPEOX, he is very happy to hear the news.  -He has completed 3 months of adjuvant chemotherapy, tolerated well. -I discussed his surveillance CT scan, which unfortunately showed multiple liver lesions, suspicious for metastasis. I'll order a abdominal MRI with and without contrast for further evaluation, if appears suspicious on MRI, I recommend him to have a liver biopsy. He is agreeable. -His CEA level has significantly increased lately, which is concerning for cancer recurrence. -We briefly discussed chemotherapy and liver targeted therapy for recurrent colon cancer.  2. Genetics -per NCCN guideline, due to his young age and family history of pancreatic cancer, he is qualified for genetic testing to ruled out inheritable genetic syndromes. -His genetic testing was negative. APC VUS called [id number] found on the colorectal cancer panel.  The Colorectal Cancer Panel offered by [facility] includes sequencing and/or duplication/deletion testing of the  following 19 genes: APC, ATM, AXIN2, BMPR1A, CDH1, CHEK2, EPCAM, MLH1, MSH2, MSH6, MUTYH, PMS2, POLD1, POLE, PTEN, SCG5/GREM1, SMAD4, STK11, and TP53.  3. HTN, DM, CAD, morbid obesity -he will continue follow-up with his primary care physician -We will monitor his blood pressure, glucose closely during the chemotherapy, and may need to adjust his the medication if needed. -I strongly encouraged him to see Dr. Brigitte Pulse to adjust his diabetic medication due to his uncontrolled hyperglycemia and hypertension    4. History of alcohol  abuse -He has stopped alcohol completely since he started chemotherapy.   5. Fatigue and neuropathy -His fatigue is much improved -He has started Neurontin 300 mg at night for neuropathy, tolerating well.  Plan -Abdominal MRI with and without contrast to further eval to history of radiation, he will call me after his MRI, and we'll decide if he needs liver biopsy by interventional radiology -I'll see him back in 3 weeks, likely after his liver biopsy.  All questions were answered. The patient knows to call the clinic with any problems, questions or concerns. I spent 20 minutes counseling the patient face to face. The total time spent in the appointment was 25 minutes and more than 50% was on counseling.     Truitt Merle, MD 05/08/2016

## 2016-05-13 NOTE — Progress Notes (Signed)
GENETIC TEST RESULT  HPI: Mr. Peggs was previously seen in the [place] clinic on December 21, 2015 due to a personal history of colon cancer at 41, family history of pancreatic and other cancers, and concerns regarding a hereditary predisposition to cancer. Please refer to our prior cancer genetics clinic note from 12/21/15 for more information regarding Mr. Sult medical, social and family histories, and our assessment and recommendations, at the time. Mr. Kellman recent genetic test results were disclosed to him, as were recommendations warranted by these results. These results and recommendations are discussed in more detail below.  GENETIC TEST RESULTS: At the time of Mr. Rayner visit on 12/21/15, we recommended he pursue genetic testing of the 19-gene Colorectal Cancer Panel with MSH2 Exons 1-7 Inversion Analysis through [facility] [employer] [facility].  The Colorectal Cancer Panel offered by [facility] includes sequencing and/or duplication/deletion testing of the following 19 genes: APC, ATM, AXIN2, BMPR1A, CDH1, CHEK2, EPCAM, MLH1, MSH2, MSH6, MUTYH, PMS2, POLD1, POLE, PTEN, SCG5/GREM1, SMAD4, STK11, and TP53.  Those results are now back, the report date for which is January 19, 2016.  Genetic testing was normal, and did not reveal a deleterious mutation in these genes.  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  The test report will be scanned into EPIC and will be located under the Results Review tab in the Pathology>Molecular Pathology section.   Genetic testing did identify a variant of uncertain significance (VUS) called "[id number] ([id number])" in one copy of the APC gene. At this time, it is unknown if this VUS is associated with an increased risk for cancer or if this is a normal finding. Since this VUS result is uncertain, it cannot help guide screening recommendations, and family members should not be tested for this VUS to help define their own cancer  risks.  Also, we all have variants within our genes that make [place] unique individuals--most of these variants are benign.  Thus, we treat this VUS as a negative result, until it gets updated by the lab.   With time, we suspect the lab will reclassify this variant and when they do, we will try to re-contact Mr. Peifer to discuss the reclassification further.  We also encouraged Mr. Vanwingerden to contact us in a year or two to obtain an update on the status of this VUS.  We discussed with Mr. Kent that since the current genetic testing is not perfect, it is possible there may be a gene mutation in one of these genes that current testing cannot detect, but that chance is small. We also discussed, that it is possible that another gene that has not yet been discovered, or that we have not yet tested, is responsible for the cancer diagnoses in the family, and it is, therefore, important to remain in touch with cancer genetics in the future so that we can continue to offer Mr. Trella the most up-to-date genetic testing.   CANCER SCREENING RECOMMENDATIONS: This result is reassuring and indicates that Mr. Gheen likely does not have an increased risk for a future cancer due to a mutation in one of these genes. This normal test also suggests that Mr. Skare cancer was most likely not due to an inherited predisposition associated with one of these genes.  Most cancers happen by chance and this negative test suggests that his cancer falls into this category.  We, therefore, recommended he continue to follow the cancer management and screening guidelines provided by his oncology and  primary healthcare providers.    RECOMMENDATIONS FOR FAMILY MEMBERS: Men and women in this family might be at some increased risk of developing cancer, over the general population risk, simply due to the family history of cancer. We recommended women in this family have a yearly mammogram beginning at age 40, or 55 years younger than the  earliest onset of cancer, an annual clinical breast exam, and perform monthly breast self-exams. Women in this family should also have a gynecological exam as recommended by their primary provider. All family members should have a colonoscopy by age 53.  Mr. Sturdevant siblings should be getting their colonoscopies now.  Based on current guidelines, Mr. Rackley daughter can get her first colonoscopy at the age of 47.  FOLLOW-UP: Lastly, we discussed with Mr. Congrove that cancer genetics is a rapidly advancing field and it is possible that new genetic tests will be appropriate for him and/or his family members in the future. We encouraged him to remain in contact with cancer genetics on an annual basis so we can update his personal and family histories and let him know of advances in cancer genetics that may benefit this family.   Our contact number was provided. Mr. Sek questions were answered to his satisfaction, and she knows he is welcome to call us at anytime with additional questions or concerns.   Jeanine Luz, MS, [facility] Certified Genetic Counselor [email or web address] Phone: [phone]

## 2016-05-21 ENCOUNTER — Ambulatory Visit
Admission: RE | Admit: 2016-05-21 | Discharge: 2016-05-21 | Disposition: A | Discharge status: Home / Self Care | Payer: BLUE CROSS/BLUE SHIELD | Source: Ambulatory Visit | Attending: Hematology | Admitting: Hematology

## 2016-05-21 ENCOUNTER — Other Ambulatory Visit: Payer: Self-pay | Admitting: Family Medicine

## 2016-05-21 DIAGNOSIS — C186 Malignant neoplasm of descending colon: Secondary | ICD-10-CM

## 2016-05-26 ENCOUNTER — Other Ambulatory Visit: Payer: Self-pay | Admitting: Physician Assistant

## 2016-05-26 ENCOUNTER — Other Ambulatory Visit: Payer: Self-pay | Admitting: Family Medicine

## 2016-05-27 MED ORDER — DIAZEPAM 2 MG PO TABS: 2.0000 mg | ORAL_TABLET | ORAL | 0 refills | Status: DC

## 2016-05-27 NOTE — Telephone Encounter (Signed)
Called to pharmacy 

## 2016-05-27 NOTE — Addendum Note (Signed)
Addended by: Tania Ade on: 05/27/2016 02:47 PM   Modules accepted: Orders

## 2016-05-27 NOTE — Telephone Encounter (Signed)
Oncology Nurse Navigator Documentation  Oncology Nurse Navigator Flowsheets 05/27/2016  Navigator Location [facility]  Referral date to RadOnc/MedOnc -  Navigator Encounter Type Telephone  Telephone Outgoing Call;Appt Confirmation/Clarification  Abnormal Finding Date -  Confirmed Diagnosis Date -  Surgery Date -  Treatment Initiated Date -  Patient Visit Type -  Treatment Phase -  Barriers/Navigation Needs Coordination of Care--due to MD strong encouragement, patient agreed to move his MRI up to 11/22 at 7pm/7:15. He is aware of being NPO for 4 hours prior and that he needs a driver if taking valium  Education -  Interventions Coordination of Care--MRI--confirmed w/radiology it needs to be the largest open MRI scanner they have. Spoke w/Amber.  Coordination of Care Radiology;Other--called in Valium 2 mg premed for his scan.  Education Method Verbal;Teach-back  Support Groups/Services -  Acuity Level 2  Time Spent with Patient 30

## 2016-06-05 ENCOUNTER — Ambulatory Visit
Admission: RE | Admit: 2016-06-05 | Discharge: 2016-06-05 | Disposition: A | Discharge status: Home / Self Care | Payer: BLUE CROSS/BLUE SHIELD | Source: Ambulatory Visit | Attending: Hematology | Admitting: Hematology

## 2016-06-05 ENCOUNTER — Ambulatory Visit: Payer: BLUE CROSS/BLUE SHIELD | Admitting: Hematology

## 2016-06-05 MED ORDER — GADOBENATE DIMEGLUMINE 529 MG/ML IV SOLN
20.0000 mL | Freq: Once | INTRAVENOUS | Status: AC | PRN
  Administered 2016-06-05: 20 mL via INTRAVENOUS

## 2016-06-07 ENCOUNTER — Telehealth: Payer: Self-pay | Admitting: Hematology

## 2016-06-07 NOTE — Telephone Encounter (Signed)
lvm to inform pt of r/s 11/30 appt to 11/27 per LOS

## 2016-06-10 ENCOUNTER — Other Ambulatory Visit: Payer: BLUE CROSS/BLUE SHIELD

## 2016-06-10 ENCOUNTER — Ambulatory Visit: Payer: BLUE CROSS/BLUE SHIELD | Admitting: Hematology

## 2016-06-11 ENCOUNTER — Other Ambulatory Visit: Payer: BLUE CROSS/BLUE SHIELD

## 2016-06-11 ENCOUNTER — Ambulatory Visit: Payer: BLUE CROSS/BLUE SHIELD | Admitting: Hematology

## 2016-06-11 ENCOUNTER — Ambulatory Visit (HOSPITAL_BASED_OUTPATIENT_CLINIC_OR_DEPARTMENT_OTHER): Payer: BLUE CROSS/BLUE SHIELD | Admitting: Hematology

## 2016-06-11 ENCOUNTER — Encounter: Payer: Self-pay | Admitting: Hematology

## 2016-06-11 VITALS — BP 164/81 | HR 92 | Temp 97.5°F | Resp 20 | Ht 75.0 in | Wt >= 6400 oz

## 2016-06-11 DIAGNOSIS — E119 Type 2 diabetes mellitus without complications: Secondary | ICD-10-CM | POA: Diagnosis not present

## 2016-06-11 DIAGNOSIS — C186 Malignant neoplasm of descending colon: Secondary | ICD-10-CM

## 2016-06-11 DIAGNOSIS — I2510 Atherosclerotic heart disease of native coronary artery without angina pectoris: Secondary | ICD-10-CM

## 2016-06-11 DIAGNOSIS — G620 Drug-induced polyneuropathy: Secondary | ICD-10-CM | POA: Diagnosis not present

## 2016-06-11 DIAGNOSIS — I10 Essential (primary) hypertension: Secondary | ICD-10-CM

## 2016-06-11 DIAGNOSIS — F1010 Alcohol abuse, uncomplicated: Secondary | ICD-10-CM

## 2016-06-11 NOTE — Progress Notes (Signed)
[place]  Telephone:(336) [phone] Fax:(336) [phone]  Clinic Follow up Note   Patient Care Team: Chad Knapp, MD as PCP - General (Family Medicine) Chad Boston, MD as Consulting Physician (General Surgery) Chad Records, MD as Consulting Physician (Cardiology) Chad Mayer, MD as Consulting Physician (Gastroenterology) Chad Frock, MD as Consulting Physician (Urology) Chad Ade, RN as Registered Nurse 06/11/2016     CHIEF COMPLAINTS: Follow up colon [name]   Oncology History   Presented w/intermittent rectal bleeding and abdominal pain  [name] of rectosigmoid junction T2N1 (1/29 LN) s/p robotic LAR resection 10/18/2015   Staging form: Colon and Rectum, AJCC 7th Edition     Pathologic stage from 10/18/2015: Stage IIIA (T2, N1a, cM0) - Signed by Chad Merle, MD on 11/15/2015       [name] of left colon ([place])   05/11/2015 Imaging    CT ABD/PELVIS: Gallstones w/sludge;liver prominent without focal liver lesion, mild thickening of bladder wall; negative for [name]      05/18/2015 Imaging    [place] ABD: Negative      07/25/2015 Initial Diagnosis    [name] of rectosigmoid junction T2N1 (1/29 LN) s/p robotic LAR resection 10/18/2015      07/25/2015 Procedure    COLONOSCOPY: 1/4 circumference ulcerate mass in distal sigmoid-22 cm from verge. 8 mm descending polyp and 5 mm rectosigmoid polyp;18-29 mm proximal rectal polyp      07/25/2015 Tumor Marker    CEA=0.7      07/26/2015 Pathology Results    Sigmoid mass: invasive colorectal adenocarcinoma      10/22/2015 Pathologic Stage    T2, N1, MO    #29 nodes examined w/ 1 postive node; moderately differentiated; Negative for Lymph-vascular and Peri-neural invasion; Microscopic extension into muscularis propria      10/26/2015 Pathology Results    MSI Stable      11/30/2015 - 02/08/2016 Chemotherapy    Adjuvant chemotherapy with Capecitabine 2500 mg twice daily, on day 1-14, oxaliplatin 130 mg/m, every 21 days      05/01/2016 Imaging    Surveillance CT CAP scan showed new more focal low-density lesions in the liver, possible metastasis. No other new lesions.       HISTORY OF PRESENTING ILLNESS:  Chad [name] 45 y.o. male is here because of recently diagnosed colon [name].  He is accompanied by his friend to the clinic today.  He presented with abdominal pain since Oct 2016,  Was seen in the emergency room on 05/11/2015,  CT abdomen and pelvis showed  Mild thickening of the urinary bladder wall, gallstones, otherwise negative. He was seen by her primary care physician Dr. Brigitte [name] afterwards, who referred him to GI Dr. Carlean [name].  His father passed away from Chad [name] around that time,  So his appointment was postponed, and he finally had colonoscopy in early January 2017, which showed multiple polyps, and  A ulcerated mass in distal sigmoid colon.  He was subsequently referred to colorectal surgeon Dr. Johney [name], and  Eventually had left colectomy  On 11/01/2015.  He tolerated surgery well, and recovered well. He has good appetite good, BM is normal most of time, occasional diarrhea, likely second to food, mild low abdomen pain, which has improves, he lost about 30 lbs before surgery and 10lbs after surgery.   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  He is divorced, he has a 60 year old daughter who lives with her mother  [place]. He has sisters and brothers, and her mother who live close by.   CURRENT THERAPY: Surveillance  INTERIM HISTORY: [name] returns for follow-up and discuss scan result. he has claustrophobia, and could not do MRI at fist time. He finally did MRI on a open scanner, with value as premedication. He is here to discuss the scan findings. He feels well, denies any pain, nausea, or other symptoms. He works full-time, lives alone, does not have much support and system.  MEDICAL HISTORY:  Past Medical History:  Diagnosis Date  .  [name] [facility])    colon [name]  . Coronary artery disease   . Diabetes mellitus without complication ([place]) 10/8544   oral agents only (07/2015)  . ETOH abuse 2014  . Fatty liver 12/2014   on ultrasound. pt obese and abuses ETOH  . Gallstone 12/2014   GB stones and sludge on ultrasound and CT,  . GERD (gastroesophageal reflux disease)   . Hypertension   . Morbid obesity with BMI of 50.0-59.9, adult ([place]) 03/2013  . Rectal bleeding 05/2015   07/2015 colonoscopy with malignant appearing sigmoid mass. Additional rectal, descending, polyps  . Shoulder pain, bilateral    due to previous injury    SURGICAL HISTORY: Past Surgical History:  Procedure Laterality Date  . CARDIAC CATHETERIZATION N/A 04/06/2015   Procedure: Left Heart Cath and Coronary Angiography;  Surgeon: Chad Crome, MD;  Location: [place] [facility] LAB;  Service: Cardiovascular;  Laterality: N/A;  . COLONOSCOPY N/A 07/25/2015   Procedure: COLONOSCOPY;  Surgeon: Chad Mayer, MD;  Location: [facility] ENDOSCOPY;  Service: Gastroenterology;  Laterality: N/A;  . CYSTOSCOPY  10/18/2015   Procedure: CYSTOSCOPY FLEXIBLE URETHRAL DIALATION AND FOLEY PLACEMENT;  Surgeon: Chad Frock, MD;  Location: [facility] ORS;  Service: Urology;;  . ESOPHAGOGASTRODUODENOSCOPY N/A 07/25/2015   Procedure: ESOPHAGOGASTRODUODENOSCOPY (EGD);  Surgeon: Chad Mayer, MD;  Location: [facility];  Service: Gastroenterology;  Laterality: N/A;    SOCIAL HISTORY: Social History   Social History  . Marital status: Legally Separated    Spouse name: N/A  . Number of children: 1  . Years of education: N/A   Occupational History  . Manager    Social History Main Topics  . Smoking status: Never Smoker  . Smokeless tobacco: Current User     Comment: occasionally dips tobacco - has used for approx 20 yrs (12/21/15)  . Alcohol use Yes     Comment: heavy drinking for 10 years, 2-3  drinks of liqour a night, has been cutting back.    . Drug use: No  . Sexual activity: Not  on file   Other Topics Concern  . Not on file   Social History Narrative   Divorced, has 21 yo daughter lives in [place]   Lives alone   Manages chain of car wash dealers       FAMILY HISTORY: Family History  Problem Relation Age of Onset  . Diabetes Father   . Pancreatic [name] Father 83    former smoker, but had not smoked in 68 years  . [name] Maternal Uncle     d. 58s; unknown type of [name] -metastatic throughout abdomen   . Other Brother     oldest brother has had a normal colonoscopy  . Heart attack Maternal Grandfather     d. 57-58; heavy smoker and drinker  . Heart attack Paternal Grandmother     d. early 33s  . Heart attack Paternal Grandfather 4  . Heart failure  Maternal Uncle   . Skin [name] Maternal Uncle 42    NOS type; was out in the sun a lot  . Brain [name] Maternal Uncle     NOS type; mother's maternal half-brother dx. 30-68  . Cirrhosis Paternal Uncle   . [name] Paternal Uncle     (x2-3) uncles with NOS cancers; d. 35s    ALLERGIES:  has No Known Allergies.  MEDICATIONS:  Current Outpatient Prescriptions  Medication Sig Dispense Refill  . Ascorbic Acid (VITAMIN C) 1000 MG tablet Take 1,000 mg by mouth daily.    Marland Kitchen aspirin EC 81 MG tablet Take 1 tablet (81 mg total) by mouth daily. DO NOT TAKE AGAIN UNTIL 08/09/15    . carvedilol (COREG) 6.25 MG tablet Take 1 tablet (6.25 mg total) by mouth 2 (two) times daily with a meal. (Patient taking differently: Take 6.25 mg by mouth every morning. ) 180 tablet 1  . gabapentin (NEURONTIN) 300 MG capsule Take 1 capsule (300 mg total) by mouth at bedtime. 90 capsule 1  . glipiZIDE (GLUCOTROL XL) 5 MG 24 hr tablet Take 1 tablet (5 mg total) by mouth daily with breakfast. 90 tablet 0  . lisinopril-hydrochlorothiazide (PRINZIDE,ZESTORETIC) 20-25 MG tablet TAKE 1 TABLET BY MOUTH DAILY 90 tablet 0  . LORazepam (ATIVAN) 1 MG tablet TAKE 1 TABLET BY MOUTH AT BEDTIME AS NEEDED FOR ANXIETY 20 tablet 0  . metFORMIN  (GLUCOPHAGE-XR) 500 MG 24 hr tablet Take 4 tablets once a day WITH BREAKFAST 360 tablet 3  . omeprazole (PRILOSEC) 40 MG capsule TAKE 1 CAPSULE(40 MG) BY MOUTH DAILY 90 capsule 0  . diazepam (VALIUM) 2 MG tablet Take 1 tablet (2 mg total) by mouth as directed. Take on 30 minutes before MRI and repeat if needed (Patient not taking: Reported on 06/11/2016) 2 tablet 0   No current facility-administered medications for this visit.     REVIEW OF SYSTEMS:   Constitutional: Denies fevers, chills or abnormal night sweats Eyes: Denies blurriness of vision, double vision or watery eyes Ears, nose, mouth, throat, and face: Denies mucositis or sore throat Respiratory: Denies cough, dyspnea or wheezes Cardiovascular: Denies palpitation, chest discomfort or lower extremity swelling Gastrointestinal:  Denies nausea, heartburn or change in bowel habits Skin: Denies abnormal skin rashes Lymphatics: Denies new lymphadenopathy or easy bruising Neurological:Denies numbness, tingling or new weaknesses Behavioral/Psych: Mood is stable, no new changes  All other systems were reviewed with the patient and are negative.  PHYSICAL EXAMINATION: ECOG PERFORMANCE STATUS: 0  Vitals:   06/11/16 1538  BP: (!) 164/81  [name]: 92  Resp: 20  Temp: 97.5 F (36.4 C)   Filed Weights   06/11/16 1538  Weight: (!) 405 lb 4.8 oz (183.8 kg)    GENERAL:alert, no distress and comfortable,  Morbidly obese. SKIN: skin color, texture, turgor are normal, no rashes or significant lesions EYES: normal, conjunctiva are pink and non-injected, sclera clear OROPHARYNX:no exudate, no erythema and lips, buccal mucosa, and tongue normal  NECK: supple, thyroid normal size, non-tender, without nodularity LYMPH:  no palpable lymphadenopathy in the cervical, axillary or inguinal LUNGS: clear to auscultation and percussion with normal breathing effort HEART: regular rate & rhythm and no murmurs and no lower extremity  edema ABDOMEN:abdomen soft,  Surgical incision and laparoscopic scars are well-healed. non-tender and normal bowel sounds Musculoskeletal:no cyanosis of digits and no clubbing  PSYCH: alert & oriented x 3 with fluent speech NEURO: no focal motor/sensory deficits  LABORATORY DATA:  I have reviewed the  data as listed CBC Latest Ref Rng & Units 05/01/2016 02/08/2016 01/11/2016  WBC 4.0 - 10.3 10e3/uL 8.0 8.5 8.4  Hemoglobin 13.0 - 17.1 g/dL 15.0 14.9 14.8  Hematocrit 38.4 - 49.9 % 44.6 42.9 44.1  Platelets 140 - 400 10e3/uL 221 185 213    CMP Latest Ref Rng & Units 05/01/2016 04/03/2016 02/08/2016  Glucose 70 - 140 mg/dl 210(H) 235(H) 271(H)  BUN 7.0 - 26.0 mg/dL 11.8 9 11.9  Creatinine 0.7 - 1.3 mg/dL 0.8 0.68 0.8  Sodium 136 - 145 mEq/L 140 137 137  Potassium 3.5 - 5.1 mEq/L 4.3 3.6 4.4  Chloride 98 - 110 mmol/L - 99 -  CO2 22 - 29 mEq/L '27 24 22  ' Calcium 8.4 - 10.4 mg/dL 9.3 8.9 9.6  Total Protein 6.4 - 8.3 g/dL 7.5 7.1 7.6  Total Bilirubin 0.20 - 1.20 mg/dL 0.80 1.0 0.72  Alkaline Phos 40 - 150 U/L 57 55 62  AST 5 - 34 U/L '31 19 25  ' ALT 0 - 55 U/L 36 23 31   CEA:  07/25/2015: 0.7 11/30/2015: 4.5 05/01/2016: 57.89 (64.7)    PATHOLOGY REPORT: Diagnosis 10/18/2015 1. Colon, segmental resection for tumor, rectosigmoid INFILTRATIVE MODERATELY DIFFERENTIATED ADENOCARCINOMA OF THE COLON (3.8 CM) THE TUMOR INVADES MUSCULARIS PROPRIA MARGINS OF RESECTION ARE NEGATIVE FOR TUMOR METASTATIC COLONIC ADENOCARCINOMA IN ONE OF TWENTY-NINE LYMPH NODES (1/29) 2. Colon, resection margin (donut), final distal margin BENIGN COLONIC TISSUE, NEGATIVE FOR CARCINOMA Microscopic Comment 1. COLON AND RECTUM (INCLUDING TRANS-ANAL RESECTION): Specimen: Rectosigmoid colon Procedure: Segmental resection Tumor site: Rectal colon Specimen integrity: Intact Macroscopic intactness of mesorectum: Not applicable: NA Complete: NA Near complete: x Incomplete: NA Cannot be determined (specify):  NA Macroscopic tumor perforation: Muscularis propria Invasive tumor: Maximum size: 3.8 cm Histologic type(s): Adenocarcinoma Histologic grade and differentiation: G2 G1: well differentiated/low grade G2: moderately differentiated/low grade G3: poorly differentiated/high grade G4: undifferentiated/high grade Type of polyp in which invasive carcinoma arose: Tubular adenoma Microscopic extension of invasive tumor:Muscularis propria Lymph-Vascular invasion: Negative Peri-neural invasion: Negative Tumor deposit(s) (discontinuous extramural extension): Negative Resection margins: Proximal margin: Negative Distal margin: Negative Circumferential (radial) (posterior ascending, posterior descending; lateral and posterior mid-rectum; and entire lower 1/3 rectum):Negative Mesenteric margin (sigmoid and transverse): Negative Distance closest margin (if all above margins negative): 4.8 cm Trans-anal resection margins only: Deep margin: NA Mucosal Margin: NA Distance closest mucosal margin (if negative): NA Treatment effect (neo-adjuvant therapy): NA Additional polyp(s): Hyperplastic poly Non-neoplastic findings: Unremarkable Lymph nodes: number examined 29; number positive: 1 Pathologic Staging: T2, N1, M0   Mismatch Repair (MMR) Protein Immunohistochemistry (IHC) IHC Expression Result: MLH1: Preserved nuclear expression (greater 50% tumor expression) MSH2: Preserved nuclear expression (greater 50% tumor expression) MSH6: Preserved nuclear expression (greater 50% tumor expression) PMS2: Preserved nuclear expression (greater 50% tumor expression) * Internal control demonstrates intact nuclear expression Interpretation: NORMAL     Diagnosis 07/25/2015 1. Colon, polyp(s), descending - TUBULAR ADENOMA. NO HIGH GRADE DYSPLASIA OR MALIGNANCY IDENTIFIED. 2. Colon, biopsy, distal sigmoid mass - INVASIVE COLORECTAL ADENOCARCINOMA. 3. Colon, polyp(s), small recto sigmoid - HYPERPLASTIC  POLYP. NO ADENOMATOUS CHANGE OR MALIGNANCY. 4. Rectum, polyp(s) - TUBULOVILLOUS ADENOMA. NO HIGH GRADE DYSPLASIA OR MALIGNANCY IDENTIFIED.   RADIOGRAPHIC STUDIES: I have personally reviewed the radiological images as listed and agreed with the findings in the report.  CT chest, abdomen and pelvis w contrast 05/01/2016  IMPRESSION: 1. Stable chest CT.  No evidence of thoracic metastatic disease. 2. Progressive heterogeneity of the liver could be due to heterogeneous steatosis.  However, there are new more focal low-density lesions which are worrisome for possible metastatic disease given the patient's history. Further evaluation with hepatic MRI recommended. 3. Resolving postsurgical retroperitoneal fluid collections status post partial colon resection and anastomosis.  Abdominal MRI w wo contrast 06/05/2016 IMPRESSION: 1. 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 2. No additional metastatic disease in the upper abdomen. 3. Cholelithiasis.   COLONOSCOPY 07/25/2015 ENDOSCOPIC IMPRESSION: 1) 1/4 circumference firm ulcerated mass in distal sigmmoid - approx 22 cm from verge. Looks like [name]. Biopsied. 2) 8 mm descending polyp and 5 mm rectosigmoid polyp removed cold snare, completely recovered and sent to path. 3) 18-20 mm pedunculated proximal rectal polyp removed hot snare and completely recovered for pathology. 4) Otherwise normal colonoscopy RECOMMENDATIONS: 1. Hold Aspirin and all other NSAIDS for 2 weeks. 2. Will call pathology results and plans though anticipate surgical referral after pathology in. CEA will be drawn at hospital today. He has had recent CT's of chest, and abd/pelvis - will see if he needs new ones - he may.  EGD 07/25/2015 ENDOSCOPIC IMPRESSION: Normal appearing esophagus and GE junction, the stomach was well visualized and normal in appearance, normal appearing duodenum  ASSESSMENT & PLAN:  45 year old Caucasian male, with past  medical history of heavy alcohol drinking, hypertension, diabetes, coronary artery disease, presented with abdominal pain.  1.  [name] of left colon, Distal sigmoid, invasive adenocarcinoma, G2, pT2N1aM0, stage IIIA, MSI-stable, liver metastasis in 04/2016 -I initially reviewed his CT scan findings, and the surgical pathology findings include details with patient. -he has locally advanced sigmoid colon [name], with one out of 29 lymph nodes positive. -We discussed the risk of [name] recurrence after's complete surgical resection. Giving his stage IIIa disease, he is at moderate to high risk for recurrence. -He has completed 3 months of adjuvant chemotherapy CAPOX, tolerated well. -I discussed his surveillance CT scan, which unfortunately showed multiple liver lesions, suspicious for metastasis. -His CEA level has significantly increased lately, which is concerning for [name] recurrence. -I discussed his abdominal MRI findings, and personally reviewed the image with patient, unfortunately MRI confirmed at least a 6 metastatic lesions in the liver, largest 4.2 cm. Giving the multiple liver metastasis, this is likely incurable metastatic [name]. -I recommend CT or ultrasound-guided liver mass biopsy to confirm metastasis, and Tissue for Foundation one test. Patient is agreeable. We'll arrange a biopsy through interventional radiology -We discussed chemotherapy options if the biopsy confirms metastasis. Due to his work schedule and previous good tolerance to CAPOX, he prefers to do CAPOX. I'll tentatively schedule him to start in 2-3 weeks. -He declined port placement for now.  2. Genetics -per NCCN guideline, due to his young age and family history of pancreatic [name], he is qualified for genetic testing to ruled out inheritable genetic syndromes. -His genetic testing was negative. APC VUS called [id number] found on the colorectal [name] panel.  The Colorectal [name] Panel offered by [facility] includes  sequencing and/or duplication/deletion testing of the following 19 genes: APC, ATM, AXIN2, BMPR1A, CDH1, CHEK2, EPCAM, MLH1, MSH2, MSH6, MUTYH, PMS2, POLD1, POLE, PTEN, SCG5/GREM1, SMAD4, STK11, and TP53.  3. HTN, DM, CAD, morbid obesity -he will continue follow-up with his primary care physician -We will monitor his blood pressure, glucose closely during the chemotherapy, and may need to adjust his the medication if needed. -I strongly encouraged him to see Dr. Brigitte [name] to adjust his diabetic medication due to his uncontrolled hyperglycemia and hypertension    4. History of alcohol abuse -He  has stopped alcohol completely since he started chemotherapy.   5. 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  -Near resolved now  Plan -Abdominal MRI scan findings were reviewed with patient -Liver mass biopsy by IR in the next week -Lab, follow-up and first dose oxaliplatin in 2-3 weeks  -I will send a prescription of Xeloda to his specialty pharmacy after his liver biopsy.  All questions were answered. The patient knows to call the clinic with any problems, questions or concerns. I spent 20 minutes counseling the patient face to face. The total time spent in the appointment was 25 minutes and more than 50% was on counseling.     Chad Merle, MD 06/11/2016

## 2016-06-12 ENCOUNTER — Other Ambulatory Visit: Payer: Self-pay | Admitting: Family Medicine

## 2016-06-13 ENCOUNTER — Ambulatory Visit: Payer: BLUE CROSS/BLUE SHIELD | Admitting: Hematology

## 2016-06-13 ENCOUNTER — Other Ambulatory Visit: Payer: BLUE CROSS/BLUE SHIELD

## 2016-06-20 ENCOUNTER — Other Ambulatory Visit: Payer: Self-pay

## 2016-06-20 NOTE — Telephone Encounter (Signed)
Pharm reqs Rf of lorazepam. Dr Brigitte Pulse you saw pt last on 9/20, last Rx 11/13 for #20.

## 2016-06-21 ENCOUNTER — Other Ambulatory Visit: Payer: Self-pay | Admitting: General Surgery

## 2016-06-21 MED ORDER — LORAZEPAM 1 MG PO TABS: ORAL_TABLET | ORAL | 0 refills | Status: DC

## 2016-06-21 NOTE — Telephone Encounter (Signed)
Ok to call or fax in rx for #30, no refills.   Needs OV for additional refills.

## 2016-06-21 NOTE — Telephone Encounter (Signed)
Called in Rx as instr'd. Notified pt of need for f/up for more. Pt agreed.

## 2016-06-24 ENCOUNTER — Ambulatory Visit (HOSPITAL_COMMUNITY)
Admission: RE | Admit: 2016-06-24 | Discharge: 2016-06-24 | Disposition: A | Discharge status: Home / Self Care | Payer: BLUE CROSS/BLUE SHIELD | Source: Ambulatory Visit | Attending: Hematology | Admitting: Hematology

## 2016-06-24 ENCOUNTER — Encounter (HOSPITAL_COMMUNITY): Payer: Self-pay

## 2016-06-24 DIAGNOSIS — K8020 Calculus of gallbladder without cholecystitis without obstruction: Secondary | ICD-10-CM | POA: Insufficient documentation

## 2016-06-24 DIAGNOSIS — E119 Type 2 diabetes mellitus without complications: Secondary | ICD-10-CM | POA: Insufficient documentation

## 2016-06-24 DIAGNOSIS — C787 Secondary malignant neoplasm of liver and intrahepatic bile duct: Secondary | ICD-10-CM | POA: Diagnosis not present

## 2016-06-24 DIAGNOSIS — K219 Gastro-esophageal reflux disease without esophagitis: Secondary | ICD-10-CM | POA: Insufficient documentation

## 2016-06-24 DIAGNOSIS — Z9049 Acquired absence of other specified parts of digestive tract: Secondary | ICD-10-CM | POA: Diagnosis not present

## 2016-06-24 DIAGNOSIS — Z7984 Long term (current) use of oral hypoglycemic drugs: Secondary | ICD-10-CM | POA: Diagnosis not present

## 2016-06-24 DIAGNOSIS — Z85038 Personal history of other malignant neoplasm of large intestine: Secondary | ICD-10-CM | POA: Insufficient documentation

## 2016-06-24 DIAGNOSIS — I2510 Atherosclerotic heart disease of native coronary artery without angina pectoris: Secondary | ICD-10-CM | POA: Diagnosis not present

## 2016-06-24 DIAGNOSIS — C186 Malignant neoplasm of descending colon: Secondary | ICD-10-CM

## 2016-06-24 DIAGNOSIS — Z6841 Body Mass Index (BMI) 40.0 and over, adult: Secondary | ICD-10-CM | POA: Insufficient documentation

## 2016-06-24 DIAGNOSIS — Z7982 Long term (current) use of aspirin: Secondary | ICD-10-CM | POA: Diagnosis not present

## 2016-06-24 DIAGNOSIS — K8080 Other cholelithiasis without obstruction: Secondary | ICD-10-CM | POA: Insufficient documentation

## 2016-06-24 DIAGNOSIS — K769 Liver disease, unspecified: Secondary | ICD-10-CM | POA: Diagnosis present

## 2016-06-24 DIAGNOSIS — I10 Essential (primary) hypertension: Secondary | ICD-10-CM | POA: Insufficient documentation

## 2016-06-24 LAB — CBC
HCT: 44 % (ref 39.0–52.0)
Hemoglobin: 15.2 g/dL (ref 13.0–17.0)
MCH: 32.8 pg (ref 26.0–34.0)
MCHC: 34.5 g/dL (ref 30.0–36.0)
MCV: 94.8 fL (ref 78.0–100.0)
PLATELETS: 314 10*3/uL (ref 150–400)
RBC: 4.64 MIL/uL (ref 4.22–5.81)
RDW: 12.5 % (ref 11.5–15.5)
WBC: 10.8 10*3/uL — AB (ref 4.0–10.5)

## 2016-06-24 LAB — APTT: aPTT: 26 seconds (ref 24–36)

## 2016-06-24 LAB — PROTIME-INR
INR: 0.98
Prothrombin Time: 13 seconds (ref 11.4–15.2)

## 2016-06-24 LAB — GLUCOSE, CAPILLARY: GLUCOSE-CAPILLARY: 237 mg/dL — AB (ref 65–99)

## 2016-06-24 MED ORDER — MIDAZOLAM HCL 2 MG/2ML IJ SOLN
INTRAMUSCULAR | Status: AC
  Filled 2016-06-24: qty 6

## 2016-06-24 MED ORDER — SODIUM CHLORIDE 0.9 % IV SOLN
INTRAVENOUS | Status: DC
  Administered 2016-06-24: 12:00:00 via INTRAVENOUS

## 2016-06-24 MED ORDER — FLUMAZENIL 0.5 MG/5ML IV SOLN
INTRAVENOUS | Status: AC
  Filled 2016-06-24: qty 5

## 2016-06-24 MED ORDER — FENTANYL CITRATE (PF) 100 MCG/2ML IJ SOLN
INTRAMUSCULAR | Status: AC
  Filled 2016-06-24: qty 4

## 2016-06-24 MED ORDER — MIDAZOLAM HCL 2 MG/2ML IJ SOLN
INTRAMUSCULAR | Status: AC | PRN
  Administered 2016-06-24 (×4): 1 mg via INTRAVENOUS

## 2016-06-24 MED ORDER — NALOXONE HCL 0.4 MG/ML IJ SOLN
INTRAMUSCULAR | Status: AC
  Filled 2016-06-24: qty 1

## 2016-06-24 MED ORDER — FENTANYL CITRATE (PF) 100 MCG/2ML IJ SOLN
INTRAMUSCULAR | Status: AC | PRN
  Administered 2016-06-24 (×2): 50 ug via INTRAVENOUS

## 2016-06-24 NOTE — Discharge Instructions (Signed)
Liver Biopsy, Care After °Introduction °These instructions give you information on caring for yourself after your procedure. Your doctor may also give you more specific instructions. Call your doctor if you have any problems or questions after your procedure. °Follow these instructions at home: °· Rest at home for 1-2 days or as told by your doctor. °· Have someone stay with you for at least 24 hours. °· Do not do these things in the first 24 hours: °¨ Drive. °¨ Use machinery. °¨ Take care of other people. °¨ Sign legal documents. °¨ Take a bath or shower. °· There are many different ways to close and cover a cut (incision). For example, a cut can be closed with stitches, skin glue, or adhesive strips. Follow your doctor's instructions on: °¨ Taking care of your cut. °¨ Changing and removing your bandage (dressing). °¨ Removing whatever was used to close your cut. °· Do not drink alcohol in the first week. °· Do not lift more than 5 pounds or play contact sports for the first 2 weeks. °· Take medicines only as told by your doctor. For 1 week, do not take medicine that has aspirin in it or medicines like ibuprofen. °· Get your test results. °Contact a doctor if: °· A cut bleeds and leaves more than just a small spot of blood. °· A cut is red, puffs up (swells), or hurts more than before. °· Fluid or something else comes from a cut. °· A cut smells bad. °· You have a fever or chills. °Get help right away if: °· You have swelling, bloating, or pain in your belly (abdomen). °· You get dizzy or faint. °· You have a rash. °· You feel sick to your stomach (nauseous) or throw up (vomit). °· You have trouble breathing, feel short of breath, or feel faint. °· Your chest hurts. °· You have problems talking or seeing. °· You have trouble balancing or moving your arms or legs. °This information is not intended to replace advice given to you by your health care provider. Make sure you discuss any questions you have with your  health care provider. °Document Released: 04/09/2008 Document Revised: 12/07/2015 Document Reviewed: 08/27/2013 °© 2017 Elsevier °Moderate Conscious Sedation, Adult, Care After °These instructions provide you with information about caring for yourself after your procedure. Your health care provider may also give you more specific instructions. Your treatment has been planned according to current medical practices, but problems sometimes occur. Call your health care provider if you have any problems or questions after your procedure. °What can I expect after the procedure? °After your procedure, it is common: °· To feel sleepy for several hours. °· To feel clumsy and have poor balance for several hours. °· To have poor judgment for several hours. °· To vomit if you eat too soon. °Follow these instructions at home: °For at least 24 hours after the procedure:  °· Do not: °¨ Participate in activities where you could fall or become injured. °¨ Drive. °¨ Use heavy machinery. °¨ Drink alcohol. °¨ Take sleeping pills or medicines that cause drowsiness. °¨ Make important decisions or sign legal documents. °¨ Take care of children on your own. °· Rest. °Eating and drinking °· Follow the diet recommended by your health care provider. °· If you vomit: °¨ Drink water, juice, or soup when you can drink without vomiting. °¨ Make sure you have little or no nausea before eating solid foods. °General instructions °· Have a responsible adult stay with you until   you are awake and alert. °· Take over-the-counter and prescription medicines only as told by your health care provider. °· If you smoke, do not smoke without supervision. °· Keep all follow-up visits as told by your health care provider. This is important. °Contact a health care provider if: °· You keep feeling nauseous or you keep vomiting. °· You feel light-headed. °· You develop a rash. °· You have a fever. °Get help right away if: °· You have trouble breathing. °This  information is not intended to replace advice given to you by your health care provider. Make sure you discuss any questions you have with your health care provider. °Document Released: 04/21/2013 Document Revised: 12/04/2015 Document Reviewed: 10/21/2015 °Elsevier Interactive Patient Education © 2017 [employer]. ° °

## 2016-06-24 NOTE — Procedures (Signed)
Met colon ca to liver  S/p RT LIVER MASS [place] BX  NO COMP EBL <5CC PATH PENDING FULL REPORT IN PACS

## 2016-06-24 NOTE — Consult Note (Signed)
Chief Complaint: Patient was seen in consultation today for image guided liver lesion biopsy  Referring Physician(s): Feng,Yan  Supervising Physician: Daryll Brod  Patient Status: Chad [name]  History of Present Illness: Chad [name] is a 45 y.o. male with history of stage IIIA colon cancer diagnosed in January 2017. He is status post low anterior resection in April 2017 as well as  chemotherapy. MRI of the abdomen on 06/06/16 has revealed liver lesions worrisome for metastatic disease. He presents today for image guided liver lesion biopsy for further evaluation.  Past Medical History:  Diagnosis Date  . Cancer [facility])    colon cancer  . Coronary artery disease   . Diabetes mellitus without complication ([place]) 0000000   oral agents only (07/2015)  . ETOH abuse 2014  . Fatty liver 12/2014   on ultrasound. pt obese and abuses ETOH  . Gallstone 12/2014   GB stones and sludge on ultrasound and CT,  . GERD (gastroesophageal reflux disease)   . Hypertension   . Morbid obesity with BMI of 50.0-59.9, adult ([place]) 03/2013  . Rectal bleeding 05/2015   07/2015 colonoscopy with malignant appearing sigmoid mass. Additional rectal, descending, polyps  . Shoulder pain, bilateral    due to previous injury    Past Surgical History:  Procedure Laterality Date  . CARDIAC CATHETERIZATION N/A 04/06/2015   Procedure: Left Heart Cath and Coronary Angiography;  Surgeon: Belva Crome, MD;  Location: [facility];  Service: Cardiovascular;  Laterality: N/A;  . COLONOSCOPY N/A 07/25/2015   Procedure: COLONOSCOPY;  Surgeon: Gatha Mayer, MD;  Location: [facility];  Service: Gastroenterology;  Laterality: N/A;  . CYSTOSCOPY  10/18/2015   Procedure: CYSTOSCOPY FLEXIBLE URETHRAL DIALATION AND FOLEY PLACEMENT;  Surgeon: Alexis Frock, MD;  Location: [facility] ORS;  Service: Urology;;  . ESOPHAGOGASTRODUODENOSCOPY N/A 07/25/2015   Procedure: ESOPHAGOGASTRODUODENOSCOPY (EGD);  Surgeon: Gatha Mayer,  MD;  Location: [facility];  Service: Gastroenterology;  Laterality: N/A;    Allergies: Patient has no known allergies.  Medications: Prior to Admission medications   Medication Sig Start Date End Date Taking? Authorizing Provider  Ascorbic Acid (VITAMIN C) 1000 MG tablet Take 1,000 mg by mouth daily.    Historical Provider, MD  aspirin EC 81 MG tablet Take 1 tablet (81 mg total) by mouth daily. DO NOT TAKE AGAIN UNTIL 08/09/15 07/25/15   Gatha Mayer, MD  carvedilol (COREG) 6.25 MG tablet Take 1 tablet (6.25 mg total) by mouth 2 (two) times daily with a meal. Patient taking differently: Take 6.25 mg by mouth every morning.  07/13/15   Shawnee Knapp, MD  diazepam (VALIUM) 2 MG tablet Take 1 tablet (2 mg total) by mouth as directed. Take on 30 minutes before MRI and repeat if needed Patient not taking: Reported on 06/11/2016 05/27/16   Truitt Merle, MD  gabapentin (NEURONTIN) 300 MG capsule Take 1 capsule (300 mg total) by mouth at bedtime. 04/03/16   Shawnee Knapp, MD  glipiZIDE (GLUCOTROL XL) 5 MG 24 hr tablet Take 1 tablet (5 mg total) by mouth daily with breakfast. 05/27/16   Shawnee Knapp, MD  lisinopril-hydrochlorothiazide (PRINZIDE,ZESTORETIC) 20-25 MG tablet TAKE 1 TABLET BY MOUTH DAILY 01/10/16   Shawnee Knapp, MD  LORazepam (ATIVAN) 1 MG tablet TAKE 1 TABLET BY MOUTH AT BEDTIME AS NEEDED FOR ANXIETY 06/21/16   Shawnee Knapp, MD  metFORMIN (GLUCOPHAGE-XR) 500 MG 24 hr tablet Take 4 tablets once a day WITH BREAKFAST 07/13/15  Shawnee Knapp, MD  omeprazole (PRILOSEC) 40 MG capsule TAKE 1 CAPSULE(40 MG) BY MOUTH DAILY 05/27/16   Shawnee Knapp, MD     Family History  Problem Relation Age of Onset  . Diabetes Father   . Pancreatic cancer Father 6    former smoker, but had not smoked in 68 years  . Cancer Maternal Uncle     d. 54s; unknown type of cancer -metastatic throughout abdomen   . Other Brother     oldest brother has had a normal colonoscopy  . Heart attack Maternal Grandfather     d. 57-58; heavy  smoker and drinker  . Heart attack Paternal Grandmother     d. early 76s  . Heart attack Paternal Grandfather 53  . Heart failure Maternal Uncle   . Skin cancer Maternal Uncle 42    NOS type; was out in the sun a lot  . Brain cancer Maternal Uncle     NOS type; mother's maternal half-brother dx. 52-68  . Cirrhosis Paternal Uncle   . Cancer Paternal Uncle     (x2-3) uncles with NOS cancers; d. 14s    Social History   Social History  . Marital status: Legally Separated    Spouse name: N/A  . Number of children: 1  . Years of education: N/A   Occupational History  . Manager    Social History Main Topics  . Smoking status: Never Smoker  . Smokeless tobacco: Current User     Comment: occasionally dips tobacco - has used for approx 20 yrs (12/21/15)  . Alcohol use Yes     Comment: heavy drinking for 10 years, 2-3  drinks of liqour a night, has been cutting back.    . Drug use: No  . Sexual activity: Not Asked   Other Topics Concern  . 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 He does have some dyspnea with exertion as well as intermittent abdominal pain  Vital Signs: BP (!) 166/85 (BP Location: Left Arm)   Pulse 91   Temp 98.6 F (37 C) (Oral)   Resp 20   SpO2 99%   Physical Exam  awake, alert. Chest clear to auscultation bilaterally. Heart with regular rate and rhythm. Abdomen obese, soft, positive bowel sounds, nontender. Lower extremities with no edema. Mallampati Score:     Imaging: Mr Abdomen W Wo Contrast  Result Date: 06/06/2016 CLINICAL DATA:  Colorectal carcinoma. Indeterminate lesions within liver on CT. EXAM: MRI ABDOMEN WITHOUT AND WITH CONTRAST TECHNIQUE: Multiplanar multisequence MR imaging of the abdomen was performed both before and  after the administration of intravenous contrast. CONTRAST:  18mL MULTIHANCE GADOBENATE DIMEGLUMINE 529 MG/ML IV SOLN COMPARISON:  CT 05/01/2016 FINDINGS: Lower chest:  Lung bases are clear. Hepatobiliary: Several round lesions in the RIGHT hepatic lobe are hyperintense on T2 weighted imaging. For example 4.2 cm lesion on image 20, series 5 and 4.1 cm lesion on same image. 2.4 cm lesion on image 22 within the RIGHT hepatic lobe. Lesion in the superior LEFT hepatic lobe measures 2.3 cm (image 11). these lesions demonstrate peripheral enhancement on the post-contrast series and number 6 in total. Smaller lesion in segment 4 identified on image 84, series 11. No biliary  duct dilatation. Multiple large gallstones colectomy gall bladder. Common bile duct normal caliber. Pancreas: Normal pancreatic parenchymal intensity. No ductal dilatation or inflammation. Spleen: Normal spleen. Adrenals/urinary tract: Adrenal glands and kidneys are normal. Stomach/Bowel: Stomach and limited of the small bowel is unremarkable Vascular/Lymphatic: Abdominal aortic normal caliber. No retroperitoneal periportal lymphadenopathy. Musculoskeletal: No aggressive osseous lesion IMPRESSION: 1. 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 2. No additional metastatic disease in the upper abdomen. 3. Cholelithiasis. Electronically Signed   By: Suzy Bouchard M.D.   On: 06/06/2016 10:45    Labs:  CBC:  Recent Labs  12/21/15 0953 01/11/16 0949 02/08/16 0952 05/01/16 1124  WBC 8.6 8.4 8.5 8.0  HGB 14.5 14.8 14.9 15.0  HCT 42.8 44.1 42.9 44.6  PLT 268 213 185 221    COAGS: No results for input(s): INR, APTT in the last 8760 hours.  BMP:  Recent Labs  07/13/15 0846  10/13/15 1425  10/18/15 1650 10/19/15 0418  01/11/16 0949 02/08/16 0958 04/03/16 1552 05/01/16 1124  NA 136  --  136  < >  --  136  < > 139 137 137 140  K 4.4  --  4.2  < >  --  4.5  < > 3.6 4.4 3.6 4.3  CL 97*  --  98*  --   --  102   --   --   --  99  --   CO2 27  --  26  --   --  27  < > 21* 22 24 27   GLUCOSE 193*  --  236*  --   --  214*  < > 242* 271* 235* 210*  BUN 10  --  13  --   --  9  < > 9.8 11.9 9 11.8  CALCIUM 9.2  --  9.5  --   --  8.1*  < > 9.1 9.6 8.9 9.3  CREATININE 0.81  < > 0.77  --  0.93 0.86  < > 0.8 0.8 0.68 0.8  GFRNONAA  --   --  >60  --  >60 >60  --   --   --   --   --   GFRAA  --   --  >60  --  >60 >60  --   --   --   --   --   < > = values in this interval not displayed.  LIVER FUNCTION TESTS:  Recent Labs  01/11/16 0949 02/08/16 0958 04/03/16 1552 05/01/16 1124  BILITOT 0.58 0.72 1.0 0.80  AST 27 25 19 31   ALT 32 31 23 36  ALKPHOS 57 62 55 57  PROT 7.1 7.6 7.1 7.5  ALBUMIN 3.3* 3.6 3.8 3.6    TUMOR MARKERS:  Recent Labs  07/25/15 1204  CEA 0.7    Assessment and Plan: 45 y.o. male with history of stage IIIA colon cancer diagnosed in January 2017. He is status post low anterior resection in April 2017 as well as chemotherapy. MRI of the abdomen on 06/06/16 has revealed liver lesions worrisome for metastatic disease. He presents today for image guided liver lesion biopsy for further evaluation.Risks and benefits discussed with the patient/brother including, but not limited to bleeding, infection, damage to adjacent structures or low yield requiring additional tests. All of the patient's questions were answered, patient is agreeable to proceed. Consent signed and in chart. LABS PENDING.      Thank you for this interesting consult.  I greatly enjoyed meeting  Trinna Post and look forward to participating in their care.  A copy of this report was sent to the requesting provider on this date.  Electronically Signed: D. Rowe Robert 06/24/2016, 11:14 AM   I spent a total of 20 minutes in face to face in clinical consultation, greater than 50% of which was counseling/coordinating care for image guided liver lesion biopsy

## 2016-06-25 ENCOUNTER — Other Ambulatory Visit: Payer: Self-pay | Admitting: Gastroenterology

## 2016-06-25 ENCOUNTER — Other Ambulatory Visit: Payer: Self-pay | Admitting: Hematology

## 2016-06-25 ENCOUNTER — Telehealth: Payer: Self-pay | Admitting: Hematology

## 2016-06-25 ENCOUNTER — Other Ambulatory Visit: Payer: Self-pay | Admitting: Family Medicine

## 2016-06-25 DIAGNOSIS — R1084 Generalized abdominal pain: Secondary | ICD-10-CM

## 2016-06-25 DIAGNOSIS — K625 Hemorrhage of anus and rectum: Secondary | ICD-10-CM

## 2016-06-25 DIAGNOSIS — K219 Gastro-esophageal reflux disease without esophagitis: Secondary | ICD-10-CM

## 2016-06-25 MED ORDER — CAPECITABINE 500 MG PO TABS: 825.0000 mg/m2 | ORAL_TABLET | Freq: Two times a day (BID) | ORAL | 2 refills | Status: DC

## 2016-06-25 NOTE — Telephone Encounter (Signed)
Spoke with patient re lab/fu/tx 12/14 @ 8:30 am. Per inf supervisor ok to add tx 12/14.

## 2016-06-25 NOTE — Progress Notes (Signed)
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   Staging form: Colon and Rectum, AJCC 7th Edition     Pathologic stage from 10/18/2015: Stage IIIA (T2, N1a, cM0) - Signed by Truitt Merle, MD on 11/15/2015       Cancer of left colon ([place])   05/11/2015 Imaging    CT ABD/PELVIS: Gallstones w/sludge;liver prominent without focal liver lesion, mild thickening of bladder wall; negative for cancer      05/18/2015 Imaging    [place] ABD: Negative      07/25/2015 Initial Diagnosis    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 8 mm descending polyp and 5 mm rectosigmoid polyp;18-29 mm proximal rectal polyp      07/25/2015 Tumor Marker    CEA=0.7      07/26/2015 Pathology Results    Sigmoid mass: invasive colorectal adenocarcinoma      10/22/2015 Pathologic Stage    T2, N1, MO    #29 nodes examined w/ 1 postive node; moderately differentiated; Negative for Lymph-vascular and Peri-neural invasion; Microscopic extension into muscularis propria      10/26/2015 Pathology Results    MSI Stable      11/30/2015 - 02/08/2016 Chemotherapy    Adjuvant chemotherapy with Capecitabine 2500 mg twice daily, on day 1-14, oxaliplatin 130 mg/m, every 21 days      05/01/2016 Imaging    Surveillance CT CAP scan showed new more focal low-density lesions in the liver, possible metastasis. No other new lesions.      06/05/2016 Imaging    MR ABDOMEN W WO CONTRAST IMPRESSION: 1. 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 2. No additional metastatic disease in the upper abdomen. 3. Cholelithiasis.      06/24/2016 Pathology Results    US Biopsy Diagnosis Liver, needle/core biopsy, right lobe - METASTATIC ADENOCARCINOMA, CONSISTENT WITH COLORECTAL PRIMARY.       HISTORY OF PRESENTING ILLNESS:  Chad [name] 45 y.o. male is here because of recently diagnosed colon cancer.  He is accompanied by his friend to the clinic today.  He presented with abdominal pain since Oct 2016,  Was seen in the emergency room on 05/11/2015,  CT abdomen and pelvis showed  Mild thickening of the urinary bladder wall, gallstones, otherwise negative. He was seen by her primary care physician Dr. Brigitte Pulse afterwards, who referred him to GI Dr. Carlean Purl.  His father passed away from [place] cancer around that time,  So his appointment was postponed, and he finally had colonoscopy in early January 2017, which showed multiple polyps, and  A ulcerated mass in distal sigmoid colon.  He was subsequently referred to colorectal surgeon Dr. Johney Maine, and  Eventually had left colectomy  On 11/01/2015.  He tolerated surgery well, and recovered well. He has good appetite good, BM is  normal most of time, occasional diarrhea, likely second to food, mild low abdomen pain, which has improves, he lost about 30 lbs before surgery and 10lbs after surgery.   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  He is divorced, he has a 71 year old daughter who lives with her mother in [place]. He has sisters and brothers, and her mother who live close by.   CURRENT THERAPY: pending chemo CAPOX   INTERIM HISTORY: Chad [name] returns for  follow-up. The patient had a biopsy of the right lobe of the liver on 06/24/16 revealing metastatic adenocarcinoma consistent with a colorectal primary. Denies pain and reports he is fine. Says he does not want to start chemo until next year due to work. The patient states he has not spoken much about his diagnosis with his family (mother and brother). He will tell them after the holidays.  MEDICAL HISTORY:  Past Medical History:  Diagnosis Date  . Cancer [facility])    colon cancer  . Coronary artery disease   . Diabetes mellitus without complication ([place]) 11/1882   oral agents only (07/2015)  . ETOH abuse 2014  . Fatty liver 12/2014   on ultrasound. pt obese and abuses ETOH  . Gallstone 12/2014   GB stones and sludge on ultrasound and CT,  . GERD (gastroesophageal reflux disease)   . Hypertension   . Morbid obesity with BMI of 50.0-59.9, adult ([place]) 03/2013  . Rectal bleeding 05/2015   07/2015 colonoscopy with malignant appearing sigmoid mass. Additional rectal, descending, polyps  . Shoulder pain, bilateral    due to previous injury    SURGICAL HISTORY: Past Surgical History:  Procedure Laterality Date  . CARDIAC CATHETERIZATION N/A 04/06/2015   Procedure: Left Heart Cath and Coronary Angiography;  Surgeon: Belva Crome, MD;  Location: [place] CV LAB;  Service: Cardiovascular;  Laterality: N/A;  . COLONOSCOPY N/A 07/25/2015   Procedure: COLONOSCOPY;  Surgeon: Gatha Mayer, MD;  Location: [facility] ENDOSCOPY;  Service: Gastroenterology;  Laterality: N/A;  . CYSTOSCOPY  10/18/2015   Procedure: CYSTOSCOPY FLEXIBLE URETHRAL DIALATION AND FOLEY PLACEMENT;  Surgeon: Alexis Frock, MD;  Location: [facility] ORS;  Service: Urology;;  . ESOPHAGOGASTRODUODENOSCOPY N/A 07/25/2015   Procedure: ESOPHAGOGASTRODUODENOSCOPY (EGD);  Surgeon: Gatha Mayer, MD;  Location: [facility];  Service: Gastroenterology;  Laterality: N/A;    SOCIAL HISTORY: Social History   Social History  . Marital status: Legally  Separated    Spouse name: N/A  . Number of children: 1  . Years of education: N/A   Occupational History  . Manager    Social History Main Topics  . Smoking status: Never Smoker  . Smokeless tobacco: Current User     Comment: occasionally dips tobacco - has used for approx 20 yrs (12/21/15)  . Alcohol use Yes     Comment: heavy drinking for 10 years, 2-3  drinks of liqour a night, has been cutting back.    . Drug use: No  . Sexual activity: Not on file   Other Topics Concern  . Not on file   Social History Narrative   Divorced, has 24 yo daughter lives in [place]   Lives alone   Manages chain of car wash dealers       FAMILY HISTORY: Family History  Problem Relation Age of Onset  . Diabetes Father   . Pancreatic cancer Father 40    former smoker, but had not smoked in 66  years  . Cancer Maternal Uncle     d. 59s; unknown type of cancer -metastatic throughout abdomen   . Other Brother     oldest brother has had a normal colonoscopy  . Heart attack Maternal Grandfather     d. 57-58; heavy smoker and drinker  . Heart attack Paternal Grandmother     d. early 32s  . Heart attack Paternal Grandfather 27  . Heart failure Maternal Uncle   . Skin cancer Maternal Uncle 42    NOS type; was out in the sun a lot  . Brain cancer Maternal Uncle     NOS type; mother's maternal half-brother dx. 7-68  . Cirrhosis Paternal Uncle   . Cancer Paternal Uncle     (x2-3) uncles with NOS cancers; d. 7s    ALLERGIES:  has No Known Allergies.  MEDICATIONS:  Current Outpatient Prescriptions  Medication Sig Dispense Refill  . Ascorbic Acid (VITAMIN C) 1000 MG tablet Take 1,000 mg by mouth daily.    Marland Kitchen aspirin EC 81 MG tablet Take 1 tablet (81 mg total) by mouth daily. DO NOT TAKE AGAIN UNTIL 08/09/15    . carvedilol (COREG) 6.25 MG tablet Take 1 tablet (6.25 mg total) by mouth 2 (two) times daily with a meal. (Patient taking differently: Take 6.25 mg by mouth every morning. ) 180 tablet 1  .  gabapentin (NEURONTIN) 300 MG capsule Take 1 capsule (300 mg total) by mouth at bedtime. 90 capsule 1  . glipiZIDE (GLUCOTROL XL) 5 MG 24 hr tablet Take 1 tablet (5 mg total) by mouth daily with breakfast. 90 tablet 0  . lisinopril-hydrochlorothiazide (PRINZIDE,ZESTORETIC) 20-25 MG tablet TAKE 1 TABLET BY MOUTH DAILY 90 tablet 0  . LORazepam (ATIVAN) 1 MG tablet TAKE 1 TABLET BY MOUTH AT BEDTIME AS NEEDED FOR ANXIETY 30 tablet 0  . metFORMIN (GLUCOPHAGE-XR) 500 MG 24 hr tablet Take 4 tablets once a day WITH BREAKFAST 360 tablet 3  . omeprazole (PRILOSEC) 40 MG capsule TAKE 1 CAPSULE(40 MG) BY MOUTH DAILY 90 capsule 0  . capecitabine (XELODA) 500 MG tablet Take 5 tablets (2,500 mg total) by mouth 2 (two) times daily after a meal. (Patient not taking: Reported on 06/27/2016) 140 tablet 2  . sucralfate (CARAFATE) 1 g tablet   2   No current facility-administered medications for this visit.     REVIEW OF SYSTEMS:   Constitutional: Denies fevers, chills or abnormal night sweats Eyes: Denies blurriness of vision, double vision or watery eyes Ears, nose, mouth, throat, and face: Denies mucositis or sore throat Respiratory: Denies cough, dyspnea or wheezes Cardiovascular: Denies palpitation, chest discomfort or lower extremity swelling Gastrointestinal:  Denies nausea, heartburn or change in bowel habits Skin: Denies abnormal skin rashes Lymphatics: Denies new lymphadenopathy or easy bruising Neurological:Denies numbness, tingling or new weaknesses Behavioral/Psych: Mood is stable, no new changes  All other systems were reviewed with the patient and are negative.  PHYSICAL EXAMINATION: ECOG PERFORMANCE STATUS: 0  Vitals:   06/27/16 0859  BP: (!) 167/79  Pulse: 85  Resp: 18  Temp: 97.8 F (36.6 C)   Filed Weights   06/27/16 0859  Weight: (!) 404 lb 1.6 oz (183.3 kg)    GENERAL:alert, (+) Tearful during the encounter. Morbidly obese. SKIN: skin color, texture, turgor are normal, no  rashes or significant lesions EYES: normal, conjunctiva are pink and non-injected, sclera clear OROPHARYNX:no exudate, no erythema and lips, buccal mucosa, and tongue normal  NECK: supple, thyroid normal size, non-tender, without  nodularity LYMPH:  no palpable lymphadenopathy in the cervical, axillary or inguinal LUNGS: clear to auscultation and percussion with normal breathing effort HEART: regular rate & rhythm and no murmurs and no lower extremity edema ABDOMEN:abdomen soft,  Surgical incision and laparoscopic scars are well-healed. non-tender and normal bowel sounds Musculoskeletal:no cyanosis of digits and no clubbing  PSYCH: alert & oriented x 3 with fluent speech NEURO: no focal motor/sensory deficits  LABORATORY DATA:  I have reviewed the data as listed CBC Latest Ref Rng & Units 06/27/2016 06/24/2016 05/01/2016  WBC 4.0 - 10.3 10e3/uL 8.9 10.8(H) 8.0  Hemoglobin 13.0 - 17.1 g/dL 15.3 15.2 15.0  Hematocrit 38.4 - 49.9 % 45.1 44.0 44.6  Platelets 140 - 400 10e3/uL 286 314 221    CMP Latest Ref Rng & Units 06/27/2016 05/01/2016 04/03/2016  Glucose 70 - 140 mg/dl 299(H) 210(H) 235(H)  BUN 7.0 - 26.0 mg/dL 11.0 11.8 9  Creatinine 0.7 - 1.3 mg/dL 0.9 0.8 0.68  Sodium 136 - 145 mEq/L 139 140 137  Potassium 3.5 - 5.1 mEq/L 4.4 4.3 3.6  Chloride 98 - 110 mmol/L - - 99  CO2 22 - 29 mEq/L '26 27 24  ' Calcium 8.4 - 10.4 mg/dL 9.4 9.3 8.9  Total Protein 6.4 - 8.3 g/dL 7.6 7.5 7.1  Total Bilirubin 0.20 - 1.20 mg/dL 0.55 0.80 1.0  Alkaline Phos 40 - 150 U/L 71 57 55  AST 5 - 34 U/L '24 31 19  ' ALT 0 - 55 U/L 29 36 23   CEA:  07/25/2015: 0.7 11/30/2015: 4.5 05/01/2016: 57.89 (64.7)    PATHOLOGY REPORT: US Biopsy 06/24/2016 Diagnosis Liver, needle/core biopsy, right lobe - METASTATIC ADENOCARCINOMA, CONSISTENT WITH COLORECTAL PRIMARY. - SEE COMMENT. Microscopic Comment Dr. Burr Medico was paged on 06/25/16. Per request, a block will be sent for Foundation 1 testing and the results  reported separately. (JBK:gt, 06/25/16)   Diagnosis 10/18/2015 1. Colon, segmental resection for tumor, rectosigmoid INFILTRATIVE MODERATELY DIFFERENTIATED ADENOCARCINOMA OF THE COLON (3.8 CM) THE TUMOR INVADES MUSCULARIS PROPRIA MARGINS OF RESECTION ARE NEGATIVE FOR TUMOR METASTATIC COLONIC ADENOCARCINOMA IN ONE OF TWENTY-NINE LYMPH NODES (1/29) 2. Colon, resection margin (donut), final distal margin BENIGN COLONIC TISSUE, NEGATIVE FOR CARCINOMA Microscopic Comment 1. COLON AND RECTUM (INCLUDING TRANS-ANAL RESECTION): Specimen: Rectosigmoid colon Procedure: Segmental resection Tumor site: Rectal colon Specimen integrity: Intact Macroscopic intactness of mesorectum: Not applicable: NA Complete: NA Near complete: x Incomplete: NA Cannot be determined (specify): NA Macroscopic tumor perforation: Muscularis propria Invasive tumor: Maximum size: 3.8 cm Histologic type(s): Adenocarcinoma Histologic grade and differentiation: G2 G1: well differentiated/low grade G2: moderately differentiated/low grade G3: poorly differentiated/high grade G4: undifferentiated/high grade Type of polyp in which invasive carcinoma arose: Tubular adenoma Microscopic extension of invasive tumor:Muscularis propria Lymph-Vascular invasion: Negative Peri-neural invasion: Negative Tumor deposit(s) (discontinuous extramural extension): Negative Resection margins: Proximal margin: Negative Distal margin: Negative Circumferential (radial) (posterior ascending, posterior descending; lateral and posterior mid-rectum; and entire lower 1/3 rectum):Negative Mesenteric margin (sigmoid and transverse): Negative Distance closest margin (if all above margins negative): 4.8 cm Trans-anal resection margins only: Deep margin: NA Mucosal Margin: NA Distance closest mucosal margin (if negative): NA Treatment effect (neo-adjuvant therapy): NA Additional polyp(s): Hyperplastic poly Non-neoplastic findings:  Unremarkable Lymph nodes: number examined 29; number positive: 1 Pathologic Staging: T2, N1, M0   Mismatch Repair (MMR) Protein Immunohistochemistry (IHC) IHC Expression Result: MLH1: Preserved nuclear expression (greater 50% tumor expression) MSH2: Preserved nuclear expression (greater 50% tumor expression) MSH6: Preserved nuclear expression (greater 50% tumor expression) PMS2: Preserved  nuclear expression (greater 50% tumor expression) * Internal control demonstrates intact nuclear expression Interpretation: NORMAL     Diagnosis 07/25/2015 1. Colon, polyp(s), descending - TUBULAR ADENOMA. NO HIGH GRADE DYSPLASIA OR MALIGNANCY IDENTIFIED. 2. Colon, biopsy, distal sigmoid mass - INVASIVE COLORECTAL ADENOCARCINOMA. 3. Colon, polyp(s), small recto sigmoid - HYPERPLASTIC POLYP. NO ADENOMATOUS CHANGE OR MALIGNANCY. 4. Rectum, polyp(s) - TUBULOVILLOUS ADENOMA. NO HIGH GRADE DYSPLASIA OR MALIGNANCY IDENTIFIED.   RADIOGRAPHIC STUDIES: I have personally reviewed the radiological images as listed and agreed with the findings in the report.  CT chest, abdomen and pelvis w contrast 05/01/2016  IMPRESSION: 1. Stable chest CT.  No evidence of thoracic metastatic disease. 2. Progressive heterogeneity of the liver could be due to heterogeneous steatosis. However, there are new more focal low-density lesions which are worrisome for possible metastatic disease given the patient's history. Further evaluation with hepatic MRI recommended. 3. Resolving postsurgical retroperitoneal fluid collections status post partial colon resection and anastomosis.  Abdominal MRI w wo contrast 06/05/2016 IMPRESSION: 1. 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 2. No additional metastatic disease in the upper abdomen. 3. Cholelithiasis.   COLONOSCOPY 07/25/2015 ENDOSCOPIC IMPRESSION: 1) 1/4 circumference firm ulcerated mass in distal sigmmoid - approx 22 cm from verge. Looks  like cancer. Biopsied. 2) 8 mm descending polyp and 5 mm rectosigmoid polyp removed cold snare, completely recovered and sent to path. 3) 18-20 mm pedunculated proximal rectal polyp removed hot snare and completely recovered for pathology. 4) Otherwise normal colonoscopy RECOMMENDATIONS: 1. Hold Aspirin and all other NSAIDS for 2 weeks. 2. Will call pathology results and plans though anticipate surgical referral after pathology in. CEA will be drawn at hospital today. He has had recent CT's of chest, and abd/pelvis - will see if he needs new ones - he may.  EGD 07/25/2015 ENDOSCOPIC IMPRESSION: Normal appearing esophagus and GE junction, the stomach was well visualized and normal in appearance, normal appearing duodenum  ASSESSMENT & PLAN:  45 y.o.  Caucasian male, with past medical history of heavy alcohol drinking, hypertension, diabetes, coronary artery disease, presented with abdominal pain.  1.  Cancer of left colon, Distal sigmoid, invasive adenocarcinoma, G2, pT2N1aM0, stage IIIA, MSI-stable, liver metastasis in 04/2016 -I initially reviewed his CT scan findings, and the surgical pathology findings include details with patient. -he has locally advanced sigmoid colon cancer, with one out of 29 lymph nodes positive. -We previously discussed the risk of cancer recurrence after's complete surgical resection. Giving his stage IIIa disease, he is at moderate to high risk for recurrence. -He has completed 3 months of adjuvant chemotherapy CAPOX, tolerated well. -I previously discussed his surveillance CT scan, which unfortunately showed multiple liver lesions, suspicious for metastasis.  -His CEA level has significantly increased lately, which is concerning for cancer recurrence.  -I previously discussed his abdominal MRI findings, and personally reviewed the image with patient, unfortunately MRI confirmed at least a 6 metastatic lesions in the liver, largest 4.2 cm. Giving the multiple liver  metastasis, this is likely incurable metastatic cancer. -He underwent ultrasound-guided liver mass biopsy to confirm metastasis, which revealed metastatic adenocarcinoma consistent with a colorectal primary. Foundation One test is pending. I discussed with pt  -We discussed chemotherapy options. Due to his work schedule and previous good tolerance to CAPEOX, he prefered to do CAPOX. -Based on the Foundation one results, I'll add on Avastin or EGFR inhibitor to his chemotherapy  -Pt has limited social support, he was quite tearful during the visit. He has not discussed  with his mother and brother about his cancer recurrence, he planned to do so after the holiday. They are out of town right now, patient prefers to start chemotherapy when they return after the holiday. -I'll schedule his first cycle chemotherapy CAPOX on 07/18/2016  2. Genetics -per NCCN guideline, due to his young age and family history of pancreatic cancer, he is qualified for genetic testing to ruled out inheritable genetic syndromes. -His genetic testing was negative. APC VUS called [id number] found on the colorectal cancer panel.  The Colorectal Cancer Panel offered by [facility] includes sequencing and/or duplication/deletion testing of the following 19 genes: APC, ATM, AXIN2, BMPR1A, CDH1, CHEK2, EPCAM, MLH1, MSH2, MSH6, MUTYH, PMS2, POLD1, POLE, PTEN, SCG5/GREM1, SMAD4, STK11, and TP53.  3. HTN, DM, CAD, morbid obesity -he will continue follow-up with his primary care physician -We will monitor his blood pressure, glucose closely during the chemotherapy, and may need to adjust his the medication if needed. -I strongly encouraged him to see Dr. Brigitte Pulse to adjust his diabetic medication due to his uncontrolled hyperglycemia and hypertension  4. History of alcohol abuse -He has stopped alcohol completely since he started chemotherapy.   5. 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  -Near resolved now  6. Coping  and social issues  -The patient is having a hard time with his diagnosis. He has not spoken with his family about his diagnosis. He is spending the holidays by himself and will tell his family after the holidays. -I offered social work to help whenever he is in need of it. He declined for now   Plan -biopsy result discussed  -Lab, follow-up and first dose Xeloda and oxaliplatin on 07/18/16. -I sent a prescription of Xeloda to his specialty pharmacy, he knows to call us if he does not receive the delivery by next week.  All questions were answered. The patient knows to call the clinic with any problems, questions or concerns. I spent 20 minutes counseling the patient face to face. The total time spent in the appointment was 25 minutes and more than 50% was on counseling.     Truitt Merle, MD 06/27/2016   This document serves as a record of services personally performed by Truitt Merle, MD. It was created on her behalf by Darcus Austin, a trained medical scribe. The creation of this record is based on the scribe's personal observations and the provider's statements to them. This document has been checked and approved by the attending provider.

## 2016-06-26 ENCOUNTER — Other Ambulatory Visit: Payer: Self-pay | Admitting: *Deleted

## 2016-06-26 MED ORDER — CAPECITABINE 500 MG PO TABS: 825.0000 mg/m2 | ORAL_TABLET | Freq: Two times a day (BID) | ORAL | 2 refills | Status: DC

## 2016-06-27 ENCOUNTER — Ambulatory Visit: Payer: BLUE CROSS/BLUE SHIELD

## 2016-06-27 ENCOUNTER — Other Ambulatory Visit (HOSPITAL_BASED_OUTPATIENT_CLINIC_OR_DEPARTMENT_OTHER): Payer: BLUE CROSS/BLUE SHIELD

## 2016-06-27 ENCOUNTER — Telehealth: Payer: Self-pay | Admitting: Hematology

## 2016-06-27 ENCOUNTER — Encounter: Payer: Self-pay | Admitting: Hematology

## 2016-06-27 ENCOUNTER — Telehealth: Payer: Self-pay | Admitting: *Deleted

## 2016-06-27 ENCOUNTER — Ambulatory Visit (HOSPITAL_BASED_OUTPATIENT_CLINIC_OR_DEPARTMENT_OTHER): Payer: BLUE CROSS/BLUE SHIELD | Admitting: Hematology

## 2016-06-27 VITALS — BP 167/79 | HR 85 | Temp 97.8°F | Resp 18 | Ht 75.0 in | Wt >= 6400 oz

## 2016-06-27 DIAGNOSIS — C19 Malignant neoplasm of rectosigmoid junction: Secondary | ICD-10-CM

## 2016-06-27 DIAGNOSIS — C787 Secondary malignant neoplasm of liver and intrahepatic bile duct: Secondary | ICD-10-CM | POA: Diagnosis not present

## 2016-06-27 DIAGNOSIS — E1165 Type 2 diabetes mellitus with hyperglycemia: Secondary | ICD-10-CM

## 2016-06-27 DIAGNOSIS — G620 Drug-induced polyneuropathy: Secondary | ICD-10-CM

## 2016-06-27 DIAGNOSIS — C186 Malignant neoplasm of descending colon: Secondary | ICD-10-CM

## 2016-06-27 DIAGNOSIS — I10 Essential (primary) hypertension: Secondary | ICD-10-CM

## 2016-06-27 DIAGNOSIS — F1010 Alcohol abuse, uncomplicated: Secondary | ICD-10-CM

## 2016-06-27 DIAGNOSIS — I2510 Atherosclerotic heart disease of native coronary artery without angina pectoris: Secondary | ICD-10-CM

## 2016-06-27 DIAGNOSIS — E119 Type 2 diabetes mellitus without complications: Secondary | ICD-10-CM

## 2016-06-27 LAB — CBC WITH DIFFERENTIAL/PLATELET
BASO%: 0.6 % (ref 0.0–2.0)
BASOS ABS: 0.1 10*3/uL (ref 0.0–0.1)
EOS ABS: 0.3 10*3/uL (ref 0.0–0.5)
EOS%: 3.7 % (ref 0.0–7.0)
HEMATOCRIT: 45.1 % (ref 38.4–49.9)
HEMOGLOBIN: 15.3 g/dL (ref 13.0–17.1)
LYMPH#: 2.4 10*3/uL (ref 0.9–3.3)
LYMPH%: 27.4 % (ref 14.0–49.0)
MCH: 32.2 pg (ref 27.2–33.4)
MCHC: 33.9 g/dL (ref 32.0–36.0)
MCV: 95.1 fL (ref 79.3–98.0)
MONO#: 0.9 10*3/uL (ref 0.1–0.9)
MONO%: 9.8 % (ref 0.0–14.0)
NEUT#: 5.2 10*3/uL (ref 1.5–6.5)
NEUT%: 58.5 % (ref 39.0–75.0)
PLATELETS: 286 10*3/uL (ref 140–400)
RBC: 4.74 10*6/uL (ref 4.20–5.82)
RDW: 12.7 % (ref 11.0–14.6)
WBC: 8.9 10*3/uL (ref 4.0–10.3)

## 2016-06-27 LAB — COMPREHENSIVE METABOLIC PANEL
ALBUMIN: 3.4 g/dL — AB (ref 3.5–5.0)
ALK PHOS: 71 U/L (ref 40–150)
ALT: 29 U/L (ref 0–55)
ANION GAP: 10 meq/L (ref 3–11)
AST: 24 U/L (ref 5–34)
BUN: 11 mg/dL (ref 7.0–26.0)
CALCIUM: 9.4 mg/dL (ref 8.4–10.4)
CO2: 26 mEq/L (ref 22–29)
Chloride: 103 mEq/L (ref 98–109)
Creatinine: 0.9 mg/dL (ref 0.7–1.3)
Glucose: 299 mg/dl — ABNORMAL HIGH (ref 70–140)
POTASSIUM: 4.4 meq/L (ref 3.5–5.1)
Sodium: 139 mEq/L (ref 136–145)
Total Bilirubin: 0.55 mg/dL (ref 0.20–1.20)
Total Protein: 7.6 g/dL (ref 6.4–8.3)

## 2016-06-27 NOTE — Telephone Encounter (Signed)
Per LOS I have scheduled appts and notified the scheduler 

## 2016-06-27 NOTE — Telephone Encounter (Signed)
Message sent to chemo scheduler to be added per 06/27/16 los. Appointments scheduled per 06/27/16 los. A copy of the appointment schedule and AVS report, was given to th e patient per 06/27/16 los. ° °

## 2016-07-17 ENCOUNTER — Other Ambulatory Visit: Payer: Self-pay | Admitting: Hematology

## 2016-07-18 ENCOUNTER — Ambulatory Visit (HOSPITAL_BASED_OUTPATIENT_CLINIC_OR_DEPARTMENT_OTHER): Payer: BLUE CROSS/BLUE SHIELD | Admitting: Hematology

## 2016-07-18 ENCOUNTER — Ambulatory Visit (HOSPITAL_BASED_OUTPATIENT_CLINIC_OR_DEPARTMENT_OTHER): Payer: BLUE CROSS/BLUE SHIELD

## 2016-07-18 ENCOUNTER — Telehealth: Payer: Self-pay | Admitting: Hematology

## 2016-07-18 ENCOUNTER — Encounter: Payer: Self-pay | Admitting: Hematology

## 2016-07-18 ENCOUNTER — Telehealth: Payer: Self-pay | Admitting: *Deleted

## 2016-07-18 ENCOUNTER — Other Ambulatory Visit (HOSPITAL_BASED_OUTPATIENT_CLINIC_OR_DEPARTMENT_OTHER): Payer: BLUE CROSS/BLUE SHIELD

## 2016-07-18 VITALS — BP 154/88 | HR 87 | Temp 99.0°F | Resp 20 | Wt 396.5 lb

## 2016-07-18 DIAGNOSIS — I10 Essential (primary) hypertension: Secondary | ICD-10-CM

## 2016-07-18 DIAGNOSIS — Z5111 Encounter for antineoplastic chemotherapy: Secondary | ICD-10-CM

## 2016-07-18 DIAGNOSIS — C19 Malignant neoplasm of rectosigmoid junction: Secondary | ICD-10-CM

## 2016-07-18 DIAGNOSIS — G620 Drug-induced polyneuropathy: Secondary | ICD-10-CM | POA: Diagnosis not present

## 2016-07-18 DIAGNOSIS — C186 Malignant neoplasm of descending colon: Secondary | ICD-10-CM

## 2016-07-18 DIAGNOSIS — E1165 Type 2 diabetes mellitus with hyperglycemia: Secondary | ICD-10-CM

## 2016-07-18 DIAGNOSIS — E119 Type 2 diabetes mellitus without complications: Secondary | ICD-10-CM

## 2016-07-18 DIAGNOSIS — C787 Secondary malignant neoplasm of liver and intrahepatic bile duct: Secondary | ICD-10-CM

## 2016-07-18 LAB — CBC WITH DIFFERENTIAL/PLATELET
BASO%: 0.7 % (ref 0.0–2.0)
BASOS ABS: 0.1 10*3/uL (ref 0.0–0.1)
EOS%: 2.6 % (ref 0.0–7.0)
Eosinophils Absolute: 0.2 10*3/uL (ref 0.0–0.5)
HEMATOCRIT: 40.6 % (ref 38.4–49.9)
HGB: 13.8 g/dL (ref 13.0–17.1)
LYMPH#: 2.2 10*3/uL (ref 0.9–3.3)
LYMPH%: 23.2 % (ref 14.0–49.0)
MCH: 31.7 pg (ref 27.2–33.4)
MCHC: 34.1 g/dL (ref 32.0–36.0)
MCV: 93 fL (ref 79.3–98.0)
MONO#: 0.9 10*3/uL (ref 0.1–0.9)
MONO%: 9.7 % (ref 0.0–14.0)
NEUT#: 5.9 10*3/uL (ref 1.5–6.5)
NEUT%: 63.8 % (ref 39.0–75.0)
PLATELETS: 352 10*3/uL (ref 140–400)
RBC: 4.36 10*6/uL (ref 4.20–5.82)
RDW: 12.6 % (ref 11.0–14.6)
WBC: 9.3 10*3/uL (ref 4.0–10.3)

## 2016-07-18 LAB — COMPREHENSIVE METABOLIC PANEL
ALT: 17 U/L (ref 0–55)
ANION GAP: 13 meq/L — AB (ref 3–11)
AST: 16 U/L (ref 5–34)
Albumin: 3.5 g/dL (ref 3.5–5.0)
Alkaline Phosphatase: 75 U/L (ref 40–150)
BUN: 11.4 mg/dL (ref 7.0–26.0)
CALCIUM: 9.9 mg/dL (ref 8.4–10.4)
CHLORIDE: 100 meq/L (ref 98–109)
CO2: 22 mEq/L (ref 22–29)
Creatinine: 0.9 mg/dL (ref 0.7–1.3)
Glucose: 360 mg/dl — ABNORMAL HIGH (ref 70–140)
POTASSIUM: 4.4 meq/L (ref 3.5–5.1)
Sodium: 135 mEq/L — ABNORMAL LOW (ref 136–145)
Total Bilirubin: 0.65 mg/dL (ref 0.20–1.20)
Total Protein: 7.9 g/dL (ref 6.4–8.3)

## 2016-07-18 LAB — CEA (IN HOUSE-CHCC): CEA (CHCC-IN HOUSE): 329.87 ng/mL — AB (ref 0.00–5.00)

## 2016-07-18 MED ORDER — PALONOSETRON HCL INJECTION 0.25 MG/5ML
0.2500 mg | Freq: Once | INTRAVENOUS | Status: AC
  Administered 2016-07-18: 0.25 mg via INTRAVENOUS

## 2016-07-18 MED ORDER — DEXAMETHASONE SODIUM PHOSPHATE 10 MG/ML IJ SOLN
INTRAMUSCULAR | Status: AC
  Filled 2016-07-18: qty 1

## 2016-07-18 MED ORDER — INSULIN REGULAR HUMAN 100 UNIT/ML IJ SOLN
8.0000 [IU] | Freq: Once | INTRAMUSCULAR | Status: AC
  Administered 2016-07-18: 8 [IU] via SUBCUTANEOUS
  Filled 2016-07-18: qty 0.08

## 2016-07-18 MED ORDER — ONDANSETRON HCL 8 MG PO TABS: 8.0000 mg | ORAL_TABLET | Freq: Three times a day (TID) | ORAL | 0 refills | Status: DC | PRN

## 2016-07-18 MED ORDER — PROCHLORPERAZINE MALEATE 10 MG PO TABS: 10.0000 mg | ORAL_TABLET | Freq: Four times a day (QID) | ORAL | 2 refills | Status: DC | PRN

## 2016-07-18 MED ORDER — DEXTROSE 5 % IV SOLN
130.0000 mg/m2 | Freq: Once | INTRAVENOUS | Status: AC
  Administered 2016-07-18: 400 mg via INTRAVENOUS
  Filled 2016-07-18: qty 80

## 2016-07-18 MED ORDER — DEXTROSE 5 % IV SOLN
Freq: Once | INTRAVENOUS | Status: AC
  Administered 2016-07-18: 12:00:00 via INTRAVENOUS

## 2016-07-18 MED ORDER — SODIUM CHLORIDE 0.9 % IV SOLN
4.0000 mg | Freq: Once | INTRAVENOUS | Status: AC
  Administered 2016-07-18: 4 mg via INTRAVENOUS
  Filled 2016-07-18: qty 0.4

## 2016-07-18 MED ORDER — PALONOSETRON HCL INJECTION 0.25 MG/5ML
INTRAVENOUS | Status: AC
  Filled 2016-07-18: qty 5

## 2016-07-18 NOTE — Telephone Encounter (Signed)
Appointments scheduled per 1/4 LOS. Patient left for infusion appointment will check mychart did not want printouts.

## 2016-07-18 NOTE — Patient Instructions (Signed)
[facility] Discharge Instructions for Patients Receiving Chemotherapy  Today you received the following chemotherapy agents Oxaliplatin  To help prevent nausea and vomiting after your treatment, we encourage you to take your nausea medication as directed.   If you develop nausea and vomiting that is not controlled by your nausea medication, call the clinic.   BELOW ARE SYMPTOMS THAT SHOULD BE REPORTED IMMEDIATELY:  *FEVER GREATER THAN 100.5 F  *CHILLS WITH OR WITHOUT FEVER  NAUSEA AND VOMITING THAT IS NOT CONTROLLED WITH YOUR NAUSEA MEDICATION  *UNUSUAL SHORTNESS OF BREATH  *UNUSUAL BRUISING OR BLEEDING  TENDERNESS IN MOUTH AND THROAT WITH OR WITHOUT PRESENCE OF ULCERS  *URINARY PROBLEMS  *BOWEL PROBLEMS  UNUSUAL RASH Items with * indicate a potential emergency and should be followed up as soon as possible.  Feel free to call the clinic you have any questions or concerns. The clinic phone number is [phone].  Please show the CHEMO ALERT CARD at check-in to the Emergency Department and triage nurse.    

## 2016-07-18 NOTE — Progress Notes (Signed)
START ON PATHWAY REGIMEN - Colorectal  [id number]: [id number] + Bevacizumab   A cycle is every 14 days:     Oxaliplatin (Eloxatin(R)) 85 mg/m2 in 250 mL D5W IV over 2 hours day 1, q14 days Dose Mod: None     Leucovorin 400 mg/m2 in 250 mL D5W IV over 2 hours day 1, q14 days,  followed immediately by Dose Mod: None     5-Fluorouracil 400 mg/m2 IV bolus over 2-4 minutes day 1, q14 days Dose Mod: None     5-Fluorouracil 2,400 mg/m2 in _____mL NS CIV as a 46 hour infusion starting on day 1, q14 days Dose Mod: None     Bevacizumab (Avastin(R)) 5 mg/kg in 100 mL NS IV every 2 weeks.  Administer first dose over 90 minutes if tolerated second dose over 60 minutes and if tolerated all subsequent doses over 30 minutes Dose Mod: None Additional Orders: **Note: order sheet contains two q14 day cycles**  **Always confirm dose/schedule in your pharmacy ordering system**    Patient Characteristics: Metastatic Colorectal, First Line, Nonsurgical Candidate, KRAS Mutation Positive/Unknown, BRAF Wild-Type/Unknown, PS = 0,1; Bevacizumab Eligible AJCC T Stage: X AJCC N Stage: X AJCC Stage Grouping: IVB AJCC M Stage: X Current evidence of distant metastases? Yes BRAF Mutation Status: Wild Type (no mutation) KRAS/NRAS Mutation Status: Mutation Positive Line of therapy: First Line Would you be surprised if this patient died  in the next year? I would be surprised if this patient died in the next year Performance Status: PS = 0, 1 Bevacizumab Eligibility: Eligible  Intent of Therapy: Non-Curative / Palliative Intent, Discussed with Patient

## 2016-07-18 NOTE — Telephone Encounter (Signed)
Per 1/4 LOS I have scheduled appt and gave calendar

## 2016-07-18 NOTE — Progress Notes (Signed)
Pt did not tolerate piv insertion well. Attempted iv access x6. Pt educated on a possibility of getting a port placement procedure done to lessen multiple iv access. Also discussed with pt on risk and benefits of having a port placed. Pt agreeable to plan and will have Dr.Feng's nurse to get pt scheduled for port placement. Pt states that oxaliplatin burns a lot through the veins and thinks that maybe a port placement would be more favorable for the pt for future infusions. Offered some warm packs for pt and diluted with extra nss to help lessen pain and burning.Will monitor iv closely during treatment.

## 2016-07-18 NOTE — Progress Notes (Signed)
Patient on plan of care prior to pathways. 

## 2016-07-18 NOTE — Progress Notes (Signed)
Chad [name]  [phone] Fax:([phone]) [phone]  Clinic Follow up Note   Patient Care Team: Chad Knapp, MD as PCP - General (Family Medicine) Chad Boston, MD as Consulting Physician (General Surgery) Chad Records, MD as Consulting Physician (Cardiology) Chad Mayer, MD as Consulting Physician (Gastroenterology) Chad Frock, MD as Consulting Physician (Urology) Chad Ade, RN as Registered Nurse 07/18/2016     CHIEF COMPLAINTS: Follow up colon [name]   Oncology History   Presented w/intermittent rectal bleeding and abdominal pain  [name] of rectosigmoid junction T2N1 (1/29 LN) s/p robotic LAR resection 10/18/2015   Staging form: Colon and Rectum, AJCC 7th Edition     Pathologic stage from 10/18/2015: Stage IIIA (T2, N1a, cM0) - Signed by Chad Merle, MD on 11/15/2015       [name] of left colon ([place])   05/11/2015 Imaging    CT ABD/PELVIS: Gallstones w/sludge;liver prominent without focal liver lesion, mild thickening of bladder wall; negative for [name]      05/18/2015 Imaging    [place] ABD: Negative      07/25/2015 Initial Diagnosis    [name] of rectosigmoid junction T2N1 (1/29 LN) s/p robotic LAR resection 10/18/2015      07/25/2015 Procedure    COLONOSCOPY: 1/4 circumference ulcerate mass in distal sigmoid-22 cm from verge. 8 mm descending polyp and 5 mm rectosigmoid polyp;18-29 mm proximal rectal polyp      07/25/2015 Tumor Marker    CEA=0.7      07/26/2015 Pathology Results    Sigmoid mass: invasive colorectal adenocarcinoma      10/22/2015 Pathologic Stage    T2, N1, MO    #29 nodes examined w/ 1 postive node; moderately differentiated; Negative for Lymph-vascular and Peri-neural invasion; Microscopic extension into muscularis propria      10/26/2015 Pathology Results    MSI Stable      11/30/2015 - 02/08/2016 Chemotherapy    Adjuvant chemotherapy with Capecitabine 2500 mg twice daily, on day 1-14, oxaliplatin 130 mg/m, every 21 days      05/01/2016 Imaging    Surveillance CT CAP scan showed new more focal low-density lesions in the liver, possible metastasis. No other new lesions.      06/05/2016 Imaging    MR ABDOMEN W WO CONTRAST IMPRESSION: 1. 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 2. No additional metastatic disease in the upper abdomen. 3. Cholelithiasis.      06/24/2016 Pathology Results    US Biopsy Diagnosis Liver, needle/core biopsy, right lobe - METASTATIC ADENOCARCINOMA, CONSISTENT WITH COLORECTAL PRIMARY.      07/16/2016 Miscellaneous    Foundation One results received       HISTORY OF PRESENTING ILLNESS:  Chad [name] 46 y.o. male is here because of recently diagnosed colon [name].  He is accompanied by his friend to the clinic today.  He presented with abdominal pain since Oct 2016,  Was seen in the emergency room on 05/11/2015,  CT abdomen and pelvis showed  Mild thickening of the urinary bladder wall, gallstones, otherwise negative. He was seen by her primary care physician Dr. Brigitte [name] afterwards, who referred him to GI Dr. Carlean [name].  His father passed away from Chad [name] around that time,  So his appointment was postponed, and he finally had colonoscopy in early January 2017, which showed multiple polyps, and  A ulcerated mass in distal sigmoid colon.  He was subsequently referred to colorectal surgeon Dr. Johney [name], and  Eventually had left colectomy  On 11/01/2015.  He tolerated surgery well, and recovered well. He has good appetite good, BM is normal most of time, occasional diarrhea, likely second to food, mild low abdomen pain, which has improves, he lost about 30 lbs before surgery and 10lbs after surgery.   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  He is divorced, he has a 56 year old daughter who lives with her mother in [place]. He has sisters and brothers, and her mother who live close by.   CURRENT THERAPY:   chemo CAPOX, Every 3 weeks, starting on 07/18/2016  INTERIM HISTORY: Chad [name] returns for follow-up and the first cycle chemotherapy. He feels well overall, had a few episodes of transient right upper quadrant abdominal pain in the past week, resolved spontaneously. He denies abdominal bloating, nausea, or other symptoms. His appetite and energy level has been maintained well, he continues working. He run out of his glipizide last week, and his sugar has been high.  MEDICAL HISTORY:  Past Medical History:  Diagnosis Date  . [name] Chad [name])    colon [name]  . Coronary artery disease   . Diabetes mellitus without complication ([place]) 01/6545   oral agents only (07/2015)  . ETOH abuse 2014  . Fatty liver 12/2014   on ultrasound. pt obese and abuses ETOH  . Gallstone 12/2014   GB stones and sludge on ultrasound and CT,  . GERD (gastroesophageal reflux disease)   . Hypertension   . Morbid obesity with BMI of 50.0-59.9, adult ([place]) 03/2013  . Rectal bleeding 05/2015   07/2015 colonoscopy with malignant appearing sigmoid mass. Additional rectal, descending, polyps  . Shoulder pain, bilateral    due to previous injury    SURGICAL HISTORY: Past Surgical History:  Procedure Laterality Date  . CARDIAC CATHETERIZATION N/A 04/06/2015   Procedure: Left Heart Cath and Coronary Angiography;  Surgeon: Chad Crome, MD;  Location: [facility];  Service: Cardiovascular;  Laterality: N/A;  . COLONOSCOPY N/A 07/25/2015   Procedure: COLONOSCOPY;  Surgeon: Chad Mayer, MD;  Location: [facility];  Service: Gastroenterology;  Laterality: N/A;  . CYSTOSCOPY  10/18/2015   Procedure: CYSTOSCOPY FLEXIBLE URETHRAL DIALATION AND FOLEY PLACEMENT;  Surgeon: Chad Frock, MD;  Location: [facility];  Service: Urology;;  . ESOPHAGOGASTRODUODENOSCOPY N/A 07/25/2015   Procedure: ESOPHAGOGASTRODUODENOSCOPY (EGD);  Surgeon: Chad Mayer, MD;  Location: [facility];  Service: Gastroenterology;  Laterality: N/A;    SOCIAL  HISTORY: Social History   Social History  . Marital status: Legally Separated    Spouse name: N/A  . Number of children: 1  . Years of education: N/A   Occupational History  . Manager    Social History Main Topics  . Smoking status: Never Smoker  . Smokeless tobacco: Current User     Comment: occasionally dips tobacco - has used for approx 20 yrs (12/21/15)  . Alcohol use Yes     Comment: heavy drinking for 10 years, 2-3  drinks of liqour a night, has been cutting back.    . Drug use: No  . Sexual activity: Not on file   Other Topics Concern  . Not on file   Social History Narrative   Divorced, has 84 yo daughter lives in [place]   Lives alone   Manages chain of car wash dealers       FAMILY HISTORY: Family History  Problem Relation Age of Onset  . Diabetes Father   . Pancreatic [name] Father  57    former smoker, but had not smoked in 50 years  . [name] Maternal Uncle     d. 58s; unknown type of [name] -metastatic throughout abdomen   . Other Brother     oldest brother has had a normal colonoscopy  . Heart attack Maternal Grandfather     d. 57-58; heavy smoker and drinker  . Heart attack Paternal Grandmother     d. early 40s  . Heart attack Paternal Grandfather 45  . Heart failure Maternal Uncle   . Skin [name] Maternal Uncle 42    NOS type; was out in the sun a lot  . Brain [name] Maternal Uncle     NOS type; mother's maternal half-brother dx. 42-68  . Cirrhosis Paternal Uncle   . [name] Paternal Uncle     (x2-3) uncles with NOS cancers; d. 11s    ALLERGIES:  has No Known Allergies.  MEDICATIONS:  Current Outpatient Prescriptions  Medication Sig Dispense Refill  . Ascorbic Acid (VITAMIN C) 1000 MG tablet Take 1,000 mg by mouth daily.    Marland Kitchen aspirin EC 81 MG tablet Take 1 tablet (81 mg total) by mouth daily. DO NOT TAKE AGAIN UNTIL 08/09/15    . capecitabine (XELODA) 500 MG tablet Take 5 tablets (2,500 mg total) by mouth 2 (two) times daily after a meal. 140  tablet 2  . carvedilol (COREG) 6.25 MG tablet Take 1 tablet (6.25 mg total) by mouth 2 (two) times daily with a meal. (Patient taking differently: Take 6.25 mg by mouth every morning. ) 180 tablet 1  . gabapentin (NEURONTIN) 300 MG capsule Take 1 capsule (300 mg total) by mouth at bedtime. 90 capsule 1  . glipiZIDE (GLUCOTROL XL) 5 MG 24 hr tablet Take 1 tablet (5 mg total) by mouth daily with breakfast. 90 tablet 0  . lisinopril-hydrochlorothiazide (PRINZIDE,ZESTORETIC) 20-25 MG tablet TAKE 1 TABLET BY MOUTH DAILY 90 tablet 0  . LORazepam (ATIVAN) 1 MG tablet TAKE 1 TABLET BY MOUTH AT BEDTIME AS NEEDED FOR ANXIETY 30 tablet 0  . metFORMIN (GLUCOPHAGE-XR) 500 MG 24 hr tablet Take 4 tablets once a day WITH BREAKFAST 360 tablet 3  . omeprazole (PRILOSEC) 40 MG capsule TAKE 1 CAPSULE(40 MG) BY MOUTH DAILY 90 capsule 0  . ondansetron (ZOFRAN) 8 MG tablet Take 1 tablet (8 mg total) by mouth every 8 (eight) hours as needed for nausea or vomiting. 30 tablet 0  . prochlorperazine (COMPAZINE) 10 MG tablet Take 1 tablet (10 mg total) by mouth every 6 (six) hours as needed for nausea or vomiting. 30 tablet 2  . sucralfate (CARAFATE) 1 g tablet   2   No current facility-administered medications for this visit.     REVIEW OF SYSTEMS:   Constitutional: Denies fevers, chills or abnormal night sweats Eyes: Denies blurriness of vision, double vision or watery eyes Ears, nose, mouth, throat, and face: Denies mucositis or sore throat Respiratory: Denies cough, dyspnea or wheezes Cardiovascular: Denies palpitation, chest discomfort or lower extremity swelling Gastrointestinal:  Denies nausea, heartburn or change in bowel habits Skin: Denies abnormal skin rashes Lymphatics: Denies new lymphadenopathy or easy bruising Neurological:Denies numbness, tingling or new weaknesses Behavioral/Psych: Mood is stable, no new changes  All other systems were reviewed with the patient and are negative.  PHYSICAL  EXAMINATION: ECOG PERFORMANCE STATUS: 0  Vitals:   07/18/16 1028  BP: (!) 154/88  [name]: 87  Resp: 20  Temp: 99 F (37.2 C)   Filed Weights  07/18/16 1028  Weight: (!) 396 lb 8 oz (179.9 kg)    GENERAL:alert,  Morbidly obese. SKIN: skin color, texture, turgor are normal, no rashes or significant lesions EYES: normal, conjunctiva are pink and non-injected, sclera clear OROPHARYNX:no exudate, no erythema and lips, buccal mucosa, and tongue normal  NECK: supple, thyroid normal size, non-tender, without nodularity LYMPH:  no palpable lymphadenopathy in the cervical, axillary or inguinal LUNGS: clear to auscultation and percussion with normal breathing effort HEART: regular rate & rhythm and no murmurs and no lower extremity edema ABDOMEN:abdomen soft,  Surgical incision and laparoscopic scars are well-healed. non-tender and normal bowel sounds Musculoskeletal:no cyanosis of digits and no clubbing  PSYCH: alert & oriented x 3 with fluent speech NEURO: no focal motor/sensory deficits  LABORATORY DATA:  I have reviewed the data as listed CBC Latest Ref Rng & Units 07/18/2016 06/27/2016 06/24/2016  WBC 4.0 - 10.3 10e3/uL 9.3 8.9 10.8(H)  Hemoglobin 13.0 - 17.1 g/dL 13.8 15.3 15.2  Hematocrit 38.4 - 49.9 % 40.6 45.1 44.0  Platelets 140 - 400 10e3/uL 352 286 314    CMP Latest Ref Rng & Units 07/18/2016 06/27/2016 05/01/2016  Glucose 70 - 140 mg/dl 360(H) 299(H) 210(H)  BUN 7.0 - 26.0 mg/dL 11.4 11.0 11.8  Creatinine 0.7 - 1.3 mg/dL 0.9 0.9 0.8  Sodium 136 - 145 mEq/L 135(L) 139 140  Potassium 3.5 - 5.1 mEq/L 4.4 4.4 4.3  Chloride 98 - 110 mmol/L - - -  CO2 22 - 29 mEq/L _0 Calcium 8.4 - 10.4 mg/dL 9.9 9.4 9.3  Total Protein 6.4 - 8.3 g/dL 7.9 7.6 7.5  Total Bilirubin 0.20 - 1.20 mg/dL 0.65 0.55 0.80  Alkaline Phos 40 - 150 U/L 75 71 57  AST 5 - 34 U/L _1 ALT 0 - 55 U/L 17 29 36   CEA:  07/25/2015: 0.7 11/30/2015: 4.5 05/01/2016: 57.89 (64.7)    PATHOLOGY  REPORT: US Biopsy 06/24/2016 Diagnosis Liver, needle/core biopsy, right lobe - METASTATIC ADENOCARCINOMA, CONSISTENT WITH COLORECTAL PRIMARY. - SEE COMMENT. Microscopic Comment Dr. Burr Medico was paged on 06/25/16. Per request, a block will be sent for Foundation 1 testing and the results reported separately. (JBK:gt, 06/25/16)   Diagnosis 10/18/2015 1. Colon, segmental resection for tumor, rectosigmoid INFILTRATIVE MODERATELY DIFFERENTIATED ADENOCARCINOMA OF THE COLON (3.8 CM) THE TUMOR INVADES MUSCULARIS PROPRIA MARGINS OF RESECTION ARE NEGATIVE FOR TUMOR METASTATIC COLONIC ADENOCARCINOMA IN ONE OF TWENTY-NINE LYMPH NODES (1/29) 2. Colon, resection margin (donut), final distal margin BENIGN COLONIC TISSUE, NEGATIVE FOR CARCINOMA Microscopic Comment 1. COLON AND RECTUM (INCLUDING TRANS-ANAL RESECTION): Specimen: Rectosigmoid colon Procedure: Segmental resection Tumor site: Rectal colon Specimen integrity: Intact Macroscopic intactness of mesorectum: Not applicable: NA Complete: NA Near complete: x Incomplete: NA Cannot be determined (specify): NA Macroscopic tumor perforation: Muscularis propria Invasive tumor: Maximum size: 3.8 cm Histologic type(s): Adenocarcinoma Histologic grade and differentiation: G2 G1: well differentiated/low grade G2: moderately differentiated/low grade G3: poorly differentiated/high grade G4: undifferentiated/high grade Type of polyp in which invasive carcinoma arose: Tubular adenoma Microscopic extension of invasive tumor:Muscularis propria Lymph-Vascular invasion: Negative Peri-neural invasion: Negative Tumor deposit(s) (discontinuous extramural extension): Negative Resection margins: Proximal margin: Negative Distal margin: Negative Circumferential (radial) (posterior ascending, posterior descending; lateral and posterior mid-rectum; and entire lower 1/3 rectum):Negative Mesenteric margin (sigmoid and transverse): Negative Distance closest  margin (if all above margins negative): 4.8 cm Trans-anal resection margins only: Deep margin: NA Mucosal Margin: NA Distance closest mucosal margin (if negative): NA Treatment effect (neo-adjuvant therapy):  NA Additional polyp(s): Hyperplastic poly Non-neoplastic findings: Unremarkable Lymph nodes: number examined 29; number positive: 1 Pathologic Staging: T2, N1, M0   Mismatch Repair (MMR) Protein Immunohistochemistry (IHC) IHC Expression Result: MLH1: Preserved nuclear expression (greater 50% tumor expression) MSH2: Preserved nuclear expression (greater 50% tumor expression) MSH6: Preserved nuclear expression (greater 50% tumor expression) PMS2: Preserved nuclear expression (greater 50% tumor expression) * Internal control demonstrates intact nuclear expression Interpretation: NORMAL     Diagnosis 07/25/2015 1. Colon, polyp(s), descending - TUBULAR ADENOMA. NO HIGH GRADE DYSPLASIA OR MALIGNANCY IDENTIFIED. 2. Colon, biopsy, distal sigmoid mass - INVASIVE COLORECTAL ADENOCARCINOMA. 3. Colon, polyp(s), small recto sigmoid - HYPERPLASTIC POLYP. NO ADENOMATOUS CHANGE OR MALIGNANCY. 4. Rectum, polyp(s) - TUBULOVILLOUS ADENOMA. NO HIGH GRADE DYSPLASIA OR MALIGNANCY IDENTIFIED.   RADIOGRAPHIC STUDIES: I have personally reviewed the radiological images as listed and agreed with the findings in the report.  CT chest, abdomen and pelvis w contrast 05/01/2016  IMPRESSION: 1. Stable chest CT.  No evidence of thoracic metastatic disease. 2. Progressive heterogeneity of the liver could be due to heterogeneous steatosis. However, there are new more focal low-density lesions which are worrisome for possible metastatic disease given the patient's history. Further evaluation with hepatic MRI recommended. 3. Resolving postsurgical retroperitoneal fluid collections status post partial colon resection and anastomosis.  Abdominal MRI w wo contrast 06/05/2016 IMPRESSION: 1. Six  bilobar enhancing lesions consistent with hepatic metastasis. 2. No additional metastatic disease in the upper abdomen. 3. Cholelithiasis.   COLONOSCOPY 07/25/2015 ENDOSCOPIC IMPRESSION: 1) 1/4 circumference firm ulcerated mass in distal sigmmoid - approx 22 cm from verge. Looks like [name]. Biopsied. 2) 8 mm descending polyp and 5 mm rectosigmoid polyp removed cold snare, completely recovered and sent to path. 3) 18-20 mm pedunculated proximal rectal polyp removed hot snare and completely recovered for pathology. 4) Otherwise normal colonoscopy RECOMMENDATIONS: 1. Hold Aspirin and all other NSAIDS for 2 weeks. 2. Will call pathology results and plans though anticipate surgical referral after pathology in. CEA will be drawn at [name] today. He has had recent CT's of chest, and abd/pelvis - will see if he needs new ones - he may.  EGD 07/25/2015 ENDOSCOPIC IMPRESSION: Normal appearing esophagus and GE junction, the stomach was well visualized and normal in appearance, normal appearing duodenum  ASSESSMENT & PLAN:  46 y.o.  Caucasian male, with past medical history of heavy alcohol drinking, hypertension, diabetes, coronary artery disease, presented with abdominal pain.  1.  [name] of left colon, Distal sigmoid, invasive adenocarcinoma, G2, pT2N1aM0, stage IIIA, MSI-stable, liver metastasis in 04/2016 -I initially reviewed his CT scan findings, and the surgical pathology findings include details with patient. -he has locally advanced sigmoid colon [name], with one out of 29 lymph nodes positive. -We previously discussed the risk of [name] recurrence after's complete surgical resection. Giving his stage IIIa disease, he is at moderate to high risk for recurrence. -He has completed 3 months of adjuvant chemotherapy CAPOX, tolerated well. -I previously discussed his surveillance CT scan, which unfortunately showed multiple liver lesions, suspicious for metastasis.  -His CEA level has  significantly increased lately, which is concerning for [name] recurrence.  -I previously discussed his abdominal MRI findings, and personally reviewed the image with patient, unfortunately MRI confirmed at least a 6 metastatic lesions in the liver, largest 4.2 cm. Giving the multiple liver metastasis, this is likely incurable metastatic [name]. -He underwent ultrasound-guided liver mass biopsy to confirm metastasis, which revealed metastatic adenocarcinoma consistent with a colorectal primary. Foundation One test is pending.  I discussed with pt  -We discussed chemotherapy options. Due to his work schedule and previous good tolerance to CAPEOX, he prefered to do CAPOX. -Based on the Foundation one results, I'll add on Avastin or EGFR inhibitor to his chemotherapy, the result is still pending  -lab reviewed, adequate for treatment, will proceed first cycle chemotherapy CAPOX on 07/18/2016 -Potential side effects of chemotherapy, especially cold sensitivity, reviewed with patient again  -I reviewed his Compazine and Zofran  2. Genetics -per NCCN guideline, due to his young age and family history of pancreatic [name], he is qualified for genetic testing to ruled out inheritable genetic syndromes. -His genetic testing was negative. APC VUS called [id number] found on the colorectal [name] panel.  The Colorectal [name] Panel offered by [facility] includes sequencing and/or duplication/deletion testing of the following 19 genes: APC, ATM, AXIN2, BMPR1A, CDH1, CHEK2, EPCAM, MLH1, MSH2, MSH6, MUTYH, PMS2, POLD1, POLE, PTEN, SCG5/GREM1, SMAD4, STK11, and TP53.  3. HTN, DM, CAD, morbid obesity -he will continue follow-up with his primary care physician -We will monitor his blood pressure, glucose closely during the chemotherapy, and may need to adjust his the medication if needed. -I strongly encouraged him to see Dr. Brigitte [name] to adjust his diabetic medication due to his uncontrolled hyperglycemia and hypertension --his  BG again is very high, 360 today, he will refill his glipizide, and monitor his BG at home. I will give 8u insulin before lunch at infusion room today  -We discussed his premedication dexamethasone will increase his blood sugar, diabetic diet discussed again.  4. History of alcohol abuse -He has stopped alcohol completely since he started chemotherapy.   5. 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  -Near resolved now  6. Coping and social issues  -The patient is having a hard time with his diagnosis. He has not spoken with his family about his diagnosis. He is spending the holidays by himself and will tell his family after the holidays. -I offered social work to help whenever he is in need of it. He declined for now   7. IV access -he had difficulty to access his vein today during chemo -he agrees with port placement, I will contact Dr. Johney [name] today   Plan -Lab reviewed, we'll proceed first dose Xeloda and oxaliplatin today  -lab, f/u and chemo in 2 weeks  -will give 8u insulin before lunch during chemo today  -will ask Dr. Johney [name] to put a med port in for him in the next few weeks.   All questions were answered. The patient knows to call the clinic with any problems, questions or concerns.  I spent 20 minutes counseling the patient face to face. The total time spent in the appointment was 25 minutes and more than 50% was on counseling.     Chad Merle, MD 07/18/2016

## 2016-07-19 ENCOUNTER — Ambulatory Visit: Payer: Self-pay | Admitting: Surgery

## 2016-07-19 LAB — CEA: CEA1: 310 ng/mL — AB (ref 0.0–4.7)

## 2016-07-22 ENCOUNTER — Encounter (HOSPITAL_COMMUNITY): Payer: Self-pay

## 2016-07-30 ENCOUNTER — Other Ambulatory Visit: Payer: Self-pay | Admitting: Family Medicine

## 2016-07-31 ENCOUNTER — Encounter (HOSPITAL_BASED_OUTPATIENT_CLINIC_OR_DEPARTMENT_OTHER): Payer: Self-pay | Admitting: *Deleted

## 2016-07-31 MED ORDER — DEXTROSE 5 % IV SOLN
3.0000 g | INTRAVENOUS | Status: AC
  Administered 2016-08-01: 3 g via INTRAVENOUS
  Filled 2016-07-31 (×2): qty 3000

## 2016-07-31 NOTE — Progress Notes (Signed)
Spoke with pt for pre-op call. Pt not happy to have do the call, very short with his answers. Pt has hx of CAD, never has had to have stents. States he has had chest pain for years, that it's "well documented" in his chart. Pt is diabetic, last A1C was 8.3 on 04/03/16. Pt states his fasting blood sugar is usually between 175-230. Instructed pt not to take his Glipizide and Metformin in the AM.

## 2016-08-01 ENCOUNTER — Ambulatory Visit (HOSPITAL_COMMUNITY): Payer: BLUE CROSS/BLUE SHIELD

## 2016-08-01 ENCOUNTER — Encounter (HOSPITAL_COMMUNITY)
Admission: RE | Disposition: A | Discharge status: Home / Self Care | Payer: Self-pay | Source: Ambulatory Visit | Attending: Surgery

## 2016-08-01 ENCOUNTER — Ambulatory Visit (HOSPITAL_BASED_OUTPATIENT_CLINIC_OR_DEPARTMENT_OTHER)
Admission: RE | Admit: 2016-08-01 | Discharge: 2016-08-01 | Disposition: A | Discharge status: Home / Self Care | Payer: BLUE CROSS/BLUE SHIELD | Source: Ambulatory Visit | Attending: Surgery | Admitting: Surgery

## 2016-08-01 ENCOUNTER — Ambulatory Visit (HOSPITAL_COMMUNITY): Payer: BLUE CROSS/BLUE SHIELD | Admitting: Certified Registered Nurse Anesthetist

## 2016-08-01 ENCOUNTER — Encounter (HOSPITAL_COMMUNITY): Payer: Self-pay | Admitting: Surgery

## 2016-08-01 DIAGNOSIS — C779 Secondary and unspecified malignant neoplasm of lymph node, unspecified: Secondary | ICD-10-CM | POA: Diagnosis not present

## 2016-08-01 DIAGNOSIS — Z95828 Presence of other vascular implants and grafts: Secondary | ICD-10-CM

## 2016-08-01 DIAGNOSIS — Z79899 Other long term (current) drug therapy: Secondary | ICD-10-CM | POA: Insufficient documentation

## 2016-08-01 DIAGNOSIS — C19 Malignant neoplasm of rectosigmoid junction: Secondary | ICD-10-CM | POA: Insufficient documentation

## 2016-08-01 DIAGNOSIS — I10 Essential (primary) hypertension: Secondary | ICD-10-CM | POA: Diagnosis not present

## 2016-08-01 DIAGNOSIS — C189 Malignant neoplasm of colon, unspecified: Secondary | ICD-10-CM

## 2016-08-01 DIAGNOSIS — Z7984 Long term (current) use of oral hypoglycemic drugs: Secondary | ICD-10-CM | POA: Diagnosis not present

## 2016-08-01 DIAGNOSIS — E119 Type 2 diabetes mellitus without complications: Secondary | ICD-10-CM | POA: Insufficient documentation

## 2016-08-01 DIAGNOSIS — Z419 Encounter for procedure for purposes other than remedying health state, unspecified: Secondary | ICD-10-CM

## 2016-08-01 DIAGNOSIS — C187 Malignant neoplasm of sigmoid colon: Secondary | ICD-10-CM | POA: Diagnosis present

## 2016-08-01 DIAGNOSIS — Z7982 Long term (current) use of aspirin: Secondary | ICD-10-CM | POA: Insufficient documentation

## 2016-08-01 DIAGNOSIS — C787 Secondary malignant neoplasm of liver and intrahepatic bile duct: Secondary | ICD-10-CM

## 2016-08-01 HISTORY — PX: PORTACATH PLACEMENT: SHX2246

## 2016-08-01 LAB — GLUCOSE, CAPILLARY
GLUCOSE-CAPILLARY: 188 mg/dL — AB (ref 65–99)
Glucose-Capillary: 231 mg/dL — ABNORMAL HIGH (ref 65–99)

## 2016-08-01 SURGERY — INSERTION, TUNNELED CENTRAL VENOUS DEVICE, WITH PORT
Anesthesia: General | Site: Chest | Laterality: Right

## 2016-08-01 MED ORDER — ONDANSETRON HCL 4 MG/2ML IJ SOLN
INTRAMUSCULAR | Status: AC
  Filled 2016-08-01: qty 2

## 2016-08-01 MED ORDER — LIDOCAINE 2% (20 MG/ML) 5 ML SYRINGE
INTRAMUSCULAR | Status: AC
  Filled 2016-08-01: qty 5

## 2016-08-01 MED ORDER — PHENYLEPHRINE 40 MCG/ML (10ML) SYRINGE FOR IV PUSH (FOR BLOOD PRESSURE SUPPORT)
PREFILLED_SYRINGE | INTRAVENOUS | Status: AC
  Filled 2016-08-01: qty 10

## 2016-08-01 MED ORDER — BUPIVACAINE-EPINEPHRINE (PF) 0.5% -1:200000 IJ SOLN
INTRAMUSCULAR | Status: DC | PRN
  Administered 2016-08-01: 30 mL

## 2016-08-01 MED ORDER — PROPOFOL 10 MG/ML IV BOLUS
INTRAVENOUS | Status: AC
  Filled 2016-08-01: qty 20

## 2016-08-01 MED ORDER — FENTANYL CITRATE (PF) 100 MCG/2ML IJ SOLN: 25.0000 ug | INTRAMUSCULAR | Status: DC | PRN

## 2016-08-01 MED ORDER — SUGAMMADEX SODIUM 200 MG/2ML IV SOLN
INTRAVENOUS | Status: DC | PRN
  Administered 2016-08-01: 800 mg via INTRAVENOUS

## 2016-08-01 MED ORDER — CHLORHEXIDINE GLUCONATE CLOTH 2 % EX PADS: 6.0000 | MEDICATED_PAD | Freq: Once | CUTANEOUS | Status: DC

## 2016-08-01 MED ORDER — PROPOFOL 10 MG/ML IV BOLUS
INTRAVENOUS | Status: DC | PRN
  Administered 2016-08-01 (×2): 50 mg via INTRAVENOUS

## 2016-08-01 MED ORDER — BUPIVACAINE HCL (PF) 0.25 % IJ SOLN
INTRAMUSCULAR | Status: AC
  Filled 2016-08-01: qty 30

## 2016-08-01 MED ORDER — MIDAZOLAM HCL 5 MG/5ML IJ SOLN
INTRAMUSCULAR | Status: DC | PRN
  Administered 2016-08-01: 2 mg via INTRAVENOUS

## 2016-08-01 MED ORDER — FENTANYL CITRATE (PF) 100 MCG/2ML IJ SOLN
INTRAMUSCULAR | Status: DC | PRN
  Administered 2016-08-01 (×2): 50 ug via INTRAVENOUS

## 2016-08-01 MED ORDER — ONDANSETRON HCL 4 MG/2ML IJ SOLN
INTRAMUSCULAR | Status: AC
  Administered 2016-08-01: 4 mg via INTRAVENOUS
  Filled 2016-08-01: qty 2

## 2016-08-01 MED ORDER — 0.9 % SODIUM CHLORIDE (POUR BTL) OPTIME
TOPICAL | Status: DC | PRN
  Administered 2016-08-01: 1000 mL

## 2016-08-01 MED ORDER — PHENYLEPHRINE HCL 10 MG/ML IJ SOLN
INTRAMUSCULAR | Status: DC | PRN
  Administered 2016-08-01: 40 ug/min via INTRAVENOUS

## 2016-08-01 MED ORDER — SUGAMMADEX SODIUM 500 MG/5ML IV SOLN
INTRAVENOUS | Status: AC
  Filled 2016-08-01: qty 10

## 2016-08-01 MED ORDER — FENTANYL CITRATE (PF) 100 MCG/2ML IJ SOLN
INTRAMUSCULAR | Status: AC
  Filled 2016-08-01: qty 2

## 2016-08-01 MED ORDER — CELECOXIB 200 MG PO CAPS
400.0000 mg | ORAL_CAPSULE | ORAL | Status: AC
  Administered 2016-08-01: 400 mg via ORAL
  Filled 2016-08-01: qty 2

## 2016-08-01 MED ORDER — HEPARIN SOD (PORK) LOCK FLUSH 100 UNIT/ML IV SOLN
INTRAVENOUS | Status: AC
  Filled 2016-08-01: qty 5

## 2016-08-01 MED ORDER — LACTATED RINGERS IV SOLN
INTRAVENOUS | Status: DC
  Administered 2016-08-01 (×3): via INTRAVENOUS

## 2016-08-01 MED ORDER — PHENYLEPHRINE HCL 10 MG/ML IJ SOLN
INTRAMUSCULAR | Status: DC | PRN
  Administered 2016-08-01 (×8): 80 ug via INTRAVENOUS

## 2016-08-01 MED ORDER — ONDANSETRON HCL 4 MG/2ML IJ SOLN
4.0000 mg | Freq: Once | INTRAMUSCULAR | Status: AC | PRN
  Administered 2016-08-01: 4 mg via INTRAVENOUS

## 2016-08-01 MED ORDER — SUCCINYLCHOLINE CHLORIDE 200 MG/10ML IV SOSY
PREFILLED_SYRINGE | INTRAVENOUS | Status: AC
  Filled 2016-08-01: qty 10

## 2016-08-01 MED ORDER — SODIUM CHLORIDE 0.9 % IV SOLN
INTRAVENOUS | Status: DC | PRN
  Administered 2016-08-01: 13:00:00

## 2016-08-01 MED ORDER — ROCURONIUM BROMIDE 50 MG/5ML IV SOSY
PREFILLED_SYRINGE | INTRAVENOUS | Status: AC
  Filled 2016-08-01: qty 5

## 2016-08-01 MED ORDER — MIDAZOLAM HCL 2 MG/2ML IJ SOLN
INTRAMUSCULAR | Status: AC
  Filled 2016-08-01: qty 2

## 2016-08-01 MED ORDER — ACETAMINOPHEN 500 MG PO TABS
1000.0000 mg | ORAL_TABLET | ORAL | Status: AC
  Administered 2016-08-01: 1000 mg via ORAL
  Filled 2016-08-01: qty 2

## 2016-08-01 MED ORDER — LIDOCAINE HCL (CARDIAC) 20 MG/ML IV SOLN
INTRAVENOUS | Status: DC | PRN
  Administered 2016-08-01: 40 mg via INTRAVENOUS

## 2016-08-01 MED ORDER — SUCCINYLCHOLINE CHLORIDE 20 MG/ML IJ SOLN
INTRAMUSCULAR | Status: DC | PRN
  Administered 2016-08-01: 200 mg via INTRAVENOUS

## 2016-08-01 MED ORDER — ROCURONIUM BROMIDE 100 MG/10ML IV SOLN
INTRAVENOUS | Status: DC | PRN
  Administered 2016-08-01: 40 mg via INTRAVENOUS

## 2016-08-01 MED ORDER — HEPARIN SOD (PORK) LOCK FLUSH 100 UNIT/ML IV SOLN
INTRAVENOUS | Status: DC | PRN
  Administered 2016-08-01: 500 [IU] via INTRAVENOUS

## 2016-08-01 MED ORDER — BUPIVACAINE-EPINEPHRINE (PF) 0.5% -1:200000 IJ SOLN
INTRAMUSCULAR | Status: AC
  Filled 2016-08-01: qty 30

## 2016-08-01 MED ORDER — LIDOCAINE HCL (PF) 1 % IJ SOLN
INTRAMUSCULAR | Status: AC
  Filled 2016-08-01: qty 30

## 2016-08-01 MED ORDER — ONDANSETRON HCL 4 MG/2ML IJ SOLN
INTRAMUSCULAR | Status: DC | PRN
  Administered 2016-08-01: 4 mg via INTRAVENOUS

## 2016-08-01 MED ORDER — GABAPENTIN 300 MG PO CAPS
300.0000 mg | ORAL_CAPSULE | ORAL | Status: AC
  Administered 2016-08-01: 300 mg via ORAL
  Filled 2016-08-01: qty 1

## 2016-08-01 SURGICAL SUPPLY — 47 items
BAG DECANTER FOR FLEXI CONT (MISCELLANEOUS) ×3 IMPLANT
BLADE SURG 11 STRL SS (BLADE) ×3 IMPLANT
BLADE SURG 15 STRL LF DISP TIS (BLADE) ×1 IMPLANT
BLADE SURG 15 STRL SS (BLADE) ×2
CHLORAPREP W/TINT 10.5 ML (MISCELLANEOUS) ×3 IMPLANT
COVER PROBE W GEL 5X96 (DRAPES) ×3 IMPLANT
COVER SURGICAL LIGHT HANDLE (MISCELLANEOUS) ×3 IMPLANT
CRADLE DONUT ADULT HEAD (MISCELLANEOUS) ×3 IMPLANT
DRAPE C-ARM 42X72 X-RAY (DRAPES) ×3 IMPLANT
DRAPE LAPAROTOMY TRNSV 102X78 (DRAPE) ×3 IMPLANT
DRSG TEGADERM 2-3/8X2-3/4 SM (GAUZE/BANDAGES/DRESSINGS) ×6 IMPLANT
DRSG TEGADERM 4X4.75 (GAUZE/BANDAGES/DRESSINGS) ×3 IMPLANT
ELECT CAUTERY BLADE 6.4 (BLADE) ×3 IMPLANT
ELECT REM PT RETURN 9FT ADLT (ELECTROSURGICAL) ×3
ELECTRODE REM PT RTRN 9FT ADLT (ELECTROSURGICAL) ×1 IMPLANT
GAUZE SPONGE 2X2 8PLY STRL LF (GAUZE/BANDAGES/DRESSINGS) ×2 IMPLANT
GAUZE SPONGE 4X4 16PLY XRAY LF (GAUZE/BANDAGES/DRESSINGS) ×3 IMPLANT
GLOVE BIO SURGEON STRL SZ 6.5 (GLOVE) ×4 IMPLANT
GLOVE BIO SURGEONS STRL SZ 6.5 (GLOVE) ×2
GLOVE BIOGEL PI IND STRL 8 (GLOVE) ×1 IMPLANT
GLOVE BIOGEL PI INDICATOR 8 (GLOVE) ×2
GLOVE ECLIPSE 8.0 STRL XLNG CF (GLOVE) ×3 IMPLANT
GOWN STRL REUS W/ TWL LRG LVL3 (GOWN DISPOSABLE) ×3 IMPLANT
GOWN STRL REUS W/ TWL XL LVL3 (GOWN DISPOSABLE) ×1 IMPLANT
GOWN STRL REUS W/TWL LRG LVL3 (GOWN DISPOSABLE) ×6
GOWN STRL REUS W/TWL XL LVL3 (GOWN DISPOSABLE) ×2
KIT BASIN OR (CUSTOM PROCEDURE TRAY) ×3 IMPLANT
KIT POWER CATH 8FR (Port) ×3 IMPLANT
KIT ROOM TURNOVER OR (KITS) ×3 IMPLANT
NEEDLE 22X1 1/2 (OR ONLY) (NEEDLE) ×3 IMPLANT
NS IRRIG 1000ML POUR BTL (IV SOLUTION) ×3 IMPLANT
PACK SURGICAL SETUP 50X90 (CUSTOM PROCEDURE TRAY) ×3 IMPLANT
PAD ARMBOARD 7.5X6 YLW CONV (MISCELLANEOUS) ×12 IMPLANT
PENCIL BUTTON HOLSTER BLD 10FT (ELECTRODE) ×3 IMPLANT
SPONGE GAUZE 2X2 STER 10/PKG (GAUZE/BANDAGES/DRESSINGS) ×4
SUT MNCRL AB 4-0 PS2 18 (SUTURE) ×3 IMPLANT
SUT MON AB 4-0 PC3 18 (SUTURE) ×3 IMPLANT
SUT PROLENE 2 0 CT2 30 (SUTURE) ×6 IMPLANT
SUT VIC AB 3-0 SH 27 (SUTURE) ×2
SUT VIC AB 3-0 SH 27X BRD (SUTURE) ×1 IMPLANT
SYR 10ML LL (SYRINGE) ×3 IMPLANT
SYR 20ML ECCENTRIC (SYRINGE) ×6 IMPLANT
SYR 5ML LUER SLIP (SYRINGE) ×3 IMPLANT
SYR CONTROL 10ML LL (SYRINGE) ×6 IMPLANT
SYRINGE 20CC LL (MISCELLANEOUS) ×3 IMPLANT
TOWEL OR 17X24 6PK STRL BLUE (TOWEL DISPOSABLE) ×3 IMPLANT
TOWEL OR 17X26 10 PK STRL BLUE (TOWEL DISPOSABLE) ×3 IMPLANT

## 2016-08-01 NOTE — Op Note (Signed)
08/01/2016  1:44 PM  PATIENT:  Chad [name]  46 y.o. male  Patient Care Team: Shawnee Knapp, MD as PCP - General (Family Medicine) Michael Boston, MD as Consulting Physician (General Surgery) Fay Records, MD as Consulting Physician (Cardiology) Gatha Mayer, MD as Consulting Physician (Gastroenterology) Alexis Frock, MD as Consulting Physician (Urology) Tania Ade, RN as Registered Nurse Truitt Merle, MD as Consulting Physician (Medical Oncology)  PRE-OPERATIVE DIAGNOSIS:  Metastatic colon cancer, need for chemotherapy  [name]-OPERATIVE DIAGNOSIS:  Metastatic colon cancer, need for chemotherapy  PROCEDURE:   INSERTION PORT-A-CATH WITH ULTRASOUND AND FLUOROSOPIC GUIDANCE - RIGHT INTERNAL JUGULAR  SURGEON:  [name], MD  ASSISTANT: Edward Qualia, PA-S, [employer]  ANESTHESIA:   local and general  EBL:  Total I/O In: 1000 [I.V.:1000] Out: -   Delay start of Pharmacological VTE agent (>24hrs) due to surgical blood loss or risk of bleeding:  no  DRAINS: none   SPECIMEN:  No Specimen  DISPOSITION OF SPECIMEN:  N/A  COUNTS:  YES  PLAN OF CARE: Discharge to home after PACU  PATIENT DISPOSITION:  PACU - hemodynamically stable.  Indications:  Patient with need for IV therapy for chemotherapy.    Port-A-Cath placement was requested.  Use of a central venous catheter for intravenous therapy was discussed.  Technique of catheter placement using ultrasound and fluoroscopy guidance was discussed.  Risks such as bleeding, infection, pneumothorax, catheter occlusion, reoperation, and other risks were discussed.   I noted a good likelihood this will help address the problem.  Questions were answered.  The patient expressed understanding & wishes to proceed.  Findings:   Normal-appearing anatomy.  Is an 8 [place] power port. It goes through the right internal jugular vein  Procedure: Informed consent was confirmed. Patient was brought the operating room. and  positioned supine. Arms were tucked. The patient underwent deep sedation. Neck and chest were clipped and prepped and draped in a sterile fashion. A surgical timeout confirmed our plan.  I placed a field block of local anesthesia on the neck & chest  I used the ultrasound to identify the right internal jugular vein. I entered into it using on the first venipuncture under direct ultrasound guidance.  Wire did not pass well.  Fluoroscopy confirmed it was more coiled in the subcutaneous.  Repeat venipuncture done.  Wire was passed into the inferior vena cava by fluoroscopy.  I made an incision in the lateral infraclavicular pocket. Made a subcutaneous pocket. I tunneled the power port from the chest wound to the neck puncture site. The port was secured to the left anterior chest wall using 2-0 Prolene interrupted stitches x4. Catheter flushed well.  I used a dilator on the wire using Seldinger technique to dilate the neck tract. Then I used a dilator with a peel away sheath.  We used fluoroscopy.  I pulled the wire back into the right atrial/superior vena cava junction.  I pulled the wire back until it was at the neck puncture site. This gave the true measurement of the intravenous segment. I cut the catheter to appropriate length. I removed the wire and dilator. The catheter was placed within the sheath. The sheath was peeled away.  Port would not aspirate or flush well.  It seemed stuck in the I cannot get it unkinked her well.  Therefore port did venipuncture redone.  Guidewire easily passed.  Was able to place the catheter three new peel-away sheath.  Fluoroscopy confirmed the tip was at  the distal SVC .  Catheter aspirated and flushed well.   Full-strength heparin flush done 2 mm.  On final fluoro reevaluation the tip seen to be in good position in the distal SVC.    I closed the wounds using 4 Monocryl stitch & placed sterile dressings. Patient should go home later today. Catheter is okay to  use.  [name], M.D., F.A.C.S. Gastrointestinal and Minimally Invasive Surgery [facility] [address], [place], [place] [address] [phone] Main / Paging

## 2016-08-01 NOTE — Transfer of Care (Signed)
Immediate Anesthesia Transfer of Care Note  Patient: Chad [name]  Procedure(s) Performed: Procedure(s): INSERTION PORT-A-CATH WITH ULTRASOUND AND FLUOROSOPIC GUIDANCE - RIGHT INTERNAL JUGULAR (Right)  Patient Location: PACU  Anesthesia Type:General  Level of Consciousness: awake, alert  and oriented  Airway & Oxygen Therapy: Patient Spontanous Breathing and Patient connected to face mask oxygen  Post-op Assessment: Report given to RN and Post -op Vital signs reviewed and stable  Post vital signs: Reviewed and stable  Last Vitals:  Vitals:   08/01/16 0929 08/01/16 1402  BP: (!) 167/75 (!) 145/83  Pulse: 79 79  Resp: 20 (!) 28  Temp: 37.2 C 36.5 C    Last Pain:  Vitals:   08/01/16 1402  TempSrc:   PainSc: 0-No pain      Patients Stated Pain Goal: 3 ([phone] [id number])  Complications: No apparent anesthesia complications

## 2016-08-01 NOTE — OR Nursing (Signed)
PCXR done.   IMPRESSION: Central venous line in distal SVC.  No pneumothorax Per radiologist.

## 2016-08-01 NOTE — Discharge Instructions (Signed)
PORT-A-CATHETER PLACEMENT: ° °INSTRUCTIONS AFTER SURGERY ° °DIET: Follow a light bland diet the first night after arrival home, such as soup, liquids, crackers, etc.  Be sure to include lots of fluids daily.  Avoid fast food or heavy meals as your are more likely to get nauseated.  °  °Take your usually prescribed home medications unless otherwise directed. ° °PAIN CONTROL: °Pain is best controlled by a usual combination of three different methods TOGETHER: °Ice/Heat °Over the counter acetaminophen pain medication °Prescription pain medication ° °Most patients will experience some swelling and bruising around the incisions.  Ice packs or heating pads (30-60 minutes up to 6 times a day) will help. Use ice for the first few days to help decrease swelling and bruising, then switch to heat to help relax tight/sore spots and speed recovery.  Some people prefer to use ice alone, heat alone, alternating between ice & heat.  Experiment to what works for you.  Swelling and bruising can take several weeks to resolve.   ° °It is helpful to take an over-the-counter pain medication regularly for the first few weeks.  Using acetaminophen (Tylenol, etc) 500-650mg four times a day (every meal & bedtime) is usually safest since NSAIDs like ibuprofen are not advisable in patients with kidney disease. ° °A  prescription for pain medication (such as oxycodone, hydrocodone, etc) may be given to you upon discharge.  Take your pain medication as prescribed.  ° °If you are having problems/concerns with the prescription medicine (does not control pain, nausea, vomiting, rash, itching, etc), please call us [phone] to see if we need to switch you to a different pain medicine that will work better for you and/or control your side effect better. °If you need a refill on your pain medication, please contact your pharmacy.  They will contact our office to request authorization. Prescriptions will not be filled after 5 pm or on  week-ends. ° °Avoid getting constipated.   °Between the surgery and the pain medications, it is common to experience some constipation.  Increasing fluid intake and taking a fiber supplement (such as Metamucil, Citrucel, FiberCon, MiraLax, etc) 1-2 times a day regularly will usually help prevent this problem from occurring.  A mild laxative (prune juice, Milk of Magnesia, MiraLax, etc) should be taken according to package directions if there are no bowel movements after 48 hours.   ° °Wash / shower every day.   °You may shower over the dressings as they are waterproof.  Continue to shower over incision(s) after the dressing is off. ° °The Chemotherapy / Oncology nurse will remove your waterproof bandages in their office a few days after surgery.  Do not remove the bandages unless you are 5 days out from surgery ° °ACTIVITIES as tolerated:   °You may resume regular (light) daily activities beginning the next day--such as daily self-care, walking, climbing stairs--gradually increasing activities as tolerated.  If you can walk 30 minutes without difficulty, it is safe to try more intense activity such as jogging, treadmill, bicycling, low-impact aerobics, swimming, etc. °Save the most intensive and strenuous activity for last such as push-ups, heavy lifting, contact sports, etc  Refrain from any heavy lifting or straining until you are off narcotics for pain control.   ° °DO NOT PUSH THROUGH PAIN.  Let pain be your guide: If it hurts to do something, don't do it.  Pain is your body warning you to avoid that activity for another week until the pain goes down. °You may drive   when you are no longer taking prescription pain medication, you can comfortably wear a seatbelt, and you can safely maneuver your car and apply brakes. °You may have sexual intercourse when it is comfortable.  ° °FOLLOW UP with the Oncology / Chemotherapy nurses closely after surgery. °Please call [facility] at [phone] only as needed. ° °The infusion  nurses & Oncology usually follow you closely, making the need for follow-up in our office redundant and therefore not needed.  If they or you have concerns, please call us for possible follow-up in our office °9. IF YOU HAVE DISABILITY OR FAMILY LEAVE FORMS, BRING THEM TO THE OFFICE FOR PROCESSING.  DO NOT GIVE THEM TO YOUR DOCTOR. ° ° °WHEN TO CALL US [phone]: °Poor pain control °Reactions / problems with new medications (rash/itching, nausea, etc)  °Fever over 101.5 F (38.5 C) °Worsening swelling or bruising °Continued bleeding from incision. °Increased pain, redness, or drainage from the incision ° ° The clinic staff is available to answer your questions during regular business hours (8:30am-5pm).  Please don’t hesitate to call and ask to speak to one of our nurses for clinical concerns.  ° If you have a medical emergency, go to the nearest emergency room or call 911. ° A surgeon from [facility] is always on call at the hospitals ° ° °[facility] °[address], [address], [place], [place]  [address] ? °MAIN: [phone] ? TOLL FREE: [phone] ?  °FAX [phone] °[email or web address] ° °

## 2016-08-01 NOTE — H&P (Signed)
Chad [name] 11/06/2015 1:35 PM Location: [facility] Patient #: [id number] DOB: 02/13/1971 Single / Language: Cleophus [name] / Race: White Male  Patient Care Team: Chad Knapp, MD as PCP - General (Family Medicine) Chad Boston, MD as Consulting Physician (General Surgery) Chad Records, MD as Consulting Physician (Cardiology) Chad Mayer, MD as Consulting Physician (Gastroenterology) Chad Frock, MD as Consulting Physician (Urology) Chad Ade, RN as Registered Nurse   History of Present Illness Chad Hector MD; 11/06/2015 2:03 PM) The patient is a 46 year old male who presents with colorectal cancer. Note for "Colorectal cancer": Patient returns status [name] robotically-assisted low anterior resection for rectosigmoid cancer. 10/18/2015 Lymph nodes: number examined 27; number positive: 1 Pathologic Staging: T2, N1, M0  Patient has unfortunately developed distant metastatic disease.  Felt to benefit from chemotherapy.  Request for Port-A-Cath placement made by her medical oncologist, Dr. Burr [name].  Patient returns today from resection earlier in the month. Went home postoperative day #5. Struggle with abdominal soreness & preferred IV medications. He's feeling much better now. Weaned off pain medication. Walking well. Transition to a more high fiber diet more recently. Moving his bowels about once a day. Occasionally has some urgency and feeling of incomplete evacuation bowel movements but not severe. Appetite coming back. No bleeding. No fevers chills or sweats. No problems with urination.      PATIENT: Chad [name] 46 y.o. male  Patient Care Team: Chad Knapp, MD as PCP - General (Family Medicine) Chad Boston, MD as Consulting Physician (General Surgery) Chad Records, MD as Consulting Physician (Cardiology) Chad Mayer, MD as Consulting Physician (Gastroenterology) Chad Frock, MD as Consulting Physician (Urology)  PRE-OPERATIVE DIAGNOSIS:  Sigmoid colon cancer  [name]-OPERATIVE DIAGNOSIS: Rectosigmoid colon cancer with urethral stricture  PROCEDURE:  XI ROBOT ASSISTED LOW ANTERIOR RESECTION RIGID PROCTOSCOPY WITH TATTOOING   SURGEON:   Chad Boston, MD  Diagnosis 1. Colon, segmental resection for tumor, rectosigmoid INFILTRATIVE MODERATELY DIFFERENTIATED ADENOCARCINOMA OF THE COLON (3.8 CM) THE TUMOR INVADES MUSCULARIS PROPRIA 1 of 4 FINAL for Chad [name] [phone]) Diagnosis(continued) MARGINS OF RESECTION ARE NEGATIVE FOR TUMOR METASTATIC COLONIC ADENOCARCINOMA IN ONE OF TWENTY-NINE LYMPH NODES (1/29) 2. Colon, resection margin (donut), final distal margin BENIGN COLONIC TISSUE, NEGATIVE FOR CARCINOMA Microscopic Comment 1. COLON AND RECTUM (INCLUDING TRANS-ANAL RESECTION): Specimen: Rectosigmoid colon Procedure: Segmental resection Tumor site: Rectal colon Specimen integrity: Intact Macroscopic intactness of mesorectum: Not applicable: NA Complete: NA Near complete: x Incomplete: NA Cannot be determined (specify): NA Macroscopic tumor perforation: Muscularis propria Invasive tumor: Maximum size: 3.8 cm Histologic type(s): Adenocarcinoma Histologic grade and differentiation: G2 G1: well differentiated/low grade G2: moderately differentiated/low grade G3: poorly differentiated/high grade G4: undifferentiated/high grade Type of polyp in which invasive carcinoma arose: Tubular adenoma Microscopic extension of invasive tumor:Muscularis propria Lymph-Vascular invasion: Negative Peri-neural invasion: Negative Tumor deposit(s) (discontinuous extramural extension): Negative Resection margins: Proximal margin: Negative Distal margin: Negative Circumferential (radial) (posterior ascending, posterior descending; lateral and posterior mid-rectum; and entire lower 1/3 rectum):Negative Mesenteric margin (sigmoid and transverse): Negative Distance closest margin (if all above margins negative):  4.8 cm Trans-anal resection margins only: Deep margin: NA Mucosal Margin: NA Distance closest mucosal margin (if negative): NA Treatment effect (neo-adjuvant therapy): NA Additional polyp(s): Hyperplastic poly Non-neoplastic findings: Unremarkable Lymph nodes: number examined 29; number positive: 1 Pathologic Staging: T2, N1, M0 Ancillary studies: Microsatellite instability (PCR) and mismatch repair protein (IHC) has been ordered. 2 of 4 FINAL for Chad [name] ([id number]) Microscopic Comment(continued)  Chad Lanius MD Pathologist, Electronic Signature (Case signed 10/22/2015) Specimen Chad [name] and Clinical Information Specimen(s) Obtained: 1. Colon, segmental resection for tumor, rectosigmoid 2. Colon, resection margin (donut), final distal margin Specimen Clinical Information 1. sigmoid colon cancer with urethral stricture (kp) Chad [name] 1. Specimen: Received fresh labeled rectosigmoid colon. Specimen integrity: Intact, with one opened and one stapled resection margin. Per the requistion, the open end is proximal. Specimen length: The specimen length is 20.0 cm Mesorectal intactness: The proximal two-thirds of the mesorectum appear complete, but are focally disrupted. The intact portion of the mesorectum is inked black overlying the possible lesion. Tumor location: Within the distal aspect of the specimen, on the mesenteric aspect. Tumor size: There is a 3.8 x 3.2 x 1.0 cm tan red, exophytic lesion with a centrally depressed area and rolled borders. Approximately 500 milligrams of fresh superficial tumor is submitted for research purposes. Percent of bowel circumference involved: Approximately 40%. Tumor distance to margins: Proximal: 12.1 cm. Distal: 4.8 cm Radial (posterior ascending, posterior descending; lateral and posterior mid-rectum; and entire lower 1/3 rectum): 6.4 cm to the intact aspect of the mesorectum. Macroscopic extent of tumor invasion: The tumor invades the  muscularis propria. Total presumed lymph nodes: Thirty tan pink to gray possible lymph nodes are identified, ranging from 0.1 cm to 0.9 cm in greatest dimension. Extramural satellite tumor nodules: None, identified. Mucosal polyp(s): Distal to the main tumor lesion there is a 0.3 cm in greatest dimension tan sessile polyp, which measures 2.0 cm from the distal resection margin. Additional findings: The uninvolved mucosa is tan pink with normal folding. There is blue gray discoloration present proximal and distal to the main tumor lesion, suggestive of tattoo powder. Block summary: A,B= proximal resection margin. C,D= distal resection margin. E= mucosal polyp. F-I= lesion. J= uninvolved mucosa. K,L= five possible lymph nodes, each. M-O= four possible lymph nodes, each. P= three possible lymph nodes. Q= two possible lymph nodes. R= one possible lymph node. S= one possible lymph node, bisected. T= one possible lymph node, bisected. 3 of 4 FINAL for SIDHARTH, LEVERETTE ([id number]) Ashur Glatfelter(continued) U= tissue for molecular studies. (KL:gt, 10/19/15) 2. Received in saline and consists of a circular portion of tan soft tissue, measuring 1.9 x 1.9 x cm in diameter and up to 1.1 cm in length. The exposed mucosa is tan and slightly granular. The specimen displays multiple embedded staples. Representative sections are submitted in one cassette. (KL:gt, 10/19/15) Stain(s) used in Diagnosis: The following stain(s) were used in diagnosing the case: MSH6, MLH1, MSH2, PMS2. The control(s) stained appropriately. Disclaimer Some of these immunohistochemical stains may have been developed and the performance characteristics determined by [facility]. Some may not have been cleared or approved by the U.S. Food and Drug Administration. The FDA has determined that such clearance or approval is not necessary. This test is used for clinical purposes. It should not be regarded as investigational or  for research. This laboratory is certified under the [place] (CLIA-88) as qualified to perform high complexity clinical laboratory testing. Report signed out from the following location(s) Technical component and interpretation was performed at [facility] [place], [place], [place] [address]. CLIA #: [id number], 4 of 4  1. Mismatch Repair (MMR) Protein Immunohistochemistry (IHC) IHC Expression Result: MLH1: Preserved nuclear expression (greater 50% tumor expression) MSH2: Preserved nuclear expression (greater 50% tumor expression) MSH6: Preserved nuclear expression (greater 50% tumor expression) PMS2: Preserved nuclear expression (greater 50% tumor expression) * Internal  control demonstrates intact nuclear expression Interpretation: NORMAL There is preserved expression of the major and minor MMR proteins. There is a very low probability that microsatellite instability (MSI) is present. However, certain clinically significant MMR protein mutations may result in preservation of nuclear expression. It is recommended that the preservation of protein expression be correlated with molecular based MSI testing. References: 1. Guidelines on Genetic Evaluation and Management of Lynch Syndrome: A Consensus Statement by the [place] Multi-Society Task Force on Colorectal Cancer Gae Dry. Sherlie Ban , MD, and others . Am Nicki [place] 2014; [id number]; doi: 10.1038/ajg.2014.186; published online 02 February 2013 2. Outcomes of screening endometrial cancer patients for Lynch syndrome by patient-administered checklist. Olena Heckle MS, and others. Gynecol Oncol 2013;131(3):619-623. Susanne Greenhouse MD Pathologist, Electronic Signat   Allergies [name], [place]; 11/06/2015 1:35 PM) No Known Drug Allergies 07/31/2015  Medication History [name], [place]; 11/06/2015 1:36 PM) Neomycin Sulfate (500MG Tablet, 2 (two) Tablet Oral SEE NOTE, Taken  starting 07/31/2015) Active. (TAKE TWO TABLETS AT 2 PM, 3 PM, AND 10 PM THE DAY PRIOR TO SURGERY) Flagyl (500MG Tablet, 2 (two) Tablet Oral SEE NOTE, Taken starting 07/31/2015) Active. (Take at 2pm, 3pm, and 10pm the day prior to your colon operation) Carvedilol (6.25MG Tablet, Oral) Active. Dicyclomine HCl (10MG Capsule, Oral) Active. GlipiZIDE ER (5MG Tablet ER 24HR, Oral) Active. Lisinopril-Hydrochlorothiazide (20-25MG Tablet, Oral) Active. LORazepam (1MG Tablet, Oral) Active. Omeprazole (40MG Capsule DR, Oral) Active. Ascorbic Acid (1000MG Tablet, Oral) Active. Aspirin (81MG Tablet, Oral) Active. OxyCODONE HCl (5MG Tablet, Oral) Active. MetFORMIN HCl ER (500MG Tablet ER 24HR, Oral) Active. Sucralfate (1GM Tablet, Oral) Active. Medications Reconciled  Vitals [name] CMA; 11/06/2015 1:37 PM) 11/06/2015 1:36 PM Weight: 400 lb Height: 75in Body Surface Area: 2.95 m Body Mass Index: 50 kg/m  Temp.: 98.40F  Pulse: 114 (Regular)  BP: 140/82 (Sitting, Left Arm, Standard)     BP (!) 167/75   Pulse 79   Temp 98.9 F (37.2 C) (Oral)   Resp 20   Wt (!) 179.6 kg (396 lb)   SpO2 100%   BMI 49.50 kg/m    Physical Exam  General Mental Status-Alert. General Appearance-Not in acute distress. Voice-Normal. Note: Relaxed. Nontoxic.   Integumentary Global Assessment Upon inspection and palpation of skin surfaces of the - Distribution of scalp and body hair is normal. General Characteristics Overall examination of the patient's skin reveals - no rashes and no suspicious lesions.  Head and Neck Head-normocephalic, atraumatic with no lesions or palpable masses. Face Global Assessment - atraumatic, no absence of expression. Neck Global Assessment - no abnormal movements, no decreased range of motion. Trachea-midline. Thyroid Gland Characteristics - non-tender.  Eye Eyeball - Left-Extraocular movements intact, No Nystagmus. Eyeball -  Right-Extraocular movements intact, No Nystagmus. Upper Eyelid - Left-No Cyanotic. Upper Eyelid - Right-No Cyanotic.  Chest and Lung Exam Inspection Accessory muscles - No use of accessory muscles in breathing.  Abdomen Note: Incisions closed. No cellulitis. No guarding/rebound tenderness   Male Genitourinary Note: No inguinal hernias. Normal external genitalia. Epididymi, testes, and spermatic cords normal without any masses.   Peripheral Vascular Upper Extremity Inspection - Left - Not Gangrenous, No Petechiae. Right - Not Gangrenous, No Petechiae.  Neurologic Neurologic evaluation reveals -normal attention span and ability to concentrate, able to name objects and repeat phrases. Appropriate fund of knowledge and normal coordination.  Neuropsychiatric Mental status exam performed with findings of-able to articulate well with normal speech/language, rate, volume and coherence and no evidence of hallucinations, delusions, obsessions or homicidal/suicidal  ideation. Orientation-oriented X3.  Musculoskeletal Global Assessment Gait and Station - normal gait and station.  Lymphatic General Lymphatics Description - No Generalized lymphadenopathy.    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  Placing Portacath  Use of a central venous catheter for intravenous therapy was discussed.  Technique of catheter placement using ultrasound and fluoroscopy guidance was discussed.  Risks such as bleeding, infection, injury to other organs, need for repair of tissues / organs, need for further treatment, pneumothorax, catheter occlusion, reoperation, and other risks were discussed.   I noted a good likelihood this will help address the problem.  Questions were answered.  The patient expressed understanding & wishes to proceed.   Chad [name], M.D., F.A.C.S. Gastrointestinal and Minimally Invasive  Surgery [facility] [address], [place], [place] [address] [phone] Main / Paging

## 2016-08-01 NOTE — Anesthesia Preprocedure Evaluation (Addendum)
Anesthesia Evaluation  Patient identified by MRN, date of birth, ID band Patient awake    Reviewed: Allergy & Precautions, NPO status , Patient's Chart, lab work & pertinent test results  Airway Mallampati: II  TM Distance: >3 FB Neck ROM: Full    Dental  (+) Teeth Intact, Dental Advisory Given, Poor Dentition   Pulmonary    breath sounds clear to auscultation       Cardiovascular hypertension,  Rhythm:Regular Rate:Normal     Neuro/Psych    GI/Hepatic   Endo/Other  diabetes  Renal/GU      Musculoskeletal   Abdominal (+) + obese,   Peds  Hematology   Anesthesia Other Findings   Reproductive/Obstetrics                            Anesthesia Physical Anesthesia Plan  ASA: III  Anesthesia Plan: General   Post-op Pain Management:    Induction: Intravenous  Airway Management Planned: Oral ETT  Additional Equipment:   Intra-op Plan:   Post-operative Plan: Extubation in OR  Informed Consent: I have reviewed the patients History and Physical, chart, labs and discussed the procedure including the risks, benefits and alternatives for the proposed anesthesia with the patient or authorized representative who has indicated his/her understanding and acceptance.   Dental advisory given  Plan Discussed with: CRNA and Anesthesiologist  Anesthesia Plan Comments:        Anesthesia Quick Evaluation

## 2016-08-01 NOTE — Anesthesia Procedure Notes (Signed)
Procedure Name: Intubation Date/Time: 08/01/2016 12:20 PM Performed by: Ollen Bowl Pre-anesthesia Checklist: Patient identified, Emergency Drugs available, Suction available, Patient being monitored and Timeout performed Patient Re-evaluated:Patient Re-evaluated prior to inductionOxygen Delivery Method: Circle system utilized and Simple face mask Preoxygenation: Pre-oxygenation with 100% oxygen Intubation Type: IV induction Ventilation: Mask ventilation without difficulty Laryngoscope Size: Miller and 3 Grade View: Grade I Tube type: Oral Tube size: 7.5 mm Airway Equipment and Method: Patient positioned with wedge pillow and Stylet Placement Confirmation: ETT inserted through vocal cords under direct vision,  positive ETCO2 and breath sounds checked- equal and bilateral Secured at: 23 cm Tube secured with: Tape Dental Injury: Teeth and Oropharynx as per pre-operative assessment

## 2016-08-02 ENCOUNTER — Encounter (HOSPITAL_COMMUNITY): Payer: Self-pay | Admitting: Surgery

## 2016-08-02 NOTE — Anesthesia Postprocedure Evaluation (Signed)
Anesthesia Post Note  Patient: Chad [name]  Procedure(s) Performed: Procedure(s) (LRB): INSERTION PORT-A-CATH WITH ULTRASOUND AND FLUOROSOPIC GUIDANCE - RIGHT INTERNAL JUGULAR (Right)  Patient location during evaluation: PACU Anesthesia Type: General Level of consciousness: awake, awake and alert and oriented Pain management: pain level controlled Vital Signs Assessment: post-procedure vital signs reviewed and stable Respiratory status: spontaneous breathing, nonlabored ventilation and respiratory function stable Cardiovascular status: blood pressure returned to baseline Postop Assessment: no headache Anesthetic complications: no       Last Vitals:  Vitals:   08/01/16 1445 08/01/16 1518  BP:  (!) 142/83  Pulse: 70 77  Resp: 16   Temp:      Last Pain:  Vitals:   08/01/16 1518  TempSrc:   PainSc: 0-No pain                 Deen Deguia COKER

## 2016-08-06 NOTE — Progress Notes (Signed)
[place]  [phone] [phone]  Clinic Follow up Note   Patient Care Team: Shawnee Knapp, MD as PCP - General (Family Medicine) Michael Boston, MD as Consulting Physician (General Surgery) Fay Records, MD as Consulting Physician (Cardiology) Gatha Mayer, MD as Consulting Physician (Gastroenterology) Alexis Frock, MD as Consulting Physician (Urology) Tania Ade, RN as Registered Nurse Truitt Merle, MD as Consulting Physician (Medical Oncology) 08/08/2016  CHIEF COMPLAINTS: Follow up colon cancer   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   Staging form: Colon and Rectum, AJCC 7th Edition     Pathologic stage from 10/18/2015: Stage IIIA (T2, N1a, cM0) - Signed by Truitt Merle, MD on 11/15/2015       Cancer of left colon ([place])   05/11/2015 Imaging    CT ABD/PELVIS: Gallstones w/sludge;liver prominent without focal liver lesion, mild thickening of bladder wall; negative for cancer      05/18/2015 Imaging    [place] ABD: Negative      07/25/2015 Initial Diagnosis    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 8 mm descending polyp and 5 mm rectosigmoid polyp;18-29 mm proximal rectal polyp      07/25/2015 Tumor Marker    CEA=0.7      07/26/2015 Pathology Results    Sigmoid mass: invasive colorectal adenocarcinoma      10/22/2015 Pathologic Stage    T2, N1, MO    #29 nodes examined w/ 1 postive node; moderately differentiated; Negative for Lymph-vascular and Peri-neural invasion; Microscopic extension into muscularis propria      10/26/2015 Pathology Results    MSI Stable      11/30/2015 - 02/08/2016 Chemotherapy    Adjuvant chemotherapy with Capecitabine 2500 mg twice daily, on  day 1-14, oxaliplatin 130 mg/m, every 21 days      05/01/2016 Imaging    Surveillance CT CAP scan showed new more focal low-density lesions in the liver, possible metastasis. No other new lesions.      06/05/2016 Imaging    MR ABDOMEN W WO CONTRAST IMPRESSION: 1. 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 2. No additional metastatic disease in the upper abdomen. 3. Cholelithiasis.      06/24/2016 Pathology Results    US Biopsy Diagnosis Liver, needle/core biopsy, right lobe - METASTATIC ADENOCARCINOMA, CONSISTENT WITH COLORECTAL PRIMARY.      07/16/2016 Miscellaneous    Foundation One results received       HISTORY OF PRESENTING ILLNESS:  Chad [name] 46 y.o. male is here because of recently diagnosed colon cancer.  He is accompanied by his friend to the clinic today.  He presented with abdominal pain since Oct 2016,  Was seen in the emergency room on 05/11/2015,  CT abdomen and pelvis showed  Mild thickening of the urinary bladder wall, gallstones, otherwise negative. He was seen by her primary care physician Dr. Brigitte Pulse afterwards, who referred him to GI Dr. Carlean Purl.  His father passed away from [place] cancer around that time,  So his appointment was postponed, and he finally had colonoscopy in early January 2017, which showed multiple polyps, and  A ulcerated mass in distal sigmoid colon.  He was subsequently referred to colorectal surgeon Dr. Johney Maine, and  Eventually had  left colectomy  On 11/01/2015.  He tolerated surgery well, and recovered well. He has good appetite good, BM is normal most of time, occasional diarrhea, likely second to food, mild low abdomen pain, which has improves, he lost about 30 lbs before surgery and 10lbs after surgery.   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  He is divorced, he has a 19 year old daughter who lives with her mother in [place]. He has sisters and brothers, and  her mother who live close by.   CURRENT THERAPY:  chemo CAPOX, Every 3 weeks, started on 07/18/2016  INTERIM HISTORY: Chad [name] returns for follow-up and 6th cycle chemotherapy. His port was put in a week ago. He still has some tenderness at the incisional site. He said he did about the same after this cycle of chemo. He has cold sensitivity and nausea. He has been taking Zofran and Compazine, which helped. Zofran helped him more than Compazine. He also has some diarrhea that lasts for a couple of days after chemo. He uses Imodium to relieve this. His appetite comes and goes for the first 10-12 days. He has been drinking a lot of water. Denies fever, chills, or bleeding. He has lost 8 lbs in 3 weeks. He has some abdominal pain that feels like stabbing for a few seconds happens occasionally. He has some tingling in his hands for 10 days after chemo. Denies vomiting. Denies any other complaints.   MEDICAL HISTORY:  Past Medical History:  Diagnosis Date  . Cancer [facility])    colon cancer  . Coronary artery disease   . Diabetes mellitus without complication ([place]) 10/979   oral agents only (07/2015)  . ETOH abuse 2014  . Fatty liver 12/2014   on ultrasound. pt obese and abuses ETOH  . Gallstone 12/2014   GB stones and sludge on ultrasound and CT,  . GERD (gastroesophageal reflux disease)   . Hypertension   . Morbid obesity with BMI of 50.0-59.9, adult ([place]) 03/2013  . Rectal bleeding 05/2015   07/2015 colonoscopy with malignant appearing sigmoid mass. Additional rectal, descending, polyps  . Shoulder pain, bilateral    due to previous injury    SURGICAL HISTORY: Past Surgical History:  Procedure Laterality Date  . CARDIAC CATHETERIZATION N/A 04/06/2015   Procedure: Left Heart Cath and Coronary Angiography;  Surgeon: Belva Crome, MD;  Location: [facility];  Service: Cardiovascular;  Laterality: N/A;  . COLONOSCOPY N/A 07/25/2015   Procedure: COLONOSCOPY;  Surgeon: Gatha Mayer, MD;   Location: [facility];  Service: Gastroenterology;  Laterality: N/A;  . CYSTOSCOPY  10/18/2015   Procedure: CYSTOSCOPY FLEXIBLE URETHRAL DIALATION AND FOLEY PLACEMENT;  Surgeon: Alexis Frock, MD;  Location: [facility];  Service: Urology;;  . ESOPHAGOGASTRODUODENOSCOPY N/A 07/25/2015   Procedure: ESOPHAGOGASTRODUODENOSCOPY (EGD);  Surgeon: Gatha Mayer, MD;  Location: [facility];  Service: Gastroenterology;  Laterality: N/A;  . PORTACATH PLACEMENT Right 08/01/2016   Procedure: INSERTION PORT-A-CATH WITH ULTRASOUND AND FLUOROSOPIC GUIDANCE - RIGHT INTERNAL JUGULAR;  Surgeon: Michael Boston, MD;  Location: [facility];  Service: General;  Laterality: Right;    SOCIAL HISTORY: Social History   Social History  . Marital status: Legally Separated    Spouse name: N/A  . Number of children: 1  . Years of education: N/A   Occupational History  . Manager    Social History Main Topics  . Smoking status: Never Smoker  . Smokeless tobacco: Current  User    Types: Chew     Comment: occasionally dips tobacco - has used for approx 20 yrs (12/21/15)  . Alcohol use Yes     Comment: heavy drinking for 10 years, 2-3  drinks of liqour a night, has been cutting back.    . Drug use: No  . Sexual activity: Not on file   Other Topics Concern  . Not on file   Social History Narrative   Divorced, has 16 yo daughter lives in [place]   Lives alone   Manages chain of car wash dealers       FAMILY HISTORY: Family History  Problem Relation Age of Onset  . Diabetes Father   . Pancreatic cancer Father 48    former smoker, but had not smoked in 77 years  . Cancer Maternal Uncle     d. 60s; unknown type of cancer -metastatic throughout abdomen   . Other Brother     oldest brother has had a normal colonoscopy  . Heart attack Maternal Grandfather     d. 57-58; heavy smoker and drinker  . Heart attack Paternal Grandmother     d. early 19s  . Heart attack Paternal Grandfather 48  . Heart failure Maternal Uncle   .  Skin cancer Maternal Uncle 42    NOS type; was out in the sun a lot  . Brain cancer Maternal Uncle     NOS type; mother's maternal half-brother dx. 42-68  . Cirrhosis Paternal Uncle   . Cancer Paternal Uncle     (x2-3) uncles with NOS cancers; d. 38s    ALLERGIES:  has No Known Allergies.  MEDICATIONS:  Current Outpatient Prescriptions  Medication Sig Dispense Refill  . acetaminophen (TYLENOL) 500 MG tablet Take 1,000 mg by mouth every 6 (six) hours as needed for moderate pain or headache.    . Ascorbic Acid (VITAMIN C) 1000 MG tablet Take 1,000 mg by mouth daily.    Marland Kitchen aspirin EC 81 MG tablet Take 1 tablet (81 mg total) by mouth daily. DO NOT TAKE AGAIN UNTIL 08/09/15    . capecitabine (XELODA) 500 MG tablet Take 5 tablets (2,500 mg total) by mouth 2 (two) times daily after a meal. 140 tablet 2  . carvedilol (COREG) 6.25 MG tablet Take 1 tablet (6.25 mg total) by mouth 2 (two) times daily with a meal. (Patient taking differently: Take 6.25 mg by mouth 2 (two) times daily. ) 180 tablet 1  . gabapentin (NEURONTIN) 300 MG capsule Take 1 capsule (300 mg total) by mouth at bedtime. 90 capsule 1  . glipiZIDE (GLUCOTROL XL) 5 MG 24 hr tablet Take 1 tablet (5 mg total) by mouth daily with breakfast. 90 tablet 0  . lisinopril-hydrochlorothiazide (PRINZIDE,ZESTORETIC) 20-25 MG tablet TAKE 1 TABLET BY MOUTH DAILY 90 tablet 0  . metFORMIN (GLUCOPHAGE-XR) 500 MG 24 hr tablet TAKE 4 TABLETS BY MOUTH EVERY DAY WITH BREAKFAST 120 tablet 0  . omeprazole (PRILOSEC) 40 MG capsule TAKE 1 CAPSULE(40 MG) BY MOUTH DAILY 90 capsule 0  . ondansetron (ZOFRAN) 8 MG tablet Take 1 tablet (8 mg total) by mouth every 8 (eight) hours as needed for nausea or vomiting. 30 tablet 0  . prochlorperazine (COMPAZINE) 10 MG tablet Take 1 tablet (10 mg total) by mouth every 6 (six) hours as needed for nausea or vomiting. 30 tablet 2  . LORazepam (ATIVAN) 1 MG tablet TAKE 1 TABLET BY MOUTH AT BEDTIME AS NEEDED FOR ANXIETY 30 tablet  0  No current facility-administered medications for this visit.     REVIEW OF SYSTEMS:   Constitutional: Denies fevers, chills or abnormal night sweats (+) cold sensitivity, appetite loss, weight loss (8 lbs).  Eyes: Denies blurriness of vision, double vision or watery eyes Ears, nose, mouth, throat, and face: Denies mucositis or sore throat Respiratory: Denies cough, dyspnea or wheezes Cardiovascular: Denies palpitation, chest discomfort or lower extremity swelling Gastrointestinal:  Denies heartburn or change in bowel habits (+) nausea, diarrhea, abdominal tenderness, abdominal pain.  Skin: Denies abnormal skin rashes Lymphatics: Denies new lymphadenopathy or easy bruising Neurological:Denies numbness, or new weaknesses (+) tingling hands Behavioral/Psych: Mood is stable, no new changes  All other systems were reviewed with the patient and are negative.  PHYSICAL EXAMINATION: ECOG PERFORMANCE STATUS: 1  Vitals:   08/08/16 1239  BP: (!) 154/84  Pulse: 89  Resp: 18  Temp: 98.6 F (37 C)   Filed Weights   08/08/16 1239  Weight: (!) 388 lb (176 kg)   GENERAL:alert,  Morbidly obese. SKIN: skin color, texture, turgor are normal, no rashes or significant lesions EYES: normal, conjunctiva are pink and non-injected, sclera clear OROPHARYNX:no exudate, no erythema and lips, buccal mucosa, and tongue normal  NECK: supple, thyroid normal size, non-tender, without nodularity LYMPH:  no palpable lymphadenopathy in the cervical, axillary or inguinal LUNGS: clear to auscultation and percussion with normal breathing effort HEART: regular rate & rhythm and no murmurs and no lower extremity edema ABDOMEN:abdomen soft,  Surgical incision and laparoscopic scars are well-healed. non-tender and normal bowel sounds Musculoskeletal:no cyanosis of digits and no clubbing  PSYCH: alert & oriented x 3 with fluent speech NEURO: no focal motor/sensory deficits  LABORATORY DATA:  I have reviewed  the data as listed CBC Latest Ref Rng & Units 08/08/2016 08/08/2016 07/18/2016  WBC 4.0 - 10.5 K/uL 10.3 9.7 9.3  Hemoglobin 13.0 - 17.0 g/dL 14.6 13.9 13.8  Hematocrit 39.0 - 52.0 % 42.2 40.5 40.6  Platelets 150 - 400 K/uL 227 238 352    CMP Latest Ref Rng & Units 08/08/2016 08/08/2016 07/18/2016  Glucose 65 - 99 mg/dL 334(H) 270(H) 360(H)  BUN 6 - 20 mg/dL 22(H) 18.5 11.4  Creatinine 0.61 - 1.24 mg/dL 0.96 1.0 0.9  Sodium 135 - 145 mmol/L 132(L) 135(L) 135(L)  Potassium 3.5 - 5.1 mmol/L 4.1 4.1 4.4  Chloride 101 - 111 mmol/L 99(L) - -  CO2 22 - 32 mmol/L 20(L) 23 22  Calcium 8.9 - 10.3 mg/dL 9.0 9.5 9.9  Total Protein 6.5 - 8.1 g/dL 7.8 7.8 7.9  Total Bilirubin 0.3 - 1.2 mg/dL 1.3(H) 0.57 0.65  Alkaline Phos 38 - 126 U/L 75 72 75  AST 15 - 41 U/L 52(H) 24 16  ALT 17 - 63 U/L '29 21 17   ' CEA:  07/25/2015: 0.7 11/30/2015: 4.5 05/01/2016: 57.89 (64.7)  07/18/2016: 329.87 (310.0)  PATHOLOGY REPORT: US Biopsy 06/24/2016 Diagnosis Liver, needle/core biopsy, right lobe - METASTATIC ADENOCARCINOMA, CONSISTENT WITH COLORECTAL PRIMARY. - SEE COMMENT. Microscopic Comment Dr. Burr Medico was paged on 06/25/16. Per request, a block will be sent for Foundation 1 testing and the results reported separately. (JBK:gt, 06/25/16)   Diagnosis 10/18/2015 1. Colon, segmental resection for tumor, rectosigmoid INFILTRATIVE MODERATELY DIFFERENTIATED ADENOCARCINOMA OF THE COLON (3.8 CM) THE TUMOR INVADES MUSCULARIS PROPRIA MARGINS OF RESECTION ARE NEGATIVE FOR TUMOR METASTATIC COLONIC ADENOCARCINOMA IN ONE OF TWENTY-NINE LYMPH NODES (1/29) 2. Colon, resection margin (donut), final distal margin BENIGN COLONIC TISSUE, NEGATIVE FOR CARCINOMA Microscopic Comment 1. COLON  AND RECTUM (INCLUDING TRANS-ANAL RESECTION): Specimen: Rectosigmoid colon Procedure: Segmental resection Tumor site: Rectal colon Specimen integrity: Intact Macroscopic intactness of mesorectum: Not applicable: NA Complete: NA Near complete:  x Incomplete: NA Cannot be determined (specify): NA Macroscopic tumor perforation: Muscularis propria Invasive tumor: Maximum size: 3.8 cm Histologic type(s): Adenocarcinoma Histologic grade and differentiation: G2 G1: well differentiated/low grade G2: moderately differentiated/low grade G3: poorly differentiated/high grade G4: undifferentiated/high grade Type of polyp in which invasive carcinoma arose: Tubular adenoma Microscopic extension of invasive tumor:Muscularis propria Lymph-Vascular invasion: Negative Peri-neural invasion: Negative Tumor deposit(s) (discontinuous extramural extension): Negative Resection margins: Proximal margin: Negative Distal margin: Negative Circumferential (radial) (posterior ascending, posterior descending; lateral and posterior mid-rectum; and entire lower 1/3 rectum):Negative Mesenteric margin (sigmoid and transverse): Negative Distance closest margin (if all above margins negative): 4.8 cm Trans-anal resection margins only: Deep margin: NA Mucosal Margin: NA Distance closest mucosal margin (if negative): NA Treatment effect (neo-adjuvant therapy): NA Additional polyp(s): Hyperplastic poly Non-neoplastic findings: Unremarkable Lymph nodes: number examined 29; number positive: 1 Pathologic Staging: T2, N1, M0   Mismatch Repair (MMR) Protein Immunohistochemistry (IHC) IHC Expression Result: MLH1: Preserved nuclear expression (greater 50% tumor expression) MSH2: Preserved nuclear expression (greater 50% tumor expression) MSH6: Preserved nuclear expression (greater 50% tumor expression) PMS2: Preserved nuclear expression (greater 50% tumor expression) * Internal control demonstrates intact nuclear expression Interpretation: NORMAL     Diagnosis 07/25/2015 1. Colon, polyp(s), descending - TUBULAR ADENOMA. NO HIGH GRADE DYSPLASIA OR MALIGNANCY IDENTIFIED. 2. Colon, biopsy, distal sigmoid mass - INVASIVE COLORECTAL ADENOCARCINOMA. 3.  Colon, polyp(s), small recto sigmoid - HYPERPLASTIC POLYP. NO ADENOMATOUS CHANGE OR MALIGNANCY. 4. Rectum, polyp(s) - TUBULOVILLOUS ADENOMA. NO HIGH GRADE DYSPLASIA OR MALIGNANCY IDENTIFIED.   RADIOGRAPHIC STUDIES: I have personally reviewed the radiological images as listed and agreed with the findings in the report.  CT chest, abdomen and pelvis w contrast 05/01/2016  IMPRESSION: 1. Stable chest CT.  No evidence of thoracic metastatic disease. 2. Progressive heterogeneity of the liver could be due to heterogeneous steatosis. However, there are new more focal low-density lesions which are worrisome for possible metastatic disease given the patient's history. Further evaluation with hepatic MRI recommended. 3. Resolving postsurgical retroperitoneal fluid collections status post partial colon resection and anastomosis.  Abdominal MRI w wo contrast 06/05/2016 IMPRESSION: 1. 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 2. No additional metastatic disease in the upper abdomen. 3. Cholelithiasis.   COLONOSCOPY 07/25/2015 ENDOSCOPIC IMPRESSION: 1) 1/4 circumference firm ulcerated mass in distal sigmmoid - approx 22 cm from verge. Looks like cancer. Biopsied. 2) 8 mm descending polyp and 5 mm rectosigmoid polyp removed cold snare, completely recovered and sent to path. 3) 18-20 mm pedunculated proximal rectal polyp removed hot snare and completely recovered for pathology. 4) Otherwise normal colonoscopy RECOMMENDATIONS: 1. Hold Aspirin and all other NSAIDS for 2 weeks. 2. Will call pathology results and plans though anticipate surgical referral after pathology in. CEA will be drawn at hospital today. He has had recent CT's of chest, and abd/pelvis - will see if he needs new ones - he may.  EGD 07/25/2015 ENDOSCOPIC IMPRESSION: Normal appearing esophagus and GE junction, the stomach was well visualized and normal in appearance, normal  appearing duodenum  ASSESSMENT & PLAN:  46 y.o.  Caucasian male, with past medical history of heavy alcohol drinking, hypertension, diabetes, coronary artery disease, presented with abdominal pain.  1.  Cancer of left colon, Distal sigmoid, invasive adenocarcinoma, G2, pT2N1aM0, stage IIIA, MSI-stable, liver metastasis in 04/2016 -I initially  reviewed his CT scan findings, and the surgical pathology findings include details with patient. -he has locally advanced sigmoid colon cancer, with one out of 29 lymph nodes positive. -We previously discussed the risk of cancer recurrence after's complete surgical resection. Giving his stage IIIa disease, he is at moderate to high risk for recurrence. -He has completed 3 months of adjuvant chemotherapy CAPOX, tolerated well. -He unfortunately developed metastatic disease right after he completes adjuvant chemotherapy. MRI confirmed at least a 6 metastatic lesions in the liver, largest 4.2 cm. Giving the multiple liver metastasis, this is likely incurable metastatic cancer. -He underwent ultrasound-guided liver mass biopsy to confirm metastasis, which revealed metastatic adenocarcinoma consistent with a colorectal primary.  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 He is not a candidate for immunotherapy, and would not benefit from EGFR inhibitor. -I recommend adding Avastin to his chemotherapy regiment. Potential side effects, which includes but not limited to, hypertension, proteinuria, hemorrhage and thrombosis, bowel perforation, etc., were discussed with patient in details. He agrees to proceed.  -lab reviewed, adequate for treatment, will proceed chemotherapy CAPOX and Avastin today  -I previously reviewed his Compazine and Zofran -will repeat staging CT scan after 3-4 cycles chemo   2. Genetics -per NCCN guideline, due to his young age and family history of pancreatic cancer, he is qualified for genetic testing to ruled out  inheritable genetic syndromes. -His genetic testing was negative. APC VUS called [id number] found on the colorectal cancer panel.  The Colorectal Cancer Panel offered by [facility] includes sequencing and/or duplication/deletion testing of the following 19 genes: APC, ATM, AXIN2, BMPR1A, CDH1, CHEK2, EPCAM, MLH1, MSH2, MSH6, MUTYH, PMS2, POLD1, POLE, PTEN, SCG5/GREM1, SMAD4, STK11, and TP53.  3. HTN, DM, CAD, morbid obesity -he will continue follow-up with his primary care physician -We will monitor his blood pressure, glucose closely during the chemotherapy, and may need to adjust his the medication if needed. -I strongly encouraged him to see Dr. Brigitte Pulse to adjust his diabetic medication due to his uncontrolled hyperglycemia and hypertension --his BG again is very high, 360 today, he will refill his glipizide, and monitor his BG at home. I will give 8u insulin before lunch at infusion room today  -We previously discussed his premedication dexamethasone will increase his blood sugar, diabetic diet discussed again.  4. History of alcohol abuse -He has stopped alcohol completely since he started chemotherapy.   5. 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  -Near resolved now  6. Coping and social issues  -The patient is having a hard time with his diagnosis.  -I offered social work to help whenever he is in need of it. He declined for now   7. Hyperglycemia  -His blood glucose is high today, 270. He hasn't eaten since 7:30 this morning.  -He checks at home. In the mornings it is high. Better in the night. -Encouraged him to eat healthy. He has stopped eating fast food and ice cream.  -he will have lunch at infusion room, and I will give 6u insulin today   Plan -Lab reviewed, we'll proceed cycle 2 Xeloda, oxaliplatin today, and adding Avastin  -lab, f/u and chemo in 3 weeks -after 3 cycles, repeat CT.   -will give 6u insulin before lunch during chemo today   All  questions were answered. The patient knows to call the clinic with any problems, questions or concerns.  I spent 20 minutes counseling the patient face to face. The total time spent in the appointment  was 25 minutes and more than 50% was on counseling.   This document serves as a record of services personally performed by Truitt Merle, MD. It was created on her behalf by Martinique Casey, a trained medical scribe. The creation of this record is based on the scribe's personal observations and the provider's statements to them. This document has been checked and approved by the attending provider.  I have reviewed the above documentation for accuracy and completeness, and I agree with the above information.   Truitt Merle, MD 08/08/2016

## 2016-08-08 ENCOUNTER — Emergency Department (HOSPITAL_COMMUNITY)
Admission: EM | Admit: 2016-08-08 | Discharge: 2016-08-08 | Discharge status: Against Medical Advice | Payer: BLUE CROSS/BLUE SHIELD | Attending: Emergency Medicine | Admitting: Emergency Medicine

## 2016-08-08 ENCOUNTER — Ambulatory Visit (HOSPITAL_BASED_OUTPATIENT_CLINIC_OR_DEPARTMENT_OTHER): Payer: BLUE CROSS/BLUE SHIELD | Admitting: Hematology

## 2016-08-08 ENCOUNTER — Telehealth: Payer: Self-pay | Admitting: Hematology

## 2016-08-08 ENCOUNTER — Ambulatory Visit: Payer: BLUE CROSS/BLUE SHIELD | Admitting: Nurse Practitioner

## 2016-08-08 ENCOUNTER — Encounter: Payer: Self-pay | Admitting: Nurse Practitioner

## 2016-08-08 ENCOUNTER — Ambulatory Visit (HOSPITAL_BASED_OUTPATIENT_CLINIC_OR_DEPARTMENT_OTHER): Payer: BLUE CROSS/BLUE SHIELD

## 2016-08-08 ENCOUNTER — Other Ambulatory Visit (HOSPITAL_BASED_OUTPATIENT_CLINIC_OR_DEPARTMENT_OTHER): Payer: BLUE CROSS/BLUE SHIELD

## 2016-08-08 ENCOUNTER — Encounter (HOSPITAL_COMMUNITY): Payer: Self-pay

## 2016-08-08 VITALS — BP 154/84 | HR 89 | Temp 98.6°F | Resp 18 | Ht 75.0 in | Wt 388.0 lb

## 2016-08-08 VITALS — BP 158/80 | HR 125 | Temp 98.6°F | Resp 23

## 2016-08-08 DIAGNOSIS — C186 Malignant neoplasm of descending colon: Secondary | ICD-10-CM

## 2016-08-08 DIAGNOSIS — Z5112 Encounter for antineoplastic immunotherapy: Secondary | ICD-10-CM | POA: Diagnosis not present

## 2016-08-08 DIAGNOSIS — I2510 Atherosclerotic heart disease of native coronary artery without angina pectoris: Secondary | ICD-10-CM | POA: Insufficient documentation

## 2016-08-08 DIAGNOSIS — G620 Drug-induced polyneuropathy: Secondary | ICD-10-CM

## 2016-08-08 DIAGNOSIS — C787 Secondary malignant neoplasm of liver and intrahepatic bile duct: Secondary | ICD-10-CM

## 2016-08-08 DIAGNOSIS — E1140 Type 2 diabetes mellitus with diabetic neuropathy, unspecified: Secondary | ICD-10-CM | POA: Diagnosis not present

## 2016-08-08 DIAGNOSIS — C189 Malignant neoplasm of colon, unspecified: Secondary | ICD-10-CM

## 2016-08-08 DIAGNOSIS — T451X5A Adverse effect of antineoplastic and immunosuppressive drugs, initial encounter: Secondary | ICD-10-CM | POA: Insufficient documentation

## 2016-08-08 DIAGNOSIS — I5022 Chronic systolic (congestive) heart failure: Secondary | ICD-10-CM | POA: Insufficient documentation

## 2016-08-08 DIAGNOSIS — C19 Malignant neoplasm of rectosigmoid junction: Secondary | ICD-10-CM

## 2016-08-08 DIAGNOSIS — T7840XA Allergy, unspecified, initial encounter: Secondary | ICD-10-CM

## 2016-08-08 DIAGNOSIS — Z5111 Encounter for antineoplastic chemotherapy: Secondary | ICD-10-CM | POA: Diagnosis not present

## 2016-08-08 DIAGNOSIS — E1165 Type 2 diabetes mellitus with hyperglycemia: Secondary | ICD-10-CM | POA: Diagnosis not present

## 2016-08-08 DIAGNOSIS — Z9221 Personal history of antineoplastic chemotherapy: Secondary | ICD-10-CM

## 2016-08-08 DIAGNOSIS — R197 Diarrhea, unspecified: Secondary | ICD-10-CM | POA: Insufficient documentation

## 2016-08-08 DIAGNOSIS — Z7982 Long term (current) use of aspirin: Secondary | ICD-10-CM | POA: Diagnosis not present

## 2016-08-08 DIAGNOSIS — I110 Hypertensive heart disease with heart failure: Secondary | ICD-10-CM | POA: Insufficient documentation

## 2016-08-08 DIAGNOSIS — Z7984 Long term (current) use of oral hypoglycemic drugs: Secondary | ICD-10-CM | POA: Diagnosis not present

## 2016-08-08 DIAGNOSIS — R112 Nausea with vomiting, unspecified: Secondary | ICD-10-CM

## 2016-08-08 DIAGNOSIS — I10 Essential (primary) hypertension: Secondary | ICD-10-CM

## 2016-08-08 DIAGNOSIS — F1021 Alcohol dependence, in remission: Secondary | ICD-10-CM

## 2016-08-08 LAB — CBC WITH DIFFERENTIAL/PLATELET
BASO%: 0.2 % (ref 0.0–2.0)
Basophils Absolute: 0 10*3/uL (ref 0.0–0.1)
Basophils Absolute: 0 10*3/uL (ref 0.0–0.1)
Basophils Relative: 0 %
EOS%: 2.9 % (ref 0.0–7.0)
Eosinophils Absolute: 0.3 10*3/uL (ref 0.0–0.5)
Eosinophils Absolute: 0 10*3/uL (ref 0.0–0.7)
Eosinophils Relative: 0 %
HCT: 40.5 % (ref 38.4–49.9)
HCT: 42.2 % (ref 39.0–52.0)
HEMOGLOBIN: 13.9 g/dL (ref 13.0–17.1)
Hemoglobin: 14.6 g/dL (ref 13.0–17.0)
LYMPH%: 29.2 % (ref 14.0–49.0)
Lymphocytes Relative: 2 %
Lymphs Abs: 0.2 10*3/uL — ABNORMAL LOW (ref 0.7–4.0)
MCH: 32.2 pg (ref 27.2–33.4)
MCH: 32.6 pg (ref 26.0–34.0)
MCHC: 34.3 g/dL (ref 32.0–36.0)
MCHC: 34.6 g/dL (ref 30.0–36.0)
MCV: 93.8 fL (ref 79.3–98.0)
MCV: 94.2 fL (ref 78.0–100.0)
MONO#: 1.1 10*3/uL — ABNORMAL HIGH (ref 0.1–0.9)
MONO%: 11.7 % (ref 0.0–14.0)
Monocytes Absolute: 0.5 10*3/uL (ref 0.1–1.0)
Monocytes Relative: 5 %
NEUT%: 56 % (ref 39.0–75.0)
NEUTROS ABS: 5.5 10*3/uL (ref 1.5–6.5)
Neutro Abs: 9.6 10*3/uL — ABNORMAL HIGH (ref 1.7–7.7)
Neutrophils Relative %: 93 %
Platelets: 238 10*3/uL (ref 140–400)
Platelets: 227 10*3/uL (ref 150–400)
RBC: 4.32 10*6/uL (ref 4.20–5.82)
RBC: 4.48 MIL/uL (ref 4.22–5.81)
RDW: 15 % — AB (ref 11.0–14.6)
RDW: 15.3 % (ref 11.5–15.5)
WBC: 9.7 10*3/uL (ref 4.0–10.3)
WBC: 10.3 10*3/uL (ref 4.0–10.5)
lymph#: 2.8 10*3/uL (ref 0.9–3.3)

## 2016-08-08 LAB — UA PROTEIN, DIPSTICK - CHCC: Protein, ur: NEGATIVE mg/dL

## 2016-08-08 LAB — COMPREHENSIVE METABOLIC PANEL
ALBUMIN: 3.7 g/dL (ref 3.5–5.0)
ALK PHOS: 72 U/L (ref 40–150)
ALT: 21 U/L (ref 0–55)
ALT: 29 U/L (ref 17–63)
AST: 24 U/L (ref 5–34)
AST: 52 U/L — ABNORMAL HIGH (ref 15–41)
Albumin: 4 g/dL (ref 3.5–5.0)
Alkaline Phosphatase: 75 U/L (ref 38–126)
Anion Gap: 11 mEq/L (ref 3–11)
Anion gap: 13 (ref 5–15)
BILIRUBIN TOTAL: 0.57 mg/dL (ref 0.20–1.20)
BUN: 18.5 mg/dL (ref 7.0–26.0)
BUN: 22 mg/dL — ABNORMAL HIGH (ref 6–20)
CO2: 23 meq/L (ref 22–29)
CO2: 20 mmol/L — ABNORMAL LOW (ref 22–32)
Calcium: 9.5 mg/dL (ref 8.4–10.4)
Calcium: 9 mg/dL (ref 8.9–10.3)
Chloride: 101 mEq/L (ref 98–109)
Chloride: 99 mmol/L — ABNORMAL LOW (ref 101–111)
Creatinine, Ser: 0.96 mg/dL (ref 0.61–1.24)
Creatinine: 1 mg/dL (ref 0.7–1.3)
EGFR: >90 mL/min/{1.73_m2} (ref >90)
GFR calc Af Amer: >60 mL/min (ref >60)
GFR calc non Af Amer: >60 mL/min (ref >60)
GLUCOSE: 270 mg/dL — AB (ref 70–140)
Glucose, Bld: 334 mg/dL — ABNORMAL HIGH (ref 65–99)
POTASSIUM: 4.1 meq/L (ref 3.5–5.1)
Potassium: 4.1 mmol/L (ref 3.5–5.1)
SODIUM: 135 meq/L — AB (ref 136–145)
Sodium: 132 mmol/L — ABNORMAL LOW (ref 135–145)
TOTAL PROTEIN: 7.8 g/dL (ref 6.4–8.3)
Total Bilirubin: 1.3 mg/dL — ABNORMAL HIGH (ref 0.3–1.2)
Total Protein: 7.8 g/dL (ref 6.5–8.1)

## 2016-08-08 LAB — CBG MONITORING, ED: GLUCOSE-CAPILLARY: 340 mg/dL — AB (ref 65–99)

## 2016-08-08 MED ORDER — PALONOSETRON HCL INJECTION 0.25 MG/5ML
0.2500 mg | Freq: Once | INTRAVENOUS | Status: AC
  Administered 2016-08-08: 0.25 mg via INTRAVENOUS

## 2016-08-08 MED ORDER — LOPERAMIDE HCL 2 MG PO CAPS
2.0000 mg | ORAL_CAPSULE | Freq: Once | ORAL | Status: AC
  Administered 2016-08-08: 2 mg via ORAL
  Filled 2016-08-08: qty 1

## 2016-08-08 MED ORDER — SODIUM CHLORIDE 0.9 % IV SOLN
Freq: Once | INTRAVENOUS | Status: AC
  Administered 2016-08-08: 14:00:00 via INTRAVENOUS

## 2016-08-08 MED ORDER — SODIUM CHLORIDE 0.9 % IV SOLN
15.0000 mg/kg | Freq: Once | INTRAVENOUS | Status: AC
  Administered 2016-08-08: 2700 mg via INTRAVENOUS
  Filled 2016-08-08: qty 96

## 2016-08-08 MED ORDER — DEXAMETHASONE SODIUM PHOSPHATE 10 MG/ML IJ SOLN
4.0000 mg | Freq: Once | INTRAMUSCULAR | Status: AC
  Administered 2016-08-08: 4 mg via INTRAVENOUS

## 2016-08-08 MED ORDER — DIPHENHYDRAMINE HCL 50 MG/ML IJ SOLN
50.0000 mg | Freq: Once | INTRAMUSCULAR | Status: AC | PRN
  Administered 2016-08-08: 50 mg via INTRAVENOUS

## 2016-08-08 MED ORDER — INSULIN REGULAR HUMAN 100 UNIT/ML IJ SOLN
6.0000 [IU] | Freq: Once | INTRAMUSCULAR | Status: AC
  Administered 2016-08-08: 6 [IU] via SUBCUTANEOUS
  Filled 2016-08-08: qty 0.06

## 2016-08-08 MED ORDER — HEPARIN SOD (PORK) LOCK FLUSH 100 UNIT/ML IV SOLN
500.0000 [IU] | Freq: Once | INTRAVENOUS | Status: AC
  Administered 2016-08-08: 500 [IU]
  Filled 2016-08-08: qty 5

## 2016-08-08 MED ORDER — SODIUM CHLORIDE 0.9 % IV BOLUS (SEPSIS): 1000.0000 mL | Freq: Once | INTRAVENOUS | Status: DC

## 2016-08-08 MED ORDER — DIPHENHYDRAMINE HCL 50 MG/ML IJ SOLN: 25.0000 mg | Freq: Once | INTRAMUSCULAR | Status: DC | PRN

## 2016-08-08 MED ORDER — LOPERAMIDE HCL 2 MG PO CAPS: 4.0000 mg | ORAL_CAPSULE | Freq: Once | ORAL | Status: DC

## 2016-08-08 MED ORDER — FAMOTIDINE IN NACL 20-0.9 MG/50ML-% IV SOLN
20.0000 mg | Freq: Once | INTRAVENOUS | Status: AC | PRN
  Administered 2016-08-08: 20 mg via INTRAVENOUS

## 2016-08-08 MED ORDER — PALONOSETRON HCL INJECTION 0.25 MG/5ML
INTRAVENOUS | Status: AC
  Filled 2016-08-08: qty 5

## 2016-08-08 MED ORDER — DEXAMETHASONE SODIUM PHOSPHATE 10 MG/ML IJ SOLN
INTRAMUSCULAR | Status: AC
  Filled 2016-08-08: qty 1

## 2016-08-08 MED ORDER — METHYLPREDNISOLONE SODIUM SUCC 125 MG IJ SOLR
125.0000 mg | Freq: Once | INTRAMUSCULAR | Status: AC | PRN
  Administered 2016-08-08: 125 mg via INTRAVENOUS

## 2016-08-08 MED ORDER — DEXTROSE 5 % IV SOLN
Freq: Once | INTRAVENOUS | Status: AC
  Administered 2016-08-08: 15:00:00 via INTRAVENOUS

## 2016-08-08 MED ORDER — OXALIPLATIN CHEMO INJECTION 100 MG/20ML
130.0000 mg/m2 | Freq: Once | INTRAVENOUS | Status: AC
  Administered 2016-08-08: 400 mg via INTRAVENOUS
  Filled 2016-08-08: qty 80

## 2016-08-08 MED ORDER — PROCHLORPERAZINE EDISYLATE 5 MG/ML IJ SOLN
5.0000 mg | Freq: Once | INTRAMUSCULAR | Status: AC
  Administered 2016-08-08: 5 mg via INTRAVENOUS
  Filled 2016-08-08: qty 2

## 2016-08-08 MED ORDER — SODIUM CHLORIDE 0.9 % IV SOLN
Freq: Once | INTRAVENOUS | Status: AC | PRN
  Administered 2016-08-08: 18:00:00 via INTRAVENOUS

## 2016-08-08 NOTE — Assessment & Plan Note (Signed)
Patient presented to the [facility] today to receive cycle 2 of his oxaliplatin chemotherapy and his first cycle of Avastin today.  See further notes for details.  Patient is scheduled to return for labs only on 08/29/2016.  He is scheduled for labs and a follow-up visit on 09/19/2016.

## 2016-08-08 NOTE — Discharge Instructions (Signed)
Please know that you are leaving against medical advice and there are risks of harm, or even death, by leaving. Make sure to drink plenty of fluids when you get home. Please follow up with your doctor tomorrow. Please return to the emergency department if you develop any new or worsening symptoms.

## 2016-08-08 NOTE — ED Notes (Signed)
Patient left AMA.

## 2016-08-08 NOTE — Progress Notes (Signed)
Glucose 270, pt eating snacks, pt to receive 6 units of insulin and no need for CBG recheck per Dr. Burr Medico. Food provided.

## 2016-08-08 NOTE — Progress Notes (Signed)
Per Dr Burr Medico, ok to treat with Avastin today , being 7 days after port placement.

## 2016-08-08 NOTE — ED Notes (Signed)
Bed: WA09 Expected date:  Expected time:  Means of arrival:  Comments: Pt from [facility] 

## 2016-08-08 NOTE — ED Notes (Signed)
Informed PA of patient request to see her.  Patient advised that did not want to wait to receive 2L of fluid and would like to be D/C.

## 2016-08-08 NOTE — Telephone Encounter (Signed)
Message sent to Chemo scheduler to be added per 08/08/16 los. Per Dr Burr Medico, (verbal) schedule only lab and chemo on 08/29/16, as per she will be on PAL and Lattie Haw nor Erasmo Downer will be in to see patient and to schedule patient to follow up with herself, labs and Chemo on 09/19/16. Patient is aware of changes. Appointment scheduled per 08/08/16 los. Patient was given a copy of the AVS report and appointment schedule per 08/08/16 los.

## 2016-08-08 NOTE — Patient Instructions (Signed)
[facility] Discharge Instructions for Patients Receiving Chemotherapy  Today you received the following chemotherapy agents: Oxaliplatin and Avastin   To help prevent nausea and vomiting after your treatment, we encourage you to take your nausea medication as directed.    If you develop nausea and vomiting that is not controlled by your nausea medication, call the clinic.   BELOW ARE SYMPTOMS THAT SHOULD BE REPORTED IMMEDIATELY:  *FEVER GREATER THAN 100.5 F  *CHILLS WITH OR WITHOUT FEVER  NAUSEA AND VOMITING THAT IS NOT CONTROLLED WITH YOUR NAUSEA MEDICATION  *UNUSUAL SHORTNESS OF BREATH  *UNUSUAL BRUISING OR BLEEDING  TENDERNESS IN MOUTH AND THROAT WITH OR WITHOUT PRESENCE OF ULCERS  *URINARY PROBLEMS  *BOWEL PROBLEMS  UNUSUAL RASH Items with * indicate a potential emergency and should be followed up as soon as possible.  Feel free to call the clinic you have any questions or concerns. The clinic phone number is [phone].  Please show the [facility] at check-in to the Emergency Department and triage nurse.   Bevacizumab injection What is this medicine? BEVACIZUMAB (be va SIZ yoo mab) is a monoclonal antibody. It is used to treat cervical cancer, colorectal cancer, glioblastoma multiforme, non-small cell lung cancer (NSCLC), ovarian cancer, and renal cell cancer. This medicine may be used for other purposes; ask your health care provider or pharmacist if you have questions. COMMON BRAND NAME(S): Avastin What should I tell my health care provider before I take this medicine? They need to know if you have any of these conditions: -blood clots -heart disease, including heart failure, heart attack, or chest pain (angina) -high blood pressure -infection (especially a virus infection such as chickenpox, cold sores, or herpes) -kidney disease -lung disease -prior chemotherapy with doxorubicin, daunorubicin, epirubicin, or other anthracycline type  chemotherapy agents -recent or ongoing radiation therapy -recent surgery -stroke -an unusual or allergic reaction to bevacizumab, hamster proteins, mouse proteins, other medicines, foods, dyes, or preservatives -pregnant or trying to get pregnant -breast-feeding How should I use this medicine? This medicine is for infusion into a vein. It is given by a health care professional in a hospital or clinic setting. Talk to your pediatrician regarding the use of this medicine in children. Special care may be needed. Overdosage: If you think you have taken too much of this medicine contact a poison control center or emergency room at once. NOTE: This medicine is only for you. Do not share this medicine with others. What if I miss a dose? It is important not to miss your dose. Call your doctor or health care professional if you are unable to keep an appointment. What may interact with this medicine? Interactions are not expected. This list may not describe all possible interactions. Give your health care provider a list of all the medicines, herbs, non-prescription drugs, or dietary supplements you use. Also tell them if you smoke, drink alcohol, or use illegal drugs. Some items may interact with your medicine. What should I watch for while using this medicine? Your condition will be monitored carefully while you are receiving this medicine. You will need important blood work and urine testing done while you are taking this medicine. During your treatment, let your health care professional know if you have any unusual symptoms, such as difficulty breathing. This medicine may rarely cause 'gastrointestinal perforation' (holes in the stomach, intestines or colon), a serious side effect requiring surgery to repair. This medicine should be started at least 28 days following major surgery and  the site of the surgery should be totally healed. Check with your doctor before scheduling dental work or surgery  while you are receiving this treatment. Talk to your doctor if you have recently had surgery or if you have a wound that has not healed. Do not become pregnant while taking this medicine or for 6 months after stopping it. Women should inform their doctor if they wish to become pregnant or think they might be pregnant. There is a potential for serious side effects to an unborn child. Talk to your health care professional or pharmacist for more information. Do not breast-feed an infant while taking this medicine. This medicine has caused ovarian failure in some women. This medicine may interfere with the ability to have a child. You should talk to your doctor or health care professional if you are concerned about your fertility. What side effects may I notice from receiving this medicine? Side effects that you should report to your doctor or health care professional as soon as possible: -allergic reactions like skin rash, itching or hives, swelling of the face, lips, or tongue -breathing problems -changes in vision -chest pain -confusion -jaw pain, especially after dental work -mouth sores -seizures -severe abdominal pain -severe headache -signs of decreased platelets or bleeding - bruising, pinpoint red spots on the skin, black, tarry stools, nosebleeds, blood in the urine -signs of infection - fever or chills, cough, sore throat, pain or trouble passing urine -sudden numbness or weakness of the face, arm or leg -swelling of legs or ankles -symptoms of a stroke: change in mental awareness, inability to talk or move one side of the body (especially in patients with lung cancer) -trouble passing urine or change in the amount of urine -trouble speaking or understanding -trouble walking, dizziness, loss of balance or coordination Side effects that usually do not require medical attention (report to your doctor or health care professional if they continue or are  bothersome): -constipation -diarrhea -dry skin -headache -loss of appetite -nausea, vomiting This list may not describe all possible side effects. Call your doctor for medical advice about side effects. You may report side effects to FDA at 1-800-FDA-1088. Where should I keep my medicine? This drug is given in a hospital or clinic and will not be stored at home. NOTE: This sheet is a summary. It may not cover all possible information. If you have questions about this medicine, talk to your doctor, pharmacist, or health care provider.  2017 Elsevier/Gold Standard (2015-06-23 15:28:53)

## 2016-08-08 NOTE — Assessment & Plan Note (Signed)
Patient presented to the [facility] today to receive cycle 2 of his oxaliplatin chemotherapy regimen, and cycle 1 of his Avastin.  Patient had been discharged from the [facility] and his Port-A-Cath had been discontinued as well.  He was sitting in the infusion room.  Awaiting his right home; when he developed some numbness of his tongue, scratchy throat, dizziness, and nausea.  His Port-A-Cath was reaccessed; and patient was given Benadryl 50 mg IV, Pepcid 20 mg IV, and Solu-Medrol 125 mg IV.  He was also given IV fluid bolus as well.  Of note-patient is a diabetic and was given insulin 6 units earlier this afternoon for blood sugar of 270.  Patient states he's had very little to eat today.  It was noted that patient was very diaphoretic and has been continuous some profuse vomiting.  At this current time.  He is still in the infusion area and states that he is having some diarrhea as well.  He continues to feel dizzy as well.  Patient was given some Sprite to sip on-but he vomited this up as well.  Reviewed all findings with Dr. Burr Medico- and she advised that the symptoms could very well be related to his chemotherapy; but could also be related to hypoglycemia as well.  Patient will be transported to the emergency department for further evaluation and management this evening.  Brief history and report were called to the emergency department charge nurse; prior to the patient being transported to the emergency department via wheelchair.  Per the cancer Center nurse.  CODE STATUS: Patient should be considered a full code; since he has no advance directives in the patient's chart.

## 2016-08-08 NOTE — Progress Notes (Signed)
1741: Patient deaccessed and ready to leave when he stated that he felt "like throwing up". Patient reported that his throat was "scratchy and his tongue felt "funny". Retta Mac NP called and Hypersensitivity Kit accessed.  1743: Benadryl 50 mg given IV, 1746 Solumedrol 125 IV given,  And 0233 Pepcid given. Patient continues to throw up..  Vitals 177/91 temp 98.5, O2 sat 94% pulse 108 and respirations 22. Patient states that he is not short of breath.  1818: Patient diaphoretic and still nauseated. Lynnda Shields NP instructed to give patient Sprite with sugar added.  Patient has eaten very little food today and received insuling earlier in his tx. 4356: Patient still nauseated with emesis. 1836; Patient given a piece of peppermint candy. 1640: Patient ran to bathroom with loose stools.  1910: Patient finished in bathroom and transported to ED with port accessed.

## 2016-08-08 NOTE — ED Provider Notes (Signed)
[facility] DEPT Provider Note   CSN: [id number] Arrival date & time: 08/08/16  1919  By signing my name below, I, Jeanell Sparrow, attest that this documentation has been prepared under the direction and in the presence of non-physician practitioner, Armstead Peaks, PA-C. Electronically Signed: Jeanell Sparrow, Scribe. 08/08/2016. 8:12 PM.  History   Chief Complaint Chief Complaint  Patient presents with  . Medication Reaction   The history is provided by the patient. No language interpreter was used.   HPI Comments: Chad [name] is a 46 y.o. male who presents to the Emergency Department complaining of a medication reaction that today. He had a sudden onset of symptoms following chemotherapy. He has been on chemotherapy for the past few months. He reports associated symptoms of dizziness (room-spinning), nausea, vomiting (several episodes for an hour), diarrhea, and abdominal soreness (from vomiting). He had some blood in his emesis. He symptoms have gradually improved. He denies any blood in stool, fever, chest pain, or SOB.     PCP: Delman Cheadle, MD  Past Medical History:  Diagnosis Date  . Cancer [facility])    colon cancer  . Coronary artery disease   . Diabetes mellitus without complication ([place]) 0000000   oral agents only (07/2015)  . ETOH abuse 2014  . Fatty liver 12/2014   on ultrasound. pt obese and abuses ETOH  . Gallstone 12/2014   GB stones and sludge on ultrasound and CT,  . GERD (gastroesophageal reflux disease)   . Hypertension   . Morbid obesity with BMI of 50.0-59.9, adult ([place]) 03/2013  . Rectal bleeding 05/2015   07/2015 colonoscopy with malignant appearing sigmoid mass. Additional rectal, descending, polyps  . Shoulder pain, bilateral    due to previous injury    Patient Active Problem List   Diagnosis Date Noted  . Hypersensitivity reaction 08/08/2016  . Adenocarcinoma of colon metastatic to liver ([facility]) 08/01/2016  . Genetic testing 03/05/2016  . Family history of  pancreatic cancer 12/21/2015  . 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 S/P colon resection 10/18/2015  . Cancer of left colon ([place])   . Benign neoplasm of descending colon   . Benign neoplasm of rectum   . Benign neoplasm of rectosigmoid junction   . Gastroesophageal reflux disease without esophagitis 06/15/2015  . Chronic systolic HF (heart failure) ([place]) 04/06/2015  . Coronary artery calcification 04/06/2015  . Calculus of gallbladder without cholecystitis without obstruction 01/14/2015  . Hepatic steatosis 01/14/2015  . Neuropathy due to type 2 diabetes mellitus ([facility]) 01/14/2015  . Polypharmacy 01/14/2015  . Alcohol use 01/14/2015  . Type 2 diabetes mellitus with hyperglycemia ([place]) 01/06/2015  . Morbid obesity ([place]) 01/06/2015  . Essential hypertension, benign 01/06/2015  . Diabetes mellitus without complication ([place]) 0000000    Past Surgical History:  Procedure Laterality Date  . CARDIAC CATHETERIZATION N/A 04/06/2015   Procedure: Left Heart Cath and Coronary Angiography;  Surgeon: Belva Crome, MD;  Location: [facility];  Service: Cardiovascular;  Laterality: N/A;  . COLONOSCOPY N/A 07/25/2015   Procedure: COLONOSCOPY;  Surgeon: Gatha Mayer, MD;  Location: [facility];  Service: Gastroenterology;  Laterality: N/A;  . CYSTOSCOPY  10/18/2015   Procedure: CYSTOSCOPY FLEXIBLE URETHRAL DIALATION AND FOLEY PLACEMENT;  Surgeon: Alexis Frock, MD;  Location: [facility] OR[facility];  Service: Urology;;  . ESOPHAGOGASTRODUODENOSCOPY N/A 07/25/2015   Procedure: ESOPHAGOGASTRODUODENOSCOPY (EGD);  Surgeon: Gatha Mayer, MD;  Location: [facility];  Service: Gastroenterology;  Laterality: N/A;  . PORTACATH PLACEMENT Right 08/01/2016  Procedure: INSERTION PORT-A-CATH WITH ULTRASOUND AND FLUOROSOPIC GUIDANCE - RIGHT INTERNAL JUGULAR;  Surgeon: Michael Boston, MD;  Location: [facility];  Service: General;  Laterality: Right;       Home Medications    Prior  to Admission medications   Medication Sig Start Date End Date Taking? Authorizing Provider  acetaminophen (TYLENOL) 500 MG tablet Take 1,000 mg by mouth every 6 (six) hours as needed for moderate pain or headache.   Yes Historical Provider, MD  Ascorbic Acid (VITAMIN C) 1000 MG tablet Take 1,000 mg by mouth daily.   Yes Historical Provider, MD  aspirin EC 81 MG tablet Take 1 tablet (81 mg total) by mouth daily. DO NOT TAKE AGAIN UNTIL 08/09/15 07/25/15  Yes Gatha Mayer, MD  capecitabine (XELODA) 500 MG tablet Take 5 tablets (2,500 mg total) by mouth 2 (two) times daily after a meal. 06/26/16  Yes Truitt Merle, MD  carvedilol (COREG) 6.25 MG tablet Take 1 tablet (6.25 mg total) by mouth 2 (two) times daily with a meal. Patient taking differently: Take 6.25 mg by mouth 2 (two) times daily.  07/13/15  Yes Shawnee Knapp, MD  gabapentin (NEURONTIN) 300 MG capsule Take 1 capsule (300 mg total) by mouth at bedtime. 04/03/16  Yes Shawnee Knapp, MD  glipiZIDE (GLUCOTROL XL) 5 MG 24 hr tablet Take 1 tablet (5 mg total) by mouth daily with breakfast. 05/27/16  Yes Shawnee Knapp, MD  lisinopril-hydrochlorothiazide (PRINZIDE,ZESTORETIC) 20-25 MG tablet TAKE 1 TABLET BY MOUTH DAILY 07/31/16  Yes Shawnee Knapp, MD  LORazepam (ATIVAN) 1 MG tablet TAKE 1 TABLET BY MOUTH AT BEDTIME AS NEEDED FOR ANXIETY 06/21/16  Yes Shawnee Knapp, MD  metFORMIN (GLUCOPHAGE-XR) 500 MG 24 hr tablet TAKE 4 TABLETS BY MOUTH EVERY DAY WITH BREAKFAST 07/31/16  Yes Shawnee Knapp, MD  omeprazole (PRILOSEC) 40 MG capsule TAKE 1 CAPSULE(40 MG) BY MOUTH DAILY 05/27/16  Yes Shawnee Knapp, MD  ondansetron (ZOFRAN) 8 MG tablet Take 1 tablet (8 mg total) by mouth every 8 (eight) hours as needed for nausea or vomiting. 07/18/16  Yes Truitt Merle, MD  prochlorperazine (COMPAZINE) 10 MG tablet Take 1 tablet (10 mg total) by mouth every 6 (six) hours as needed for nausea or vomiting. 07/18/16  Yes Truitt Merle, MD    Family History Family History  Problem Relation Age of Onset  .  Diabetes Father   . Pancreatic cancer Father 93    former smoker, but had not smoked in 32 years  . Cancer Maternal Uncle     d. 64s; unknown type of cancer -metastatic throughout abdomen   . Other Brother     oldest brother has had a normal colonoscopy  . Heart attack Maternal Grandfather     d. 57-58; heavy smoker and drinker  . Heart attack Paternal Grandmother     d. early 55s  . Heart attack Paternal Grandfather 33  . Heart failure Maternal Uncle   . Skin cancer Maternal Uncle 42    NOS type; was out in the sun a lot  . Brain cancer Maternal Uncle     NOS type; mother's maternal half-brother dx. 39-68  . Cirrhosis Paternal Uncle   . Cancer Paternal Uncle     (x2-3) uncles with NOS cancers; d. 40s    Social History Social History  Substance Use Topics  . Smoking status: Never Smoker  . Smokeless tobacco: Current User    Types: Chew     Comment:  occasionally dips tobacco - has used for approx 20 yrs (12/21/15)  . Alcohol use Yes     Comment: heavy drinking for 10 years, 2-3  drinks of liqour a night, has been cutting back.       Allergies   Patient has no known allergies.   Review of Systems Review of Systems  Constitutional: Negative for chills and fever.  HENT: Negative for facial swelling and sore throat.   Respiratory: Negative for shortness of breath.   Cardiovascular: Negative for chest pain.  Gastrointestinal: Positive for abdominal pain, diarrhea, nausea and vomiting. Negative for blood in stool.  Genitourinary: Negative for dysuria.  Musculoskeletal: Negative for back pain.  Skin: Negative for rash and wound.  Neurological: Positive for dizziness (room-spinning). Negative for headaches.  Psychiatric/Behavioral: The patient is not nervous/anxious.      Physical Exam Updated Vital Signs BP 134/70 (BP Location: Right Arm)   Pulse (!) 121   Temp 98.3 F (36.8 C) (Oral)   Resp 16   SpO2 93%   Physical Exam  Constitutional: He appears well-developed  and well-nourished. No distress.  HENT:  Head: Normocephalic and atraumatic.  Mouth/Throat: Oropharynx is clear and moist. Mucous membranes are dry. No oropharyngeal exudate.  Eyes: Conjunctivae are normal. Pupils are equal, round, and reactive to light. Right eye exhibits no discharge. Left eye exhibits no discharge. No scleral icterus.  Neck: Normal range of motion. Neck supple. No thyromegaly present.  Cardiovascular: Normal rate, regular rhythm, normal heart sounds and intact distal pulses.  Exam reveals no gallop and no friction rub.   No murmur heard. Pulmonary/Chest: Effort normal and breath sounds normal. No stridor. No respiratory distress. He has no wheezes. He has no rales.  Abdominal: Soft. Bowel sounds are normal. He exhibits no distension. There is no tenderness. There is no rebound and no guarding.  Musculoskeletal: He exhibits no edema.  Lymphadenopathy:    He has no cervical adenopathy.  Neurological: He is alert. Coordination normal.  Skin: Skin is warm and dry. No rash noted. He is not diaphoretic. No pallor.  Psychiatric: He has a normal mood and affect.  Nursing note and vitals reviewed.    ED Treatments / Results  DIAGNOSTIC STUDIES: Oxygen Saturation is 93% on RA, normal by my interpretation.    COORDINATION OF CARE: 8:16 PM- Pt advised of plan for treatment and pt agrees.  Labs (all labs ordered are listed, but only abnormal results are displayed) Labs Reviewed  COMPREHENSIVE METABOLIC PANEL - Abnormal; Notable for the following:       Result Value   Sodium 132 (*)    Chloride 99 (*)    CO2 20 (*)    Glucose, Bld 334 (*)    BUN 22 (*)    AST 52 (*)    Total Bilirubin 1.3 (*)    All other components within normal limits  CBC WITH DIFFERENTIAL/PLATELET - Abnormal; Notable for the following:    Neutro Abs 9.6 (*)    Lymphs Abs 0.2 (*)    All other components within normal limits  CBG MONITORING, ED - Abnormal; Notable for the following:     Glucose-Capillary 340 (*)    All other components within normal limits    EKG  EKG Interpretation None       Radiology No results found.  Procedures Procedures (including critical care time)  Medications Ordered in ED Medications  sodium chloride 0.9 % bolus 1,000 mL (not administered)  sodium chloride 0.9 % bolus 1,000  mL (not administered)  heparin lock flush 100 unit/mL (not administered)  prochlorperazine (COMPAZINE) injection 5 mg (5 mg Intravenous Given 08/08/16 2110)  loperamide (IMODIUM) capsule 2 mg (2 mg Oral Given 08/08/16 2153)     Initial Impression / Assessment and Plan / ED Course  I have reviewed the triage vital signs and the nursing notes.  Pertinent labs & imaging results that were available during my care of the patient were reviewed by me and considered in my medical decision making (see chart for details).     CBC unremarkable. CMP shows sodium 132, chloride 99, CO2 20, glucose 334, BUN 22, AST 52, total bilirubin 1.3.  Patient given 1/2 L normal saline, Solu-Medrol, Benadryl, Pepcid at the cancer center. Patient given Compazine here. Patient states his nausea and vomiting is improving. He also states his dizziness is improving. Patient declined any further fluids or medications. He did request Imodium. Patient would like to leave [facility] [place], despite his tachycardia around 120. The patient understands the risks of leaving [facility]. He is to follow-up with his primary care provider tomorrow and return for any worsening symptoms. I advised patient to drink plenty of fluids tonight. Patient understands and agrees with plan. I discussed patient's case with Dr. Ellender Hose who guided the patient's management and agrees with plan.  Final Clinical Impressions(s) / ED Diagnoses   Final diagnoses:  Nausea vomiting and diarrhea  Status post chemotherapy    New Prescriptions New Prescriptions   No medications on file   I personally  performed the services described in this documentation, which was scribed in my presence. The recorded information has been reviewed and is accurate.     Frederica Kuster, PA-C 08/08/16 2154    Duffy Bruce, MD 08/10/16 1037

## 2016-08-08 NOTE — Progress Notes (Signed)
SYMPTOM MANAGEMENT CLINIC    Chief Complaint: Hypersensitivity reaction  HPI:  Chad [name] 46 y.o. male diagnosed with colon cancer with liver metastasis.  Currently undergoing oxaliplatin and Avastin chemotherapy regimen.   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   Staging form: Colon and Rectum, AJCC 7th Edition     Pathologic stage from 10/18/2015: Stage IIIA (T2, N1a, cM0) - Signed by Truitt Merle, MD on 11/15/2015       Cancer of left colon ([place])   05/11/2015 Imaging    CT ABD/PELVIS: Gallstones w/sludge;liver prominent without focal liver lesion, mild thickening of bladder wall; negative for cancer      05/18/2015 Imaging    [place] ABD: Negative      07/25/2015 Initial Diagnosis    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 8 mm descending polyp and 5 mm rectosigmoid polyp;18-29 mm proximal rectal polyp      07/25/2015 Tumor Marker    CEA=0.7      07/26/2015 Pathology Results    Sigmoid mass: invasive colorectal adenocarcinoma      10/22/2015 Pathologic Stage    T2, N1, MO    #29 nodes examined w/ 1 postive node; moderately differentiated; Negative for Lymph-vascular and Peri-neural invasion; Microscopic extension into muscularis propria      10/26/2015 Pathology Results    MSI Stable      11/30/2015 - 02/08/2016 Chemotherapy    Adjuvant chemotherapy with Capecitabine 2500 mg twice daily, on day 1-14, oxaliplatin 130 mg/m, every 21 days      05/01/2016 Imaging    Surveillance CT CAP scan showed new more focal low-density lesions in the liver, possible metastasis. No other new lesions.      06/05/2016 Imaging    MR ABDOMEN W WO CONTRAST IMPRESSION: 1. Six bilobar enhancing lesions consistent  with hepatic metastasis. 2. No additional metastatic disease in the upper abdomen. 3. Cholelithiasis.      06/24/2016 Pathology Results    US Biopsy Diagnosis Liver, needle/core biopsy, right lobe - METASTATIC ADENOCARCINOMA, CONSISTENT WITH COLORECTAL PRIMARY.      07/16/2016 Miscellaneous    Foundation One results received       Review of Systems  Constitutional: Positive for malaise/fatigue and weight loss.  Gastrointestinal: Positive for nausea and vomiting.  Neurological: Positive for dizziness.  All other systems reviewed and are negative.   Past Medical History:  Diagnosis Date  . Cancer [facility])    colon cancer  . Coronary artery disease   . Diabetes mellitus without complication ([place]) 08/6413   oral agents only (07/2015)  . ETOH abuse 2014  . Fatty liver 12/2014   on ultrasound. pt obese and abuses ETOH  . Gallstone 12/2014   GB stones and sludge on ultrasound and CT,  . GERD (gastroesophageal reflux disease)   . Hypertension   . Morbid obesity with BMI of 50.0-59.9, adult ([place]) 03/2013  . Rectal bleeding 05/2015   07/2015 colonoscopy with malignant appearing sigmoid mass. Additional rectal, descending, polyps  . Shoulder pain, bilateral    due to previous injury    Past Surgical History:  Procedure Laterality Date  . CARDIAC CATHETERIZATION N/A 04/06/2015   Procedure: Left Heart Cath and Coronary Angiography;  Surgeon: Belva Crome, MD;  Location: [place] [facility];  Service: Cardiovascular;  Laterality: N/A;  . COLONOSCOPY N/A 07/25/2015   Procedure: COLONOSCOPY;  Surgeon: Gatha Mayer, MD;  Location: [facility];  Service: Gastroenterology;  Laterality: N/A;  . CYSTOSCOPY  10/18/2015   Procedure: CYSTOSCOPY FLEXIBLE URETHRAL DIALATION AND FOLEY PLACEMENT;  Surgeon: Alexis Frock, MD;  Location: [facility] OR[facility];  Service: Urology;;  . ESOPHAGOGASTRODUODENOSCOPY N/A 07/25/2015   Procedure: ESOPHAGOGASTRODUODENOSCOPY (EGD);  Surgeon: Gatha Mayer, MD;  Location: [facility];  Service: Gastroenterology;  Laterality: N/A;  . PORTACATH PLACEMENT Right 08/01/2016   Procedure: INSERTION PORT-A-CATH WITH ULTRASOUND AND FLUOROSOPIC GUIDANCE - RIGHT INTERNAL JUGULAR;  Surgeon: Michael Boston, MD;  Location: [place];  Service: General;  Laterality: Right;    has Type 2 diabetes mellitus with hyperglycemia ([place]); Morbid obesity ([place]); Essential hypertension, benign; Calculus of gallbladder without cholecystitis without obstruction; Hepatic steatosis; Neuropathy due to type 2 diabetes mellitus ([place]); Polypharmacy; Alcohol use; Chronic systolic HF (heart failure) ([place]); Coronary artery calcification; Gastroesophageal reflux disease without esophagitis; Cancer of left colon ([place]); Benign neoplasm of descending colon; Benign neoplasm of rectum; Benign neoplasm of rectosigmoid junction; 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    has No Known Allergies.  Allergies as of 08/08/2016   No Known Allergies     Medication List       Accurate as of 08/08/16  7:16 PM. Always use your most recent med list.          acetaminophen 500 MG tablet Commonly known as:  TYLENOL Take 1,000 mg by mouth every 6 (six) hours as needed for moderate pain or headache.   aspirin EC 81 MG tablet Take 1 tablet (81 mg total) by mouth daily. DO NOT TAKE AGAIN UNTIL 08/09/15   capecitabine 500 MG tablet Commonly known as:  XELODA Take 5 tablets (2,500 mg total) by mouth 2 (two) times daily after a meal.   carvedilol 6.25 MG tablet Commonly known as:  COREG Take 1 tablet (6.25 mg total) by mouth 2 (two) times daily with a meal.   gabapentin 300 MG capsule Commonly known as:  NEURONTIN Take 1 capsule (300 mg total) by mouth at bedtime.   glipiZIDE 5  MG 24 hr tablet Commonly known as:  GLUCOTROL XL Take 1 tablet (5 mg total) by mouth daily with breakfast.   lisinopril-hydrochlorothiazide 20-25 MG tablet Commonly known as:  PRINZIDE,ZESTORETIC TAKE 1 TABLET BY MOUTH DAILY   LORazepam 1 MG tablet Commonly known as:  ATIVAN TAKE 1 TABLET BY MOUTH AT BEDTIME AS NEEDED FOR ANXIETY   metFORMIN 500 MG 24 hr tablet Commonly known as:  GLUCOPHAGE-XR TAKE 4 TABLETS BY MOUTH EVERY DAY WITH BREAKFAST   omeprazole 40 MG capsule Commonly known as:  PRILOSEC TAKE 1 CAPSULE(40 MG) BY MOUTH DAILY   ondansetron 8 MG tablet Commonly known as:  ZOFRAN Take 1 tablet (8 mg total) by mouth every 8 (eight) hours as needed for nausea or vomiting.   prochlorperazine 10 MG tablet Commonly known as:  COMPAZINE Take 1 tablet (10 mg total) by mouth every 6 (six) hours as needed for nausea or vomiting.   vitamin C 1000 MG tablet Take 1,000 mg by mouth daily.        PHYSICAL EXAMINATION  Oncology Vitals 08/08/2016 08/08/2016  Height - -  Weight - -  Weight (lbs) - -  BMI (kg/m2) - -  Temp 98.6 98.5  Pulse 125 108  Resp 23 19  SpO2 96 94  BSA (m2) - -   BP Readings from Last 2 Encounters:  08/08/16 (!) 158/80  08/08/16 (!) 154/84    Physical Exam  Constitutional: He is oriented to person, place, and time. He appears unhealthy. He appears distressed.  HENT:  Head: Normocephalic and atraumatic.  Mouth/Throat: Oropharynx is clear and moist.  Eyes: Conjunctivae and EOM are normal. Pupils are equal, round, and reactive to light.  Neck: Normal range of motion. Neck supple.  Pulmonary/Chest: Effort normal. No respiratory distress.  Musculoskeletal: Normal range of motion.  Neurological: He is alert and oriented to person, place, and time.  Skin: Skin is warm. He is diaphoretic.  Very diaphoretic  Psychiatric: Affect normal.  Vitals reviewed.   LABORATORY DATA:. Appointment on 08/08/2016  Component Date Value Ref Range Status  . WBC  08/08/2016 9.7  4.0 - 10.3 10e3/uL Final  . NEUT# 08/08/2016 5.5  1.5 - 6.5 10e3/uL Final  . HGB 08/08/2016 13.9  13.0 - 17.1 g/dL Final  . HCT 08/08/2016 40.5  38.4 - 49.9 % Final  . Platelets 08/08/2016 238  140 - 400 10e3/uL Final  . MCV 08/08/2016 93.8  79.3 - 98.0 fL Final  . MCH 08/08/2016 32.2  27.2 - 33.4 pg Final  . MCHC 08/08/2016 34.3  32.0 - 36.0 g/dL Final  . RBC 08/08/2016 4.32  4.20 - 5.82 10e6/uL Final  . RDW 08/08/2016 15.0* 11.0 - 14.6 % Final  . lymph# 08/08/2016 2.8  0.9 - 3.3 10e3/uL Final  . MONO# 08/08/2016 1.1* 0.1 - 0.9 10e3/uL Final  . Eosinophils Absolute 08/08/2016 0.3  0.0 - 0.5 10e3/uL Final  . Basophils Absolute 08/08/2016 0.0  0.0 - 0.1 10e3/uL Final  . NEUT% 08/08/2016 56.0  39.0 - 75.0 % Final  . LYMPH% 08/08/2016 29.2  14.0 - 49.0 % Final  . MONO% 08/08/2016 11.7  0.0 - 14.0 % Final  . EOS% 08/08/2016 2.9  0.0 - 7.0 % Final  . BASO% 08/08/2016 0.2  0.0 - 2.0 % Final  . Sodium 08/08/2016 135* 136 - 145 mEq/L Final  . Potassium 08/08/2016 4.1  3.5 - 5.1 mEq/L Final  . Chloride 08/08/2016 101  98 - 109 mEq/L Final  . CO2 08/08/2016 23  22 - 29 mEq/L Final  . Glucose 08/08/2016 270* 70 - 140 mg/dl Final  . BUN 08/08/2016 18.5  7.0 - 26.0 mg/dL Final  . Creatinine 08/08/2016 1.0  0.7 - 1.3 mg/dL Final  . Total Bilirubin 08/08/2016 0.57  0.20 - 1.20 mg/dL Final  . Alkaline Phosphatase 08/08/2016 72  40 - 150 U/L Final  . AST 08/08/2016 24  5 - 34 U/L Final  . ALT 08/08/2016 21  0 - 55 U/L Final  . Total Protein 08/08/2016 7.8  6.4 - 8.3 g/dL Final  . Albumin 08/08/2016 3.7  3.5 - 5.0 g/dL Final  . Calcium 08/08/2016 9.5  8.4 - 10.4 mg/dL Final  . Anion Gap 08/08/2016 11  3 - 11 mEq/L Final  . EGFR 08/08/2016 >90  >90 ml/min/1.73 m2 Final  . Protein, ur 08/08/2016 Negative  Negative- <30 mg/dL Final    RADIOGRAPHIC STUDIES: No results found.  ASSESSMENT/PLAN:    Hypersensitivity reaction Patient presented to the [facility] today to receive  cycle 2 of his oxaliplatin chemotherapy regimen, and cycle 1 of his Avastin.  Patient had  been discharged from the [facility] [place] and his Port-A-Cath had been discontinued as well.  He was sitting in the infusion room.  Awaiting his right home; when he developed some numbness of his tongue, scratchy throat, dizziness, and nausea.  His Port-A-Cath was reaccessed; and patient was given Benadryl 50 mg IV, Pepcid 20 mg IV, and Solu-Medrol 125 mg IV.  He was also given IV fluid bolus as well.  Of note-patient is a diabetic and was given insulin 6 units earlier this afternoon for blood sugar of 270.  Patient states he's had very little to eat today.  It was noted that patient was very diaphoretic and has been continuous some profuse vomiting.  At this current time.  He is still in the infusion area and states that he is having some diarrhea as well.  He continues to feel dizzy as well.  Patient was given some Sprite to sip on-but he vomited this up as well.  Reviewed all findings with Dr. Burr Medico- and she advised that the symptoms could very well be related to his chemotherapy; but could also be related to hypoglycemia as well.  Patient will be transported to the emergency department for further evaluation and management this evening.  Brief history and report were called to the emergency department charge nurse; prior to the patient being transported to the emergency department via wheelchair.  Per the cancer Center nurse.  CODE STATUS: Patient should be considered a full code; since he has no advance directives in the patient's chart.    Cancer of left colon [facility]) Patient presented to the [place] today to receive cycle 2 of his oxaliplatin chemotherapy and his first cycle of Avastin today.  See further notes for details.  Patient is scheduled to return for labs only on 08/29/2016.  He is scheduled for labs and a follow-up visit on 09/19/2016.   Patient stated understanding of all instructions; and  was in agreement with this plan of care. The patient knows to call the clinic with any problems, questions or concerns.   Total time spent with patient was 40 minutes;  with greater than 75 percent of that time spent in face to face counseling regarding patient's symptoms,  and coordination of care and follow up.  Disclaimer:This dictation was prepared with Dragon/digital dictation along with [employer]. Any transcriptional errors that result from this process are unintentional.  Drue Second, NP 08/08/2016

## 2016-08-08 NOTE — ED Notes (Signed)
[facility] being sent [facility].  C/o dizziness, tongue numbness, and emesis after receiving chemo this afternoon.  [facility] NP concerned for hypoglycemia.  Hx of colon CA w/ liver mets.  Benadryl 50mg , Pepcid 20mg , and Solumedrol 125mg  given at [facility].    Retta Mac, [facility] NP, reports Pt can NOT have Zofran d/t pre-chemo treatment.

## 2016-08-09 ENCOUNTER — Telehealth: Payer: Self-pay | Admitting: *Deleted

## 2016-08-09 ENCOUNTER — Telehealth: Payer: Self-pay | Admitting: Emergency Medicine

## 2016-08-09 ENCOUNTER — Telehealth: Payer: Self-pay | Admitting: Hematology

## 2016-08-09 NOTE — Telephone Encounter (Signed)
Dr Burr Medico called to follow up with patient and to discuss plan of care and next scheduled treatment.

## 2016-08-09 NOTE — Telephone Encounter (Signed)
He went noticed after his oxaliplatin infusion yesterday. I called patient this morning, he did not answer. I spoke with his mother, he is staying with his brother, feels better. I told her not to start Xeloda until he feels back to normal again.  I'll reschedule his next chemotherapy treatment to 2/20, when I'm back to the office. I will see him before treatment and likely reduce his oxaliplatin dosage, with more premedication to prevent reaction. Message center scheduler.  Truitt Merle  08/09/2016

## 2016-08-09 NOTE — Telephone Encounter (Signed)
Per 1/25 LOS and staff message I have scheduled appt and notified the scheduler

## 2016-08-10 ENCOUNTER — Encounter: Payer: Self-pay | Admitting: Hematology

## 2016-08-11 NOTE — Progress Notes (Signed)
Subjective:    Patient ID: Chad [name], male    DOB: October 13, 1970, 46 y.o.   MRN: [id number] Chief Complaint  Patient presents with  . Medication Problem    discuss adjusting DM medications    HPI  Chad [name] is a 46 yo male who is here to follow-up on his chronic medical conditions.    Colon cancer: s/p resection, now undergoing chemo. Following with Dr. Gabriel Carina was seen in the ED 4d prior for n/v/d x 1d inc hematemasis and abd pain after chemo. CBG was 334. Pt did leave the ER AMA as his sxs had resolved after symptomatic treatment but he was still tachycardic and agreed to o/p follow-up the following day (which did not happen). His oncologist Dr. Burr Medico plans to likely reduce his dose and premed before his next treatment.  His CEA earlier this mo was sig elev - has been steadily increasing over the past year 4.5-> 64.7->310.  DMII: On glipizide xl 5mg  qam metformin 2g qd with occ diarrhea. Tends to have morning highs but better when he checks cbgs 2 hours after eating breakfast. hgba1c 6.9-> 7.2 -> 8.3. + microalb 11/2014. Optho? Referred to Dr. Katy Fitch May 2016.  He had to be given steroids after his last chemo treatment. He was told he was hypoglycemic and given D5 when he was actually hyperglycemic and then had to be given prednisone.  His cbgs were initially 300s and now starting to trend down but still in 200s.  He still has nausea and diarrhea but this just started after the last round of chemo. Not eating well, appetite intermittent .About 2 wks after chemo he will start getting his appetite back.  He is really watching what he is eating and making sure he is getting protein in first, small high protein snacks.  HPL goal <70: was prev on pravastatin 40 but stopped 1 yr prior when he was having abd pain and LFT elevation (the former of which ended up being due to colon cancer). Last lipid panel 1 yr prior 12/16 LDL 63, non-hdl 99.   Chronic systolic CHF:  HTN: On lisinopril-hctz 20-25  qd and carvedilol 6.25mg  bid.  Hepatic Steatosis: recent lfts have been normal until mild ast bump with the n/v/d.  Peripheral neuropathy 2/2 DM: On gabapentin 300mg  qhs  EtoH use:  Morbid obesity:  Insomnia: Using prn lorazepam 1mg  qhs - just started using this within the last year, uses about 1/2 of the days  GERD: on omeprazole 40mg  qd.  Depression screen [facility] 2/9 08/12/2016 04/03/2016 07/13/2015 05/25/2015 01/06/2015  Decreased Interest 0 0 0 0 0  Down, Depressed, Hopeless 3 0 0 0 0  PHQ - 2 Score 3 0 0 0 0  Altered sleeping - - - - -  Tired, decreased energy - - - - -  Change in appetite - - - - -  Feeling bad or failure about yourself  - - - - -  Trouble concentrating - - - - -  Moving slowly or fidgety/restless - - - - -  Suicidal thoughts - - - - -  PHQ-9 Score - - - - -    Review of Systems  Constitutional: Positive for appetite change. Negative for activity change, fatigue and unexpected weight change.  Neurological: Negative for weakness and numbness.  Psychiatric/Behavioral: Positive for dysphoric mood and sleep disturbance. The patient is nervous/anxious.    See hpi    Objective:   Physical Exam  Constitutional: He is oriented  to person, place, and time. He appears well-developed and well-nourished. No distress.  HENT:  Head: Normocephalic and atraumatic.  Eyes: Conjunctivae are normal. Pupils are equal, round, and reactive to light. No scleral icterus.  Neck: Normal range of motion. Neck supple. No thyromegaly present.  Cardiovascular: Normal rate, regular rhythm, normal heart sounds and intact distal pulses.   Pulmonary/Chest: Effort normal and breath sounds normal. No respiratory distress.  Musculoskeletal: He exhibits no edema.  Lymphadenopathy:    He has no cervical adenopathy.  Neurological: He is alert and oriented to person, place, and time.  Skin: Skin is warm and dry. He is not diaphoretic.  Psychiatric: He has a normal mood and affect. His behavior  is normal.      BP (!) 130/95 (BP Location: Right Arm, Patient Position: Sitting, Cuff Size: Large)   Pulse (!) 107   Temp 98.2 F (36.8 C) (Oral)   Resp 18   Ht 6\' 3"  (1.905 m)   Wt (!) 374 lb (169.6 kg)   SpO2 96%   BMI 46.75 kg/m      Results for orders placed or performed in visit on 08/12/16  CBC  Result Value Ref Range   WBC 7.6 3.4 - 10.8 x10E3/uL   RBC 4.63 4.14 - 5.80 x10E6/uL   Hemoglobin 15.1 13.0 - 17.7 g/dL   Hematocrit 45.2 37.5 - 51.0 %   MCV 98 (H) 79 - 97 fL   MCH 32.6 26.6 - 33.0 pg   MCHC 33.4 31.5 - 35.7 g/dL   RDW 15.0 12.3 - 15.4 %   Platelets 230 150 - 379 x10E3/uL  Comprehensive metabolic panel  Result Value Ref Range   Glucose 275 (H) 65 - 99 mg/dL   BUN 19 6 - 24 mg/dL   Creatinine, Ser 0.98 0.76 - 1.27 mg/dL   GFR calc non Af Amer 91 >59 mL/min/1.73   GFR calc Af Amer 106 >59 mL/min/1.73   BUN/Creatinine Ratio 19 9 - 20   Sodium 135 134 - 144 mmol/L   Potassium 4.3 3.5 - 5.2 mmol/L   Chloride 95 (L) 96 - 106 mmol/L   CO2 20 18 - 29 mmol/L   Calcium 9.2 8.7 - 10.2 mg/dL   Total Protein 7.9 6.0 - 8.5 g/dL   Albumin 4.3 3.5 - 5.5 g/dL   Globulin, Total 3.6 1.5 - 4.5 g/dL   Albumin/Globulin Ratio 1.2 1.2 - 2.2   Bilirubin Total 1.0 0.0 - 1.2 mg/dL   Alkaline Phosphatase 73 39 - 117 IU/L   AST 52 (H) 0 - 40 IU/L   ALT 47 (H) 0 - 44 IU/L  HIV antibody  Result Value Ref Range   HIV Screen 4th Generation wRfx Non Reactive Non Reactive  Lipid panel  Result Value Ref Range   Cholesterol, Total 126 100 - 199 mg/dL   Triglycerides 371 (H) 0 - 149 mg/dL   HDL 28 (L) >39 mg/dL   VLDL Cholesterol Cal 74 (H) 5 - 40 mg/dL   LDL Calculated 24 0 - 99 mg/dL   Chol/HDL Ratio 4.5 0.0 - 5.0 ratio units  Insulin and C-Peptide  Result Value Ref Range   INSULIN 30.7 (H) 2.6 - 24.9 uIU/mL   C-Peptide 5.7 (H) 1.1 - 4.4 ng/mL  POCT glycosylated hemoglobin (Hb A1C)  Result Value Ref Range   Hemoglobin A1C 9.7     Assessment & Plan:   Needs  pneumovax, flu shot, and tetanus but defer until after chemo is completed as  unlikely to have adequate immune response refer to optho? Needs foot exam - will need to have done at f/u.   1. Type 2 diabetes mellitus with hyperglycemia, without long-term current use of insulin (HCC) - a1c had markedly worsened recently which I am concerned is due to the stress from recent diagnosis of liver metastasis from prior colon cancer and restarting chemo last month in addition to the chemo pretreatment with dexamethasone he has required.  He has changed his diet - less fast food and sugar. Had to be given a one time insulin inj prior to chemo sev d ago. I think it is best to start pt on insulin to trx as he is likely to have a moving target as his malignancy and treatment evolve. So many DM meds have nausea as a poss side effect which he def doesn't need right now with the colon cancer mets to liver. Pt understands and is agreeable. Start basiglar 10uqhs. Check fasting a.m. cbgs, 2 hrs after lunch, and qhs so we can titrate level and email #s MyChart so we can titrate. Increase basiglar by 2u q3d until fasting a.m. cbg <150.  Will stop metformin and glipizide as do not want to exac nausea and cbg lability/hypoglycemia poss but hopefully when he comes through chemo he will be able to get back onto oral treatment.  Low threshold for referral to endocrine for management if needed.  2. Neuropathy due to type 2 diabetes mellitus ([place]) - not bothering pt now, was secondary to chemotherapy oxaliplatin  3. Hepatic steatosis   4. Essential hypertension, benign - rec increasing coreg from 6.25 bid to 12.5 bid. Cont lisinopril-hctz 20-25.  5. Chronic systolic HF (heart failure) ([place])   6. Cancer of left colon ([place])   7. Polypharmacy   8. Morbid obesity ([place])   9. Screening examination for infectious disease   10. Hematemesis with nausea   11.    Adjustment d/o with anxiety and insomnia - has been doing better since  increasing trazodone to bid - ok to cont  Orders Placed This Encounter  Procedures  . CBC  . Comprehensive metabolic panel    Order Specific Question:   Has the patient fasted?    Answer:   Yes  . Microalbumin/Creatinine Ratio, Urine  . HIV antibody  . Lipid panel    Order Specific Question:   Has the patient fasted?    Answer:   Yes  . Insulin and C-Peptide  . Care order/instruction:    AVS printed - let patient go!  Marland Kitchen POCT glycosylated hemoglobin (Hb A1C)    Meds ordered this encounter  Medications  . traZODone (DESYREL) 50 MG tablet    Sig: Take 1/2 tab po qam and 1.5 tabs po qhs    Dispense:  180 tablet    Refill:  1  . Insulin Glargine (BASAGLAR KWIKPEN) 100 UNIT/ML SOPN    Sig: Inject 10u into the skin once a day, titrate up as advised by physician    Dispense:  15 mL    Refill:  3  . Insulin Pen Needle (PEN NEEDLES) 32G X 4 MM MISC    Sig: 1 Units by Does not apply route daily.    Dispense:  90 each    Refill:  4    Delman Cheadle, M.D.  Primary Care at [facility] [address] [place], [place] [address] [phone] phone [phone] fax  08/30/16 12:22 AM

## 2016-08-12 ENCOUNTER — Ambulatory Visit (INDEPENDENT_AMBULATORY_CARE_PROVIDER_SITE_OTHER): Payer: BLUE CROSS/BLUE SHIELD | Admitting: Family Medicine

## 2016-08-12 ENCOUNTER — Encounter: Payer: Self-pay | Admitting: Family Medicine

## 2016-08-12 VITALS — BP 130/95 | HR 107 | Temp 98.2°F | Resp 18 | Ht 75.0 in | Wt 374.0 lb

## 2016-08-12 DIAGNOSIS — K760 Fatty (change of) liver, not elsewhere classified: Secondary | ICD-10-CM

## 2016-08-12 DIAGNOSIS — K920 Hematemesis: Secondary | ICD-10-CM

## 2016-08-12 DIAGNOSIS — C186 Malignant neoplasm of descending colon: Secondary | ICD-10-CM

## 2016-08-12 DIAGNOSIS — E1165 Type 2 diabetes mellitus with hyperglycemia: Secondary | ICD-10-CM | POA: Diagnosis not present

## 2016-08-12 DIAGNOSIS — I5022 Chronic systolic (congestive) heart failure: Secondary | ICD-10-CM

## 2016-08-12 DIAGNOSIS — E1140 Type 2 diabetes mellitus with diabetic neuropathy, unspecified: Secondary | ICD-10-CM | POA: Diagnosis not present

## 2016-08-12 DIAGNOSIS — Z119 Encounter for screening for infectious and parasitic diseases, unspecified: Secondary | ICD-10-CM

## 2016-08-12 DIAGNOSIS — I10 Essential (primary) hypertension: Secondary | ICD-10-CM | POA: Diagnosis not present

## 2016-08-12 DIAGNOSIS — Z79899 Other long term (current) drug therapy: Secondary | ICD-10-CM

## 2016-08-12 LAB — POCT GLYCOSYLATED HEMOGLOBIN (HGB A1C): Hemoglobin A1C: 9.7

## 2016-08-12 MED ORDER — PEN NEEDLES 32G X 4 MM MISC: 1.0000 [IU] | Freq: Every day | 4 refills | Status: DC

## 2016-08-12 MED ORDER — TRAZODONE HCL 50 MG PO TABS: ORAL_TABLET | ORAL | 1 refills | Status: DC

## 2016-08-12 MED ORDER — BASAGLAR KWIKPEN 100 UNIT/ML ~~LOC~~ SOPN: PEN_INJECTOR | SUBCUTANEOUS | 3 refills | Status: DC

## 2016-08-12 NOTE — Patient Instructions (Addendum)
Start off by giving yourself 10 units of insulin glargine once a day. Check your blood sugars fasting in the morning, 2 hours after lunch, and before bed. If you have any hypoglycemic episodes, please switch back to your old regimen of metformin and glipizide immediately and stop the insulin as well as letting me know. After several days we will start increasing the dose of insulin until we are getting your fasting morning blood sugars under 150. Aiming for under 120 eventually is ideal. You will increase your insulin dose by 2 units every 3 days until your morning blood sugar is less than 150. This is the way you will find the dose that your body needs. If you are feeling very ill with nausea and vomiting and are not able to eat, you do not want to skip insulin but you would want to give yourself half the dose that day. At some point in the future we will likely add back in the oral meds or transition you back altogether.   IF you received an x-ray today, you will receive an invoice from [facility]. Please contact [facility] at [phone] with questions or concerns regarding your invoice.   IF you received labwork today, you will receive an invoice from [place]. Please contact [facility] at [phone] with questions or concerns regarding your invoice.   Our billing staff will not be able to assist you with questions regarding bills from these companies.  You will be contacted with the lab results as soon as they are available. The fastest way to get your results is to activate your My Chart account. Instructions are located on the last page of this paperwork. If you have not heard from [place] regarding the results in 2 weeks, please contact this office.      How and Where to Give Subcutaneous Insulin Injections, Adult People with type 1 diabetes must take insulin since their bodies do not make it. People with type 2 diabetes may require insulin. There are many different types of  insulin as well as other injectable diabetes medicines that are meant to be injected into the fat layer under your skin. The type of insulin or injectable diabetes medicine you take may determine how many injections you give yourself and when to take the injections. Choosing a site for injection Insulin absorption varies from site to site. As with any injectable medication it is best for the insulin to be injected within the same body region. However, do not inject the insulin in the same spot each time. Rotating the spots you give your injections will prevent inflammation or tissue breakdown. There are four main regions that can be used for injections. The regions include the:  Abdomen (preferred region, especially for non-insulin injectable diabetes medicine).  Front and upper outer sides of thighs.  Back of upper arm.  Buttocks. Using a syringe and vial Drawing up insulin: single insulin dose  1. Wash your hands with soap and water. 2. Gently roll the insulin bottle (vial) between your hands to mix it. Do not shake the vial. 3. Clean the top rubber part of the vial with an alcohol wipe. Be sure that the plastic pop-top has been removed on newer vials. 4. Remove the plastic cover from the needle on the syringe. Do not let the needle touch anything. 5. Pull the plunger back to draw air into the syringe. The air should be the same amount as the insulin dose. 6. Push the needle through the rubber  on the top of the vial. Do not turn the vial over. 7. Push the plunger in all the way to put the air into the vial. 8. Leave the needle in the vial and turn the vial and syringe upside down. 9. Pull down slowly on the plunger, drawing the amount of insulin you need into the syringe. 10. Look for air bubbles in the syringe. You may need to push the plunger up and down 2 to 3 times to slowly get rid of any air bubbles in the syringe. 11. Pull back the plunger to get your correct dose. 12. Remove the  needle from the vial. 13. Use an alcohol wipe to clean the area of the body to be injected. 14. Pinch up 1 inch of skin and hold it. 15. Put the needle straight into the skin (90-degree angle). Put the needle in as far as it will go (to the hub). The needle may need to be injected at a 45-degree angle in small adults with little fat. 16. When the needle is in, you can let go of your skin. 17. Push the plunger down all the way to inject the insulin. 18. Pull the needle straight out of the skin. 19. Press the alcohol wipe over the spot where you gave your injection. Keep it there for a few seconds. Do not rub the area. 20. Do not put the plastic cover back on the needle. Drawing up insulin: mixing 2 insulins 1. Wash your hands with soap and water. 2. Gently roll the vial of "cloudy" insulin between your hands or rotate the vial from top to bottom to mix. 3. Clean the top of both vials with an alcohol wipe. Be sure that the plastic pop-top lid has been removed on newer vials. 4. Pull air into the syringe to equal the dose of "cloudy" insulin. 5. Stick the needle into the "cloudy" insulin vial and inject the air. Be sure to keep the vial upright. 6. Remove the needle from the "cloudy" insulin vial. 7. Pull air into the syringe to equal the dose of "clear" insulin. 8. Stick the needle into the "clear" insulin vial and inject the air. 9. Leave the needle in the "clear" insulin vial and turn the vial upside down. 10. Pull down on the plunger and slowly draw into the syringe the number of units of "clear" insulin desired. 11. Look for air bubbles in the syringe. You may need to push the plunger up and down 2 to 3 times to slowly get rid of any air bubbles in the syringe. 12. Remove the needle from the "clear" insulin vial. 13. Stick the needle into the "cloudy" insulin vial. Do not inject any of the "clear" insulin into the "cloudy" vial. 14. Turn the "cloudy" vial upside down and pull the plunger  down to the number of units that equals the total number of units of "clear" and "cloudy" insulins. 15. Remove the needle from the "cloudy" insulin vial. 16. Use an alcohol wipe to clean the area of the body to be injected. 17. Put the needle straight into the skin (90-degree angle). Put the needle in as far as it will go (to the hub). The needle may need to be injected at a 45-degree angle in small adults with little fat. 18. When the needle is in, you can let go of your skin. 19. Push the plunger down all the way to inject the insulin. 20. Pull the needle straight out of the skin. 21. Press  the alcohol wipe over the spot where you gave your injection. Keep it there for a few seconds. Do not rub the area. 22. Do not put the plastic cover back on the needle. Using an insulin pen  1. Wash your hands with soap and water. 2. If you are using the "cloudy" insulin, roll the pen between your palms several times or rotate the pen top to bottom several times. 3. Remove the insulin pen cap. 4. Clean the rubber stopper of the cartridge with an alcohol wipe. 5. Remove the protective paper tab from the disposable needle. 6. Screw the needle onto the pen. 7. Remove the outer plastic needle cover. 8. Remove the inner plastic needle cover. 9. Prime the insulin pen by turning the button (dial) to 2 units. Hold the pen with the needle pointing up, and push the dial on the opposite end until a drop of insulin appears at the needle tip. If no insulin appears, repeat this step. 10. Dial the number of units of insulin you will inject. 11. Use an alcohol wipe to clean the area of the body to be injected. 12. Pinch up 1 inch of skin and hold it. 13. Put the needle straight into the skin (90-degree angle). 14. Push the dial down to push the insulin into the fat tissue. 15. Count to 10 slowly. Then, remove the needle from the fat tissue. 16. Carefully replace the larger outer plastic needle cover over the needle  and unscrew the capped needle. Throwing away supplies  Discard used needles in a puncture proof sharps disposal container. Follow disposal regulations for the area where you live.  Vials and empty disposable pens may be thrown away in the regular trash. This information is not intended to replace advice given to you by your health care provider. Make sure you discuss any questions you have with your health care provider. Document Released: 09/21/2003 Document Revised: 12/07/2015 Document Reviewed: 12/08/2012 Elsevier Interactive Patient Education  2017 [employer].  Hypoglycemia Hypoglycemia occurs when the level of sugar (glucose) in the blood is too low. Glucose is a type of sugar that provides the body's main source of energy. Certain hormones (insulin and glucagon) control the level of glucose in the blood. Insulin lowers blood glucose, and glucagon increases blood glucose. Hypoglycemia can result from having too much insulin in the bloodstream, or from not eating enough food that contains glucose. Hypoglycemia can happen in people who do or do not have diabetes. It can develop quickly, and it can be a medical emergency. What are the causes? Hypoglycemia occurs most often in people who have diabetes. If you have diabetes, hypoglycemia may be caused by:  Diabetes medicine.  Not eating enough, or not eating often enough.  Increased physical activity.  Drinking alcohol, especially when you have not eaten recently. If you do not have diabetes, hypoglycemia may be caused by:  A tumor in the pancreas. The pancreas is the organ that makes insulin.  Not eating enough, or not eating for long periods at a time (fasting).  Severe infection or illness that affects the liver, heart, or kidneys.  Certain medicines. You may also have reactive hypoglycemia. This condition causes hypoglycemia within 4 hours of eating a meal. This may occur after having stomach surgery. Sometimes, the cause of  reactive hypoglycemia is not known. What increases the risk? Hypoglycemia is more likely to develop in:  People who have diabetes and take medicines to lower blood glucose.  People who abuse alcohol.  People who have a severe illness. What are the signs or symptoms? Hypoglycemia may not cause any symptoms. If you have symptoms, they may include:  Hunger.  Anxiety.  Sweating and feeling clammy.  Confusion.  Dizziness or feeling light-headed.  Sleepiness.  Nausea.  Increased heart rate.  Headache.  Blurry vision.  Seizure.  Nightmares.  Tingling or numbness around the mouth, lips, or tongue.  A change in speech.  Decreased ability to concentrate.  A change in coordination.  Restless sleep.  Tremors or shakes.  Fainting.  Irritability. How is this diagnosed? Hypoglycemia is diagnosed with a blood test to measure your blood glucose level. This blood test is done while you are having symptoms. Your health care provider may also do a physical exam and review your medical history. If you do not have diabetes, other tests may be done to find the cause of your hypoglycemia. How is this treated? This condition can often be treated by immediately eating or drinking something that contains glucose, such as:  3-4 sugar tablets (glucose pills).  Glucose gel, 15-gram tube.  Fruit juice, 4 oz (120 mL).  Regular soda (not diet soda), 4 oz (120 mL).  Low-fat milk, 4 oz (120 mL).  Several pieces of hard candy.  Sugar or honey, 1 Tbsp. Treating Hypoglycemia If You Have Diabetes  If you are alert and able to swallow safely, follow the 15:15 rule:  Take 15 grams of a rapid-acting carbohydrate. Rapid-acting options include:  1 tube of glucose gel.  3 glucose pills.  6-8 pieces of hard candy.  4 oz (120 mL) of fruit juice.  4 oz (120 ml) of regular (not diet) soda.  Check your blood glucose 15 minutes after you take the carbohydrate.  If the repeat blood  glucose level is still at or below 70 mg/dL (3.9 mmol/L), take 15 grams of a carbohydrate again.  If your blood glucose level does not increase above 70 mg/dL (3.9 mmol/L) after 3 tries, seek emergency medical care.  After your blood glucose level returns to normal, eat a meal or a snack within 1 hour. Treating Severe Hypoglycemia  Severe hypoglycemia is when your blood glucose level is at or below 54 mg/dL (3 mmol/L). Severe hypoglycemia is an emergency. Do not wait to see if the symptoms will go away. Get medical help right away. Call your local emergency services (911 in the [place] Do not drive yourself to the hospital. If you have severe hypoglycemia and you cannot eat or drink, you may need an injection of glucagon. A family member or close friend should learn how to check your blood glucose and how to give you a glucagon injection. Ask your health care provider if you need to have an emergency glucagon injection kit available. Severe hypoglycemia may need to be treated in a hospital. The treatment may include getting glucose through an IV tube. You may also need treatment for the cause of your hypoglycemia. Follow these instructions at home: General instructions  Avoid any diets that cause you to not eat enough food. Talk with your health care provider before you start any new diet.  Take over-the-counter and prescription medicines only as told by your health care provider.  Limit alcohol intake to no more than 1 drink per day for nonpregnant women and 2 drinks per day for men. One drink equals 12 oz of beer, 5 oz of wine, or 1 oz of hard liquor.  Keep all follow-up visits as told by your health  care provider. This is important. If You Have Diabetes:   Make sure you know the symptoms of hypoglycemia.  Always have a rapid-acting carbohydrate snack with you to treat low blood sugar.  Follow your diabetes management plan, as told by your health care provider. Make sure you:  Take your  medicines as directed.  Follow your exercise plan.  Follow your meal plan. Eat on time, and do not skip meals.  Check your blood glucose as often as directed. Make sure to check your blood glucose before and after exercise. If you exercise longer or in a different way than usual, check your blood glucose more often.  Follow your sick day plan whenever you cannot eat or drink normally. Make this plan in advance with your health care provider.  Share your diabetes management plan with people in your workplace, school, and household.  Check your urine for ketones when you are ill and as told by your health care provider.  Carry a medical alert card or wear medical alert jewelry. If You Have Reactive Hypoglycemia or Low Blood Sugar From Other Causes:  Monitor your blood glucose as told by your health care provider.  Follow instructions from your health care provider about eating or drinking restrictions. Contact a health care provider if:  You have problems keeping your blood glucose in your target range.  You have frequent episodes of hypoglycemia. Get help right away if:  You continue to have hypoglycemia symptoms after eating or drinking something containing glucose.  Your blood glucose is at or below 54 mg/dL (3 mmol/L).  You have a seizure.  You faint. These symptoms may represent a serious problem that is an emergency. Do not wait to see if the symptoms will go away. Get medical help right away. Call your local emergency services (911 in the [place] Do not drive yourself to the hospital.  This information is not intended to replace advice given to you by your health care provider. Make sure you discuss any questions you have with your health care provider. Document Released: 07/01/2005 Document Revised: 12/13/2015 Document Reviewed: 08/04/2015 Elsevier Interactive Patient Education  2017 [employer]  Insulin Injection Instructions, Using Insulin Pens, Adult A subcutaneous  injection is a shot of medicine that is injected into the layer of fat between skin and muscle. People with type 1 diabetes must take insulin because their bodies do not make it. People with type 2 diabetes may need to take insulin. There are many different types of insulin. The type of insulin that you take may determine how many injections you give yourself and when you need to take the injections. Choosing a site for injection Insulin absorption varies from site to site. It is best to inject insulin within the same body area, using a different spot in that area for each injection. Do not inject the insulin in the same spot for each injection. There are five main areas that can be used for injecting. These areas include:  Abdomen. This is the preferred area.  Front of thigh.  Upper, outer side of thigh.  Back of upper arm.  Buttocks. Using an insulin pen First, follow the steps for Getting Ready, then continue with the steps for Injecting the Insulin. Getting Ready 21. Wash your hands with soap and water. If soap and water are not available, use hand sanitizer. 22. Check the expiration date and type of insulin in the pen. 23. If you are using CLEAR insulin, check to see that it is  clear and free of clumps. 24. If you are using CLOUDY insulin, gently roll the pen between your palms several times, or tip the pen up and down several times to mix up the medicine. Do not shake the pen. 25. Remove the cap from the insulin pen. 26. Use an alcohol wipe to clean the rubber stopper of the pen cartridge. 27. Remove the protective paper tab from the disposable needle. Do not let the needle touch anything. 28. Screw the needle onto the pen. 29. Remove the outer and inner plastic covers from the needle. Do not throw away the outer plastic cover yet. 30. Prime the insulin pen by turning the button (dial) to 2 units. Hold the pen with the needle pointing up, and push the button on the opposite end of the  pen until a drop of insulin appears at the needle tip. If no insulin appears, repeat this step. 31. Dial the number of units of insulin that you will be injecting. Injecting the Insulin  1. Use an alcohol wipe to clean the site where you will be injecting the needle. Let the site air-dry. 2. Hold the pen in the palm of your writing hand with your thumb on the top. 3. If directed by your health care provider, use your other hand to pinch and hold about an inch of skin at the injection site. Do not directly touch the cleaned part of the skin. 4. Gently but quickly, put the needle straight into the skin. The needle should be at a 90-degree angle (perpendicular) to the skin, as if to form the letter "L."  For example, if you are giving an injection in the abdomen, the abdomen forms one "leg" of the "L" and the needle forms the other "leg" of the "L." 5. For adults who have a small amount of body fat, the needle may need to be injected at a 45-degree angle instead. Your health care provider will tell you if this is necessary.  A 45-degree angle looks like the letter "V." 6. When the needle is completely inserted into the skin, use the thumb of your writing hand to push the top button of the pen down all the way to inject the insulin. 7. Let go of the skin that you are pinching. Continue to hold the pen in place with your writing hand. 8. Wait five seconds, then pull the needle straight out of the skin. 9. Carefully put the larger (outer) plastic cover of the needle back over the needle, then unscrew the capped needle and discard it in a sharps container, such as an empty plastic bottle with a cover. 10. Put the plastic cap back on the insulin pen. Throwing away supplies  Discard all used needles in a puncture-proof sharps disposal container. You can ask your local pharmacy about where you can get this kind of disposal container, or you can use an empty liquid laundry detergent bottle that has a  cover.  Follow the disposal regulations for the area where you live. Do not use any needle more than one time.  Throw away empty disposable pens in the regular trash. What questions should I ask my health care provider?  How often should I be taking insulin?  How often should I check my blood glucose?  What amount of insulin should I be taking at each time?  What are the side effects?  What should I do if my blood glucose is too high?  What should I do if my blood  glucose is too low?  What should I do if I forget to take my insulin?  What number should I call if I have questions? Where can I get more information?  American Diabetes Association (ADA): www.diabetes.org  American Association of Diabetes Educators (AADE) Patient Resources: https://www.diabeteseducator.org/patient-resources This information is not intended to replace advice given to you by your health care provider. Make sure you discuss any questions you have with your health care provider. Document Released: 08/04/2015 Document Revised: 12/07/2015 Document Reviewed: 08/04/2015 Elsevier Interactive Patient Education  2017 [employer].

## 2016-08-13 ENCOUNTER — Telehealth: Payer: Self-pay | Admitting: Hematology

## 2016-08-13 LAB — COMPREHENSIVE METABOLIC PANEL
A/G RATIO: 1.2 (ref 1.2–2.2)
ALBUMIN: 4.3 g/dL (ref 3.5–5.5)
ALT: 47 IU/L — ABNORMAL HIGH (ref 0–44)
AST: 52 IU/L — ABNORMAL HIGH (ref 0–40)
Alkaline Phosphatase: 73 IU/L (ref 39–117)
BILIRUBIN TOTAL: 1 mg/dL (ref 0.0–1.2)
BUN / CREAT RATIO: 19 (ref 9–20)
BUN: 19 mg/dL (ref 6–24)
CHLORIDE: 95 mmol/L — AB (ref 96–106)
CO2: 20 mmol/L (ref 18–29)
Calcium: 9.2 mg/dL (ref 8.7–10.2)
Creatinine, Ser: 0.98 mg/dL (ref 0.76–1.27)
GFR calc non Af Amer: 91 mL/min/{1.73_m2} (ref >59)
GFR, EST AFRICAN AMERICAN: 106 mL/min/{1.73_m2} (ref >59)
GLOBULIN, TOTAL: 3.6 g/dL (ref 1.5–4.5)
Glucose: 275 mg/dL — ABNORMAL HIGH (ref 65–99)
POTASSIUM: 4.3 mmol/L (ref 3.5–5.2)
SODIUM: 135 mmol/L (ref 134–144)
TOTAL PROTEIN: 7.9 g/dL (ref 6.0–8.5)

## 2016-08-13 LAB — LIPID PANEL
CHOL/HDL RATIO: 4.5 ratio (ref 0.0–5.0)
Cholesterol, Total: 126 mg/dL (ref 100–199)
HDL: 28 mg/dL — AB (ref >39)
LDL Calculated: 24 mg/dL (ref 0–99)
Triglycerides: 371 mg/dL — ABNORMAL HIGH (ref 0–149)
VLDL Cholesterol Cal: 74 mg/dL — ABNORMAL HIGH (ref 5–40)

## 2016-08-13 LAB — CBC
HEMOGLOBIN: 15.1 g/dL (ref 13.0–17.7)
Hematocrit: 45.2 % (ref 37.5–51.0)
MCH: 32.6 pg (ref 26.6–33.0)
MCHC: 33.4 g/dL (ref 31.5–35.7)
MCV: 98 fL — AB (ref 79–97)
Platelets: 230 10*3/uL (ref 150–379)
RBC: 4.63 x10E6/uL (ref 4.14–5.80)
RDW: 15 % (ref 12.3–15.4)
WBC: 7.6 10*3/uL (ref 3.4–10.8)

## 2016-08-13 LAB — INSULIN AND C-PEPTIDE, SERUM
C PEPTIDE: 5.7 ng/mL — AB (ref 1.1–4.4)
INSULIN: 30.7 u[IU]/mL — AB (ref 2.6–24.9)

## 2016-08-13 LAB — HIV ANTIBODY (ROUTINE TESTING W REFLEX): HIV Screen 4th Generation wRfx: NONREACTIVE

## 2016-08-13 NOTE — Telephone Encounter (Signed)
lvm to inform pt of 2/20 appt date/time per LOS.

## 2016-08-21 ENCOUNTER — Encounter: Payer: Self-pay | Admitting: Internal Medicine

## 2016-08-23 ENCOUNTER — Other Ambulatory Visit: Payer: Self-pay | Admitting: Hematology

## 2016-08-29 ENCOUNTER — Other Ambulatory Visit: Payer: BLUE CROSS/BLUE SHIELD

## 2016-08-30 MED ORDER — CARVEDILOL 12.5 MG PO TABS: 12.5000 mg | ORAL_TABLET | Freq: Two times a day (BID) | ORAL | 1 refills | Status: DC

## 2016-08-30 NOTE — Progress Notes (Signed)
[place]  [phone] [phone]  Clinic Follow up Note   Patient Care Team: Shawnee Knapp, MD as PCP - General (Family Medicine) Michael Boston, MD as Consulting Physician (General Surgery) Fay Records, MD as Consulting Physician (Cardiology) Gatha Mayer, MD as Consulting Physician (Gastroenterology) Alexis Frock, MD as Consulting Physician (Urology) Tania Ade, RN as Registered Nurse Truitt Merle, MD as Consulting Physician (Medical Oncology) 08/08/2016  CHIEF COMPLAINTS: Follow up colon cancer   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   Staging form: Colon and Rectum, AJCC 7th Edition     Pathologic stage from 10/18/2015: Stage IIIA (T2, N1a, cM0) - Signed by Truitt Merle, MD on 11/15/2015       Cancer of left colon ([place])   05/11/2015 Imaging    CT ABD/PELVIS: Gallstones w/sludge;liver prominent without focal liver lesion, mild thickening of bladder wall; negative for cancer      05/18/2015 Imaging    [place] ABD: Negative      07/25/2015 Initial Diagnosis    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 8 mm descending polyp and 5 mm rectosigmoid polyp;18-29 mm proximal rectal polyp      07/25/2015 Tumor Marker    CEA=0.7      07/26/2015 Pathology Results    Sigmoid mass: invasive colorectal adenocarcinoma      10/22/2015 Pathologic Stage    T2, N1, MO    #29 nodes examined w/ 1 postive node; moderately differentiated; Negative for Lymph-vascular and Peri-neural invasion; Microscopic extension into muscularis propria      10/26/2015 Pathology Results    MSI Stable      11/30/2015 - 02/08/2016 Chemotherapy    Adjuvant chemotherapy with Capecitabine 2500 mg twice daily, on  day 1-14, oxaliplatin 130 mg/m, every 21 days      01/19/2016 Genetic Testing    Patient has genetic testing done for young colon cancer. APC [id number] VUS identified on the Colorectal cancer panel.  The Colorectal Cancer Panel offered by [facility] includes sequencing and/or duplication/deletion testing of the following 19 genes: APC, ATM, AXIN2, BMPR1A, CDH1, CHEK2, EPCAM, MLH1, MSH2, MSH6, MUTYH, PMS2, POLD1, POLE, PTEN, SCG5/GREM1, SMAD4, STK11, and TP53.       05/01/2016 Imaging    Surveillance CT CAP scan showed new more focal low-density lesions in the liver, possible metastasis. No other new lesions.      06/05/2016 Imaging    MR ABDOMEN W WO CONTRAST IMPRESSION: 1. 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 2. No additional metastatic disease in the upper abdomen. 3. Cholelithiasis.      06/24/2016 Pathology Results    US Biopsy Diagnosis Liver, needle/core biopsy, right lobe - METASTATIC ADENOCARCINOMA, CONSISTENT WITH COLORECTAL PRIMARY.      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 male is here because of recently diagnosed colon cancer.  He is accompanied by his friend to the clinic today.  He presented with abdominal pain since Oct 2016,  Was  seen in the emergency room on 05/11/2015,  CT abdomen and pelvis showed  Mild thickening of the urinary bladder wall, gallstones, otherwise negative. He was seen by her primary care physician Dr. Brigitte Pulse afterwards, who referred him to GI Dr. Carlean Purl.  His father passed away from [place] cancer around that time,  So his appointment was postponed, and he finally had colonoscopy in early January 2017, which showed multiple polyps, and  A ulcerated mass in distal sigmoid colon.  He was  subsequently referred to colorectal surgeon Dr. Johney Maine, and  Eventually had left colectomy  On 11/01/2015.  He tolerated surgery well, and recovered well. He has good appetite good, BM is normal most of time, occasional diarrhea, likely second to food, mild low abdomen pain, which has improves, he lost about 30 lbs before surgery and 10lbs after surgery.   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  He is divorced, he has a 24 year old daughter who lives with her mother in [place]. He has sisters and brothers, and her mother who live close by.   CURRENT THERAPY:  chemo CAPOX, Every 3 weeks, started on 07/18/2016  INTERIM HISTORY: Juaquin returns for follow-up and chemotherapy. The patient became diaphoretic, nauseous and dizzy at the end of his last treatment, and vomited. He does not recall if he felt any tingling or neuropathy. He reports it took a full day for him to begin remembering things, and experienced dizziness for 4-5 days afterward. Sitting would alleviate the patient's dizziness. He is adamant that he does not want to continue with oxaliplatin. The patient reports he feels tired "all the time," but continues to work full time.    MEDICAL HISTORY:  Past Medical History:  Diagnosis Date  . Cancer [facility])    colon cancer  . Coronary artery disease   . Diabetes mellitus without complication ([place]) 10/1935   oral agents only (07/2015)  . ETOH abuse 2014  . Fatty liver 12/2014   on ultrasound. pt obese and abuses ETOH  . Gallstone 12/2014   GB stones and sludge on ultrasound and CT,  . GERD (gastroesophageal reflux disease)   . Hypertension   . Morbid obesity with BMI of 50.0-59.9, adult ([name]) 03/2013  . Rectal bleeding 05/2015   07/2015 colonoscopy with malignant appearing sigmoid mass. Additional rectal, descending, polyps  . Shoulder pain, bilateral    due to previous injury    SURGICAL HISTORY: Past Surgical History:  Procedure  Laterality Date  . CARDIAC CATHETERIZATION N/A 04/06/2015   Procedure: Left Heart Cath and Coronary Angiography;  Surgeon: Belva Crome, MD;  Location: [facility];  Service: Cardiovascular;  Laterality: N/A;  . COLONOSCOPY N/A 07/25/2015   Procedure: COLONOSCOPY;  Surgeon: Gatha Mayer, MD;  Location: [facility];  Service: Gastroenterology;  Laterality: N/A;  . CYSTOSCOPY  10/18/2015   Procedure: CYSTOSCOPY FLEXIBLE URETHRAL DIALATION AND FOLEY PLACEMENT;  Surgeon: Alexis Frock, MD;  Location: [facility];  Service: Urology;;  . ESOPHAGOGASTRODUODENOSCOPY N/A 07/25/2015   Procedure: ESOPHAGOGASTRODUODENOSCOPY (EGD);  Surgeon: Gatha Mayer, MD;  Location: [facility];  Service: Gastroenterology;  Laterality: N/A;  . PORTACATH PLACEMENT Right 08/01/2016   Procedure: INSERTION PORT-A-CATH WITH ULTRASOUND AND FLUOROSOPIC GUIDANCE - RIGHT INTERNAL JUGULAR;  Surgeon: Michael Boston, MD;  Location: [facility];  Service: General;  Laterality: Right;    SOCIAL HISTORY: Social History   Social History  . Marital status: Legally Separated  Spouse name: N/A  . Number of children: 1  . Years of education: N/A   Occupational History  . Manager    Social History Main Topics  . Smoking status: Never Smoker  . Smokeless tobacco: Current User    Types: Chew     Comment: occasionally dips tobacco - has used for approx 20 yrs (12/21/15)  . Alcohol use Yes     Comment: heavy drinking for 10 years, 2-3  drinks of liqour a night, has been cutting back.    . Drug use: No  . Sexual activity: Not on file   Other Topics Concern  . Not on file   Social History Narrative   Divorced, has 68 yo daughter lives in [place]   Lives alone   Manages chain of car wash dealers       FAMILY HISTORY: Family History  Problem Relation Age of Onset  . Diabetes Father   . Pancreatic cancer Father 77    former smoker, but had not smoked in 3 years  . Cancer Maternal Uncle     d. 59s; unknown type of cancer  -metastatic throughout abdomen   . Other Brother     oldest brother has had a normal colonoscopy  . Heart attack Maternal Grandfather     d. 57-58; heavy smoker and drinker  . Heart attack Paternal Grandmother     d. early 65s  . Heart attack Paternal Grandfather 60  . Heart failure Maternal Uncle   . Skin cancer Maternal Uncle 42    NOS type; was out in the sun a lot  . Brain cancer Maternal Uncle     NOS type; mother's maternal half-brother dx. 29-68  . Cirrhosis Paternal Uncle   . Cancer Paternal Uncle     (x2-3) uncles with NOS cancers; d. 19s    ALLERGIES:  has No Known Allergies.  MEDICATIONS:  Current Outpatient Prescriptions  Medication Sig Dispense Refill  . acetaminophen (TYLENOL) 500 MG tablet Take 1,000 mg by mouth every 6 (six) hours as needed for moderate pain or headache.    . Ascorbic Acid (VITAMIN C) 1000 MG tablet Take 1,000 mg by mouth daily.    Marland Kitchen aspirin EC 81 MG tablet Take 1 tablet (81 mg total) by mouth daily. DO NOT TAKE AGAIN UNTIL 08/09/15    . capecitabine (XELODA) 500 MG tablet Take 5 tablets (2,500 mg total) by mouth 2 (two) times daily after a meal. 140 tablet 2  . carvedilol (COREG) 12.5 MG tablet Take 1 tablet (12.5 mg total) by mouth 2 (two) times daily with a meal. 180 tablet 1  . gabapentin (NEURONTIN) 300 MG capsule Take 1 capsule (300 mg total) by mouth at bedtime. 90 capsule 1  . Insulin Glargine (BASAGLAR KWIKPEN) 100 UNIT/ML SOPN Inject 10u into the skin once a day, titrate up as advised by physician (Patient taking differently: 14 Units. Inject 10u into the skin once a day, titrate up as advised by physician) 15 mL 3  . Insulin Pen Needle (PEN NEEDLES) 32G X 4 MM MISC 1 Units by Does not apply route daily. 90 each 4  . lisinopril-hydrochlorothiazide (PRINZIDE,ZESTORETIC) 20-25 MG tablet TAKE 1 TABLET BY MOUTH DAILY 90 tablet 0  . omeprazole (PRILOSEC) 40 MG capsule TAKE 1 CAPSULE(40 MG) BY MOUTH DAILY 90 capsule 0  . ondansetron (ZOFRAN) 8 MG  tablet Take 1 tablet (8 mg total) by mouth every 8 (eight) hours as needed for nausea or vomiting. 30 tablet 0  .  prochlorperazine (COMPAZINE) 10 MG tablet Take 1 tablet (10 mg total) by mouth every 6 (six) hours as needed for nausea or vomiting. 30 tablet 2  . traZODone (DESYREL) 50 MG tablet Take 1/2 tab po qam and 1.5 tabs po qhs 180 tablet 1  . lidocaine-prilocaine (EMLA) cream Apply 1 application topically as needed. 30 g 2   No current facility-administered medications for this visit.     REVIEW OF SYSTEMS:   Constitutional: Denies fevers, chills or abnormal night sweats (+) fatigue Eyes: Denies blurriness of vision, double vision or watery eyes Ears, nose, mouth, throat, and face: Denies mucositis or sore throat Respiratory: Denies cough, dyspnea or wheezes. Cardiovascular: Denies palpitation, chest discomfort or lower extremity swelling Gastrointestinal:  Denies heartburn or change in bowel habits (+) nausea, vomiting, diarrhea. Skin: Denies abnormal skin rashes Lymphatics: Denies new lymphadenopathy or easy bruising Neurological:Denies numbness, or new weaknesses (+) tingling hands, dizziness following previous cycle Behavioral/Psych: Mood is stable, no new changes  All other systems were reviewed with the patient and are negative.  PHYSICAL EXAMINATION: ECOG PERFORMANCE STATUS: 1  Vitals:   09/03/16 1120  BP: (!) 171/83  Pulse: 84  Resp: 17  Temp: 98.4 F (36.9 C)   Filed Weights   09/03/16 1120  Weight: (!) 390 lb 4.8 oz (177 kg)   GENERAL:alert,  Morbidly obese. SKIN: skin color, texture, turgor are normal, no rashes or significant lesions EYES: normal, conjunctiva are pink and non-injected, sclera clear OROPHARYNX:no exudate, no erythema and lips, buccal mucosa, and tongue normal  NECK: supple, thyroid normal size, non-tender, without nodularity LYMPH:  no palpable lymphadenopathy in the cervical, axillary or inguinal LUNGS: clear to auscultation and percussion  with normal breathing effort HEART: regular rate & rhythm and no murmurs and no lower extremity edema ABDOMEN: abdomen soft,  Surgical incision and laparoscopic scars are well-healed. non-tender and normal bowel sounds  Musculoskeletal: no cyanosis of digits and no clubbing  PSYCH: alert & oriented x 3 with fluent speech NEURO: no focal motor/sensory deficits  LABORATORY DATA:  I have reviewed the data as listed CBC Latest Ref Rng & Units 09/03/2016 08/12/2016 08/08/2016  WBC 4.0 - 10.3 10e3/uL 9.0 7.6 10.3  Hemoglobin 13.0 - 17.1 g/dL 13.5 - 14.6  Hematocrit 38.4 - 49.9 % 39.5 45.2 42.2  Platelets 140 - 400 10e3/uL 175 230 227    CMP Latest Ref Rng & Units 09/03/2016 08/12/2016 08/08/2016  Glucose 70 - 140 mg/dl 272(H) 275(H) 334(H)  BUN 7.0 - 26.0 mg/dL 14.3 19 22(H)  Creatinine 0.7 - 1.3 mg/dL 1.1 0.98 0.96  Sodium 136 - 145 mEq/L 135(L) 135 132(L)  Potassium 3.5 - 5.1 mEq/L 4.1 4.3 4.1  Chloride 96 - 106 mmol/L - 95(L) 99(L)  CO2 22 - 29 mEq/L 24 20 20(L)  Calcium 8.4 - 10.4 mg/dL 9.5 9.2 9.0  Total Protein 6.4 - 8.3 g/dL 7.6 7.9 7.8  Total Bilirubin 0.20 - 1.20 mg/dL 1.14 1.0 1.3(H)  Alkaline Phos 40 - 150 U/L 73 73 75  AST 5 - 34 U/L 19 52(H) 52(H)  ALT 0 - 55 U/L 20 47(H) 29   CEA:  07/25/2015: 0.7 11/30/2015: 4.5 05/01/2016: 57.89 (64.7)  07/18/2016: 329.87 (310.0) 09/03/2016: 589  PATHOLOGY REPORT: US Biopsy 06/24/2016 Diagnosis Liver, needle/core biopsy, right lobe - METASTATIC ADENOCARCINOMA, CONSISTENT WITH COLORECTAL PRIMARY. - SEE COMMENT. Microscopic Comment Dr. Burr Medico was paged on 06/25/16. Per request, a block will be sent for Foundation 1 testing and the results reported separately. (JBK:gt,  06/25/16)   Diagnosis 10/18/2015 1. Colon, segmental resection for tumor, rectosigmoid INFILTRATIVE MODERATELY DIFFERENTIATED ADENOCARCINOMA OF THE COLON (3.8 CM) THE TUMOR INVADES MUSCULARIS PROPRIA MARGINS OF RESECTION ARE NEGATIVE FOR TUMOR METASTATIC COLONIC  ADENOCARCINOMA IN ONE OF TWENTY-NINE LYMPH NODES (1/29) 2. Colon, resection margin (donut), final distal margin BENIGN COLONIC TISSUE, NEGATIVE FOR CARCINOMA Microscopic Comment 1. COLON AND RECTUM (INCLUDING TRANS-ANAL RESECTION): Specimen: Rectosigmoid colon Procedure: Segmental resection Tumor site: Rectal colon Specimen integrity: Intact Macroscopic intactness of mesorectum: Not applicable: NA Complete: NA Near complete: x Incomplete: NA Cannot be determined (specify): NA Macroscopic tumor perforation: Muscularis propria Invasive tumor: Maximum size: 3.8 cm Histologic type(s): Adenocarcinoma Histologic grade and differentiation: G2 G1: well differentiated/low grade G2: moderately differentiated/low grade G3: poorly differentiated/high grade G4: undifferentiated/high grade Type of polyp in which invasive carcinoma arose: Tubular adenoma Microscopic extension of invasive tumor:Muscularis propria Lymph-Vascular invasion: Negative Peri-neural invasion: Negative Tumor deposit(s) (discontinuous extramural extension): Negative Resection margins: Proximal margin: Negative Distal margin: Negative Circumferential (radial) (posterior ascending, posterior descending; lateral and posterior mid-rectum; and entire lower 1/3 rectum):Negative Mesenteric margin (sigmoid and transverse): Negative Distance closest margin (if all above margins negative): 4.8 cm Trans-anal resection margins only: Deep margin: NA Mucosal Margin: NA Distance closest mucosal margin (if negative): NA Treatment effect (neo-adjuvant therapy): NA Additional polyp(s): Hyperplastic poly Non-neoplastic findings: Unremarkable Lymph nodes: number examined 29; number positive: 1 Pathologic Staging: T2, N1, M0   Mismatch Repair (MMR) Protein Immunohistochemistry (IHC) IHC Expression Result: MLH1: Preserved nuclear expression (greater 50% tumor expression) MSH2: Preserved nuclear expression (greater 50% tumor  expression) MSH6: Preserved nuclear expression (greater 50% tumor expression) PMS2: Preserved nuclear expression (greater 50% tumor expression) * Internal control demonstrates intact nuclear expression Interpretation: NORMAL     Diagnosis 07/25/2015 1. Colon, polyp(s), descending - TUBULAR ADENOMA. NO HIGH GRADE DYSPLASIA OR MALIGNANCY IDENTIFIED. 2. Colon, biopsy, distal sigmoid mass - INVASIVE COLORECTAL ADENOCARCINOMA. 3. Colon, polyp(s), small recto sigmoid - HYPERPLASTIC POLYP. NO ADENOMATOUS CHANGE OR MALIGNANCY. 4. Rectum, polyp(s) - TUBULOVILLOUS ADENOMA. NO HIGH GRADE DYSPLASIA OR MALIGNANCY IDENTIFIED.   RADIOGRAPHIC STUDIES: I have personally reviewed the radiological images as listed and agreed with the findings in the report.  CT chest, abdomen and pelvis w contrast 05/01/2016  IMPRESSION: 1. Stable chest CT.  No evidence of thoracic metastatic disease. 2. Progressive heterogeneity of the liver could be due to heterogeneous steatosis. However, there are new more focal low-density lesions which are worrisome for possible metastatic disease given the patient's history. Further evaluation with hepatic MRI recommended. 3. Resolving postsurgical retroperitoneal fluid collections status post partial colon resection and anastomosis.  Abdominal MRI w wo contrast 06/05/2016 IMPRESSION: 1. 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 2. No additional metastatic disease in the upper abdomen. 3. Cholelithiasis.   COLONOSCOPY 07/25/2015 ENDOSCOPIC IMPRESSION: 1) 1/4 circumference firm ulcerated mass in distal sigmmoid - approx 22 cm from verge. Looks like cancer. Biopsied. 2) 8 mm descending polyp and 5 mm rectosigmoid polyp removed cold snare, completely recovered and sent to path. 3) 18-20 mm pedunculated proximal rectal polyp removed hot snare and completely recovered for pathology. 4) Otherwise normal colonoscopy RECOMMENDATIONS: 1. Hold  Aspirin and all other NSAIDS for 2 weeks. 2. Will call pathology results and plans though anticipate surgical referral after pathology in. CEA will be drawn at hospital today. He has had recent CT's of chest, and abd/pelvis - will see if he needs new ones - he may.  EGD 07/25/2015 ENDOSCOPIC IMPRESSION: Normal appearing esophagus and GE junction,  the stomach was well visualized and normal in appearance, normal appearing duodenum  ASSESSMENT & PLAN:  46 y.o.  Caucasian male, with past medical history of heavy alcohol drinking, hypertension, diabetes, coronary artery disease, presented with abdominal pain.  1.  Cancer of left colon, Distal sigmoid, invasive adenocarcinoma, G2, pT2N1aM0, stage IIIA, MSI-stable, liver metastasis in 04/2016 -I initially reviewed his CT scan findings, and the surgical pathology findings include details with patient. -he has locally advanced sigmoid colon cancer, with one out of 29 lymph nodes positive. -He has completed 3 months of adjuvant chemotherapy CAPOX, tolerated well. -He unfortunately developed metastatic disease right after he completes adjuvant chemotherapy. MRI confirmed at least a 6 metastatic lesions in the liver, largest 4.2 cm. Giving the multiple liver metastasis, this is likely incurable metastatic cancer. -He underwent ultrasound-guided liver mass biopsy to confirm metastasis, which revealed metastatic adenocarcinoma consistent with a colorectal primary.  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 He is not a candidate for immunotherapy, and would not benefit from EGFR inhibitor. -He is on first-line chemotherapy CAPOX and avastin, unfortunately he developed severe infusion reaction to oxaliplatin last cycle, we'll hold oxaliplatin. -He CEA has been trending up, suspicious for disease progression. I'll obtain a restaging CT scan in the next few weeks -I will change his chemotherapy to FOLFIRI and Avastin from next cycle     --Chemotherapy consent: Side effects including but does not not limited to, fatigue, nausea, vomiting, diarrhea, hair loss, neuropathy, fluid retention, renal and kidney dysfunction, neutropenic fever, needed for blood transfusion, bleeding, were discussed with patient in great detail. He agrees to proceed. -Lab reviewed, we'll proceed with Avastin and Xeloda for this cycle.   2. Genetics -per NCCN guideline, due to his young age and family history of pancreatic cancer, he is qualified for genetic testing to ruled out inheritable genetic syndromes. -His genetic testing was negative. APC VUS called [id number] found on the colorectal cancer panel.  The Colorectal Cancer Panel offered by [facility] includes sequencing and/or duplication/deletion testing of the following 19 genes: APC, ATM, AXIN2, BMPR1A, CDH1, CHEK2, EPCAM, MLH1, MSH2, MSH6, MUTYH, PMS2, POLD1, POLE, PTEN, SCG5/GREM1, SMAD4, STK11, and TP53.  3. HTN, DM, CAD, morbid obesity -he will continue follow-up with his primary care physician -We will monitor his blood pressure, glucose closely during the chemotherapy, and may need to adjust his the medication if needed. -I strongly encouraged him to see Dr. Brigitte Pulse to adjust his diabetic medication due to his uncontrolled hyperglycemia and hypertension --his BG again is high, 272 today, he will refill his glipizide, and monitor his BG at home. -We previously discussed his premedication dexamethasone will increase his blood sugar, diabetic diet discussed again.  4. History of alcohol abuse -He has stopped alcohol completely since he started chemotherapy.   5. 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  -Near resolved now  6. Coping and social issues  -The patient is having a hard time with his diagnosis.  -I previously offered social work to help whenever he is in need of it. He declined at that time.  7. Hyperglycemia  -His blood glucose is high today, 272.  -He checks  at home. In the mornings it is high. Better in the night. -Previously encouraged him to eat healthy. He has stopped eating fast food and ice cream.  -He began insulin 3 weeks ago, per Dr. Brigitte Pulse. I encouraged the patient to follow up with Dr. Brigitte Pulse if the patient's blood sugar is uncontrolled.  8.  Goal of care discussion  -We again discussed the incurable nature of his cancer, and the overall poor prognosis, especially if he does not have good response to chemotherapy or progress on chemo -The patient understands the goal of care is palliative. -I recommend DNR/DNI, he will think about it    Plan -Lab reviewed; The patient will receive infusion Avastin today, and start Xeloda tomorrow. -I will refill the patient's Emla cream today. -The patient will follow up in 3 weeks with CT scan beforehand. He will switch to FOLFIRI and avastin from next cycle   All questions were answered. The patient knows to call the clinic with any problems, questions or concerns.  I spent 20 minutes counseling the patient face to face. The total time spent in the appointment was 25 minutes and more than 50% was on counseling.  This document serves as a record of services personally performed by Truitt Merle, MD. It was created on her behalf by Maryla Morrow, a trained medical scribe. The creation of this record is based on the scribe's personal observations and the provider's statements to them. This document has been checked and approved by the attending provider.  I have reviewed the above documentation for accuracy and completeness, and I agree with the above information.   Truitt Merle, MD 09/03/2016

## 2016-09-03 ENCOUNTER — Ambulatory Visit (HOSPITAL_BASED_OUTPATIENT_CLINIC_OR_DEPARTMENT_OTHER): Payer: BLUE CROSS/BLUE SHIELD | Admitting: Hematology

## 2016-09-03 ENCOUNTER — Telehealth: Payer: Self-pay | Admitting: *Deleted

## 2016-09-03 ENCOUNTER — Other Ambulatory Visit (HOSPITAL_BASED_OUTPATIENT_CLINIC_OR_DEPARTMENT_OTHER): Payer: BLUE CROSS/BLUE SHIELD

## 2016-09-03 ENCOUNTER — Ambulatory Visit: Payer: BLUE CROSS/BLUE SHIELD

## 2016-09-03 ENCOUNTER — Ambulatory Visit (HOSPITAL_BASED_OUTPATIENT_CLINIC_OR_DEPARTMENT_OTHER): Payer: BLUE CROSS/BLUE SHIELD

## 2016-09-03 ENCOUNTER — Encounter: Payer: Self-pay | Admitting: Hematology

## 2016-09-03 ENCOUNTER — Telehealth: Payer: Self-pay | Admitting: Hematology

## 2016-09-03 VITALS — BP 151/86

## 2016-09-03 VITALS — BP 171/83 | HR 84 | Temp 98.4°F | Resp 17 | Ht 75.0 in | Wt 390.3 lb

## 2016-09-03 DIAGNOSIS — C186 Malignant neoplasm of descending colon: Secondary | ICD-10-CM

## 2016-09-03 DIAGNOSIS — E1165 Type 2 diabetes mellitus with hyperglycemia: Secondary | ICD-10-CM

## 2016-09-03 DIAGNOSIS — I10 Essential (primary) hypertension: Secondary | ICD-10-CM

## 2016-09-03 DIAGNOSIS — Z95828 Presence of other vascular implants and grafts: Secondary | ICD-10-CM

## 2016-09-03 DIAGNOSIS — C787 Secondary malignant neoplasm of liver and intrahepatic bile duct: Secondary | ICD-10-CM | POA: Diagnosis not present

## 2016-09-03 DIAGNOSIS — G620 Drug-induced polyneuropathy: Secondary | ICD-10-CM

## 2016-09-03 DIAGNOSIS — C19 Malignant neoplasm of rectosigmoid junction: Secondary | ICD-10-CM

## 2016-09-03 DIAGNOSIS — I2510 Atherosclerotic heart disease of native coronary artery without angina pectoris: Secondary | ICD-10-CM

## 2016-09-03 DIAGNOSIS — Z7189 Other specified counseling: Secondary | ICD-10-CM

## 2016-09-03 DIAGNOSIS — Z5112 Encounter for antineoplastic immunotherapy: Secondary | ICD-10-CM | POA: Diagnosis not present

## 2016-09-03 DIAGNOSIS — F1021 Alcohol dependence, in remission: Secondary | ICD-10-CM

## 2016-09-03 LAB — CEA (IN HOUSE-CHCC): CEA (CHCC-In House): 589.27 ng/mL — ABNORMAL HIGH (ref 0.00–5.00)

## 2016-09-03 LAB — COMPREHENSIVE METABOLIC PANEL
ALT: 20 U/L (ref 0–55)
AST: 19 U/L (ref 5–34)
Albumin: 3.5 g/dL (ref 3.5–5.0)
Alkaline Phosphatase: 73 U/L (ref 40–150)
Anion Gap: 11 mEq/L (ref 3–11)
BUN: 14.3 mg/dL (ref 7.0–26.0)
CHLORIDE: 99 meq/L (ref 98–109)
CO2: 24 meq/L (ref 22–29)
Calcium: 9.5 mg/dL (ref 8.4–10.4)
Creatinine: 1.1 mg/dL (ref 0.7–1.3)
EGFR: 84 mL/min/{1.73_m2} — ABNORMAL LOW (ref >90)
GLUCOSE: 272 mg/dL — AB (ref 70–140)
Potassium: 4.1 mEq/L (ref 3.5–5.1)
Sodium: 135 mEq/L — ABNORMAL LOW (ref 136–145)
Total Bilirubin: 1.14 mg/dL (ref 0.20–1.20)
Total Protein: 7.6 g/dL (ref 6.4–8.3)

## 2016-09-03 LAB — CBC WITH DIFFERENTIAL/PLATELET
BASO%: 0.1 % (ref 0.0–2.0)
Basophils Absolute: 0 10*3/uL (ref 0.0–0.1)
EOS ABS: 0.2 10*3/uL (ref 0.0–0.5)
EOS%: 2.6 % (ref 0.0–7.0)
HCT: 39.5 % (ref 38.4–49.9)
HGB: 13.5 g/dL (ref 13.0–17.1)
LYMPH%: 22.3 % (ref 14.0–49.0)
MCH: 32.5 pg (ref 27.2–33.4)
MCHC: 34.2 g/dL (ref 32.0–36.0)
MCV: 95.2 fL (ref 79.3–98.0)
MONO#: 1.2 10*3/uL — ABNORMAL HIGH (ref 0.1–0.9)
MONO%: 13.3 % (ref 0.0–14.0)
NEUT#: 5.6 10*3/uL (ref 1.5–6.5)
NEUT%: 61.7 % (ref 39.0–75.0)
PLATELETS: 175 10*3/uL (ref 140–400)
RBC: 4.15 10*6/uL — AB (ref 4.20–5.82)
RDW: 17 % — ABNORMAL HIGH (ref 11.0–14.6)
WBC: 9 10*3/uL (ref 4.0–10.3)
lymph#: 2 10*3/uL (ref 0.9–3.3)

## 2016-09-03 MED ORDER — SODIUM CHLORIDE 0.9% FLUSH
10.0000 mL | INTRAVENOUS | Status: DC | PRN
  Administered 2016-09-03: 10 mL
  Filled 2016-09-03: qty 10

## 2016-09-03 MED ORDER — SODIUM CHLORIDE 0.9 % IV SOLN
Freq: Once | INTRAVENOUS | Status: AC
  Administered 2016-09-03: 13:00:00 via INTRAVENOUS

## 2016-09-03 MED ORDER — SODIUM CHLORIDE 0.9% FLUSH
10.0000 mL | INTRAVENOUS | Status: DC | PRN
  Administered 2016-09-03: 10 mL via INTRAVENOUS
  Filled 2016-09-03: qty 10

## 2016-09-03 MED ORDER — HEPARIN SOD (PORK) LOCK FLUSH 100 UNIT/ML IV SOLN
500.0000 [IU] | Freq: Once | INTRAVENOUS | Status: AC | PRN
  Administered 2016-09-03: 500 [IU]
  Filled 2016-09-03: qty 5

## 2016-09-03 MED ORDER — SODIUM CHLORIDE 0.9 % IV SOLN
15.0000 mg/kg | Freq: Once | INTRAVENOUS | Status: AC
  Administered 2016-09-03: 2700 mg via INTRAVENOUS
  Filled 2016-09-03: qty 96

## 2016-09-03 MED ORDER — LIDOCAINE-PRILOCAINE 2.5-2.5 % EX CREA: 1.0000 "application " | TOPICAL_CREAM | CUTANEOUS | 2 refills | Status: AC | PRN

## 2016-09-03 NOTE — Telephone Encounter (Signed)
Per 2/20 LOs and staff message I have scheduled appt. Notified the scheduler

## 2016-09-03 NOTE — Patient Instructions (Signed)
[facility] Discharge Instructions for Patients Receiving Chemotherapy  Today you received the following chemotherapy agents Avastin   To help prevent nausea and vomiting after your treatment, we encourage you to take your nausea medication as directed.    If you develop nausea and vomiting that is not controlled by your nausea medication, call the clinic.   BELOW ARE SYMPTOMS THAT SHOULD BE REPORTED IMMEDIATELY:  *FEVER GREATER THAN 100.5 F  *CHILLS WITH OR WITHOUT FEVER  NAUSEA AND VOMITING THAT IS NOT CONTROLLED WITH YOUR NAUSEA MEDICATION  *UNUSUAL SHORTNESS OF BREATH  *UNUSUAL BRUISING OR BLEEDING  TENDERNESS IN MOUTH AND THROAT WITH OR WITHOUT PRESENCE OF ULCERS  *URINARY PROBLEMS  *BOWEL PROBLEMS  UNUSUAL RASH Items with * indicate a potential emergency and should be followed up as soon as possible.  Feel free to call the clinic you have any questions or concerns. The clinic phone number is [phone].  Please show the CHEMO ALERT CARD at check-in to the Emergency Department and triage nurse.   

## 2016-09-03 NOTE — Telephone Encounter (Signed)
Appointments scheduled per 2/20 LOS. Patient given AVS report and calendars with future scheduled appointments. °

## 2016-09-07 DIAGNOSIS — Z7189 Other specified counseling: Secondary | ICD-10-CM | POA: Insufficient documentation

## 2016-09-07 NOTE — Progress Notes (Signed)
DISCONTINUE ON PATHWAY REGIMEN - Colorectal     A cycle is every 14 days:     Oxaliplatin        Dose Mod: None     Leucovorin        Dose Mod: None     5-Fluorouracil        Dose Mod: None     5-Fluorouracil        Dose Mod: None     Bevacizumab        Dose Mod: None  **Always confirm dose/schedule in your pharmacy ordering system**    REASON: Toxicities / Adverse Event PRIOR TREATMENT: [id number]: [id number] + Bevacizumab TREATMENT RESPONSE: Progressive Disease (PD)  START ON PATHWAY REGIMEN - Colorectal     A cycle is every 14 days:     Irinotecan        Dose Mod: None     Leucovorin        Dose Mod: None     5-Fluorouracil        Dose Mod: None     5-Fluorouracil        Dose Mod: None     Bevacizumab        Dose Mod: None  **Always confirm dose/schedule in your pharmacy ordering system**    Patient Characteristics: Metastatic Colorectal, Second Line, KRAS Mutation Positive/Unknown, BRAF Wild-Type/Unknown, Bevacizumab Eligible 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 AJCC 8 Stage Grouping: IVA BRAF Mutation Status: Wild Type (no mutation) KRAS/NRAS Mutation Status: Mutation Positive Line of therapy: Second Line Would you be surprised if this patient died  in the next year? I would be surprised if this patient died in the next year  Intent of Therapy: Non-Curative / Palliative Intent, Discussed with Patient

## 2016-09-17 ENCOUNTER — Other Ambulatory Visit: Payer: BLUE CROSS/BLUE SHIELD

## 2016-09-19 ENCOUNTER — Ambulatory Visit: Payer: BLUE CROSS/BLUE SHIELD

## 2016-09-19 ENCOUNTER — Ambulatory Visit: Payer: BLUE CROSS/BLUE SHIELD | Admitting: Hematology

## 2016-09-19 ENCOUNTER — Other Ambulatory Visit: Payer: BLUE CROSS/BLUE SHIELD

## 2016-09-23 ENCOUNTER — Other Ambulatory Visit: Payer: Self-pay

## 2016-09-23 DIAGNOSIS — C787 Secondary malignant neoplasm of liver and intrahepatic bile duct: Secondary | ICD-10-CM

## 2016-09-23 DIAGNOSIS — C189 Malignant neoplasm of colon, unspecified: Secondary | ICD-10-CM

## 2016-09-23 DIAGNOSIS — C186 Malignant neoplasm of descending colon: Secondary | ICD-10-CM

## 2016-09-24 ENCOUNTER — Ambulatory Visit: Payer: BLUE CROSS/BLUE SHIELD

## 2016-09-24 ENCOUNTER — Other Ambulatory Visit: Payer: BLUE CROSS/BLUE SHIELD

## 2016-09-24 ENCOUNTER — Telehealth: Payer: Self-pay | Admitting: *Deleted

## 2016-09-24 ENCOUNTER — Telehealth: Payer: Self-pay | Admitting: Hematology

## 2016-09-24 ENCOUNTER — Ambulatory Visit: Payer: BLUE CROSS/BLUE SHIELD | Admitting: Hematology

## 2016-09-24 NOTE — Telephone Encounter (Signed)
Patient called and cancelled appointment for 3/13 and needs to reschedule due to inclement weather

## 2016-09-24 NOTE — Telephone Encounter (Signed)
Pt was  NO  SHOW  For appts today.  Called pt at home and left message on voice mail requesting a call back to collaborative nurse.

## 2016-09-24 NOTE — Telephone Encounter (Signed)
Called patient to r/s appt due to weather . Patient is aware of new time and date on 3/21.

## 2016-09-26 ENCOUNTER — Other Ambulatory Visit: Payer: Self-pay | Admitting: Hematology

## 2016-09-27 NOTE — Progress Notes (Signed)
[place]  [phone] Fax:(336) [phone]  Clinic Follow up Note   Patient Care Team: Shawnee Knapp, MD as PCP - General (Family Medicine) Michael Boston, MD as Consulting Physician (General Surgery) Fay Records, MD as Consulting Physician (Cardiology) Gatha Mayer, MD as Consulting Physician (Gastroenterology) Alexis Frock, MD as Consulting Physician (Urology) Tania Ade, RN as Registered Nurse Chad Merle, MD as Consulting Physician (Medical Oncology) 10/02/2016  CHIEF COMPLAINTS: Follow up colon cancer   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   Staging form: Colon and Rectum, AJCC 7th Edition     Pathologic stage from 10/18/2015: Stage IIIA (T2, N1a, cM0) - Signed by Chad Merle, MD on 11/15/2015       Cancer of left colon ([place])   05/11/2015 Imaging    CT ABD/PELVIS: Gallstones w/sludge;liver prominent without focal liver lesion, mild thickening of bladder wall; negative for cancer      05/18/2015 Imaging    [place] ABD: Negative      07/25/2015 Initial Diagnosis    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 8 mm descending polyp and 5 mm rectosigmoid polyp;18-29 mm proximal rectal polyp      07/25/2015 Tumor Marker    CEA=0.7      07/26/2015 Pathology Results    Sigmoid mass: invasive colorectal adenocarcinoma      10/22/2015 Pathologic Stage    T2, N1, MO    #29 nodes examined w/ 1 postive node; moderately differentiated; Negative for Lymph-vascular and Peri-neural invasion; Microscopic extension into muscularis propria      10/26/2015 Pathology Results    MSI Stable      11/30/2015 - 02/08/2016 Chemotherapy    Adjuvant chemotherapy with Capecitabine 2500 mg twice daily, on  day 1-14, oxaliplatin 130 mg/m, every 21 days      01/19/2016 Genetic Testing    Patient has genetic testing done for young colon cancer. APC [id number] VUS identified on the Colorectal cancer panel.  The Colorectal Cancer Panel offered by [facility] includes sequencing and/or duplication/deletion testing of the following 19 genes: APC, ATM, AXIN2, BMPR1A, CDH1, CHEK2, EPCAM, MLH1, MSH2, MSH6, MUTYH, PMS2, POLD1, POLE, PTEN, SCG5/GREM1, SMAD4, STK11, and TP53.       05/01/2016 Imaging    Surveillance CT CAP scan showed new more focal low-density lesions in the liver, possible metastasis. No other new lesions.      06/05/2016 Imaging    MR ABDOMEN W WO CONTRAST IMPRESSION: 1. 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 2. No additional metastatic disease in the upper abdomen. 3. Cholelithiasis.      06/24/2016 Pathology Results    US Biopsy Diagnosis Liver, needle/core biopsy, right lobe - METASTATIC ADENOCARCINOMA, CONSISTENT WITH COLORECTAL PRIMARY.      07/16/2016 Miscellaneous    Foundation One results received      07/18/2016 - 09/03/2016 Chemotherapy    Chemo CAPOX, every 3 weeks, started on 07/18/2016 and added Avastin, every 3 weeks, on 08/08/16. Oxaplatin held since 09/03/16 due to severe infusion reaction. Patient was to hold Xeloda from 08/09/16 and restart on 09/04/16.       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 male is here because of recently diagnosed colon cancer.  He is accompanied by his friend to the clinic today.  He presented with abdominal pain since Oct 2016,  Was seen in the emergency room on 05/11/2015,  CT abdomen and pelvis showed  Mild thickening of the urinary bladder wall, gallstones, otherwise negative. He was seen by her primary care physician Dr. Brigitte Pulse afterwards, who referred him  to GI Dr. Carlean Purl.  His father passed away from [place] cancer around that time,  So his appointment was postponed, and he finally had colonoscopy in early January 2017, which showed multiple polyps, and  A ulcerated mass in distal sigmoid colon.  He was subsequently referred to colorectal surgeon Dr. Johney Maine, and  Eventually had left colectomy  On 11/01/2015.  He tolerated surgery well, and recovered well. He has good appetite good, BM is normal most of time, occasional diarrhea, likely second to food, mild low abdomen pain, which has improves, he lost about 30 lbs before surgery and 10lbs after surgery.   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  He is divorced, he has a 35 year old daughter who lives with her mother in [place]. He has sisters and brothers, and her mother who live close by.   CURRENT THERAPY:  Chemotherapy FOLFIRI and Avastin starting 10/02/16, every 2 weeks   INTERIM HISTORY: [name] returns for follow-up and his first cycle of FOLFIRI and Avastin chemotherapy. He was seen in the infusion room with his mother. The patient reports calling scheduling about his CT scan, but he was not placed on the schedule. The patient reports having a right upper tooth pulled due to an abscess. He has completed antibiotics. He report he regularly checks his blood sugar. He reports it is usually 170 in the mornings before he eats. He reports constipation over the past couple of days. He has not taken any medication for it. He reports drinking adequate fluids and he denies lower extremity edema. He reports some nausea.    MEDICAL HISTORY:  Past Medical History:  Diagnosis Date  . Cancer [facility])    colon cancer  . Coronary artery disease   . Diabetes mellitus without complication ([place]) 01/4080   oral agents only (07/2015)  . ETOH abuse 2014  . Fatty liver 12/2014   on ultrasound. pt obese and abuses ETOH  . Gallstone 12/2014   GB stones and sludge on  ultrasound and CT,  . GERD (gastroesophageal reflux disease)   . Hypertension   . Morbid obesity with BMI of 50.0-59.9, adult ([place]) 03/2013  . Rectal bleeding 05/2015   07/2015 colonoscopy with malignant appearing sigmoid mass. Additional rectal, descending, polyps  . Shoulder pain, bilateral    due to previous injury    SURGICAL HISTORY: Past Surgical History:  Procedure Laterality Date  . CARDIAC CATHETERIZATION N/A 04/06/2015   Procedure: Left Heart Cath and Coronary Angiography;  Surgeon: Belva Crome, MD;  Location: [facility];  Service: Cardiovascular;  Laterality: N/A;  . COLONOSCOPY N/A 07/25/2015   Procedure: COLONOSCOPY;  Surgeon: Gatha Mayer, MD;  Location: [facility];  Service: Gastroenterology;  Laterality: N/A;  . CYSTOSCOPY  10/18/2015   Procedure: CYSTOSCOPY FLEXIBLE URETHRAL DIALATION AND FOLEY PLACEMENT;  Surgeon: Alexis Frock, MD;  Location: [facility];  Service: Urology;;  . ESOPHAGOGASTRODUODENOSCOPY N/A 07/25/2015   Procedure:  ESOPHAGOGASTRODUODENOSCOPY (EGD);  Surgeon: Gatha Mayer, MD;  Location: [facility];  Service: Gastroenterology;  Laterality: N/A;  . PORTACATH PLACEMENT Right 08/01/2016   Procedure: INSERTION PORT-A-CATH WITH ULTRASOUND AND FLUOROSOPIC GUIDANCE - RIGHT INTERNAL JUGULAR;  Surgeon: Michael Boston, MD;  Location: [facility];  Service: General;  Laterality: Right;    SOCIAL HISTORY: Social History   Social History  . Marital status: Legally Separated    Spouse name: N/A  . Number of children: 1  . Years of education: N/A   Occupational History  . Manager    Social History Main Topics  . Smoking status: Never Smoker  . Smokeless tobacco: Current User    Types: Chew     Comment: occasionally dips tobacco - has used for approx 20 yrs (12/21/15)  . Alcohol use Yes     Comment: heavy drinking for 10 years, 2-3  drinks of liqour a night, has been cutting back.    . Drug use: No  . Sexual activity: Not on file   Other Topics Concern    . Not on file   Social History Narrative   Divorced, has 81 yo daughter lives in [place]   Lives alone   Manages chain of car wash dealers       FAMILY HISTORY: Family History  Problem Relation Age of Onset  . Diabetes Father   . Pancreatic cancer Father 42    former smoker, but had not smoked in 32 years  . Cancer Maternal Uncle     d. 64s; unknown type of cancer -metastatic throughout abdomen   . Other Brother     oldest brother has had a normal colonoscopy  . Heart attack Maternal Grandfather     d. 57-58; heavy smoker and drinker  . Heart attack Paternal Grandmother     d. early 47s  . Heart attack Paternal Grandfather 43  . Heart failure Maternal Uncle   . Skin cancer Maternal Uncle 42    NOS type; was out in the sun a lot  . Brain cancer Maternal Uncle     NOS type; mother's maternal half-brother dx. 37-68  . Cirrhosis Paternal Uncle   . Cancer Paternal Uncle     (x2-3) uncles with NOS cancers; d. 71s    ALLERGIES:  has No Known Allergies.  MEDICATIONS:  Current Outpatient Prescriptions  Medication Sig Dispense Refill  . acetaminophen (TYLENOL) 500 MG tablet Take 1,000 mg by mouth every 6 (six) hours as needed for moderate pain or headache.    . Ascorbic Acid (VITAMIN C) 1000 MG tablet Take 1,000 mg by mouth daily.    Marland Kitchen aspirin EC 81 MG tablet Take 1 tablet (81 mg total) by mouth daily. DO NOT TAKE AGAIN UNTIL 08/09/15    . capecitabine (XELODA) 500 MG tablet Take 5 tablets (2,500 mg total) by mouth 2 (two) times daily after a meal. 140 tablet 2  . carvedilol (COREG) 12.5 MG tablet Take 1 tablet (12.5 mg total) by mouth 2 (two) times daily with a meal. 180 tablet 1  . gabapentin (NEURONTIN) 300 MG capsule Take 1 capsule (300 mg total) by mouth at bedtime. 90 capsule 1  . Insulin Glargine (BASAGLAR KWIKPEN) 100 UNIT/ML SOPN Inject 10u into the skin once a day, titrate up as advised by physician (Patient taking differently: 14 Units. Inject 10u into the skin once a day,  titrate up as advised by physician) 15 mL 3  . Insulin Pen Needle (PEN NEEDLES) 32G X 4  MM MISC 1 Units by Does not apply route daily. 90 each 4  . lidocaine-prilocaine (EMLA) cream Apply 1 application topically as needed. 30 g 2  . lisinopril-hydrochlorothiazide (PRINZIDE,ZESTORETIC) 20-25 MG tablet TAKE 1 TABLET BY MOUTH DAILY 90 tablet 0  . omeprazole (PRILOSEC) 40 MG capsule TAKE 1 CAPSULE(40 MG) BY MOUTH DAILY 90 capsule 0  . ondansetron (ZOFRAN) 8 MG tablet Take 1 tablet (8 mg total) by mouth every 8 (eight) hours as needed for nausea or vomiting. 30 tablet 0  . prochlorperazine (COMPAZINE) 10 MG tablet Take 1 tablet (10 mg total) by mouth every 6 (six) hours as needed for nausea or vomiting. 30 tablet 2  . traZODone (DESYREL) 50 MG tablet Take 1/2 tab po qam and 1.5 tabs po qhs 180 tablet 1   No current facility-administered medications for this visit.     REVIEW OF SYSTEMS:   Constitutional: Denies fevers, chills or abnormal night sweats (+) fatigue Eyes: Denies blurriness of vision, double vision or watery eyes Ears, nose, mouth, throat, and face: Denies mucositis or sore throat Respiratory: Denies cough, dyspnea or wheezes. Cardiovascular: Denies palpitation, chest discomfort or lower extremity swelling Gastrointestinal:  Denies heartburn. (+) Constipation. (+) Nausea Skin: Denies abnormal skin rashes Lymphatics: Denies new lymphadenopathy or easy bruising Neurological:Denies numbness, or new weaknesses Behavioral/Psych: Mood is stable, no new changes  All other systems were reviewed with the patient and are negative.  PHYSICAL EXAMINATION: ECOG PERFORMANCE STATUS: 1  Vitals:   10/02/16 1233  BP: 129/71  Pulse: 91  Resp: 18  Temp: 98.7 F (37.1 C)   There were no vitals filed for this visit. GENERAL:alert,  Morbidly obese. SKIN: skin color, texture, turgor are normal, no rashes or significant lesions EYES: normal, conjunctiva are pink and non-injected, sclera  clear OROPHARYNX:no exudate, no erythema and lips, buccal mucosa, and tongue normal  NECK: supple, thyroid normal size, non-tender, without nodularity LYMPH:  no palpable lymphadenopathy in the cervical, axillary or inguinal LUNGS: clear to auscultation and percussion with normal breathing effort HEART: regular rate & rhythm and no murmurs and no lower extremity edema ABDOMEN: abdomen soft,  Surgical incision and laparoscopic scars are well-healed. non-tender and normal bowel sounds  Musculoskeletal: no cyanosis of digits and no clubbing  PSYCH: alert & oriented x 3 with fluent speech NEURO: no focal motor/sensory deficits  LABORATORY DATA:  I have reviewed the data as listed CBC Latest Ref Rng & Units 10/02/2016 09/03/2016 08/12/2016  WBC 4.0 - 10.3 10e3/uL 10.1 9.0 7.6  Hemoglobin 13.0 - 17.1 g/dL 13.5 13.5 -  Hematocrit 38.4 - 49.9 % 39.1 39.5 45.2  Platelets 140 - 400 10e3/uL 304 175 230    CMP Latest Ref Rng & Units 10/02/2016 09/03/2016 08/12/2016  Glucose 70 - 140 mg/dl 202(H) 272(H) 275(H)  BUN 7.0 - 26.0 mg/dL 19.0 14.3 19  Creatinine 0.7 - 1.3 mg/dL 1.1 1.1 0.98  Sodium 136 - 145 mEq/L 135(L) 135(L) 135  Potassium 3.5 - 5.1 mEq/L 4.4 4.1 4.3  Chloride 96 - 106 mmol/L - - 95(L)  CO2 22 - 29 mEq/L _0 Calcium 8.4 - 10.4 mg/dL 10.0 9.5 9.2  Total Protein 6.4 - 8.3 g/dL 8.3 7.6 7.9  Total Bilirubin 0.20 - 1.20 mg/dL 1.12 1.14 1.0  Alkaline Phos 40 - 150 U/L 83 73 73  AST 5 - 34 U/L 24 19 52(H)  ALT 0 - 55 U/L 15 20 47(H)   CEA:  07/25/2015: 0.7 11/30/2015: 4.5 05/01/2016: 57.89 (  64.7)  07/18/2016: 329.87 (310.0) 09/03/2016: 589 10/02/2016: 676.76  PATHOLOGY REPORT: US Biopsy 06/24/2016 Diagnosis Liver, needle/core biopsy, right lobe - METASTATIC ADENOCARCINOMA, CONSISTENT WITH COLORECTAL PRIMARY. - SEE COMMENT. Microscopic Comment Dr. Burr Medico was paged on 06/25/16. Per request, a block will be sent for Foundation 1 testing and the results reported separately. (JBK:gt,  06/25/16)   Diagnosis 10/18/2015 1. Colon, segmental resection for tumor, rectosigmoid INFILTRATIVE MODERATELY DIFFERENTIATED ADENOCARCINOMA OF THE COLON (3.8 CM) THE TUMOR INVADES MUSCULARIS PROPRIA MARGINS OF RESECTION ARE NEGATIVE FOR TUMOR METASTATIC COLONIC ADENOCARCINOMA IN ONE OF TWENTY-NINE LYMPH NODES (1/29) 2. Colon, resection margin (donut), final distal margin BENIGN COLONIC TISSUE, NEGATIVE FOR CARCINOMA Microscopic Comment 1. COLON AND RECTUM (INCLUDING TRANS-ANAL RESECTION): Specimen: Rectosigmoid colon Procedure: Segmental resection Tumor site: Rectal colon Specimen integrity: Intact Macroscopic intactness of mesorectum: Not applicable: NA Complete: NA Near complete: x Incomplete: NA Cannot be determined (specify): NA Macroscopic tumor perforation: Muscularis propria Invasive tumor: Maximum size: 3.8 cm Histologic type(s): Adenocarcinoma Histologic grade and differentiation: G2 G1: well differentiated/low grade G2: moderately differentiated/low grade G3: poorly differentiated/high grade G4: undifferentiated/high grade Type of polyp in which invasive carcinoma arose: Tubular adenoma Microscopic extension of invasive tumor:Muscularis propria Lymph-Vascular invasion: Negative Peri-neural invasion: Negative Tumor deposit(s) (discontinuous extramural extension): Negative Resection margins: Proximal margin: Negative Distal margin: Negative Circumferential (radial) (posterior ascending, posterior descending; lateral and posterior mid-rectum; and entire lower 1/3 rectum):Negative Mesenteric margin (sigmoid and transverse): Negative Distance closest margin (if all above margins negative): 4.8 cm Trans-anal resection margins only: Deep margin: NA Mucosal Margin: NA Distance closest mucosal margin (if negative): NA Treatment effect (neo-adjuvant therapy): NA Additional polyp(s): Hyperplastic poly Non-neoplastic findings: Unremarkable Lymph nodes: number examined  29; number positive: 1 Pathologic Staging: T2, N1, M0   Mismatch Repair (MMR) Protein Immunohistochemistry (IHC) IHC Expression Result: MLH1: Preserved nuclear expression (greater 50% tumor expression) MSH2: Preserved nuclear expression (greater 50% tumor expression) MSH6: Preserved nuclear expression (greater 50% tumor expression) PMS2: Preserved nuclear expression (greater 50% tumor expression) * Internal control demonstrates intact nuclear expression Interpretation: NORMAL     Diagnosis 07/25/2015 1. Colon, polyp(s), descending - TUBULAR ADENOMA. NO HIGH GRADE DYSPLASIA OR MALIGNANCY IDENTIFIED. 2. Colon, biopsy, distal sigmoid mass - INVASIVE COLORECTAL ADENOCARCINOMA. 3. Colon, polyp(s), small recto sigmoid - HYPERPLASTIC POLYP. NO ADENOMATOUS CHANGE OR MALIGNANCY. 4. Rectum, polyp(s) - TUBULOVILLOUS ADENOMA. NO HIGH GRADE DYSPLASIA OR MALIGNANCY IDENTIFIED.   RADIOGRAPHIC STUDIES: I have personally reviewed the radiological images as listed and agreed with the findings in the report.  Abdominal MRI w wo contrast 06/05/2016 IMPRESSION: 1. 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 2. No additional metastatic disease in the upper abdomen. 3. Cholelithiasis.  CT chest, abdomen and pelvis w contrast 05/01/2016 IMPRESSION: 1. Stable chest CT.  No evidence of thoracic metastatic disease. 2. Progressive heterogeneity of the liver could be due to heterogeneous steatosis. However, there are new more focal low-density lesions which are worrisome for possible metastatic disease given the patient's history. Further evaluation with hepatic MRI recommended. 3. Resolving postsurgical retroperitoneal fluid collections status post partial colon resection and anastomosis.  COLONOSCOPY 07/25/2015 ENDOSCOPIC IMPRESSION: 1) 1/4 circumference firm ulcerated mass in distal sigmmoid - approx 22 cm from verge. Looks like cancer. Biopsied. 2) 8 mm descending polyp  and 5 mm rectosigmoid polyp removed cold snare, completely recovered and sent to path. 3) 18-20 mm pedunculated proximal rectal polyp removed hot snare and completely recovered for pathology. 4) Otherwise normal colonoscopy RECOMMENDATIONS: 1. Hold Aspirin and all other NSAIDS for  2 weeks. 2. Will call pathology results and plans though anticipate surgical referral after pathology in. CEA will be drawn at hospital today. He has had recent CT's of chest, and abd/pelvis - will see if he needs new ones - he may.  EGD 07/25/2015 ENDOSCOPIC IMPRESSION: Normal appearing esophagus and GE junction, the stomach was well visualized and normal in appearance, normal appearing duodenum  ASSESSMENT & PLAN:  46 y.o.  Caucasian male, with past medical history of heavy alcohol drinking, hypertension, diabetes, coronary artery disease, presented with abdominal pain.  1.  Cancer of left colon, Distal sigmoid, invasive adenocarcinoma, G2, pT2N1aM0, stage IIIA, MSI-stable, liver metastasis in 04/2016 -I initially reviewed his CT scan findings, and the surgical pathology findings include details with patient. -he has locally advanced sigmoid colon cancer, with one out of 29 lymph nodes positive. -He has completed 3 months of adjuvant chemotherapy CAPOX, tolerated well. -He unfortunately developed metastatic disease right after he completes adjuvant chemotherapy. MRI confirmed at least a 6 metastatic lesions in the liver, largest 4.2 cm. Giving the multiple liver metastasis, this is likely incurable metastatic cancer. -He underwent ultrasound-guided liver mass biopsy to confirm metastasis, which revealed metastatic adenocarcinoma consistent with a colorectal primary.  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 He is not a candidate for immunotherapy, and would not benefit from EGFR inhibitor. -He was on first-line chemotherapy CAPOX and avastin, unfortunately he developed severe infusion reaction to  oxaliplatin on 08/08/16, and oxliplatin has been held since then.  -His CEA has been trending up significantly, suspicious for disease progression. -I recomendchanging his chemotherapy to FOLFIRI and Avastin starting on 10/02/16. -Lab reviewed, we'll proceed with 1st cycle FOLFIRI and Avastin today. -And reviewed the potential side effects form chemotherapy and management strategy, especially diarrhea, nausea and neutropenia fever. -Restaging CT scan was ordered last time but did not get scheduled. We will obtain a CT scan next week.   2. Genetics -per NCCN guideline, due to his young age and family history of pancreatic cancer, he is qualified for genetic testing to ruled out inheritable genetic syndromes. -His genetic testing was negative. APC VUS called [id number] found on the colorectal cancer panel.  The Colorectal Cancer Panel offered by [facility] includes sequencing and/or duplication/deletion testing of the following 19 genes: APC, ATM, AXIN2, BMPR1A, CDH1, CHEK2, EPCAM, MLH1, MSH2, MSH6, MUTYH, PMS2, POLD1, POLE, PTEN, SCG5/GREM1, SMAD4, STK11, and TP53.  3. HTN, DM, CAD, morbid obesity -he will continue follow-up with his primary care physician -We will monitor his blood pressure, glucose closely during the chemotherapy, and may need to adjust his the medication if needed. -I strongly encouraged him to see Dr. Brigitte Pulse to adjust his diabetic medication due to his uncontrolled hyperglycemia and hypertension --his BG again is high, 272 today, he will refill his glipizide, and monitor his BG at home. -We previously discussed his premedication dexamethasone will increase his blood sugar, diabetic diet discussed again.  4. History of alcohol abuse -He has stopped alcohol completely since he started chemotherapy.   5. 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  -Near resolved now  6. Coping and social issues  -The patient is having a hard time with his diagnosis.  -I  previously offered social work to help whenever he is in need of it. He declined at that time.  7. Hyperglycemia  -His blood glucose is high TODAY, 202. -He checks at home. In the mornings it is high. Better in the night. -Previously encouraged him to eat  healthy. He has stopped eating fast food and ice cream. -He began insulin per Dr. Brigitte Pulse. I encouraged the patient to follow up with Dr. Brigitte Pulse if the patient's blood sugar is uncontrolled.  8. Goal of care discussion  -We have previously discussed the incurable nature of his cancer, and the overall poor prognosis, especially if he does not have good response to chemotherapy or progress on chemo -The patient understands the goal of care is palliative. -I recommend DNR/DNI, he will think about it    Plan -Lab reviewed; The patient will receive infusion FOLFIRI and Avastin today.. -The patient will follow up in 2 weeks with CT scan beforehand.  All questions were answered. The patient knows to call the clinic with any problems, questions or concerns.  I spent 20 minutes counseling the patient face to face. The total time spent in the appointment was 25 minutes and more than 50% was on counseling.   Chad Merle, MD 10/02/2016  This document serves as a record of services personally performed by Chad Merle, MD. It was created on her behalf by Darcus Austin, a trained medical scribe. The creation of this record is based on the scribe's personal observations and the provider's statements to them. This document has been checked and approved by the attending provider.

## 2016-10-02 ENCOUNTER — Ambulatory Visit (HOSPITAL_BASED_OUTPATIENT_CLINIC_OR_DEPARTMENT_OTHER): Payer: BLUE CROSS/BLUE SHIELD

## 2016-10-02 ENCOUNTER — Other Ambulatory Visit: Payer: Self-pay | Admitting: Emergency Medicine

## 2016-10-02 ENCOUNTER — Ambulatory Visit: Payer: BLUE CROSS/BLUE SHIELD

## 2016-10-02 ENCOUNTER — Ambulatory Visit (HOSPITAL_BASED_OUTPATIENT_CLINIC_OR_DEPARTMENT_OTHER): Payer: BLUE CROSS/BLUE SHIELD | Admitting: Hematology

## 2016-10-02 ENCOUNTER — Other Ambulatory Visit (HOSPITAL_BASED_OUTPATIENT_CLINIC_OR_DEPARTMENT_OTHER): Payer: BLUE CROSS/BLUE SHIELD

## 2016-10-02 VITALS — BP 123/66 | HR 88 | Temp 98.2°F | Resp 18

## 2016-10-02 VITALS — BP 129/71 | HR 91 | Temp 98.7°F | Resp 18

## 2016-10-02 DIAGNOSIS — C186 Malignant neoplasm of descending colon: Secondary | ICD-10-CM | POA: Diagnosis not present

## 2016-10-02 DIAGNOSIS — C787 Secondary malignant neoplasm of liver and intrahepatic bile duct: Secondary | ICD-10-CM

## 2016-10-02 DIAGNOSIS — Z79899 Other long term (current) drug therapy: Secondary | ICD-10-CM | POA: Diagnosis not present

## 2016-10-02 DIAGNOSIS — E1165 Type 2 diabetes mellitus with hyperglycemia: Secondary | ICD-10-CM

## 2016-10-02 DIAGNOSIS — I10 Essential (primary) hypertension: Secondary | ICD-10-CM

## 2016-10-02 DIAGNOSIS — Z5112 Encounter for antineoplastic immunotherapy: Secondary | ICD-10-CM

## 2016-10-02 DIAGNOSIS — K760 Fatty (change of) liver, not elsewhere classified: Secondary | ICD-10-CM

## 2016-10-02 DIAGNOSIS — Z5111 Encounter for antineoplastic chemotherapy: Secondary | ICD-10-CM

## 2016-10-02 DIAGNOSIS — C189 Malignant neoplasm of colon, unspecified: Secondary | ICD-10-CM

## 2016-10-02 DIAGNOSIS — K8020 Calculus of gallbladder without cholecystitis without obstruction: Secondary | ICD-10-CM

## 2016-10-02 DIAGNOSIS — C19 Malignant neoplasm of rectosigmoid junction: Secondary | ICD-10-CM

## 2016-10-02 LAB — WHOLE BLOOD GLUCOSE
Glucose: 231 mg/dL — ABNORMAL HIGH (ref 70–100)
HRS PC: 2.5 Hours

## 2016-10-02 LAB — CBC WITH DIFFERENTIAL/PLATELET
BASO%: 1.1 % (ref 0.0–2.0)
BASOS ABS: 0.1 10*3/uL (ref 0.0–0.1)
EOS%: 3.6 % (ref 0.0–7.0)
Eosinophils Absolute: 0.4 10*3/uL (ref 0.0–0.5)
HEMATOCRIT: 39.1 % (ref 38.4–49.9)
HEMOGLOBIN: 13.5 g/dL (ref 13.0–17.1)
LYMPH#: 2.4 10*3/uL (ref 0.9–3.3)
LYMPH%: 24 % (ref 14.0–49.0)
MCH: 34.1 pg — AB (ref 27.2–33.4)
MCHC: 34.5 g/dL (ref 32.0–36.0)
MCV: 98.9 fL — ABNORMAL HIGH (ref 79.3–98.0)
MONO#: 1 10*3/uL — AB (ref 0.1–0.9)
MONO%: 9.7 % (ref 0.0–14.0)
NEUT#: 6.2 10*3/uL (ref 1.5–6.5)
NEUT%: 61.6 % (ref 39.0–75.0)
Platelets: 304 10*3/uL (ref 140–400)
RBC: 3.95 10*6/uL — ABNORMAL LOW (ref 4.20–5.82)
RDW: 16.6 % — AB (ref 11.0–14.6)
WBC: 10.1 10*3/uL (ref 4.0–10.3)

## 2016-10-02 LAB — COMPREHENSIVE METABOLIC PANEL
ALBUMIN: 3.5 g/dL (ref 3.5–5.0)
ALT: 15 U/L (ref 0–55)
AST: 24 U/L (ref 5–34)
Alkaline Phosphatase: 83 U/L (ref 40–150)
Anion Gap: 11 mEq/L (ref 3–11)
BUN: 19 mg/dL (ref 7.0–26.0)
CHLORIDE: 99 meq/L (ref 98–109)
CO2: 25 mEq/L (ref 22–29)
CREATININE: 1.1 mg/dL (ref 0.7–1.3)
Calcium: 10 mg/dL (ref 8.4–10.4)
EGFR: 82 mL/min/{1.73_m2} — ABNORMAL LOW (ref >90)
GLUCOSE: 202 mg/dL — AB (ref 70–140)
POTASSIUM: 4.4 meq/L (ref 3.5–5.1)
SODIUM: 135 meq/L — AB (ref 136–145)
Total Bilirubin: 1.12 mg/dL (ref 0.20–1.20)
Total Protein: 8.3 g/dL (ref 6.4–8.3)

## 2016-10-02 LAB — UA PROTEIN, DIPSTICK - CHCC: PROTEIN: NEGATIVE mg/dL

## 2016-10-02 LAB — CEA (IN HOUSE-CHCC): CEA (CHCC-In House): 676.76 ng/mL — ABNORMAL HIGH (ref 0.00–5.00)

## 2016-10-02 MED ORDER — PALONOSETRON HCL INJECTION 0.25 MG/5ML
INTRAVENOUS | Status: AC
  Filled 2016-10-02: qty 5

## 2016-10-02 MED ORDER — SODIUM CHLORIDE 0.9 % IV SOLN
400.0000 mg/m2 | Freq: Once | INTRAVENOUS | Status: AC
  Administered 2016-10-02: 1224 mg via INTRAVENOUS
  Filled 2016-10-02: qty 61.2

## 2016-10-02 MED ORDER — LEUCOVORIN CALCIUM INJECTION 350 MG: 400.0000 mg/m2 | Freq: Once | INTRAVENOUS | Status: DC

## 2016-10-02 MED ORDER — SODIUM CHLORIDE 0.9 % IV SOLN
2400.0000 mg/m2 | INTRAVENOUS | Status: DC
  Administered 2016-10-02: 7350 mg via INTRAVENOUS
  Filled 2016-10-02: qty 147

## 2016-10-02 MED ORDER — SODIUM CHLORIDE 0.9 % IV SOLN
5.1000 mg/kg | Freq: Once | INTRAVENOUS | Status: AC
  Administered 2016-10-02: 900 mg via INTRAVENOUS
  Filled 2016-10-02: qty 32

## 2016-10-02 MED ORDER — ATROPINE SULFATE 1 MG/ML IJ SOLN
0.5000 mg | Freq: Once | INTRAMUSCULAR | Status: AC | PRN
  Administered 2016-10-02: 0.5 mg via INTRAVENOUS

## 2016-10-02 MED ORDER — DEXAMETHASONE SODIUM PHOSPHATE 10 MG/ML IJ SOLN
10.0000 mg | Freq: Once | INTRAMUSCULAR | Status: AC
  Administered 2016-10-02: 10 mg via INTRAVENOUS

## 2016-10-02 MED ORDER — IRINOTECAN HCL CHEMO INJECTION 100 MG/5ML: 180.0000 mg/m2 | Freq: Once | INTRAVENOUS | Status: DC

## 2016-10-02 MED ORDER — PALONOSETRON HCL INJECTION 0.25 MG/5ML
0.2500 mg | Freq: Once | INTRAVENOUS | Status: AC
  Administered 2016-10-02: 0.25 mg via INTRAVENOUS

## 2016-10-02 MED ORDER — FLUOROURACIL CHEMO INJECTION 2.5 GM/50ML
400.0000 mg/m2 | Freq: Once | INTRAVENOUS | Status: AC
  Administered 2016-10-02: 1200 mg via INTRAVENOUS
  Filled 2016-10-02: qty 24

## 2016-10-02 MED ORDER — SODIUM CHLORIDE 0.9 % IV SOLN
180.0000 mg/m2 | Freq: Once | INTRAVENOUS | Status: AC
  Administered 2016-10-02: 560 mg via INTRAVENOUS
  Filled 2016-10-02: qty 15

## 2016-10-02 MED ORDER — ATROPINE SULFATE 1 MG/ML IJ SOLN
INTRAMUSCULAR | Status: AC
  Filled 2016-10-02: qty 1

## 2016-10-02 MED ORDER — SODIUM CHLORIDE 0.9 % IV SOLN
Freq: Once | INTRAVENOUS | Status: AC
  Administered 2016-10-02: 12:00:00 via INTRAVENOUS

## 2016-10-02 MED ORDER — DEXAMETHASONE SODIUM PHOSPHATE 10 MG/ML IJ SOLN
INTRAMUSCULAR | Status: AC
  Filled 2016-10-02: qty 1

## 2016-10-02 NOTE — Patient Instructions (Signed)
[facility] Discharge Instructions for Patients Receiving Chemotherapy  Today you received the following chemotherapy agents Camptosar, Leucovorin, 5FU and Avastin.  To help prevent nausea and vomiting after your treatment, we encourage you to take your nausea medication.  If you develop nausea and vomiting that is not controlled by your nausea medication, call the clinic.   BELOW ARE SYMPTOMS THAT SHOULD BE REPORTED IMMEDIATELY:  *FEVER GREATER THAN 100.5 F  *CHILLS WITH OR WITHOUT FEVER  NAUSEA AND VOMITING THAT IS NOT CONTROLLED WITH YOUR NAUSEA MEDICATION  *UNUSUAL SHORTNESS OF BREATH  *UNUSUAL BRUISING OR BLEEDING  TENDERNESS IN MOUTH AND THROAT WITH OR WITHOUT PRESENCE OF ULCERS  *URINARY PROBLEMS  *BOWEL PROBLEMS  UNUSUAL RASH Items with * indicate a potential emergency and should be followed up as soon as possible.  Feel free to call the clinic you have any questions or concerns. The clinic phone number is [phone].  Please show the [facility] at check-in to the Emergency Department and triage nurse.

## 2016-10-03 ENCOUNTER — Telehealth: Payer: Self-pay | Admitting: *Deleted

## 2016-10-03 ENCOUNTER — Encounter: Payer: Self-pay | Admitting: Hematology

## 2016-10-03 NOTE — Telephone Encounter (Signed)
-----   Message from Graylon Gunning, RN sent at 10/02/2016  4:55 PM EDT ----- Regarding: feng 1st chemo 1st Camptosar, Leucouvorin and 5FU---Feng

## 2016-10-03 NOTE — Telephone Encounter (Signed)
Chemotherapy f/u call.  Pt reports that he had some nausea & dizziness after chemo last night & this am & took nausea med & felt better.  He is at work & states that he feels OK right now.  He reports that he had some nose bleeds & forgot to tell Dr Burr Medico, about 3 x's at hs & said he bled all over his sheets.  None since.  Discussed using some neosporin nasally bid to see if this will help prevent this & to report any more. He knows to come tomorrow for pump D/C & to call if he has questions or concerns.

## 2016-10-04 ENCOUNTER — Ambulatory Visit (HOSPITAL_BASED_OUTPATIENT_CLINIC_OR_DEPARTMENT_OTHER): Payer: BLUE CROSS/BLUE SHIELD

## 2016-10-04 ENCOUNTER — Ambulatory Visit: Payer: BLUE CROSS/BLUE SHIELD | Admitting: Hematology

## 2016-10-04 VITALS — BP 109/58 | HR 87 | Temp 97.8°F | Resp 18

## 2016-10-04 DIAGNOSIS — Z452 Encounter for adjustment and management of vascular access device: Secondary | ICD-10-CM

## 2016-10-04 DIAGNOSIS — C186 Malignant neoplasm of descending colon: Secondary | ICD-10-CM

## 2016-10-04 MED ORDER — HEPARIN SOD (PORK) LOCK FLUSH 100 UNIT/ML IV SOLN
500.0000 [IU] | Freq: Once | INTRAVENOUS | Status: AC | PRN
  Administered 2016-10-04: 500 [IU]
  Filled 2016-10-04: qty 5

## 2016-10-04 MED ORDER — SODIUM CHLORIDE 0.9% FLUSH
10.0000 mL | INTRAVENOUS | Status: DC | PRN
  Administered 2016-10-04: 10 mL
  Filled 2016-10-04: qty 10

## 2016-10-09 NOTE — Progress Notes (Signed)
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   Staging form: Colon and Rectum, AJCC 7th Edition     Pathologic stage from 10/18/2015: Stage IIIA (T2, N1a, cM0) - Signed by Truitt Merle, MD on 11/15/2015       Cancer of left colon ([place])   05/11/2015 Imaging    CT ABD/PELVIS: Gallstones w/sludge;liver prominent without focal liver lesion, mild thickening of bladder wall; negative for cancer      05/18/2015 Imaging    [place] ABD: Negative      07/25/2015 Initial Diagnosis    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 8 mm descending polyp and 5 mm rectosigmoid polyp;18-29 mm proximal rectal polyp      07/25/2015 Tumor Marker    CEA=0.7      07/26/2015 Pathology Results    Sigmoid mass: invasive colorectal adenocarcinoma      10/22/2015 Pathologic Stage    T2, N1, MO    #29 nodes examined w/ 1 postive node; moderately differentiated; Negative for Lymph-vascular and Peri-neural invasion; Microscopic extension into muscularis propria      10/26/2015 Pathology Results    MSI Stable      11/30/2015 - 02/08/2016 Chemotherapy    Adjuvant chemotherapy with Capecitabine 2500 mg twice daily, on day  1-14, oxaliplatin 130 mg/m, every 21 days      01/19/2016 Genetic Testing    Patient has genetic testing done for young colon cancer. APC [id number] VUS identified on the Colorectal cancer panel.  The Colorectal Cancer Panel offered by [facility] includes sequencing and/or duplication/deletion testing of the following 19 genes: APC, ATM, AXIN2, BMPR1A, CDH1, CHEK2, EPCAM, MLH1, MSH2, MSH6, MUTYH, PMS2, POLD1, POLE, PTEN, SCG5/GREM1, SMAD4, STK11, and TP53.       05/01/2016 Imaging    Surveillance CT CAP scan showed new more focal low-density lesions in the liver, possible metastasis. No other new lesions.      05/01/2016 Progression    Surveillance CT scan showed probable liver metastasis, confirmed by liver mass biopsy       06/05/2016 Imaging    MR ABDOMEN W WO CONTRAST IMPRESSION: 1. 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 2. No additional metastatic disease in the upper abdomen. 3. Cholelithiasis.      06/24/2016 Pathology Results    US Biopsy Diagnosis Liver, needle/core biopsy, right lobe - METASTATIC ADENOCARCINOMA, CONSISTENT WITH COLORECTAL PRIMARY.      07/16/2016 Miscellaneous    Foundation One results received      07/18/2016 - 09/03/2016 Chemotherapy    Chemo CAPOX, every 3 weeks, started on 07/18/2016 and added Avastin, every 3 weeks, on 08/08/16. Oxaplatin held since 09/03/16 due to severe infusion reaction. Patient was to hold Xeloda from 08/09/16 and restart on 09/04/16.  10/02/2016 -  Chemotherapy    Chemotherapy FOLFIRI and Avastin starting 10/02/16, every 2 weeks      10/15/2016 Imaging    CT CAP w/ contrast 10/15/16 IMPRESSION: 1. Unfortunately there is worsening of the hepatic metastatic burden with generally enlarging and scattered new hepatic metastatic lesions. 2. New right hydronephrosis, hydroureter, and delayed excretion on the right indicating high-grade obstruction of the ureter. This obstruction appears to be near the iliac  vessel cross over where there is abnormal mesenteric stranding and nodularity likely reflecting some residual tumor causing traction on or possibly even invading the right ureter. This may warrant ureteral stenting or nephrostomy. 3. Other imaging findings of potential clinical significance: Stable calcified pleural nodularity on the right, no change from 2014. Coronary atherosclerosis. Cholelithiasis. Stable mild extrahepatic biliary dilatation. Extensive bridging spurring in the thoracolumbar spine and sacroiliac joints.      10/15/2016 Progression    Restaging CT scan showed worsening liver metastasis, high-grade right ureter obstruction and hydronephrosis, concerning for peritoneal metastasis.       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 male is here because of recently diagnosed colon cancer.  He is accompanied by his friend to the clinic today.  He presented with abdominal pain since Oct 2016,  Was seen in the emergency room on 05/11/2015,  CT abdomen and pelvis showed  Mild thickening of the urinary bladder wall, gallstones, otherwise negative. He was seen by her primary care physician Dr. Brigitte Pulse afterwards, who referred him to GI Dr. Carlean Purl.  His father passed away from [place] cancer around that time,  So his appointment was postponed, and he finally had colonoscopy in early January 2017, which showed multiple polyps, and  A ulcerated mass in distal sigmoid colon.  He was subsequently referred to colorectal surgeon Dr. Johney Maine, and  Eventually had left colectomy  On 11/01/2015.  He tolerated surgery well, and recovered well. He has good appetite good, BM is normal most of time, occasional diarrhea, likely second to food, mild low abdomen pain, which has improves, he lost about 30 lbs before surgery and 10lbs after surgery.    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  He is divorced, he has a 28 year old daughter who lives with her mother in [place]. He has sisters and brothers, and her mother who live close by.   CURRENT THERAPY:  Chemotherapy FOLFIRI and Avastin starting 10/02/16, every 2 weeks  INTERIM HISTORY: Al returns for follow-up and his second cycle of FOLFIRI and Avastin chemotherapy. He was seen in the infusion room with his mother.   He reports having a dry mouth and blood nose discharge when he blows his nose. He would have to pinch his nose for it to stop. Occasional sharp upper abdominal pain after he eats that lasts for about 30 seconds. Nausea for a few days after chemo that improves. Reports itchy rashes on his forearms, flanks, chest, and neck. Constipated for a few days. Took miraxlax and eats bananas and they help.  MEDICAL HISTORY:  Past Medical History:  Diagnosis Date  . Cancer [facility])    colon cancer  . Coronary artery disease   . Diabetes mellitus without complication ([place]) 08/9560   oral agents only (07/2015)  . ETOH abuse 2014  .  Fatty liver 12/2014   on ultrasound. pt obese and abuses ETOH  . Gallstone 12/2014   GB stones and sludge on ultrasound and CT,  . GERD (gastroesophageal reflux disease)   . Hypertension   . Morbid obesity with BMI of 50.0-59.9, adult ([place]) 03/2013  . Rectal bleeding 05/2015   07/2015 colonoscopy with malignant appearing sigmoid mass. Additional rectal, descending, polyps  . Shoulder pain, bilateral    due to previous injury    SURGICAL HISTORY: Past Surgical History:  Procedure Laterality Date  . CARDIAC CATHETERIZATION N/A 04/06/2015   Procedure: Left Heart Cath and Coronary Angiography;  Surgeon: Belva Crome, MD;  Location: [facility];  Service: Cardiovascular;  Laterality: N/A;  . COLONOSCOPY N/A 07/25/2015   Procedure: COLONOSCOPY;  Surgeon: Gatha Mayer, MD;  Location: [facility];   Service: Gastroenterology;  Laterality: N/A;  . CYSTOSCOPY  10/18/2015   Procedure: CYSTOSCOPY FLEXIBLE URETHRAL DIALATION AND FOLEY PLACEMENT;  Surgeon: Alexis Frock, MD;  Location: [facility];  Service: Urology;;  . ESOPHAGOGASTRODUODENOSCOPY N/A 07/25/2015   Procedure: ESOPHAGOGASTRODUODENOSCOPY (EGD);  Surgeon: Gatha Mayer, MD;  Location: [facility];  Service: Gastroenterology;  Laterality: N/A;  . PORTACATH PLACEMENT Right 08/01/2016   Procedure: INSERTION PORT-A-CATH WITH ULTRASOUND AND FLUOROSOPIC GUIDANCE - RIGHT INTERNAL JUGULAR;  Surgeon: Michael Boston, MD;  Location: [facility];  Service: General;  Laterality: Right;    SOCIAL HISTORY: Social History   Social History  . Marital status: Legally Separated    Spouse name: N/A  . Number of children: 1  . Years of education: N/A   Occupational History  . Manager    Social History Main Topics  . Smoking status: Never Smoker  . Smokeless tobacco: Current User    Types: Chew     Comment: occasionally dips tobacco - has used for approx 20 yrs (12/21/15)  . Alcohol use Yes     Comment: heavy drinking for 10 years, 2-3  drinks of liqour a night, has been cutting back.    . Drug use: No  . Sexual activity: Not on file   Other Topics Concern  . Not on file   Social History Narrative   Divorced, has 87 yo daughter lives in [place]   Lives alone   Manages chain of car wash dealers       FAMILY HISTORY: Family History  Problem Relation Age of Onset  . Diabetes Father   . Pancreatic cancer Father 74    former smoker, but had not smoked in 43 years  . Cancer Maternal Uncle     d. 54s; unknown type of cancer -metastatic throughout abdomen   . Other Brother     oldest brother has had a normal colonoscopy  . Heart attack Maternal Grandfather     d. 57-58; heavy smoker and drinker  . Heart attack Paternal Grandmother     d. early 17s  . Heart attack Paternal Grandfather 86  . Heart failure Maternal Uncle   . Skin cancer Maternal Uncle  42    NOS type; was out in the sun a lot  . Brain cancer Maternal Uncle     NOS type; mother's maternal half-brother dx. 81-68  . Cirrhosis Paternal Uncle   . Cancer Paternal Uncle     (x2-3) uncles with NOS cancers; d. 52s    ALLERGIES:  has No Known Allergies.  MEDICATIONS:  Current Outpatient Prescriptions  Medication Sig Dispense Refill  . acetaminophen (TYLENOL) 500 MG tablet Take 1,000  mg by mouth every 6 (six) hours as needed for moderate pain or headache.    . Ascorbic Acid (VITAMIN C) 1000 MG tablet Take 1,000 mg by mouth daily.    Marland Kitchen aspirin EC 81 MG tablet Take 1 tablet (81 mg total) by mouth daily. DO NOT TAKE AGAIN UNTIL 08/09/15    . capecitabine (XELODA) 500 MG tablet Take 5 tablets (2,500 mg total) by mouth 2 (two) times daily after a meal. 140 tablet 2  . carvedilol (COREG) 12.5 MG tablet Take 1 tablet (12.5 mg total) by mouth 2 (two) times daily with a meal. 180 tablet 1  . gabapentin (NEURONTIN) 300 MG capsule Take 1 capsule (300 mg total) by mouth at bedtime. 90 capsule 1  . Insulin Glargine (BASAGLAR KWIKPEN) 100 UNIT/ML SOPN Inject 10u into the skin once a day, titrate up as advised by physician (Patient taking differently: 14 Units. Inject 10u into the skin once a day, titrate up as advised by physician) 15 mL 3  . Insulin Pen Needle (PEN NEEDLES) 32G X 4 MM MISC 1 Units by Does not apply route daily. 90 each 4  . lidocaine-prilocaine (EMLA) cream Apply 1 application topically as needed. 30 g 2  . lisinopril-hydrochlorothiazide (PRINZIDE,ZESTORETIC) 20-25 MG tablet TAKE 1 TABLET BY MOUTH DAILY 90 tablet 0  . omeprazole (PRILOSEC) 40 MG capsule TAKE 1 CAPSULE(40 MG) BY MOUTH DAILY 90 capsule 0  . ondansetron (ZOFRAN) 8 MG tablet Take 1 tablet (8 mg total) by mouth every 8 (eight) hours as needed for nausea or vomiting. 30 tablet 0  . prochlorperazine (COMPAZINE) 10 MG tablet Take 1 tablet (10 mg total) by mouth every 6 (six) hours as needed for nausea or vomiting. 30  tablet 2  . traZODone (DESYREL) 50 MG tablet Take 1/2 tab po qam and 1.5 tabs po qhs 180 tablet 1   No current facility-administered medications for this visit.     REVIEW OF SYSTEMS:   Constitutional: Denies fevers, chills or abnormal night sweats (+) fatigue Eyes: Denies blurriness of vision, double vision or watery eyes Ears, nose, mouth, throat, and face: Denies mucositis or sore throat Respiratory: Denies cough, dyspnea or wheezes. Cardiovascular: Denies palpitation, chest discomfort or lower extremity swelling Gastrointestinal:  Denies heartburn. (+) Constipation. (+) Nausea Skin: Denies abnormal skin rashes Lymphatics: Denies new lymphadenopathy or easy bruising Neurological:Denies numbness, or new weaknesses Behavioral/Psych: Mood is stable, no new changes  All other systems were reviewed with the patient and are negative.  PHYSICAL EXAMINATION: ECOG PERFORMANCE STATUS: 1 The blood pressure 151/90, heart rate 84, respiratory rate 18, temperature 37.2, pulse ox 99% on room air GENERAL:alert,  Morbidly obese. SKIN: skin color, texture, turgor are normal, no rashes or significant lesions EYES: normal, conjunctiva are pink and non-injected, sclera clear OROPHARYNX:no exudate, no erythema and lips, buccal mucosa, and tongue normal  NECK: supple, thyroid normal size, non-tender, without nodularity LYMPH:  no palpable lymphadenopathy in the cervical, axillary or inguinal LUNGS: clear to auscultation and percussion with normal breathing effort HEART: regular rate & rhythm and no murmurs and no lower extremity edema ABDOMEN: abdomen soft,  Surgical incision and laparoscopic scars are well-healed. non-tender and normal bowel sounds  Musculoskeletal: no cyanosis of digits and no clubbing  PSYCH: alert & oriented x 3 with fluent speech NEURO: no focal motor/sensory deficits  LABORATORY DATA:  I have reviewed the data as listed CBC Latest Ref Rng & Units 10/02/2016 09/03/2016 08/12/2016    WBC 4.0 - 10.3 10e3/uL 10.1  9.0 7.6  Hemoglobin 13.0 - 17.1 g/dL 13.5 13.5 -  Hematocrit 38.4 - 49.9 % 39.1 39.5 45.2  Platelets 140 - 400 10e3/uL 304 175 230    CMP Latest Ref Rng & Units 10/02/2016 09/03/2016 08/12/2016  Glucose 70 - 140 mg/dl 202(H) 272(H) 275(H)  BUN 7.0 - 26.0 mg/dL 19.0 14.3 19  Creatinine 0.7 - 1.3 mg/dL 1.1 1.1 0.98  Sodium 136 - 145 mEq/L 135(L) 135(L) 135  Potassium 3.5 - 5.1 mEq/L 4.4 4.1 4.3  Chloride 96 - 106 mmol/L - - 95(L)  CO2 22 - 29 mEq/L '25 24 20  '$ Calcium 8.4 - 10.4 mg/dL 10.0 9.5 9.2  Total Protein 6.4 - 8.3 g/dL 8.3 7.6 7.9  Total Bilirubin 0.20 - 1.20 mg/dL 1.12 1.14 1.0  Alkaline Phos 40 - 150 U/L 83 73 73  AST 5 - 34 U/L 24 19 52(H)  ALT 0 - 55 U/L 15 20 47(H)   CEA:  07/25/2015: 0.7 11/30/2015: 4.5 05/01/2016: 57.89 (64.7)  07/18/2016: 329.87 (310.0) 09/03/2016: 589 10/02/2016: 676.76  PATHOLOGY REPORT: US Biopsy 06/24/2016 Diagnosis Liver, needle/core biopsy, right lobe - METASTATIC ADENOCARCINOMA, CONSISTENT WITH COLORECTAL PRIMARY. - SEE COMMENT. Microscopic Comment Dr. Burr Medico was paged on 06/25/16. Per request, a block will be sent for Foundation 1 testing and the results reported separately. (JBK:gt, 06/25/16)   Diagnosis 10/18/2015 1. Colon, segmental resection for tumor, rectosigmoid INFILTRATIVE MODERATELY DIFFERENTIATED ADENOCARCINOMA OF THE COLON (3.8 CM) THE TUMOR INVADES MUSCULARIS PROPRIA MARGINS OF RESECTION ARE NEGATIVE FOR TUMOR METASTATIC COLONIC ADENOCARCINOMA IN ONE OF TWENTY-NINE LYMPH NODES (1/29) 2. Colon, resection margin (donut), final distal margin BENIGN COLONIC TISSUE, NEGATIVE FOR CARCINOMA Microscopic Comment 1. COLON AND RECTUM (INCLUDING TRANS-ANAL RESECTION): Specimen: Rectosigmoid colon Procedure: Segmental resection Tumor site: Rectal colon Specimen integrity: Intact Macroscopic intactness of mesorectum: Not applicable: NA Complete: NA Near complete: x Incomplete: NA Cannot be determined  (specify): NA Macroscopic tumor perforation: Muscularis propria Invasive tumor: Maximum size: 3.8 cm Histologic type(s): Adenocarcinoma Histologic grade and differentiation: G2 G1: well differentiated/low grade G2: moderately differentiated/low grade G3: poorly differentiated/high grade G4: undifferentiated/high grade Type of polyp in which invasive carcinoma arose: Tubular adenoma Microscopic extension of invasive tumor:Muscularis propria Lymph-Vascular invasion: Negative Peri-neural invasion: Negative Tumor deposit(s) (discontinuous extramural extension): Negative Resection margins: Proximal margin: Negative Distal margin: Negative Circumferential (radial) (posterior ascending, posterior descending; lateral and posterior mid-rectum; and entire lower 1/3 rectum):Negative Mesenteric margin (sigmoid and transverse): Negative Distance closest margin (if all above margins negative): 4.8 cm Trans-anal resection margins only: Deep margin: NA Mucosal Margin: NA Distance closest mucosal margin (if negative): NA Treatment effect (neo-adjuvant therapy): NA Additional polyp(s): Hyperplastic poly Non-neoplastic findings: Unremarkable Lymph nodes: number examined 29; number positive: 1 Pathologic Staging: T2, N1, M0   Mismatch Repair (MMR) Protein Immunohistochemistry (IHC) IHC Expression Result: MLH1: Preserved nuclear expression (greater 50% tumor expression) MSH2: Preserved nuclear expression (greater 50% tumor expression) MSH6: Preserved nuclear expression (greater 50% tumor expression) PMS2: Preserved nuclear expression (greater 50% tumor expression) * Internal control demonstrates intact nuclear expression Interpretation: NORMAL     Diagnosis 07/25/2015 1. Colon, polyp(s), descending - TUBULAR ADENOMA. NO HIGH GRADE DYSPLASIA OR MALIGNANCY IDENTIFIED. 2. Colon, biopsy, distal sigmoid mass - INVASIVE COLORECTAL ADENOCARCINOMA. 3. Colon, polyp(s), small recto sigmoid -  HYPERPLASTIC POLYP. NO ADENOMATOUS CHANGE OR MALIGNANCY. 4. Rectum, polyp(s) - TUBULOVILLOUS ADENOMA. NO HIGH GRADE DYSPLASIA OR MALIGNANCY IDENTIFIED.   RADIOGRAPHIC STUDIES: I have personally reviewed the radiological images as listed and agreed with the findings in the report.  CT CAP w/ contrast 10/15/16 IMPRESSION: 1. Unfortunately there is worsening of the hepatic metastatic burden with generally enlarging and scattered new hepatic metastatic lesions. 2. New right hydronephrosis, hydroureter, and delayed excretion on the right indicating high-grade obstruction of the ureter. This obstruction appears to be near the iliac vessel cross over where there is abnormal mesenteric stranding and nodularity likely reflecting some residual tumor causing traction on or possibly even invading the right ureter. This may warrant ureteral stenting or nephrostomy. 3. Other imaging findings of potential clinical significance: Stable calcified pleural nodularity on the right, no change from 2014. Coronary atherosclerosis. Cholelithiasis. Stable mild extrahepatic biliary dilatation. Extensive bridging spurring in the thoracolumbar spine and sacroiliac joints.  Abdominal MRI w wo contrast 06/05/2016 IMPRESSION: 1. 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 2. No additional metastatic disease in the upper abdomen. 3. Cholelithiasis.  CT chest, abdomen and pelvis w contrast 05/01/2016 IMPRESSION: 1. Stable chest CT.  No evidence of thoracic metastatic disease. 2. Progressive heterogeneity of the liver could be due to heterogeneous steatosis. However, there are new more focal low-density lesions which are worrisome for possible metastatic disease given the patient's history. Further evaluation with hepatic MRI recommended. 3. Resolving postsurgical retroperitoneal fluid collections status post partial colon resection and anastomosis.  COLONOSCOPY 07/25/2015 ENDOSCOPIC  IMPRESSION: 1) 1/4 circumference firm ulcerated mass in distal sigmmoid - approx 22 cm from verge. Looks like cancer. Biopsied. 2) 8 mm descending polyp and 5 mm rectosigmoid polyp removed cold snare, completely recovered and sent to path. 3) 18-20 mm pedunculated proximal rectal polyp removed hot snare and completely recovered for pathology. 4) Otherwise normal colonoscopy RECOMMENDATIONS: 1. Hold Aspirin and all other NSAIDS for 2 weeks. 2. Will call pathology results and plans though anticipate surgical referral after pathology in. CEA will be drawn at hospital today. He has had recent CT's of chest, and abd/pelvis - will see if he needs new ones - he may.  EGD 07/25/2015 ENDOSCOPIC IMPRESSION: Normal appearing esophagus and GE junction, the stomach was well visualized and normal in appearance, normal appearing duodenum  ASSESSMENT & PLAN:  46 y.o.  Caucasian male, with past medical history of heavy alcohol drinking, hypertension, diabetes, coronary artery disease, presented with abdominal pain.  1.  Cancer of left colon, Distal sigmoid, invasive adenocarcinoma, G2, pT2N1aM0, stage IIIA, MSI-stable, liver metastasis in 04/2016, possible peritoneal mets in 10/2016 -I initially reviewed his CT scan findings, and the surgical pathology findings include details with patient. -he has locally advanced sigmoid colon cancer, with one out of 29 lymph nodes positive. -He has completed 3 months of adjuvant chemotherapy CAPOX, tolerated well. -He unfortunately developed metastatic disease right after he completes adjuvant chemotherapy. MRI confirmed at least a 6 metastatic lesions in the liver, largest 4.2 cm. Giving the multiple liver metastasis, this is likely incurable metastatic cancer. -He underwent ultrasound-guided liver mass biopsy to confirm metastasis, which revealed metastatic adenocarcinoma consistent with a colorectal primary.  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 He  is not a candidate for immunotherapy, and would not benefit from EGFR inhibitor. -He was on first-line chemotherapy CAPOX and avastin, unfortunately he developed severe infusion reaction to oxaliplatin on 08/08/16, and oxliplatin has been held since then.  -His CEA has been trending up significantly, suspicious for disease progression. -I recommended changing his chemotherapy to FOLFIRI and Avastin started on 10/02/16. --He is restaging CT scan was delayed a few times, finally was done on 10/15/16, which showed worsening hepatic metastatic burden  and new right hydronephrosis secondary to ureter obstruction, possible related to mesenteric metastasis which is new. I have changed his chemotherapy regiment a few weeks before the scan. -Lab reviewed, ANC 1.4 TODAY. We'll proceed with 2nd cycle FOLFIRI and Avastin today, no 5-fu bolus  -We again reviewed the potential side effects form chemotherapy and management strategy, especially diarrhea, nausea and neutropenia fever.  2. Right ureter obstruction and hydronephrosis -This was found on the recent CT scan. His creatinine is 1.1 today -This is likely secondary to his new peritoneal metastasis -Urgent referral to urology, to be seen within a week. He would need a ureteral stent, if not successful, he may need nephrostomy tube   3. Genetics -per NCCN guideline, due to his young age and family history of pancreatic cancer, he is qualified for genetic testing to ruled out inheritable genetic syndromes. -His genetic testing was negative. APC VUS called [id number] found on the colorectal cancer panel.  The Colorectal Cancer Panel offered by [facility] includes sequencing and/or duplication/deletion testing of the following 19 genes: APC, ATM, AXIN2, BMPR1A, CDH1, CHEK2, EPCAM, MLH1, MSH2, MSH6, MUTYH, PMS2, POLD1, POLE, PTEN, SCG5/GREM1, SMAD4, STK11, and TP53.  4.  HTN, DM, CAD, morbid obesity -he will continue follow-up with his primary care physician -We will  monitor his blood pressure, glucose closely during the chemotherapy, and may need to adjust his the medication if needed. -I strongly encouraged him to see Dr. Brigitte Pulse to adjust his diabetic medication due to his uncontrolled hyperglycemia and hypertension --his BG again is high, 204 today, he will refill his glipizide, and monitor his BG at home. -We previously discussed his premedication dexamethasone will increase his blood sugar, diabetic diet discussed again.  5. History of alcohol abuse -He has stopped alcohol completely since he started chemotherapy.   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  -Near resolved now  7. Coping and social issues  -The patient is having a hard time with his diagnosis.  -I previously offered social work to help whenever he is in need of it. He declined at that time.  8. Goal of care discussion  -We again discussed the incurable nature of his cancer, and the overall poor prognosis, especially if he does not have good response to chemotherapy or progress on chemo -The patient understands the goal of care is palliative. -I recommend DNR/DNI, he will think about it   Plan -Lab reviewed; The patient will receive 2nd infusion FOLFIRI and Avastin today. No 5-fu bolus due to low ANC -Urgent referral to urology, needs to be seen within one week -lab, flush, f/u and chemo in one week  All questions were answered. The patient knows to call the clinic with any problems, questions or concerns.  I spent 20 minutes counseling the patient face to face. The total time spent in the appointment was 25 minutes and more than 50% was on counseling.   Truitt Merle, MD 10/16/2016  This document serves as a record of services personally performed by Truitt Merle, MD. It was created on her behalf by Darcus Austin, a trained medical scribe. The creation of this record is based on the scribe's personal observations and the provider's statements to them. This  document has been checked and approved by the attending provider.

## 2016-10-14 ENCOUNTER — Other Ambulatory Visit: Payer: Self-pay | Admitting: Family Medicine

## 2016-10-14 ENCOUNTER — Telehealth: Payer: Self-pay | Admitting: Hematology

## 2016-10-14 NOTE — Telephone Encounter (Signed)
Confirmed appointments with patient.

## 2016-10-15 ENCOUNTER — Telehealth: Payer: Self-pay | Admitting: *Deleted

## 2016-10-15 ENCOUNTER — Encounter (HOSPITAL_COMMUNITY): Payer: Self-pay

## 2016-10-15 ENCOUNTER — Ambulatory Visit (HOSPITAL_COMMUNITY)
Admission: RE | Admit: 2016-10-15 | Discharge: 2016-10-15 | Disposition: A | Discharge status: Home / Self Care | Payer: BLUE CROSS/BLUE SHIELD | Source: Ambulatory Visit | Attending: Hematology | Admitting: Hematology

## 2016-10-15 DIAGNOSIS — N1330 Unspecified hydronephrosis: Secondary | ICD-10-CM | POA: Insufficient documentation

## 2016-10-15 DIAGNOSIS — K8020 Calculus of gallbladder without cholecystitis without obstruction: Secondary | ICD-10-CM | POA: Diagnosis not present

## 2016-10-15 DIAGNOSIS — I2510 Atherosclerotic heart disease of native coronary artery without angina pectoris: Secondary | ICD-10-CM | POA: Insufficient documentation

## 2016-10-15 DIAGNOSIS — N134 Hydroureter: Secondary | ICD-10-CM | POA: Insufficient documentation

## 2016-10-15 DIAGNOSIS — C787 Secondary malignant neoplasm of liver and intrahepatic bile duct: Secondary | ICD-10-CM | POA: Diagnosis not present

## 2016-10-15 DIAGNOSIS — M533 Sacrococcygeal disorders, not elsewhere classified: Secondary | ICD-10-CM | POA: Diagnosis not present

## 2016-10-15 DIAGNOSIS — M47814 Spondylosis without myelopathy or radiculopathy, thoracic region: Secondary | ICD-10-CM | POA: Diagnosis not present

## 2016-10-15 DIAGNOSIS — C186 Malignant neoplasm of descending colon: Secondary | ICD-10-CM | POA: Insufficient documentation

## 2016-10-15 MED ORDER — IOPAMIDOL (ISOVUE-300) INJECTION 61%
INTRAVENOUS | Status: AC
  Filled 2016-10-15: qty 100

## 2016-10-15 MED ORDER — IOPAMIDOL (ISOVUE-300) INJECTION 61%
INTRAVENOUS | Status: AC
  Filled 2016-10-15: qty 150

## 2016-10-15 MED ORDER — IOPAMIDOL (ISOVUE-300) INJECTION 61%
150.0000 mL | Freq: Once | INTRAVENOUS | Status: AC | PRN
  Administered 2016-10-15: 125 mL via INTRAVENOUS

## 2016-10-15 NOTE — Telephone Encounter (Signed)
Received call report of CT scans done today.  Report printed and left on Dr. Ernestina Penna office for review on 10/16/16 when md returns to office.  Pt has office visit on 10/16/16 .

## 2016-10-16 ENCOUNTER — Ambulatory Visit (HOSPITAL_BASED_OUTPATIENT_CLINIC_OR_DEPARTMENT_OTHER): Payer: BLUE CROSS/BLUE SHIELD

## 2016-10-16 ENCOUNTER — Telehealth: Payer: Self-pay | Admitting: Family Medicine

## 2016-10-16 ENCOUNTER — Other Ambulatory Visit (HOSPITAL_BASED_OUTPATIENT_CLINIC_OR_DEPARTMENT_OTHER): Payer: BLUE CROSS/BLUE SHIELD

## 2016-10-16 ENCOUNTER — Ambulatory Visit (HOSPITAL_BASED_OUTPATIENT_CLINIC_OR_DEPARTMENT_OTHER): Payer: BLUE CROSS/BLUE SHIELD | Admitting: Hematology

## 2016-10-16 ENCOUNTER — Ambulatory Visit: Payer: BLUE CROSS/BLUE SHIELD

## 2016-10-16 VITALS — BP 151/90 | HR 84 | Temp 99.0°F | Resp 18

## 2016-10-16 DIAGNOSIS — I2510 Atherosclerotic heart disease of native coronary artery without angina pectoris: Secondary | ICD-10-CM

## 2016-10-16 DIAGNOSIS — F1021 Alcohol dependence, in remission: Secondary | ICD-10-CM

## 2016-10-16 DIAGNOSIS — I10 Essential (primary) hypertension: Secondary | ICD-10-CM | POA: Diagnosis not present

## 2016-10-16 DIAGNOSIS — G620 Drug-induced polyneuropathy: Secondary | ICD-10-CM

## 2016-10-16 DIAGNOSIS — E1165 Type 2 diabetes mellitus with hyperglycemia: Secondary | ICD-10-CM

## 2016-10-16 DIAGNOSIS — Z5111 Encounter for antineoplastic chemotherapy: Secondary | ICD-10-CM

## 2016-10-16 DIAGNOSIS — N135 Crossing vessel and stricture of ureter without hydronephrosis: Secondary | ICD-10-CM | POA: Diagnosis not present

## 2016-10-16 DIAGNOSIS — N1330 Unspecified hydronephrosis: Secondary | ICD-10-CM

## 2016-10-16 DIAGNOSIS — C19 Malignant neoplasm of rectosigmoid junction: Secondary | ICD-10-CM

## 2016-10-16 DIAGNOSIS — C186 Malignant neoplasm of descending colon: Secondary | ICD-10-CM

## 2016-10-16 DIAGNOSIS — C787 Secondary malignant neoplasm of liver and intrahepatic bile duct: Secondary | ICD-10-CM | POA: Diagnosis not present

## 2016-10-16 DIAGNOSIS — Z5112 Encounter for antineoplastic immunotherapy: Secondary | ICD-10-CM

## 2016-10-16 LAB — CBC WITH DIFFERENTIAL/PLATELET
BASO%: 0.8 % (ref 0.0–2.0)
Basophils Absolute: 0 10*3/uL (ref 0.0–0.1)
EOS ABS: 0.4 10*3/uL (ref 0.0–0.5)
EOS%: 9.1 % — ABNORMAL HIGH (ref 0.0–7.0)
HEMATOCRIT: 36 % — AB (ref 38.4–49.9)
HGB: 12.6 g/dL — ABNORMAL LOW (ref 13.0–17.1)
LYMPH%: 46.5 % (ref 14.0–49.0)
MCH: 34.2 pg — ABNORMAL HIGH (ref 27.2–33.4)
MCHC: 34.9 g/dL (ref 32.0–36.0)
MCV: 98 fL (ref 79.3–98.0)
MONO#: 0.4 10*3/uL (ref 0.1–0.9)
MONO%: 9.8 % (ref 0.0–14.0)
NEUT%: 33.8 % — ABNORMAL LOW (ref 39.0–75.0)
NEUTROS ABS: 1.4 10*3/uL — AB (ref 1.5–6.5)
PLATELETS: 335 10*3/uL (ref 140–400)
RBC: 3.67 10*6/uL — ABNORMAL LOW (ref 4.20–5.82)
RDW: 15.3 % — ABNORMAL HIGH (ref 11.0–14.6)
WBC: 4.1 10*3/uL (ref 4.0–10.3)
lymph#: 1.9 10*3/uL (ref 0.9–3.3)

## 2016-10-16 LAB — COMPREHENSIVE METABOLIC PANEL
ALBUMIN: 3.4 g/dL — AB (ref 3.5–5.0)
ALK PHOS: 79 U/L (ref 40–150)
ALT: 15 U/L (ref 0–55)
ANION GAP: 12 meq/L — AB (ref 3–11)
AST: 18 U/L (ref 5–34)
BUN: 10.6 mg/dL (ref 7.0–26.0)
CALCIUM: 9.5 mg/dL (ref 8.4–10.4)
CO2: 25 mEq/L (ref 22–29)
CREATININE: 1.1 mg/dL (ref 0.7–1.3)
Chloride: 103 mEq/L (ref 98–109)
EGFR: 81 mL/min/{1.73_m2} — ABNORMAL LOW (ref >90)
Glucose: 204 mg/dl — ABNORMAL HIGH (ref 70–140)
Potassium: 3.8 mEq/L (ref 3.5–5.1)
Sodium: 139 mEq/L (ref 136–145)
TOTAL PROTEIN: 7.6 g/dL (ref 6.4–8.3)
Total Bilirubin: 0.52 mg/dL (ref 0.20–1.20)

## 2016-10-16 MED ORDER — SODIUM CHLORIDE 0.9 % IV SOLN
Freq: Once | INTRAVENOUS | Status: AC
  Administered 2016-10-16: 11:00:00 via INTRAVENOUS

## 2016-10-16 MED ORDER — ATROPINE SULFATE 1 MG/ML IJ SOLN
0.5000 mg | Freq: Once | INTRAMUSCULAR | Status: AC | PRN
  Administered 2016-10-16: 0.5 mg via INTRAVENOUS

## 2016-10-16 MED ORDER — DEXAMETHASONE SODIUM PHOSPHATE 10 MG/ML IJ SOLN
INTRAMUSCULAR | Status: AC
  Filled 2016-10-16: qty 1

## 2016-10-16 MED ORDER — BASAGLAR KWIKPEN 100 UNIT/ML ~~LOC~~ SOPN: PEN_INJECTOR | SUBCUTANEOUS | 5 refills | Status: DC

## 2016-10-16 MED ORDER — SODIUM CHLORIDE 0.9 % IV SOLN
2400.0000 mg/m2 | INTRAVENOUS | Status: DC
  Administered 2016-10-16: 7350 mg via INTRAVENOUS
  Filled 2016-10-16: qty 147

## 2016-10-16 MED ORDER — ATROPINE SULFATE 1 MG/ML IJ SOLN
INTRAMUSCULAR | Status: AC
  Filled 2016-10-16: qty 1

## 2016-10-16 MED ORDER — PALONOSETRON HCL INJECTION 0.25 MG/5ML
INTRAVENOUS | Status: AC
  Filled 2016-10-16: qty 5

## 2016-10-16 MED ORDER — IRINOTECAN HCL CHEMO INJECTION 100 MG/5ML
180.0000 mg/m2 | Freq: Once | INTRAVENOUS | Status: AC
  Administered 2016-10-16: 560 mg via INTRAVENOUS
  Filled 2016-10-16: qty 15

## 2016-10-16 MED ORDER — SODIUM CHLORIDE 0.9 % IV SOLN
5.1000 mg/kg | Freq: Once | INTRAVENOUS | Status: AC
  Administered 2016-10-16: 900 mg via INTRAVENOUS
  Filled 2016-10-16: qty 32

## 2016-10-16 MED ORDER — DEXAMETHASONE SODIUM PHOSPHATE 10 MG/ML IJ SOLN
10.0000 mg | Freq: Once | INTRAMUSCULAR | Status: AC
  Administered 2016-10-16: 10 mg via INTRAVENOUS

## 2016-10-16 MED ORDER — SODIUM CHLORIDE 0.9 % IV SOLN
400.0000 mg/m2 | Freq: Once | INTRAVENOUS | Status: AC
  Administered 2016-10-16: 1224 mg via INTRAVENOUS
  Filled 2016-10-16: qty 61.2

## 2016-10-16 MED ORDER — FLUOROURACIL CHEMO INJECTION 2.5 GM/50ML: 400.0000 mg/m2 | Freq: Once | INTRAVENOUS | Status: DC

## 2016-10-16 MED ORDER — PALONOSETRON HCL INJECTION 0.25 MG/5ML
0.2500 mg | Freq: Once | INTRAVENOUS | Status: AC
  Administered 2016-10-16: 0.25 mg via INTRAVENOUS

## 2016-10-16 NOTE — Patient Instructions (Signed)
Implanted Port Home Guide An implanted port is a type of central line that is placed under the skin. Central lines are used to provide IV access when treatment or nutrition needs to be given through a person's veins. Implanted ports are used for long-term IV access. An implanted port may be placed because:  You need IV medicine that would be irritating to the small veins in your hands or arms.  You need long-term IV medicines, such as antibiotics.  You need IV nutrition for a long period.  You need frequent blood draws for lab tests.  You need dialysis.  Implanted ports are usually placed in the chest area, but they can also be placed in the upper arm, the abdomen, or the leg. An implanted port has two main parts:  Reservoir. The reservoir is round and will appear as a small, raised area under your skin. The reservoir is the part where a needle is inserted to give medicines or draw blood.  Catheter. The catheter is a thin, flexible tube that extends from the reservoir. The catheter is placed into a large vein. Medicine that is inserted into the reservoir goes into the catheter and then into the vein.  How will I care for my incision site? Do not get the incision site wet. Bathe or shower as directed by your health care provider. How is my port accessed? Special steps must be taken to access the port:  Before the port is accessed, a numbing cream can be placed on the skin. This helps numb the skin over the port site.  Your health care provider uses a sterile technique to access the port. ? Your health care provider must put on a mask and sterile gloves. ? The skin over your port is cleaned carefully with an antiseptic and allowed to dry. ? The port is gently pinched between sterile gloves, and a needle is inserted into the port.  Only "non-coring" port needles should be used to access the port. Once the port is accessed, a blood return should be checked. This helps ensure that the port  is in the vein and is not clogged.  If your port needs to remain accessed for a constant infusion, a clear (transparent) bandage will be placed over the needle site. The bandage and needle will need to be changed every week, or as directed by your health care provider.  Keep the bandage covering the needle clean and dry. Do not get it wet. Follow your health care provider's instructions on how to take a shower or bath while the port is accessed.  If your port does not need to stay accessed, no bandage is needed over the port.  What is flushing? Flushing helps keep the port from getting clogged. Follow your health care provider's instructions on how and when to flush the port. Ports are usually flushed with saline solution or a medicine called heparin. The need for flushing will depend on how the port is used.  If the port is used for intermittent medicines or blood draws, the port will need to be flushed: ? After medicines have been given. ? After blood has been drawn. ? As part of routine maintenance.  If a constant infusion is running, the port may not need to be flushed.  How long will my port stay implanted? The port can stay in for as long as your health care provider thinks it is needed. When it is time for the port to come out, surgery will be   done to remove it. The procedure is similar to the one performed when the port was put in. When should I seek immediate medical care? When you have an implanted port, you should seek immediate medical care if:  You notice a bad smell coming from the incision site.  You have swelling, redness, or drainage at the incision site.  You have more swelling or pain at the port site or the surrounding area.  You have a fever that is not controlled with medicine.  This information is not intended to replace advice given to you by your health care provider. Make sure you discuss any questions you have with your health care provider. Document  Released: 07/01/2005 Document Revised: 12/07/2015 Document Reviewed: 03/08/2013 Elsevier Interactive Patient Education  2017 [employer]  

## 2016-10-16 NOTE — Telephone Encounter (Signed)
Pt is calling for a refill on his insulin pens please respond she states that he is out an tired of [place] jerking him around about his insulin he very upset fused with me about this

## 2016-10-16 NOTE — Telephone Encounter (Signed)
Insulin called in to [facility] Pt informed pharmacist he is taking 20 units insulin and not 10. Order had to reflect current units.  Dr. Brigitte Pulse, I gave verbal order for pharmacist to increase insulin to 20 units.  Pt states, he is up to 20 units. He ran out today.  Is this okay

## 2016-10-16 NOTE — Progress Notes (Signed)
OK to treat with ANC 1.4 today as per Dr. Burr Medico.

## 2016-10-16 NOTE — Patient Instructions (Addendum)
[facility] Discharge Instructions for Patients Receiving Chemotherapy  Today you received the following chemotherapy agents Camptosar, Leucovorin, 5FU and Avastin.  To help prevent nausea and vomiting after your treatment, we encourage you to take your nausea medication.  If you develop nausea and vomiting that is not controlled by your nausea medication, call the clinic.   BELOW ARE SYMPTOMS THAT SHOULD BE REPORTED IMMEDIATELY:  *FEVER GREATER THAN 100.5 F  *CHILLS WITH OR WITHOUT FEVER  NAUSEA AND VOMITING THAT IS NOT CONTROLLED WITH YOUR NAUSEA MEDICATION  *UNUSUAL SHORTNESS OF BREATH  *UNUSUAL BRUISING OR BLEEDING  TENDERNESS IN MOUTH AND THROAT WITH OR WITHOUT PRESENCE OF ULCERS  *URINARY PROBLEMS  *BOWEL PROBLEMS  UNUSUAL RASH Items with * indicate a potential emergency and should be followed up as soon as possible.  Feel free to call the clinic you have any questions or concerns. The clinic phone number is [phone].  Please show the [facility] at check-in to the Emergency Department and triage nurse.   Neutropenia Neutropenia is a condition that occurs when you have a lower-than-normal level of a type of white blood cell (neutrophil) in your body. Neutrophils are made in the spongy center of large bones (bone marrow) and they fight infections. Neutrophils are your body's main defense against bacterial and fungal infections. The fewer neutrophils you have and the longer your body remains without them, the greater your risk of getting a severe infection. What are the causes? This condition can occur if your body uses up or destroys neutrophils faster than your bone marrow can make them. This problem may happen because of:  Bacterial or fungal infection.  Allergic disorders.  Reactions to some medicines.  Autoimmune disease.  An enlarged spleen. This condition can also occur if your bone marrow does not produce enough neutrophils. This  problem may be caused by:  Cancer.  Cancer treatments, such as radiation or chemotherapy.  Viral infections.  Medicines, such as phenytoin.  Vitamin B12 deficiency.  Diseases of the bone marrow.  Environmental toxins, such as insecticides. What are the signs or symptoms? This condition does not usually cause symptoms. If symptoms are present, they are usually caused by an underlying infection. Symptoms of an infection may include:  Fever.  Chills.  Swollen glands.  Oral or anal ulcers.  Cough and shortness of breath.  Rash.  Skin infection.  Fatigue. How is this diagnosed? Your health care provider may suspect neutropenia if you have:  A condition that may cause neutropenia.  Symptoms of infection, especially fever.  Frequent and unusual infections. You will have a medical history and physical exam. Tests will also be done, such as:  A complete blood count (CBC).  A procedure to collect a sample of bone marrow for examination (bone marrow biopsy).  A chest X-ray.  A urine culture.  A blood culture. How is this treated? Treatment depends on the underlying cause and severity of your condition. Mild neutropenia may not require treatment. Treatment may include medicines, such as:  Antibiotic medicine given through an IV tube.  Antiviral medicines.  Antifungal medicines.  A medicine to increase neutrophil production (colony-stimulating factor). You may get this drug through an IV tube or by injection.  Steroids given through an IV tube. If an underlying condition is causing neutropenia, you may need treatment for that condition. If medicines you are taking are causing neutropenia, your health care provider may have you stop taking those medicines. Follow these instructions at home:  Medicines   Take over-the-counter and prescription medicines only as told by your health care provider.  Get a seasonal flu shot (influenza vaccine). Lifestyle   Do not  eat unpasteurized foods.Do not eat unwashed raw fruits or vegetables.  Avoid exposure to groups of people or children.  Avoid being around people who are sick.  Avoid being around dirt or dust, such as in construction areas or gardens.  Do not provide direct care for pets. Avoid animal droppings. Do not clean litter boxes and bird cages. Hygiene    Bathe daily.  Clean the area between the genitals and the anus (perineal area) after you urinate or have a bowel movement. If you are male, wipe from front to back.  Brush your teeth with a soft toothbrush before and after meals.  Do not use a razor that has a blade. Use an electric razor to remove hair.  Wash your hands often. Make sure others who come in contact with you also wash their hands. If soap and water are not available, use hand sanitizer. General instructions   Do not have sex unless your health care provider has approved.  Take actions to avoid cuts and burns. For example:  Be cautious when you use knives. Always cut away from yourself.  Keep knives in protective sheaths or guards when not in use.  Use oven mitts when you cook with a hot stove, oven, or grill.  Stand a safe distance away from open fires.  Avoid people who received a vaccine in the past 30 days if that vaccine contained a live version of the germ (live vaccine). You should not get a live vaccine. Common live vaccines are varicella, measles, mumps, and rubella.  Do not share food utensils.  Do not use tampons, enemas, or rectal suppositories unless your health care provider has approved.  Keep all appointments as told by your health care provider. This is important. Contact a health care provider if:  You have a fever.  You have chills or you start to shake.  You have:  A sore throat.  A warm, red, or tender area on your skin.  A cough.  Frequent or painful urination.  Vaginal discharge or itching.  You develop:  Sores in your  mouth or anus.  Swollen lymph nodes.  Red streaks on the skin.  A rash.  You feel:  Nauseous or you vomit.  Very fatigued.  Short of breath. This information is not intended to replace advice given to you by your health care provider. Make sure you discuss any questions you have with your health care provider. Document Released: 12/21/2001 Document Revised: 12/07/2015 Document Reviewed: 01/11/2015 Elsevier Interactive Patient Education  2017 [employer].

## 2016-10-16 NOTE — Telephone Encounter (Signed)
Thank you for doing that.  Please call pt and see what cbgs have been. Looks like as of 2 wks ago they were still in the 200s in which case he may need to increase further up to 25u. What are his fastings in the past 2 weeks on average?  What is the lowest he is getting?  Any hypoglycemic episodes?    We will want to continue to titrate his insulin as we discussed at his last visit (see AVS) so I will go ahead and send in a new rx that says up to 40u as directed by physician - I would rather him have extra than run out so he doesn't get into this same frustrating situation again. . . .

## 2016-10-17 MED ORDER — GLUCOSE BLOOD VI STRP: ORAL_STRIP | 3 refills | Status: AC

## 2016-10-17 NOTE — Telephone Encounter (Signed)
Perfect. Thanks.

## 2016-10-17 NOTE — Addendum Note (Signed)
Addended by: Virgia Land on: 10/17/2016 05:35 PM   Modules accepted: Orders

## 2016-10-17 NOTE — Telephone Encounter (Signed)
210-230 No hypo glycemic episodes Will increase to 25u per day.

## 2016-10-18 ENCOUNTER — Ambulatory Visit (HOSPITAL_BASED_OUTPATIENT_CLINIC_OR_DEPARTMENT_OTHER): Payer: BLUE CROSS/BLUE SHIELD

## 2016-10-18 VITALS — BP 136/74 | HR 86 | Temp 97.8°F | Resp 18

## 2016-10-18 DIAGNOSIS — C787 Secondary malignant neoplasm of liver and intrahepatic bile duct: Secondary | ICD-10-CM | POA: Diagnosis not present

## 2016-10-18 DIAGNOSIS — C186 Malignant neoplasm of descending colon: Secondary | ICD-10-CM | POA: Diagnosis not present

## 2016-10-18 MED ORDER — HEPARIN SOD (PORK) LOCK FLUSH 100 UNIT/ML IV SOLN
500.0000 [IU] | Freq: Once | INTRAVENOUS | Status: AC | PRN
  Administered 2016-10-18: 500 [IU]
  Filled 2016-10-18: qty 5

## 2016-10-18 MED ORDER — SODIUM CHLORIDE 0.9% FLUSH
10.0000 mL | INTRAVENOUS | Status: DC | PRN
  Administered 2016-10-18: 10 mL
  Filled 2016-10-18: qty 10

## 2016-10-19 ENCOUNTER — Encounter: Payer: Self-pay | Admitting: Hematology

## 2016-10-23 ENCOUNTER — Telehealth: Payer: Self-pay | Admitting: Hematology

## 2016-10-23 NOTE — Telephone Encounter (Signed)
Left message for patient re next appointment for 4/18 and mailed schedule. Spoke with Mickel Baas, triage nurse at [facility] re urgent referral. Per Mickel Baas she will call patient tomorrow morning to see if he will come in for office visit tomorrow - if not office will arrange appointment with patient.

## 2016-10-28 ENCOUNTER — Other Ambulatory Visit: Payer: Self-pay | Admitting: Family Medicine

## 2016-10-28 NOTE — Progress Notes (Signed)
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   Staging form: Colon and Rectum, AJCC 7th Edition     Pathologic stage from 10/18/2015: Stage IIIA (T2, N1a, cM0) - Signed by Chad Merle, MD on 11/15/2015       Cancer of left colon ([place])   05/11/2015 Imaging    CT ABD/PELVIS: Gallstones w/sludge;liver prominent without focal liver lesion, mild thickening of bladder wall; negative for cancer      05/18/2015 Imaging    [place] ABD: Negative      07/25/2015 Initial Diagnosis    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 8 mm descending polyp and 5 mm rectosigmoid polyp;18-29 mm proximal rectal polyp      07/25/2015 Tumor Marker    CEA=0.7      07/26/2015 Pathology Results    Sigmoid mass: invasive colorectal adenocarcinoma      10/22/2015 Pathologic Stage    T2, N1, MO    #29 nodes examined w/ 1 postive node; moderately differentiated; Negative for Lymph-vascular and Peri-neural invasion; Microscopic extension into muscularis propria      10/26/2015 Pathology Results    MSI Stable      11/30/2015 - 02/08/2016 Chemotherapy    Adjuvant chemotherapy with Capecitabine 2500 mg twice daily, on day  1-14, oxaliplatin 130 mg/m, every 21 days      01/19/2016 Genetic Testing    Patient has genetic testing done for young colon cancer. APC [id number] VUS identified on the Colorectal cancer panel.  The Colorectal Cancer Panel offered by [facility] includes sequencing and/or duplication/deletion testing of the following 19 genes: APC, ATM, AXIN2, BMPR1A, CDH1, CHEK2, EPCAM, MLH1, MSH2, MSH6, MUTYH, PMS2, POLD1, POLE, PTEN, SCG5/GREM1, SMAD4, STK11, and TP53.       05/01/2016 Imaging    Surveillance CT CAP scan showed new more focal low-density lesions in the liver, possible metastasis. No other new lesions.      05/01/2016 Progression    Surveillance CT scan showed probable liver metastasis, confirmed by liver mass biopsy       06/05/2016 Imaging    MR ABDOMEN W WO CONTRAST IMPRESSION: 1. 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 2. No additional metastatic disease in the upper abdomen. 3. Cholelithiasis.      06/24/2016 Pathology Results    US Biopsy Diagnosis Liver, needle/core biopsy, right lobe - METASTATIC ADENOCARCINOMA, CONSISTENT WITH COLORECTAL PRIMARY.      07/16/2016 Miscellaneous    Foundation One results received      07/18/2016 - 09/03/2016 Chemotherapy    Chemo CAPOX, every 3 weeks, started on 07/18/2016 and added Avastin, every 3 weeks, on 08/08/16. Oxaplatin held since 09/03/16 due to severe infusion reaction. Patient was to hold Xeloda from 08/09/16 and restart on 09/04/16.  10/02/2016 -  Chemotherapy    Chemotherapy FOLFIRI and Avastin starting 10/02/16, every 2 weeks      10/15/2016 Imaging    CT CAP w/ contrast 10/15/16 IMPRESSION: 1. Unfortunately there is worsening of the hepatic metastatic burden with generally enlarging and scattered new hepatic metastatic lesions. 2. New right hydronephrosis, hydroureter, and delayed excretion on the right indicating high-grade obstruction of the ureter. This obstruction appears to be near the iliac  vessel cross over where there is abnormal mesenteric stranding and nodularity likely reflecting some residual tumor causing traction on or possibly even invading the right ureter. This may warrant ureteral stenting or nephrostomy. 3. Other imaging findings of potential clinical significance: Stable calcified pleural nodularity on the right, no change from 2014. Coronary atherosclerosis. Cholelithiasis. Stable mild extrahepatic biliary dilatation. Extensive bridging spurring in the thoracolumbar spine and sacroiliac joints.      10/15/2016 Progression    Restaging CT scan showed worsening liver metastasis, high-grade right ureter obstruction and hydronephrosis, concerning for peritoneal metastasis.       HISTORY OF PRESENTING ILLNESS:  Chad [name] 46 y.o. male is here because of recently diagnosed colon cancer.  He is accompanied by his friend to the clinic today.  He presented with abdominal pain since Oct 2016,  Was seen in the emergency room on 05/11/2015,  CT abdomen and pelvis showed  Mild thickening of the urinary bladder wall, gallstones, otherwise negative. He was seen by her primary care physician Dr. Brigitte [name] afterwards, who referred him to GI Dr. Carlean [name].  His father passed away from [place] cancer around that time,  So his appointment was postponed, and he finally had colonoscopy in early January 2017, which showed multiple polyps, and  A ulcerated mass in distal sigmoid colon.  He was subsequently referred to colorectal surgeon Dr. Johney [name], and  Eventually had left colectomy  On 11/01/2015.  He tolerated surgery well, and recovered well. He has good appetite good, BM is normal most of time, occasional diarrhea, likely second to food, mild low abdomen pain, which has improves, he lost about 30 lbs before surgery and 10lbs after surgery.   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  He is divorced, he has a 32 year old daughter who lives  with her mother in [place]. He has sisters and brothers, and her mother who live close by.   CURRENT THERAPY:  Chemotherapy FOLFIRI and Avastin every 2 weeks, started 10/02/16,   INTERIM HISTORY: Chad [name] returns for follow-up and chemo. He tolerated last chemotherapy well overall, he did have moderate nausea and a few episodes of vomiting after chemotherapy, for 4-5 days. No significant diarrhea. Appetite recovered well. He has mild-to-moderate fatigue, able to tolerate routine activities well, he continues working 5-6 days a week. He acknowledges that he is depressed sometime, but does have good support from his prognosis and the mother. He denies suicidal idea. His blood glucose has been better controlled lately, Dr. Brigitte [name] has  increased his insulin lately  MEDICAL HISTORY:  Past Medical History:  Diagnosis Date  . Cancer [facility])    colon cancer  . Coronary artery disease   . Diabetes mellitus without complication ([place]) 07/5174   oral agents only (07/2015)  . ETOH abuse 2014  . Fatty liver 12/2014   on ultrasound. pt obese and abuses ETOH  . Gallstone 12/2014   GB stones and sludge on ultrasound and CT,  . GERD (gastroesophageal reflux disease)   .  Hypertension   . Morbid obesity with BMI of 50.0-59.9, adult ([place]) 03/2013  . Rectal bleeding 05/2015   07/2015 colonoscopy with malignant appearing sigmoid mass. Additional rectal, descending, polyps  . Shoulder pain, bilateral    due to previous injury    SURGICAL HISTORY: Past Surgical History:  Procedure Laterality Date  . CARDIAC CATHETERIZATION N/A 04/06/2015   Procedure: Left Heart Cath and Coronary Angiography;  Surgeon: Belva Crome, MD;  Location: [facility];  Service: Cardiovascular;  Laterality: N/A;  . COLONOSCOPY N/A 07/25/2015   Procedure: COLONOSCOPY;  Surgeon: Chad Mayer, MD;  Location: [facility];  Service: Gastroenterology;  Laterality: N/A;  . CYSTOSCOPY  10/18/2015   Procedure: CYSTOSCOPY FLEXIBLE URETHRAL  DIALATION AND FOLEY PLACEMENT;  Surgeon: Chad Frock, MD;  Location: [facility];  Service: Urology;;  . ESOPHAGOGASTRODUODENOSCOPY N/A 07/25/2015   Procedure: ESOPHAGOGASTRODUODENOSCOPY (EGD);  Surgeon: Chad Mayer, MD;  Location: [facility];  Service: Gastroenterology;  Laterality: N/A;  . PORTACATH PLACEMENT Right 08/01/2016   Procedure: INSERTION PORT-A-CATH WITH ULTRASOUND AND FLUOROSOPIC GUIDANCE - RIGHT INTERNAL JUGULAR;  Surgeon: Chad Boston, MD;  Location: [name];  Service: General;  Laterality: Right;    SOCIAL HISTORY: Social History   Social History  . Marital status: Legally Separated    Spouse name: N/A  . Number of children: 1  . Years of education: N/A   Occupational History  . Manager    Social History Main Topics  . Smoking status: Never Smoker  . Smokeless tobacco: Current User    Types: Chew     Comment: occasionally dips tobacco - has used for approx 20 yrs (12/21/15)  . Alcohol use Yes     Comment: heavy drinking for 10 years, 2-3  drinks of liqour a night, has been cutting back.    . Drug use: No  . Sexual activity: Not on file   Other Topics Concern  . Not on file   Social History Narrative   Divorced, has 57 yo daughter lives in [place]   Lives alone   Manages chain of car wash dealers       FAMILY HISTORY: Family History  Problem Relation Age of Onset  . Diabetes Father   . Pancreatic cancer Father 81    former smoker, but had not smoked in 28 years  . Cancer Maternal Uncle     d. 23s; unknown type of cancer -metastatic throughout abdomen   . Other Brother     oldest brother has had a normal colonoscopy  . Heart attack Maternal Grandfather     d. 57-58; heavy smoker and drinker  . Heart attack Paternal Grandmother     d. early 87s  . Heart attack Paternal Grandfather 69  . Heart failure Maternal Uncle   . Skin cancer Maternal Uncle 42    NOS type; was out in the sun a lot  . Brain cancer Maternal Uncle     NOS type; mother's maternal  half-brother dx. 7-68  . Cirrhosis Paternal Uncle   . Cancer Paternal Uncle     (x2-3) uncles with NOS cancers; d. 26s    ALLERGIES:  has No Known Allergies.  MEDICATIONS:  Current Outpatient Prescriptions  Medication Sig Dispense Refill  . acetaminophen (TYLENOL) 500 MG tablet Take 1,000 mg by mouth every 6 (six) hours as needed for moderate pain or headache.    . Ascorbic Acid (VITAMIN C) 1000 MG tablet Take 1,000 mg by mouth daily.    Marland Kitchen  aspirin EC 81 MG tablet Take 1 tablet (81 mg total) by mouth daily. DO NOT TAKE AGAIN UNTIL 08/09/15    . carvedilol (COREG) 12.5 MG tablet Take 1 tablet (12.5 mg total) by mouth 2 (two) times daily with a meal. 180 tablet 1  . gabapentin (NEURONTIN) 300 MG capsule Take 1 capsule (300 mg total) by mouth at bedtime. 90 capsule 1  . glipiZIDE (GLUCOTROL XL) 5 MG 24 hr tablet TAKE 1 TABLET(5 MG) BY MOUTH DAILY WITH BREAKFAST 90 tablet 0  . glucose blood test strip One touch strips test bid Dx. dm 200 each 3  . Insulin Glargine (BASAGLAR KWIKPEN) 100 UNIT/ML SOPN Inject up to 0.4 mL (40u) into skin nightly, titrate as advised by physician 15 mL 5  . Insulin Pen Needle (PEN NEEDLES) 32G X 4 MM MISC 1 Units by Does not apply route daily. 90 each 4  . lidocaine-prilocaine (EMLA) cream Apply 1 application topically as needed. 30 g 2  . lisinopril-hydrochlorothiazide (PRINZIDE,ZESTORETIC) 20-25 MG tablet TAKE 1 TABLET BY MOUTH DAILY 90 tablet 0  . omeprazole (PRILOSEC) 40 MG capsule TAKE 1 CAPSULE(40 MG) BY MOUTH DAILY 90 capsule 0  . ondansetron (ZOFRAN) 8 MG tablet Take 1 tablet (8 mg total) by mouth every 8 (eight) hours as needed for nausea or vomiting. 30 tablet 0  . prochlorperazine (COMPAZINE) 10 MG tablet Take 1 tablet (10 mg total) by mouth every 6 (six) hours as needed for nausea or vomiting. 30 tablet 2  . traZODone (DESYREL) 50 MG tablet Take 1/2 tab po qam and 1.5 tabs po qhs 180 tablet 1   No current facility-administered medications for this  visit.    Facility-Administered Medications Ordered in Other Visits  Medication Dose Route Frequency Provider Last Rate Last Dose  . sodium chloride flush (NS) 0.9 % injection 10 mL  10 mL Intracatheter PRN Chad Merle, MD   10 mL at 10/18/16 1432    REVIEW OF SYSTEMS:   Constitutional: Denies fevers, chills or abnormal night sweats (+) fatigue Eyes: Denies blurriness of vision, double vision or watery eyes Ears, nose, mouth, throat, and face: Denies mucositis or sore throat Respiratory: Denies cough, dyspnea or wheezes. Cardiovascular: Denies palpitation, chest discomfort or lower extremity swelling Gastrointestinal:  Denies heartburn. (+) Constipation. (+) Nausea Skin: Denies abnormal skin rashes Lymphatics: Denies new lymphadenopathy or easy bruising Neurological:Denies numbness, or new weaknesses Behavioral/Psych: Mood is stable, no new changes  All other systems were reviewed with the patient and are negative.  PHYSICAL EXAMINATION: ECOG PERFORMANCE STATUS: 1 The blood pressure 151/90, heart rate 84, respiratory rate 18, temperature 37.2, [name] ox 99% on room air GENERAL:alert,  Morbidly obese. SKIN: skin color, texture, turgor are normal, no rashes or significant lesions EYES: normal, conjunctiva are pink and non-injected, sclera clear OROPHARYNX:no exudate, no erythema and lips, buccal mucosa, and tongue normal  NECK: supple, thyroid normal size, non-tender, without nodularity LYMPH:  no palpable lymphadenopathy in the cervical, axillary or inguinal LUNGS: clear to auscultation and percussion with normal breathing effort HEART: regular rate & rhythm and no murmurs and no lower extremity edema ABDOMEN: abdomen soft,  Surgical incision and laparoscopic scars are well-healed. non-tender and normal bowel sounds  Musculoskeletal: no cyanosis of digits and no clubbing  PSYCH: alert & oriented x 3 with fluent speech NEURO: no focal motor/sensory deficits  LABORATORY DATA:  I have  reviewed the data as listed CBC Latest Ref Rng & Units 10/30/2016 10/16/2016 10/02/2016  WBC 4.0 - 10.3  10e3/uL 6.9 4.1 10.1  Hemoglobin 13.0 - 17.1 g/dL 12.9(L) 12.6(L) 13.5  Hematocrit 38.4 - 49.9 % 39.1 36.0(L) 39.1  Platelets 140 - 400 10e3/uL 304 335 304    CMP Latest Ref Rng & Units 10/30/2016 10/16/2016 10/02/2016  Glucose 70 - 140 mg/dl 199(H) 204(H) 202(H)  BUN 7.0 - 26.0 mg/dL 15.1 10.6 19.0  Creatinine 0.7 - 1.3 mg/dL 1.1 1.1 1.1  Sodium 136 - 145 mEq/L 140 139 135(L)  Potassium 3.5 - 5.1 mEq/L 4.3 3.8 4.4  Chloride 96 - 106 mmol/L - - -  CO2 22 - 29 mEq/L _0 Calcium 8.4 - 10.4 mg/dL 9.5 9.5 10.0  Total Protein 6.4 - 8.3 g/dL 7.7 7.6 8.3  Total Bilirubin 0.20 - 1.20 mg/dL 0.56 0.52 1.12  Alkaline Phos 40 - 150 U/L 69 79 83  AST 5 - 34 U/L _1 ALT 0 - 55 U/L _2 CEA:  07/25/2015: 0.7 11/30/2015: 4.5 05/01/2016: 57.89 (64.7)  07/18/2016: 329.87 (310.0) 09/03/2016: 589 10/02/2016: 676.76 10/30/2016: 496.12  PATHOLOGY REPORT: US Biopsy 06/24/2016 Diagnosis Liver, needle/core biopsy, right lobe - METASTATIC ADENOCARCINOMA, CONSISTENT WITH COLORECTAL PRIMARY. - SEE COMMENT.  Diagnosis 10/18/2015 1. Colon, segmental resection for tumor, rectosigmoid INFILTRATIVE MODERATELY DIFFERENTIATED ADENOCARCINOMA OF THE COLON (3.8 CM) THE TUMOR INVADES MUSCULARIS PROPRIA MARGINS OF RESECTION ARE NEGATIVE FOR TUMOR METASTATIC COLONIC ADENOCARCINOMA IN ONE OF TWENTY-NINE LYMPH NODES (1/29) 2. Colon, resection margin (donut), final distal margin BENIGN COLONIC TISSUE, NEGATIVE FOR CARCINOMA      Diagnosis 07/25/2015 1. Colon, polyp(s), descending - TUBULAR ADENOMA. NO HIGH GRADE DYSPLASIA OR MALIGNANCY IDENTIFIED. 2. Colon, biopsy, distal sigmoid mass - INVASIVE COLORECTAL ADENOCARCINOMA. 3. Colon, polyp(s), small recto sigmoid - HYPERPLASTIC POLYP. NO ADENOMATOUS CHANGE OR MALIGNANCY. 4. Rectum, polyp(s) - TUBULOVILLOUS ADENOMA. NO HIGH GRADE DYSPLASIA OR  MALIGNANCY IDENTIFIED.   RADIOGRAPHIC STUDIES: I have personally reviewed the radiological images as listed and agreed with the findings in the report.  CT CAP w/ contrast 10/15/16 IMPRESSION: 1. Unfortunately there is worsening of the hepatic metastatic burden with generally enlarging and scattered new hepatic metastatic lesions. 2. New right hydronephrosis, hydroureter, and delayed excretion on the right indicating high-grade obstruction of the ureter. This obstruction appears to be near the iliac vessel cross over where there is abnormal mesenteric stranding and nodularity likely reflecting some residual tumor causing traction on or possibly even invading the right ureter. This may warrant ureteral stenting or nephrostomy. 3. Other imaging findings of potential clinical significance: Stable calcified pleural nodularity on the right, no change from 2014. Coronary atherosclerosis. Cholelithiasis. Stable mild extrahepatic biliary dilatation. Extensive bridging spurring in the thoracolumbar spine and sacroiliac joints.  Abdominal MRI w wo contrast 06/05/2016 IMPRESSION: 1. 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 2. No additional metastatic disease in the upper abdomen. 3. Cholelithiasis.  CT chest, abdomen and pelvis w contrast 05/01/2016 IMPRESSION: 1. Stable chest CT.  No evidence of thoracic metastatic disease. 2. Progressive heterogeneity of the liver could be due to heterogeneous steatosis. However, there are new more focal low-density lesions which are worrisome for possible metastatic disease given the patient's history. Further evaluation with hepatic MRI recommended. 3. Resolving postsurgical retroperitoneal fluid collections status post partial colon resection and anastomosis.  COLONOSCOPY 07/25/2015 ENDOSCOPIC IMPRESSION: 1) 1/4 circumference firm ulcerated mass in distal sigmmoid - approx 22 cm from verge. Looks like cancer. Biopsied. 2) 8  mm descending polyp and 5 mm rectosigmoid polyp removed cold snare, completely  recovered and sent to path. 3) 18-20 mm pedunculated proximal rectal polyp removed hot snare and completely recovered for pathology. 4) Otherwise normal colonoscopy RECOMMENDATIONS: 1. Hold Aspirin and all other NSAIDS for 2 weeks. 2. Will call pathology results and plans though anticipate surgical referral after pathology in. CEA will be drawn at hospital today. He has had recent CT's of chest, and abd/pelvis - will see if he needs new ones - he may.  EGD 07/25/2015 ENDOSCOPIC IMPRESSION: Normal appearing esophagus and GE junction, the stomach was well visualized and normal in appearance, normal appearing duodenum  ASSESSMENT & PLAN:  46 y.o.  Caucasian male, with past medical history of heavy alcohol drinking, hypertension, diabetes, coronary artery disease, presented with abdominal pain.  1.  Cancer of left colon, Distal sigmoid, invasive adenocarcinoma, G2, pT2N1aM0, stage IIIA, MSI-stable, liver metastasis in 04/2016, possible peritoneal mets in 10/2016 -I initially reviewed his CT scan findings, and the surgical pathology findings include details with patient. -he has locally advanced sigmoid colon cancer, with one out of 29 lymph nodes positive. -He has completed 3 months of adjuvant chemotherapy CAPOX, tolerated well. -He unfortunately developed metastatic disease right after he completes adjuvant chemotherapy. MRI confirmed at least a 6 metastatic lesions in the liver, largest 4.2 cm. Giving the multiple liver metastasis, this is likely incurable metastatic cancer. -He underwent ultrasound-guided liver mass biopsy to confirm metastasis, which revealed metastatic adenocarcinoma consistent with a colorectal primary.  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 He is not a candidate for immunotherapy, and would not benefit from EGFR inhibitor. -He was on first-line chemotherapy CAPOX and  avastin, unfortunately he developed severe infusion reaction to oxaliplatin on 08/08/16, and oxliplatin has been held since then.  -I previously recommended changing his chemotherapy to FOLFIRI and Avastin started on 10/02/16. --His restaging CT scan was delayed a few times, finally was done on 10/15/16, which showed worsening hepatic metastatic burden and new right hydronephrosis secondary to ureter obstruction, possible related to mesenteric metastasis which is new. I have changed his chemotherapy regiment a few weeks before the scan. -Lab reviewed,  adequate for treatment, we will proceed with 3nd cycle FOLFIRI and Avastin today, and continue every 2 weeks -We again reviewed the potential side effects form chemotherapy and management strategy, especially nausea and neutropenia fever. -Plan to repeat restaging scans after cycle 6  2. Right ureter obstruction and hydronephrosis -This was found on the recent CT scan. His creatinine is 1.1 today -This is likely secondary to his new peritoneal metastasis -Urgent referral was previously made to urology, to be seen within a week. He was seen by Dr. Tresa Moore before, he had a ureter stent placed during his colon surgery and was subsequently removed.  -He canceled his appointment last week due to the conflict with his work schedule, I strongly encourage him to call Dr. Tresa Moore and reschedule to this week or early next week, he agrees   3. Genetics -per NCCN guideline, due to his young age and family history of pancreatic cancer, he is qualified for genetic testing to ruled out inheritable genetic syndromes. -His genetic testing was negative. APC VUS called [id number] found on the colorectal cancer panel.  The Colorectal Cancer Panel offered by [facility] includes sequencing and/or duplication/deletion testing of the following 19 genes: APC, ATM, AXIN2, BMPR1A, CDH1, CHEK2, EPCAM, MLH1, MSH2, MSH6, MUTYH, PMS2, POLD1, POLE, PTEN, SCG5/GREM1, SMAD4, STK11, and TP53.  4.   HTN, DM, CAD, morbid obesity -he will continue follow-up with his  primary care physician -We will monitor his blood pressure, glucose closely during the chemotherapy, and may need to adjust his the medication if needed. -I strongly encouraged him to see Dr. Brigitte [name] to adjust his diabetic medication due to his uncontrolled hyperglycemia and hypertension --his BG again is high, 204 today, he will refill his glipizide, and monitor his BG at home. -We previously discussed his premedication dexamethasone will increase his blood sugar, diabetic diet discussed again.  5. History of alcohol abuse -He has stopped alcohol completely since he started chemotherapy.   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  -Near resolved now  7. Coping and social issues  -The patient is having a hard time with his diagnosis.  -I previously offered social work to help whenever he is in need of it. He declined at that time.  8. Depression  -he has been depressed lately due to his cancer progression  -He states he has good social support from his progress in the muscle, he denies suicidal idea. He declined antidepressant medication -I encouraged him to Dr. our Education officer, museum for counseling, he will think about it.  9. Goal of care discussion  -We again discussed the incurable nature of his cancer, and the overall poor prognosis, especially if he does not have good response to chemotherapy or progress on chemo -The patient understands the goal of care is palliative. -I recommend DNR/DNI, he will think about it   Plan -Lab reviewed; The patient will receive 3rd infusion FOLFIRI and Avastin today.  -He will call Dr. Tresa Moore and reschedule his appointment as soon as possible -lab, flush, f/u and chemo in 2, 4 and 6 weeks -I will see him back in 2 weeks   All questions were answered. The patient knows to call the clinic with any problems, questions or concerns.  I spent 20 minutes counseling  the patient face to face. The total time spent in the appointment was 25 minutes and more than 50% was on counseling.   Chad Merle, MD 10/30/2016

## 2016-10-28 NOTE — Telephone Encounter (Signed)
Pt has appt scheduled with me on 4/23.  CMP on 4/4 was nml and bp labile but within range

## 2016-10-30 ENCOUNTER — Ambulatory Visit (HOSPITAL_BASED_OUTPATIENT_CLINIC_OR_DEPARTMENT_OTHER): Payer: BLUE CROSS/BLUE SHIELD | Admitting: Hematology

## 2016-10-30 ENCOUNTER — Ambulatory Visit: Payer: BLUE CROSS/BLUE SHIELD | Admitting: Nurse Practitioner

## 2016-10-30 ENCOUNTER — Ambulatory Visit (HOSPITAL_BASED_OUTPATIENT_CLINIC_OR_DEPARTMENT_OTHER): Payer: BLUE CROSS/BLUE SHIELD

## 2016-10-30 ENCOUNTER — Other Ambulatory Visit (HOSPITAL_BASED_OUTPATIENT_CLINIC_OR_DEPARTMENT_OTHER): Payer: BLUE CROSS/BLUE SHIELD

## 2016-10-30 ENCOUNTER — Encounter: Payer: Self-pay | Admitting: Hematology

## 2016-10-30 ENCOUNTER — Telehealth: Payer: Self-pay

## 2016-10-30 VITALS — BP 103/61 | HR 87 | Temp 97.7°F | Resp 16 | Ht 75.0 in | Wt 388.0 lb

## 2016-10-30 DIAGNOSIS — C787 Secondary malignant neoplasm of liver and intrahepatic bile duct: Secondary | ICD-10-CM | POA: Diagnosis not present

## 2016-10-30 DIAGNOSIS — N135 Crossing vessel and stricture of ureter without hydronephrosis: Secondary | ICD-10-CM

## 2016-10-30 DIAGNOSIS — F329 Major depressive disorder, single episode, unspecified: Secondary | ICD-10-CM

## 2016-10-30 DIAGNOSIS — C786 Secondary malignant neoplasm of retroperitoneum and peritoneum: Secondary | ICD-10-CM | POA: Diagnosis not present

## 2016-10-30 DIAGNOSIS — E1165 Type 2 diabetes mellitus with hyperglycemia: Secondary | ICD-10-CM

## 2016-10-30 DIAGNOSIS — C186 Malignant neoplasm of descending colon: Secondary | ICD-10-CM

## 2016-10-30 DIAGNOSIS — Z5112 Encounter for antineoplastic immunotherapy: Secondary | ICD-10-CM

## 2016-10-30 DIAGNOSIS — E119 Type 2 diabetes mellitus without complications: Secondary | ICD-10-CM

## 2016-10-30 DIAGNOSIS — Z5111 Encounter for antineoplastic chemotherapy: Secondary | ICD-10-CM | POA: Diagnosis not present

## 2016-10-30 DIAGNOSIS — I10 Essential (primary) hypertension: Secondary | ICD-10-CM | POA: Diagnosis not present

## 2016-10-30 DIAGNOSIS — C19 Malignant neoplasm of rectosigmoid junction: Secondary | ICD-10-CM

## 2016-10-30 DIAGNOSIS — G620 Drug-induced polyneuropathy: Secondary | ICD-10-CM

## 2016-10-30 DIAGNOSIS — I2510 Atherosclerotic heart disease of native coronary artery without angina pectoris: Secondary | ICD-10-CM | POA: Diagnosis not present

## 2016-10-30 LAB — CBC WITH DIFFERENTIAL/PLATELET
BASO%: 0.7 % (ref 0.0–2.0)
Basophils Absolute: 0.1 10*3/uL (ref 0.0–0.1)
EOS%: 2.9 % (ref 0.0–7.0)
Eosinophils Absolute: 0.2 10*3/uL (ref 0.0–0.5)
HEMATOCRIT: 39.1 % (ref 38.4–49.9)
HGB: 12.9 g/dL — ABNORMAL LOW (ref 13.0–17.1)
LYMPH%: 39.7 % (ref 14.0–49.0)
MCH: 32.7 pg (ref 27.2–33.4)
MCHC: 33 g/dL (ref 32.0–36.0)
MCV: 99.2 fL — ABNORMAL HIGH (ref 79.3–98.0)
MONO#: 0.7 10*3/uL (ref 0.1–0.9)
MONO%: 10.7 % (ref 0.0–14.0)
NEUT#: 3.2 10*3/uL (ref 1.5–6.5)
NEUT%: 46 % (ref 39.0–75.0)
Platelets: 304 10*3/uL (ref 140–400)
RBC: 3.94 10*6/uL — ABNORMAL LOW (ref 4.20–5.82)
RDW: 14.3 % (ref 11.0–14.6)
WBC: 6.9 10*3/uL (ref 4.0–10.3)
lymph#: 2.7 10*3/uL (ref 0.9–3.3)

## 2016-10-30 LAB — COMPREHENSIVE METABOLIC PANEL
ALBUMIN: 3.6 g/dL (ref 3.5–5.0)
ALK PHOS: 69 U/L (ref 40–150)
ALT: 19 U/L (ref 0–55)
AST: 18 U/L (ref 5–34)
Anion Gap: 12 mEq/L — ABNORMAL HIGH (ref 3–11)
BUN: 15.1 mg/dL (ref 7.0–26.0)
CO2: 24 mEq/L (ref 22–29)
Calcium: 9.5 mg/dL (ref 8.4–10.4)
Chloride: 104 mEq/L (ref 98–109)
Creatinine: 1.1 mg/dL (ref 0.7–1.3)
EGFR: 83 mL/min/{1.73_m2} — ABNORMAL LOW (ref >90)
GLUCOSE: 199 mg/dL — AB (ref 70–140)
Potassium: 4.3 mEq/L (ref 3.5–5.1)
SODIUM: 140 meq/L (ref 136–145)
TOTAL PROTEIN: 7.7 g/dL (ref 6.4–8.3)
Total Bilirubin: 0.56 mg/dL (ref 0.20–1.20)

## 2016-10-30 LAB — CEA (IN HOUSE-CHCC): CEA (CHCC-In House): 496.12 ng/mL — ABNORMAL HIGH (ref 0.00–5.00)

## 2016-10-30 MED ORDER — SODIUM CHLORIDE 0.9 % IV SOLN
5.1000 mg/kg | Freq: Once | INTRAVENOUS | Status: AC
  Administered 2016-10-30: 900 mg via INTRAVENOUS
  Filled 2016-10-30: qty 32

## 2016-10-30 MED ORDER — SODIUM CHLORIDE 0.9 % IV SOLN
180.0000 mg/m2 | Freq: Once | INTRAVENOUS | Status: AC
  Administered 2016-10-30: 560 mg via INTRAVENOUS
  Filled 2016-10-30: qty 15

## 2016-10-30 MED ORDER — SODIUM CHLORIDE 0.9 % IV SOLN
400.0000 mg/m2 | Freq: Once | INTRAVENOUS | Status: AC
  Administered 2016-10-30: 1224 mg via INTRAVENOUS
  Filled 2016-10-30: qty 61.2

## 2016-10-30 MED ORDER — DEXAMETHASONE SODIUM PHOSPHATE 10 MG/ML IJ SOLN
INTRAMUSCULAR | Status: AC
  Filled 2016-10-30: qty 1

## 2016-10-30 MED ORDER — DEXAMETHASONE SODIUM PHOSPHATE 10 MG/ML IJ SOLN
10.0000 mg | Freq: Once | INTRAMUSCULAR | Status: AC
  Administered 2016-10-30: 10 mg via INTRAVENOUS

## 2016-10-30 MED ORDER — SODIUM CHLORIDE 0.9 % IV SOLN
Freq: Once | INTRAVENOUS | Status: AC
  Administered 2016-10-30: 14:00:00 via INTRAVENOUS

## 2016-10-30 MED ORDER — ATROPINE SULFATE 1 MG/ML IJ SOLN
0.5000 mg | Freq: Once | INTRAMUSCULAR | Status: AC | PRN
  Administered 2016-10-30: 0.5 mg via INTRAVENOUS

## 2016-10-30 MED ORDER — HEPARIN SOD (PORK) LOCK FLUSH 100 UNIT/ML IV SOLN
500.0000 [IU] | Freq: Once | INTRAVENOUS | Status: DC | PRN
  Filled 2016-10-30: qty 5

## 2016-10-30 MED ORDER — ATROPINE SULFATE 1 MG/ML IJ SOLN
INTRAMUSCULAR | Status: AC
  Filled 2016-10-30: qty 1

## 2016-10-30 MED ORDER — PALONOSETRON HCL INJECTION 0.25 MG/5ML
INTRAVENOUS | Status: AC
  Filled 2016-10-30: qty 5

## 2016-10-30 MED ORDER — FLUOROURACIL CHEMO INJECTION 5 GM/100ML
2400.0000 mg/m2 | INTRAVENOUS | Status: DC
  Administered 2016-10-30: 7350 mg via INTRAVENOUS
  Filled 2016-10-30: qty 147

## 2016-10-30 MED ORDER — PALONOSETRON HCL INJECTION 0.25 MG/5ML
0.2500 mg | Freq: Once | INTRAVENOUS | Status: AC
  Administered 2016-10-30: 0.25 mg via INTRAVENOUS

## 2016-10-30 MED ORDER — SODIUM CHLORIDE 0.9% FLUSH
10.0000 mL | INTRAVENOUS | Status: DC | PRN
  Filled 2016-10-30: qty 10

## 2016-10-30 NOTE — Patient Instructions (Signed)
Implanted Port Home Guide An implanted port is a type of central line that is placed under the skin. Central lines are used to provide IV access when treatment or nutrition needs to be given through a person's veins. Implanted ports are used for long-term IV access. An implanted port may be placed because:  You need IV medicine that would be irritating to the small veins in your hands or arms.  You need long-term IV medicines, such as antibiotics.  You need IV nutrition for a long period.  You need frequent blood draws for lab tests.  You need dialysis.  Implanted ports are usually placed in the chest area, but they can also be placed in the upper arm, the abdomen, or the leg. An implanted port has two main parts:  Reservoir. The reservoir is round and will appear as a small, raised area under your skin. The reservoir is the part where a needle is inserted to give medicines or draw blood.  Catheter. The catheter is a thin, flexible tube that extends from the reservoir. The catheter is placed into a large vein. Medicine that is inserted into the reservoir goes into the catheter and then into the vein.  How will I care for my incision site? Do not get the incision site wet. Bathe or shower as directed by your health care provider. How is my port accessed? Special steps must be taken to access the port:  Before the port is accessed, a numbing cream can be placed on the skin. This helps numb the skin over the port site.  Your health care provider uses a sterile technique to access the port. ? Your health care provider must put on a mask and sterile gloves. ? The skin over your port is cleaned carefully with an antiseptic and allowed to dry. ? The port is gently pinched between sterile gloves, and a needle is inserted into the port.  Only "non-coring" port needles should be used to access the port. Once the port is accessed, a blood return should be checked. This helps ensure that the port  is in the vein and is not clogged.  If your port needs to remain accessed for a constant infusion, a clear (transparent) bandage will be placed over the needle site. The bandage and needle will need to be changed every week, or as directed by your health care provider.  Keep the bandage covering the needle clean and dry. Do not get it wet. Follow your health care provider's instructions on how to take a shower or bath while the port is accessed.  If your port does not need to stay accessed, no bandage is needed over the port.  What is flushing? Flushing helps keep the port from getting clogged. Follow your health care provider's instructions on how and when to flush the port. Ports are usually flushed with saline solution or a medicine called heparin. The need for flushing will depend on how the port is used.  If the port is used for intermittent medicines or blood draws, the port will need to be flushed: ? After medicines have been given. ? After blood has been drawn. ? As part of routine maintenance.  If a constant infusion is running, the port may not need to be flushed.  How long will my port stay implanted? The port can stay in for as long as your health care provider thinks it is needed. When it is time for the port to come out, surgery will be   done to remove it. The procedure is similar to the one performed when the port was put in. When should I seek immediate medical care? When you have an implanted port, you should seek immediate medical care if:  You notice a bad smell coming from the incision site.  You have swelling, redness, or drainage at the incision site.  You have more swelling or pain at the port site or the surrounding area.  You have a fever that is not controlled with medicine.  This information is not intended to replace advice given to you by your health care provider. Make sure you discuss any questions you have with your health care provider. Document  Released: 07/01/2005 Document Revised: 12/07/2015 Document Reviewed: 03/08/2013 Elsevier Interactive Patient Education  2017 [employer]  

## 2016-10-30 NOTE — Telephone Encounter (Signed)
Received a request for one touch ultra blue from cover my meds.  Key Code is [id number].   I filled out form and marked as an urgent request because of patient's current health issues.  Should get a response within 24 hrs.

## 2016-10-30 NOTE — Patient Instructions (Signed)
Chad [name] Discharge Instructions for Patients Receiving Chemotherapy  Today you received the following chemotherapy agents Camptosar, Leucovorin, 5FU and Avastin.  To help prevent nausea and vomiting after your treatment, we encourage you to take your nausea medication.  If you develop nausea and vomiting that is not controlled by your nausea medication, call the clinic.   BELOW ARE SYMPTOMS THAT SHOULD BE REPORTED IMMEDIATELY:  *FEVER GREATER THAN 100.5 F  *CHILLS WITH OR WITHOUT FEVER  NAUSEA AND VOMITING THAT IS NOT CONTROLLED WITH YOUR NAUSEA MEDICATION  *UNUSUAL SHORTNESS OF BREATH  *UNUSUAL BRUISING OR BLEEDING  TENDERNESS IN MOUTH AND THROAT WITH OR WITHOUT PRESENCE OF ULCERS  *URINARY PROBLEMS  *BOWEL PROBLEMS  UNUSUAL RASH Items with * indicate a potential emergency and should be followed up as soon as possible.  Feel free to call the clinic you have any questions or concerns. The clinic phone number is [phone].  Please show the [facility] [facility] at check-in to the Emergency Department and triage nurse.   Neutropenia Neutropenia is a condition that occurs when you have a lower-than-normal level of a type of white blood cell (neutrophil) in your body. Neutrophils are made in the spongy center of large bones (bone marrow) and they fight infections. Neutrophils are your body's main defense against bacterial and fungal infections. The fewer neutrophils you have and the longer your body remains without them, the greater your risk of getting a severe infection. What are the causes? This condition can occur if your body uses up or destroys neutrophils faster than your bone marrow can make them. This problem may happen because of:  Bacterial or fungal infection.  Allergic disorders.  Reactions to some medicines.  Autoimmune disease.  An enlarged spleen. This condition can also occur if your bone marrow does not produce enough neutrophils. This  problem may be caused by:  Cancer.  Cancer treatments, such as radiation or chemotherapy.  Viral infections.  Medicines, such as phenytoin.  Vitamin B12 deficiency.  Diseases of the bone marrow.  Environmental toxins, such as insecticides. What are the signs or symptoms? This condition does not usually cause symptoms. If symptoms are present, they are usually caused by an underlying infection. Symptoms of an infection may include:  Fever.  Chills.  Swollen glands.  Oral or anal ulcers.  Cough and shortness of breath.  Rash.  Skin infection.  Fatigue. How is this diagnosed? Your health care provider may suspect neutropenia if you have:  A condition that may cause neutropenia.  Symptoms of infection, especially fever.  Frequent and unusual infections. You will have a medical history and physical exam. Tests will also be done, such as:  A complete blood count (CBC).  A procedure to collect a sample of bone marrow for examination (bone marrow biopsy).  A chest X-ray.  A urine culture.  A blood culture. How is this treated? Treatment depends on the underlying cause and severity of your condition. Mild neutropenia may not require treatment. Treatment may include medicines, such as:  Antibiotic medicine given through an IV tube.  Antiviral medicines.  Antifungal medicines.  A medicine to increase neutrophil production (colony-stimulating factor). You may get this drug through an IV tube or by injection.  Steroids given through an IV tube. If an underlying condition is causing neutropenia, you may need treatment for that condition. If medicines you are taking are causing neutropenia, your health care provider may have you stop taking those medicines. Follow these instructions at home:  Medicines   Take over-the-counter and prescription medicines only as told by your health care provider.  Get a seasonal flu shot (influenza vaccine). Lifestyle   Do not  eat unpasteurized foods.Do not eat unwashed raw fruits or vegetables.  Avoid exposure to groups of people or children.  Avoid being around people who are sick.  Avoid being around dirt or dust, such as in construction areas or gardens.  Do not provide direct care for pets. Avoid animal droppings. Do not clean litter boxes and bird cages. Hygiene    Bathe daily.  Clean the area between the genitals and the anus (perineal area) after you urinate or have a bowel movement. If you are male, wipe from front to back.  Brush your teeth with a soft toothbrush before and after meals.  Do not use a razor that has a blade. Use an electric razor to remove hair.  Wash your hands often. Make sure others who come in contact with you also wash their hands. If soap and water are not available, use hand sanitizer. General instructions   Do not have sex unless your health care provider has approved.  Take actions to avoid cuts and burns. For example:  Be cautious when you use knives. Always cut away from yourself.  Keep knives in protective sheaths or guards when not in use.  Use oven mitts when you cook with a hot stove, oven, or grill.  Stand a safe distance away from open fires.  Avoid people who received a vaccine in the past 30 days if that vaccine contained a live version of the germ (live vaccine). You should not get a live vaccine. Common live vaccines are varicella, measles, mumps, and rubella.  Do not share food utensils.  Do not use tampons, enemas, or rectal suppositories unless your health care provider has approved.  Keep all appointments as told by your health care provider. This is important. Contact a health care provider if:  You have a fever.  You have chills or you start to shake.  You have:  A sore throat.  A warm, red, or tender area on your skin.  A cough.  Frequent or painful urination.  Vaginal discharge or itching.  You develop:  Sores in your  mouth or anus.  Swollen lymph nodes.  Red streaks on the skin.  A rash.  You feel:  Nauseous or you vomit.  Very fatigued.  Short of breath. This information is not intended to replace advice given to you by your health care provider. Make sure you discuss any questions you have with your health care provider. Document Released: 12/21/2001 Document Revised: 12/07/2015 Document Reviewed: 01/11/2015 Elsevier Interactive Patient Education  2017 [employer].

## 2016-10-31 ENCOUNTER — Telehealth: Payer: Self-pay | Admitting: Hematology

## 2016-10-31 NOTE — Telephone Encounter (Signed)
Patient bypassed scheduling/check out on 10/30/16. Appointments scheduled per 10/30/16 los. Copy of appointment schedule and letter was mailed to patient.

## 2016-11-01 ENCOUNTER — Ambulatory Visit (HOSPITAL_BASED_OUTPATIENT_CLINIC_OR_DEPARTMENT_OTHER): Payer: BLUE CROSS/BLUE SHIELD

## 2016-11-01 VITALS — BP 162/80 | HR 83 | Temp 97.7°F | Resp 18

## 2016-11-01 DIAGNOSIS — C786 Secondary malignant neoplasm of retroperitoneum and peritoneum: Secondary | ICD-10-CM

## 2016-11-01 DIAGNOSIS — C186 Malignant neoplasm of descending colon: Secondary | ICD-10-CM | POA: Diagnosis not present

## 2016-11-01 DIAGNOSIS — C787 Secondary malignant neoplasm of liver and intrahepatic bile duct: Secondary | ICD-10-CM

## 2016-11-01 MED ORDER — SODIUM CHLORIDE 0.9% FLUSH
10.0000 mL | INTRAVENOUS | Status: DC | PRN
  Administered 2016-11-01: 10 mL
  Filled 2016-11-01: qty 10

## 2016-11-01 MED ORDER — HEPARIN SOD (PORK) LOCK FLUSH 100 UNIT/ML IV SOLN
500.0000 [IU] | Freq: Once | INTRAVENOUS | Status: AC | PRN
  Administered 2016-11-01: 500 [IU]
  Filled 2016-11-01: qty 5

## 2016-11-03 NOTE — Progress Notes (Signed)
Subjective:    Patient ID: Chad [name], male    DOB: 12/02/1970, 46 y.o.   MRN: [id number] Chief Complaint  Patient presents with  . Toe issue    L fourth toe. Infection ?    HPI  Chad [name] is a 46 year old male who his here today with concerns about his toe. I last saw patient 3 months prior for a review of his chronic medical problems and he was newly started on insulin at that time.  DMII: Uncontrolled. hgba1c 6.9-> 7.2 -> 8.3-> 9.7 at last visit 3 mos ago so Chad [name] agreed to start insulin and stop his prior regimen of glipizide xl 71m qam metformin 2g qd. As he was insulin nave and I was worried about hypoglycemia with the former and exacerbation of chemo nausea and diarrhea with the latter. Throughout the course of his chemotherapy, his blood sugars have become progressively elevated despite significant improvement in diet with less fast food and sugar, more veggies, trying for frequent small high protein snacks like yogurt, and cessation of EtOH. However, I think between the pre-medicating with dexamethasone prior to chemotherapy and the nausea and decreased appetite after chemo in the setting of so much physical, mental, and emotional stress made it impossible to have a chance to control his blood sugars by using a static regimen of oral diabetic medication alone. Patient was offered endocrine referral for this but declined for now. Patient started basically R 10 units daily at bedtime with instructions to titrate up by 2 units every 3 days until fasting a.m. CBGs were < 150. Patient titrated basically are up to 20 units daily but still reported fasting CBGs 210 to 230 with no hypoglycemic episodes 3 weeks prior so instructed patient to increase to 25 units Several days prior Chad [name] SAnnie Mainrestart his glipizide as daytime cbgs were still >200.  + microalb 11/2014. eGFR 83. Optho? Referred to Dr. GKaty FitchMay 2016.  Patient had to have dental surgery in mid March as the  chemotherapy had hollowed out his teeth- by Dr. SAstrid Drafts- did need a few vicodin for pain control immed after. Trying to substitute a protein shake for lunch to avoid afternoon spikes which can get up to 230.  Morning sugars have dropped to 160s-170s.  He did see a blood sugar down to 100 when he felt a little dizzy after he had a salad for dinner, eggs from breakfast, protein shake for lunch but then up to 150 when he ate orange.  After dinner he tends to see the best about 130s sometimes. He states he has possible woken up with some hypoglycemic.  He is taking glipizide 555mXL mg po qd and then started the metformin after his dexamethasone it is in the 300s-400s and lots of nausea but doesn't think metformin   HTN: Uncontrolled and labile. On lisinopril-hctz 20-25 and coreg which was increased from 6.25 to 12.5 bid at last visit with poss some improvement in pressures. So labile it is difficult to assess with pressures over the past 3 mos since the dose increase ranging from 103-171/58-90 with no real identifiable baseline but does have a sig portion of reads in 130s/70s. 128/74 2d prior - checks 3 times/wk Chronic systolic HF: CAD: Taking asa 8188md. Morbid Obesity:  HLD: Jan 2018 LDL 24 and non-HDL 98 so at goal though does have high-carb/metabolic pattern with trig of 371 and HDL 28. Stopped pravastatin 40 about 18 mos prior during w/u of colon  cancer and has not restarted GERD: on omeprazole 38m qd. 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  Hepatic Steatosis: Now complicated by worsening liver mets. recent lfts with minimal elevation and liver functioning well.  Colon cancer: incurable metastatic Adenocarcinoma followed by Dr. FeBurr MedicoHis liver mets have continues to worsen after several rounds of chemo and earlier this month a restaging CT obtained after noting increasing CEA showed new peritoneal metastases obstructing the right ureter and causing hydronephrosis. His  chemotherapy regimen had been changed just prior to this and he is currently through 3 of the every 2 week cycles of the 6 planned prior to repeat staging scans.   Right ureter obstruction with right hydronephrosis: [facility] if he might be able to have stent placed in right ureter  Occ trouble getting stream started but not significantly different  Depression: Did notice improvement with starting trazodone 25 mg every morning and 75 mg daily at bedtime as needed which he is rarely using. Pt was using prn lorazepam 27m28mhs about half of days to sleep since developing severe persistent insomnia due to the stress after the cancer diagnosis but he has not filled a rx for this in over 4 months now. He has been gettoing to bed earlier as he is working so much - going to bed about 8:30 and someitmes wakes up about 4-5 but feel good Does have mood sxs secondary to poor prognosis and continue progression of cancer despite now on third chemotherapy regimen and status post colectomy. The fact that all of this chemotherapy he is going through is palliative in nature only as his cancer is incurable is incredibly depressing which patient admits but he seems to be continuing to put his best foot forward as best as he knows how despite this. He does have support of his siblings and mother who are in the area. Patient has declined all antidepressant medication, social work,and therapy referrals offered prior.  He has not requested DNR/DNI status though this was recommended by his oncologist and he agreed to consider it.  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  Review of Systems     Objective:   Physical Exam        Assessment & Plan:  May want to reconsider referral to  endocrinology for management of labile insulin-dependent diabetes if needed/desired in future. refer to optho? Needs foot exam  Check bp outside office? Titrate insulin - need to add short acting?  Depression: Considering how noncompliant patient was with his chronic medical illnesses prior to his cancer diagnosis and his history of daily alcohol overuse and abuse which he only stopped upon his cancer diagnosis, I am actually rather impressed by patient's efforts at medication compliance which while poor are significantly improved from prior and the fact that he has been able to be abstinent from alcohol since 05/2015 after so many years of daily drinking was quite impressive  Needs pneumovax, flu shot, and tetanus but defer until after chemo is completed as unlikely to have adequate immune response  Today I have utilized the [place] Controlled Substance Registry's online query to confirm compliance regarding the patient's narcotic pain medications. My review reveals appropriate prescription fills and that [facility]  is the sole provider of these medications. Rechecks will occur regularly and the patient is aware of our use of the system.  CHANGE THE DIABETIC REG TO JANUMET IF OK ON THE LIVER  1. Type 2 diabetes mellitus with hyperglycemia, with long-term current use of insulin (HCC) - Doing much better on Basaglar 25u qhs. Stop glipizide XL 73m qd due to risk of hypoglycemia. Pt tolerated metformin fine prior so will start pt on Janumet 50/1000mg bid. Advised pt to titrate basiglar insulin down as cbgs lower with oral med - he is very comfortable with this process and gets the first hypoglycemic sxs at cbg 100 so has early warning. Reviewed ability to increase insulin by 10 or 15u on day1-3 of dexamethosone pretreatment for his chemo but he is reluctant - thinks no amount of insulin will budge those highs. Discussed decreasing insulin by 5u  2. History of alcohol abuse   3.  Neuropathy due to type 2 diabetes mellitus ([place]   4. Microalbuminuria due to type 2 diabetes mellitus ([place]   5. Skin ulcer of left foot with fat layer exposed ([place]   6. Diabetic ulcer of toe of left foot associated with type 2 diabetes mellitus, with fat layer exposed ([place] - discussed case with SIvar Drape PA-C - she will see pt tomorrow morning in office. Get CBC and xray to screen for osteomyelitis. Rec digital block and debridement of necrotic tissue then xeroform. Recheck in sev d for wound care. If worsening, will refer to podiatry but pt declines for now. Advised above today Start high dose Bactrim DS 2 tabs bid and Keflex qid x 1 wk.    Orders Placed This Encounter  Procedures  . CBC with Differential/Platelet  . POCT glycosylated hemoglobin (Hb A1C)  . HM DIABETES FOOT EXAM    Meds ordered this encounter  Medications  . cephALEXin (KEFLEX) 500 MG capsule    Sig: Take 1 capsule (500 mg total) by mouth 4 (four) times daily.    Dispense:  28 capsule    Refill:  0  . lisinopril-hydrochlorothiazide (PRINZIDE,ZESTORETIC) 20-25 MG tablet    Sig: Take 1 tablet by mouth daily.    Dispense:  90 tablet    Refill:  1  . omeprazole (PRILOSEC) 40 MG capsule    Sig: TAKE 1 CAPSULE(40 MG) BY MOUTH DAILY    Dispense:  90 capsule    Refill:  3  . Insulin Pen Needle (PEN NEEDLES) 32G X 4 MM MISC    Sig: 1 Units by Does not apply route daily.    Dispense:  90 each    Refill:  4  . sulfamethoxazole-trimethoprim (BACTRIM DS,SEPTRA DS) 800-160 MG tablet    Sig: Take 2 tablets by mouth 2 (two) times daily.    Dispense:  28 tablet    Refill:  0      EDelman Cheadle M.D.  [facility]GO[address] [place] [phone]  11/05/16 9:01 AM

## 2016-11-04 ENCOUNTER — Ambulatory Visit (INDEPENDENT_AMBULATORY_CARE_PROVIDER_SITE_OTHER): Payer: BLUE CROSS/BLUE SHIELD | Admitting: Family Medicine

## 2016-11-04 ENCOUNTER — Encounter: Payer: Self-pay | Admitting: Family Medicine

## 2016-11-04 VITALS — BP 165/98 | HR 105 | Temp 98.4°F | Resp 18 | Ht 75.0 in | Wt 384.2 lb

## 2016-11-04 DIAGNOSIS — Z87898 Personal history of other specified conditions: Secondary | ICD-10-CM | POA: Diagnosis not present

## 2016-11-04 DIAGNOSIS — Z794 Long term (current) use of insulin: Secondary | ICD-10-CM | POA: Diagnosis not present

## 2016-11-04 DIAGNOSIS — E11621 Type 2 diabetes mellitus with foot ulcer: Secondary | ICD-10-CM

## 2016-11-04 DIAGNOSIS — E1129 Type 2 diabetes mellitus with other diabetic kidney complication: Secondary | ICD-10-CM | POA: Diagnosis not present

## 2016-11-04 DIAGNOSIS — E1140 Type 2 diabetes mellitus with diabetic neuropathy, unspecified: Secondary | ICD-10-CM

## 2016-11-04 DIAGNOSIS — E1165 Type 2 diabetes mellitus with hyperglycemia: Secondary | ICD-10-CM | POA: Diagnosis not present

## 2016-11-04 DIAGNOSIS — L97522 Non-pressure chronic ulcer of other part of left foot with fat layer exposed: Secondary | ICD-10-CM

## 2016-11-04 DIAGNOSIS — R809 Proteinuria, unspecified: Secondary | ICD-10-CM

## 2016-11-04 DIAGNOSIS — F1011 Alcohol abuse, in remission: Secondary | ICD-10-CM

## 2016-11-04 LAB — POCT GLYCOSYLATED HEMOGLOBIN (HGB A1C): Hemoglobin A1C: 7.6

## 2016-11-04 MED ORDER — OMEPRAZOLE 40 MG PO CPDR: DELAYED_RELEASE_CAPSULE | ORAL | 3 refills | Status: AC

## 2016-11-04 MED ORDER — SULFAMETHOXAZOLE-TRIMETHOPRIM 800-160 MG PO TABS: 2.0000 | ORAL_TABLET | Freq: Two times a day (BID) | ORAL | 0 refills | Status: DC

## 2016-11-04 MED ORDER — CEPHALEXIN 500 MG PO CAPS: 500.0000 mg | ORAL_CAPSULE | Freq: Four times a day (QID) | ORAL | 0 refills | Status: DC

## 2016-11-04 MED ORDER — LISINOPRIL-HYDROCHLOROTHIAZIDE 20-25 MG PO TABS: 1.0000 | ORAL_TABLET | Freq: Every day | ORAL | 1 refills | Status: DC

## 2016-11-04 MED ORDER — PEN NEEDLES 32G X 4 MM MISC: 1.0000 [IU] | Freq: Every day | 4 refills | Status: DC

## 2016-11-04 NOTE — Patient Instructions (Signed)
     IF you received an x-ray today, you will receive an invoice from [facility]. Please contact [facility] at [phone] with questions or concerns regarding your invoice.   IF you received labwork today, you will receive an invoice from [facility]. Please contact [facility] at [phone] with questions or concerns regarding your invoice.   Our billing staff will not be able to assist you with questions regarding bills from these companies.  You will be contacted with the lab results as soon as they are available. The fastest way to get your results is to activate your My Chart account. Instructions are located on the last page of this paperwork. If you have not heard from us regarding the results in 2 weeks, please contact this office.     

## 2016-11-05 ENCOUNTER — Ambulatory Visit (INDEPENDENT_AMBULATORY_CARE_PROVIDER_SITE_OTHER): Payer: BLUE CROSS/BLUE SHIELD

## 2016-11-05 ENCOUNTER — Ambulatory Visit (INDEPENDENT_AMBULATORY_CARE_PROVIDER_SITE_OTHER): Payer: BLUE CROSS/BLUE SHIELD | Admitting: Family Medicine

## 2016-11-05 DIAGNOSIS — M25572 Pain in left ankle and joints of left foot: Secondary | ICD-10-CM | POA: Diagnosis not present

## 2016-11-05 MED ORDER — SITAGLIPTIN PHOS-METFORMIN HCL 50-1000 MG PO TABS: 1.0000 | ORAL_TABLET | Freq: Two times a day (BID) | ORAL | 3 refills | Status: DC

## 2016-11-06 LAB — CBC WITH DIFFERENTIAL/PLATELET
Basophils Absolute: 0 10*3/uL (ref 0.0–0.2)
Basos: 1 %
EOS (ABSOLUTE): 0.2 10*3/uL (ref 0.0–0.4)
EOS: 4 %
HEMATOCRIT: 38.3 % (ref 37.5–51.0)
Hemoglobin: 12.6 g/dL — ABNORMAL LOW (ref 13.0–17.7)
IMMATURE GRANULOCYTES: 0 %
Immature Grans (Abs): 0 10*3/uL (ref 0.0–0.1)
LYMPHS ABS: 2.1 10*3/uL (ref 0.7–3.1)
Lymphs: 45 %
MCH: 33 pg (ref 26.6–33.0)
MCHC: 32.9 g/dL (ref 31.5–35.7)
MCV: 100 fL — AB (ref 79–97)
MONOS ABS: 0.3 10*3/uL (ref 0.1–0.9)
Monocytes: 7 %
NEUTROS PCT: 43 %
Neutrophils Absolute: 2 10*3/uL (ref 1.4–7.0)
Platelets: 311 10*3/uL (ref 150–379)
RBC: 3.82 x10E6/uL — ABNORMAL LOW (ref 4.14–5.80)
RDW: 13.9 % (ref 12.3–15.4)
WBC: 4.6 10*3/uL (ref 3.4–10.8)

## 2016-11-06 NOTE — Telephone Encounter (Signed)
Request for the one touch ultra blue testing strips were denied through cover my meds.  Please advise next step.

## 2016-11-07 MED ORDER — BLOOD GLUCOSE MONITOR KIT: PACK | 0 refills | Status: AC

## 2016-11-07 NOTE — Telephone Encounter (Signed)
Faxed to [facility].

## 2016-11-07 NOTE — Telephone Encounter (Signed)
I guess he's going to have to get a new meter. Please check with pt to make sure this is the meter pt is using and then send a rx for aq new meter, strips to pharm so he can get what ever is on his formulary

## 2016-11-07 NOTE — Telephone Encounter (Signed)
Meds ordered this encounter  Medications  . blood glucose meter kit and supplies KIT    Sig: Dispense based on patient and insurance preference. Use up to four times daily as directed. (FOR ICD-9 250.00, 250.01).    Dispense:  1 each    Refill:  0    Order Specific Question:   Supervising Provider    Answer:   Brigitte Pulse, EVA N [[id number]]    Order Specific Question:   Number of strips    Answer:   100    Order Specific Question:   Number of lancets    Answer:   100

## 2016-11-12 NOTE — Progress Notes (Signed)
Chad [name]  [phone] [phone]  Clinic Follow up Note   Patient Care Team: Shawnee Knapp, MD as PCP - General (Family Medicine) Michael Boston, MD as Consulting Physician (General Surgery) Fay Records, MD as Consulting Physician (Cardiology) Gatha Mayer, MD as Consulting Physician (Gastroenterology) Alexis Frock, MD as Consulting Physician (Urology) Tania Ade, RN as Registered Nurse Truitt Merle, MD as Consulting Physician (Medical Oncology)   CHIEF COMPLAINTS: Follow up colon cancer   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   Staging form: Colon and Rectum, AJCC 7th Edition     Pathologic stage from 10/18/2015: Stage IIIA (T2, N1a, cM0) - Signed by Truitt Merle, MD on 11/15/2015       Cancer of left colon ([place])   05/11/2015 Imaging    CT ABD/PELVIS: Gallstones w/sludge;liver prominent without focal liver lesion, mild thickening of bladder wall; negative for cancer      05/18/2015 Imaging    [place] ABD: Negative      07/25/2015 Initial Diagnosis    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 8 mm descending polyp and 5 mm rectosigmoid polyp;18-29 mm proximal rectal polyp      07/25/2015 Tumor Marker    CEA=0.7      07/26/2015 Pathology Results    Sigmoid mass: invasive colorectal adenocarcinoma      10/22/2015 Pathologic Stage    T2, N1, MO    #29 nodes examined w/ 1 postive node; moderately differentiated; Negative for Lymph-vascular and Peri-neural invasion; Microscopic extension into muscularis propria      10/26/2015 Pathology Results    MSI Stable      11/30/2015 - 02/08/2016 Chemotherapy    Adjuvant chemotherapy with Capecitabine 2500 mg twice daily, on day  1-14, oxaliplatin 130 mg/m, every 21 days      01/19/2016 Genetic Testing    Patient has genetic testing done for young colon cancer. APC [id number] VUS identified on the Colorectal cancer panel.  The Colorectal Cancer Panel offered by [facility] includes sequencing and/or duplication/deletion testing of the following 19 genes: APC, ATM, AXIN2, BMPR1A, CDH1, CHEK2, EPCAM, MLH1, MSH2, MSH6, MUTYH, PMS2, POLD1, POLE, PTEN, SCG5/GREM1, SMAD4, STK11, and TP53.       05/01/2016 Imaging    Surveillance CT CAP scan showed new more focal low-density lesions in the liver, possible metastasis. No other new lesions.      05/01/2016 Progression    Surveillance CT scan showed probable liver metastasis, confirmed by liver mass biopsy       06/05/2016 Imaging    MR ABDOMEN W WO CONTRAST IMPRESSION: 1. 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 2. No additional metastatic disease in the upper abdomen. 3. Cholelithiasis.      06/24/2016 Pathology Results    US Biopsy Diagnosis Liver, needle/core biopsy, right lobe - METASTATIC ADENOCARCINOMA, CONSISTENT WITH COLORECTAL PRIMARY.      07/16/2016 Miscellaneous    Foundation One results received      07/18/2016 - 09/03/2016 Chemotherapy    Chemo CAPOX, every 3 weeks, started on 07/18/2016 and added Avastin, every 3 weeks, on 08/08/16. Oxaplatin held since 09/03/16 due to severe infusion reaction. Patient was to hold Xeloda from 08/09/16 and restart on 09/04/16.  10/02/2016 -  Chemotherapy    Chemotherapy FOLFIRI and Avastin starting 10/02/16, every 2 weeks      10/15/2016 Imaging    CT CAP w/ contrast 10/15/16 IMPRESSION: 1. Unfortunately there is worsening of the hepatic metastatic burden with generally enlarging and scattered new hepatic metastatic lesions. 2. New right hydronephrosis, hydroureter, and delayed excretion on the right indicating high-grade obstruction of the ureter. This obstruction appears to be near the iliac  vessel cross over where there is abnormal mesenteric stranding and nodularity likely reflecting some residual tumor causing traction on or possibly even invading the right ureter. This may warrant ureteral stenting or nephrostomy. 3. Other imaging findings of potential clinical significance: Stable calcified pleural nodularity on the right, no change from 2014. Coronary atherosclerosis. Cholelithiasis. Stable mild extrahepatic biliary dilatation. Extensive bridging spurring in the thoracolumbar spine and sacroiliac joints.      10/15/2016 Progression    Restaging CT scan showed worsening liver metastasis, high-grade right ureter obstruction and hydronephrosis, concerning for peritoneal metastasis.       HISTORY OF PRESENTING ILLNESS: 11/16/15 Chad [name] 46 y.o. male is here because of recently diagnosed colon cancer.  He is accompanied by his friend to the clinic today.  He presented with abdominal pain since Oct 2016,  Was seen in the emergency room on 05/11/2015,  CT abdomen and pelvis showed  Mild thickening of the urinary bladder wall, gallstones, otherwise negative. He was seen by her primary care physician Dr. Brigitte Pulse afterwards, who referred him to GI Dr. Carlean Purl.  His father passed away from [place] cancer around that time,  So his appointment was postponed, and he finally had colonoscopy in early January 2017, which showed multiple polyps, and  A ulcerated mass in distal sigmoid colon.  He was subsequently referred to colorectal surgeon Dr. Johney Maine, and  Eventually had left colectomy  On 11/01/2015.  He tolerated surgery well, and recovered well. He has good appetite good, BM is normal most of time, occasional diarrhea, likely second to food, mild low abdomen pain, which has improves, he lost about 30 lbs before surgery and 10lbs after surgery.   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  He is divorced, he has a 80 year old daughter who  lives with her mother in [place]. He has sisters and brothers, and her mother who live close by.   CURRENT THERAPY:  Chemotherapy FOLFIRI and Avastin every 2 weeks, started 10/02/16,   INTERIM HISTORY:  Chad [name] returns for follow-up and chemo cycle 4. Today he presents to the clinic says he can get to his neurologist as soon as the 8th of May. He says he has normal urination and fluid intake. He reports that after treatment his experiences nausea and stomach cramps last into the next morning after infusion and nauseousness lasted 2-3 days after treatment. He requested no he reports his glucose was at 230 this morning before coming in. He reports his PCP restarted Metformin to combat his treatment side effects. He also is on Lantus injections and increased doseage. He is no longer on Glucozide. He reports his energy level and fatigue is taking a toll. He still goes to work and is active with helping his family as well as hisself. He checked his BP at home and it is around 140/70 approximately. He has been more stressed lately which contributes to it on top of treatment.     MEDICAL HISTORY:  Past Medical History:  Diagnosis Date  . Cancer [facility])    colon cancer  . Coronary artery disease   . Diabetes mellitus without complication ([place]) 07/6107   oral agents only (07/2015)  . ETOH abuse 2014  . Fatty liver 12/2014   on ultrasound. pt obese and abuses ETOH  . Gallstone 12/2014   GB stones and sludge on ultrasound and CT,  . GERD (gastroesophageal reflux disease)   . Hypertension   . Morbid obesity with BMI of 50.0-59.9, adult ([place]) 03/2013  . Rectal bleeding 05/2015   07/2015 colonoscopy with malignant appearing sigmoid mass. Additional rectal, descending, polyps  . Shoulder pain, bilateral    due to previous injury    SURGICAL HISTORY: Past Surgical History:  Procedure Laterality Date  . CARDIAC CATHETERIZATION N/A 04/06/2015   Procedure: Left Heart Cath and Coronary Angiography;   Surgeon: Belva Crome, MD;  Location: [facility];  Service: Cardiovascular;  Laterality: N/A;  . COLONOSCOPY N/A 07/25/2015   Procedure: COLONOSCOPY;  Surgeon: Gatha Mayer, MD;  Location: [facility] ENDOSCOPY;  Service: Gastroenterology;  Laterality: N/A;  . CYSTOSCOPY  10/18/2015   Procedure: CYSTOSCOPY FLEXIBLE URETHRAL DIALATION AND FOLEY PLACEMENT;  Surgeon: Alexis Frock, MD;  Location: [facility] ORS;  Service: Urology;;  . ESOPHAGOGASTRODUODENOSCOPY N/A 07/25/2015   Procedure: ESOPHAGOGASTRODUODENOSCOPY (EGD);  Surgeon: Gatha Mayer, MD;  Location: [facility];  Service: Gastroenterology;  Laterality: N/A;  . PORTACATH PLACEMENT Right 08/01/2016   Procedure: INSERTION PORT-A-CATH WITH ULTRASOUND AND FLUOROSOPIC GUIDANCE - RIGHT INTERNAL JUGULAR;  Surgeon: Michael Boston, MD;  Location: [facility];  Service: General;  Laterality: Right;    SOCIAL HISTORY: Social History   Social History  . Marital status: Legally Separated    Spouse name: N/A  . Number of children: 1  . Years of education: N/A   Occupational History  . Manager    Social History Main Topics  . Smoking status: Never Smoker  . Smokeless tobacco: Current User    Types: Chew     Comment: occasionally dips tobacco - has used for approx 20 yrs (12/21/15)  . Alcohol use Yes     Comment: heavy drinking for 10 years, 2-3  drinks of liqour a night, has been cutting back.    . Drug use: No  . Sexual activity: Not on file   Other Topics Concern  . Not on file   Social History Narrative   Divorced, has 69 yo daughter lives in [place]   Lives alone   Manages chain of car wash dealers       FAMILY HISTORY: Family History  Problem Relation Age of Onset  . Diabetes Father   . Pancreatic cancer Father 64    former smoker, but had not smoked in 64 years  . Cancer Maternal Uncle     d. 40s; unknown type of cancer -metastatic throughout abdomen   . Other Brother     oldest brother has had a normal colonoscopy  . Heart attack  Maternal Grandfather     d. 57-58; heavy smoker and drinker  . Heart attack Paternal Grandmother     d. early 60s  . Heart attack Paternal Grandfather 51  . Heart failure Maternal Uncle   . Skin cancer Maternal Uncle 42    NOS type; was out in the sun a lot  . Brain cancer Maternal Uncle     NOS type; mother's maternal half-brother dx. 20-68  . Cirrhosis Paternal Uncle   . Cancer Paternal Uncle     (  x2-3) uncles with NOS cancers; d. 20s    ALLERGIES:  has No Known Allergies.  MEDICATIONS:  Current Outpatient Prescriptions  Medication Sig Dispense Refill  . acetaminophen (TYLENOL) 500 MG tablet Take 1,000 mg by mouth every 6 (six) hours as needed for moderate pain or headache.    . Ascorbic Acid (VITAMIN C) 1000 MG tablet Take 1,000 mg by mouth daily.    Marland Kitchen aspirin EC 81 MG tablet Take 1 tablet (81 mg total) by mouth daily. DO NOT TAKE AGAIN UNTIL 08/09/15    . blood glucose meter kit and supplies KIT Dispense based on patient and insurance preference. Use up to four times daily as directed. (FOR ICD-9 250.00, 250.01). 1 each 0  . carvedilol (COREG) 12.5 MG tablet Take 1 tablet (12.5 mg total) by mouth 2 (two) times daily with a meal. 180 tablet 1  . cephALEXin (KEFLEX) 500 MG capsule Take 1 capsule (500 mg total) by mouth 4 (four) times daily. 28 capsule 0  . gabapentin (NEURONTIN) 300 MG capsule Take 1 capsule (300 mg total) by mouth at bedtime. 90 capsule 1  . glucose blood test strip One touch strips test bid Dx. dm 200 each 3  . Insulin Glargine (BASAGLAR KWIKPEN) 100 UNIT/ML SOPN Inject up to 0.4 mL (40u) into skin nightly, titrate as advised by physician 15 mL 5  . Insulin Pen Needle (PEN NEEDLES) 32G X 4 MM MISC 1 Units by Does not apply route daily. 90 each 4  . lidocaine-prilocaine (EMLA) cream Apply 1 application topically as needed. 30 g 2  . lisinopril-hydrochlorothiazide (PRINZIDE,ZESTORETIC) 20-25 MG tablet Take 1 tablet by mouth daily. 90 tablet 1  . omeprazole  (PRILOSEC) 40 MG capsule TAKE 1 CAPSULE(40 MG) BY MOUTH DAILY 90 capsule 3  . ondansetron (ZOFRAN) 8 MG tablet Take 1 tablet (8 mg total) by mouth every 8 (eight) hours as needed for nausea or vomiting. 30 tablet 0  . prochlorperazine (COMPAZINE) 10 MG tablet Take 1 tablet (10 mg total) by mouth every 6 (six) hours as needed for nausea or vomiting. 30 tablet 2  . sitaGLIPtin-metformin (JANUMET) 50-1000 MG tablet Take 1 tablet by mouth 2 (two) times daily with a meal. 60 tablet 3  . sulfamethoxazole-trimethoprim (BACTRIM DS,SEPTRA DS) 800-160 MG tablet Take 2 tablets by mouth 2 (two) times daily. 28 tablet 0  . traZODone (DESYREL) 50 MG tablet Take 1/2 tab po qam and 1.5 tabs po qhs 180 tablet 1   No current facility-administered medications for this visit.    Facility-Administered Medications Ordered in Other Visits  Medication Dose Route Frequency Provider Last Rate Last Dose  . sodium chloride flush (NS) 0.9 % injection 10 mL  10 mL Intracatheter PRN Truitt Merle, MD   10 mL at 10/18/16 1432    REVIEW OF SYSTEMS:   Constitutional: Denies fevers, chills or abnormal night sweats (+) fatigue (+) loss of energy  Eyes: Denies blurriness of vision, double vision or watery eyes Ears, nose, mouth, throat, and face: Denies mucositis or sore throat Respiratory: Denies cough, dyspnea or wheezes. Cardiovascular: Denies palpitation, chest discomfort or lower extremity swelling Gastrointestinal:  Denies heartburn. (+) Constipation. (+) Nausea (+) stomach cramps Skin: Denies abnormal skin rashes Lymphatics: Denies new lymphadenopathy or easy bruising Neurological:Denies numbness, or new weaknesses Behavioral/Psych: Mood is stable, no new changes  All other systems were reviewed with the patient and are negative.  PHYSICAL EXAMINATION: ECOG PERFORMANCE STATUS: 1 Vitals:   11/13/16 1201  BP: (!) 181/91  Pulse: 84  Resp: 17  Temp: 99.1 F (37.3 C)  SpO2: 99%  Weight: (!) 392 lb 1.6 oz (177.9 kg)    Height: '6\' 3"'$  (1.905 m)     GENERAL:alert,  Morbidly obese. SKIN: skin color, texture, turgor are normal, no rashes or significant lesions EYES: normal, conjunctiva are pink and non-injected, sclera clear OROPHARYNX:no exudate, no erythema and lips, buccal mucosa, and tongue normal  NECK: supple, thyroid normal size, non-tender, without nodularity LYMPH:  no palpable lymphadenopathy in the cervical, axillary or inguinal LUNGS: clear to auscultation and percussion with normal breathing effort HEART: regular rate & rhythm and no murmurs and no lower extremity edema ABDOMEN: abdomen soft,  Surgical incision and laparoscopic scars are well-healed. non-tender and normal bowel sounds  Musculoskeletal: no cyanosis of digits and no clubbing  PSYCH: alert & oriented x 3 with fluent speech NEURO: no focal motor/sensory deficits  LABORATORY DATA:  I have reviewed the data as listed CBC Latest Ref Rng & Units 11/13/2016 11/05/2016 10/30/2016  WBC 4.0 - 10.3 10e3/uL 5.7 4.6 6.9  Hemoglobin 13.0 - 17.1 g/dL 13.0 - 12.9(L)  Hematocrit 38.4 - 49.9 % 38.7 38.3 39.1  Platelets 140 - 400 10e3/uL 240 311 304    CMP Latest Ref Rng & Units 11/13/2016 10/30/2016 10/16/2016  Glucose 70 - 140 mg/dl 271(H) 199(H) 204(H)  BUN 7.0 - 26.0 mg/dL 10.6 15.1 10.6  Creatinine 0.7 - 1.3 mg/dL 1.1 1.1 1.1  Sodium 136 - 145 mEq/L 140 140 139  Potassium 3.5 - 5.1 mEq/L 4.5 4.3 3.8  Chloride 96 - 106 mmol/L - - -  CO2 22 - 29 mEq/L '24 24 25  '$ Calcium 8.4 - 10.4 mg/dL 9.5 9.5 9.5  Total Protein 6.4 - 8.3 g/dL 7.3 7.7 7.6  Total Bilirubin 0.20 - 1.20 mg/dL 0.37 0.56 0.52  Alkaline Phos 40 - 150 U/L 73 69 79  AST 5 - 34 U/L '22 18 18  '$ ALT 0 - 55 U/L '25 19 15   '$ CEA:  07/25/2015: 0.7 11/30/2015: 4.5 05/01/2016: 57.89 (64.7)  07/18/2016: 329.87 (310.0) 09/03/2016: 589 10/02/2016: 676.76 10/30/2016: 496.12   PATHOLOGY REPORT: US Biopsy 06/24/2016 Diagnosis Liver, needle/core biopsy, right lobe - METASTATIC ADENOCARCINOMA,  CONSISTENT WITH COLORECTAL PRIMARY. - SEE COMMENT.  Diagnosis 10/18/2015 1. Colon, segmental resection for tumor, rectosigmoid INFILTRATIVE MODERATELY DIFFERENTIATED ADENOCARCINOMA OF THE COLON (3.8 CM) THE TUMOR INVADES MUSCULARIS PROPRIA MARGINS OF RESECTION ARE NEGATIVE FOR TUMOR METASTATIC COLONIC ADENOCARCINOMA IN ONE OF TWENTY-NINE LYMPH NODES (1/29) 2. Colon, resection margin (donut), final distal margin BENIGN COLONIC TISSUE, NEGATIVE FOR CARCINOMA      Diagnosis 07/25/2015 1. Colon, polyp(s), descending - TUBULAR ADENOMA. NO HIGH GRADE DYSPLASIA OR MALIGNANCY IDENTIFIED. 2. Colon, biopsy, distal sigmoid mass - INVASIVE COLORECTAL ADENOCARCINOMA. 3. Colon, polyp(s), small recto sigmoid - HYPERPLASTIC POLYP. NO ADENOMATOUS CHANGE OR MALIGNANCY. 4. Rectum, polyp(s) - TUBULOVILLOUS ADENOMA. NO HIGH GRADE DYSPLASIA OR MALIGNANCY IDENTIFIED.   RADIOGRAPHIC STUDIES: I have personally reviewed the radiological images as listed and agreed with the findings in the report.  CT CAP w/ contrast 10/15/16 IMPRESSION: 1. Unfortunately there is worsening of the hepatic metastatic burden with generally enlarging and scattered new hepatic metastatic lesions. 2. New right hydronephrosis, hydroureter, and delayed excretion on the right indicating high-grade obstruction of the ureter. This obstruction appears to be near the iliac vessel cross over where there is abnormal mesenteric stranding and nodularity likely reflecting some residual tumor causing traction on or possibly even invading the right ureter. This may  warrant ureteral stenting or nephrostomy. 3. Other imaging findings of potential clinical significance: Stable calcified pleural nodularity on the right, no change from 2014. Coronary atherosclerosis. Cholelithiasis. Stable mild extrahepatic biliary dilatation. Extensive bridging spurring in the thoracolumbar spine and sacroiliac joints.  Abdominal MRI w wo contrast  06/05/2016 IMPRESSION: 1. 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 2. No additional metastatic disease in the upper abdomen. 3. Cholelithiasis.  CT chest, abdomen and pelvis w contrast 05/01/2016 IMPRESSION: 1. Stable chest CT.  No evidence of thoracic metastatic disease. 2. Progressive heterogeneity of the liver could be due to heterogeneous steatosis. However, there are new more focal low-density lesions which are worrisome for possible metastatic disease given the patient's history. Further evaluation with hepatic MRI recommended. 3. Resolving postsurgical retroperitoneal fluid collections status [name] partial colon resection and anastomosis.  COLONOSCOPY 07/25/2015 ENDOSCOPIC IMPRESSION: 1) 1/4 circumference firm ulcerated mass in distal sigmmoid - approx 22 cm from verge. Looks like cancer. Biopsied. 2) 8 mm descending polyp and 5 mm rectosigmoid polyp removed cold snare, completely recovered and sent to path. 3) 18-20 mm pedunculated proximal rectal polyp removed hot snare and completely recovered for pathology. 4) Otherwise normal colonoscopy RECOMMENDATIONS: 1. Hold Aspirin and all other NSAIDS for 2 weeks. 2. Will call pathology results and plans though anticipate surgical referral after pathology in. CEA will be drawn at hospital today. He has had recent CT's of chest, and abd/pelvis - will see if he needs new ones - he may.  EGD 07/25/2015 ENDOSCOPIC IMPRESSION: Normal appearing esophagus and GE junction, the stomach was well visualized and normal in appearance, normal appearing duodenum  ASSESSMENT & PLAN:  46 y.o.  Caucasian male, with past medical history of heavy alcohol drinking, hypertension, diabetes, coronary artery disease, presented with abdominal pain.  1.  Cancer of left colon, Distal sigmoid, invasive adenocarcinoma, G2, pT2N1aM0, stage IIIA, MSI-stable, liver metastasis in 04/2016, possible peritoneal mets in 10/2016 -I initially  reviewed his CT scan findings, and the surgical pathology findings include details with patient. -he has locally advanced sigmoid colon cancer, with one out of 29 lymph nodes positive. -He has completed 3 months of adjuvant chemotherapy CAPOX, tolerated well. -He unfortunately developed metastatic disease right after he completes adjuvant chemotherapy. MRI confirmed at least a 6 metastatic lesions in the liver, largest 4.2 cm. Giving the multiple liver metastasis, this is likely incurable metastatic cancer. -He underwent ultrasound-guided liver mass biopsy to confirm metastasis, which revealed metastatic adenocarcinoma consistent with a colorectal primary.  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 He is not a candidate for immunotherapy, and would not benefit from EGFR inhibitor. -He was on first-line chemotherapy CAPOX and avastin, unfortunately he developed severe infusion reaction to oxaliplatin on 08/08/16, and oxliplatin has been held since then.  -I previously recommended changing his chemotherapy to FOLFIRI and Avastin started on 10/02/16. --His restaging CT scan was delayed a few times, finally was done on 10/15/16, which showed worsening hepatic metastatic burden and new right hydronephrosis secondary to ureter obstruction, possible related to mesenteric metastasis which is new. I have previously changed his chemotherapy regiment a few weeks before the scan. -Lab previously reviewed,  adequate for treatment, we will proceed with 4th cycle FOLFIRI and Avastin today, and continue every 2 weeks -he is clinically doing well overall. -Plan to repeat restaging scans after cycle 6   2. Right ureter obstruction and hydronephrosis -This was found on the recent CT scan. His creatinine is 1.1 today -This is likely  secondary to his new peritoneal metastasis -Urgent referral was previously made to urology, to be seen within a week. He was seen by Dr. Tresa Moore before, he had a ureter stent  placed during his colon surgery and was subsequently removed.  -He previously canceled his appointment last week due to the conflict with his work schedule, he has rescheduled with Dr. Tresa Moore in a few weeks    3. Genetics -per NCCN guideline, due to his young age and family history of pancreatic cancer, he is qualified for genetic testing to ruled out inheritable genetic syndromes. -His genetic testing was negative. APC VUS called [id number] found on the colorectal cancer panel.  The Colorectal Cancer Panel offered by [facility] includes sequencing and/or duplication/deletion testing of the following 19 genes: APC, ATM, AXIN2, BMPR1A, CDH1, CHEK2, EPCAM, MLH1, MSH2, MSH6, MUTYH, PMS2, POLD1, POLE, PTEN, SCG5/GREM1, SMAD4, STK11, and TP53.  4.  HTN, DM, CAD, morbid obesity -he will continue follow-up with his primary care physician -We will monitor his blood pressure, glucose closely during the chemotherapy, and may need to adjust his the medication if needed. -I strongly encouraged him to see Dr. Brigitte Pulse to adjust his diabetic medication due to his uncontrolled hyperglycemia and hypertension --his BG again is high, 204 today, he will refill his glipizide, and monitor his BG at home. -We previously discussed his premedication dexamethasone will increase his blood sugar, diabetic diet discussed again. -PCP restarted his metformin and stopped Glucozide. He still takes his Lantus injections.  -BP was high 181/91 (11/13/16) and will recheck in infusion room and if still high will give medication to lower it.   5. History of alcohol abuse -He has previously stopped alcohol completely since he started chemotherapy.   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  -Near resolved now  7. Coping and social issues  -The patient is having a hard time with his diagnosis.  -I previously offered social work to help whenever he is in need of it. He declined at that time.  8. Depression  -he  has been depressed previously due to his cancer progression  -He previously states he has good social support from his progress in the muscle, he denies suicidal idea. He declined antidepressant medication -I previously encouraged him to Dr. our Education officer, museum for counseling, he will think about it.  9. Goal of care discussion  -We again discussed the incurable nature of his cancer, and the overall poor prognosis, especially if he does not have good response to chemotherapy or progress on chemo -The patient understands the goal of care is palliative. -I recommend DNR/DNI, he will think about it    Plan -Labs reviewed, will proceed 4th cycle FOLFIRI and Avastin today and continue very 2 weeks -Repeat CT scans W contrast 5/29 -f/u in 4 weeks   All questions were answered. The patient knows to call the clinic with any problems, questions or concerns.  I spent 20 minutes counseling the patient face to face. The total time spent in the appointment was 25 minutes and more than 50% was on counseling.   Truitt Merle, MD 11/13/2016   This document serves as a record of services personally performed by Truitt Merle, MD. It was created on her behalf by Joslyn Devon, a trained medical scribe. The creation of this record is based on the scribe's personal observations and the provider's statements to them. This document has been checked and approved by the attending provider.

## 2016-11-13 ENCOUNTER — Ambulatory Visit: Payer: BLUE CROSS/BLUE SHIELD

## 2016-11-13 ENCOUNTER — Other Ambulatory Visit: Payer: Self-pay

## 2016-11-13 ENCOUNTER — Ambulatory Visit (HOSPITAL_BASED_OUTPATIENT_CLINIC_OR_DEPARTMENT_OTHER): Payer: BLUE CROSS/BLUE SHIELD

## 2016-11-13 ENCOUNTER — Other Ambulatory Visit (HOSPITAL_BASED_OUTPATIENT_CLINIC_OR_DEPARTMENT_OTHER): Payer: BLUE CROSS/BLUE SHIELD

## 2016-11-13 ENCOUNTER — Ambulatory Visit (HOSPITAL_BASED_OUTPATIENT_CLINIC_OR_DEPARTMENT_OTHER): Payer: BLUE CROSS/BLUE SHIELD | Admitting: Hematology

## 2016-11-13 VITALS — BP 143/69 | HR 83 | Resp 18

## 2016-11-13 VITALS — BP 181/91 | HR 84 | Temp 99.1°F | Resp 17 | Ht 75.0 in | Wt 392.1 lb

## 2016-11-13 DIAGNOSIS — C787 Secondary malignant neoplasm of liver and intrahepatic bile duct: Secondary | ICD-10-CM | POA: Diagnosis not present

## 2016-11-13 DIAGNOSIS — N1330 Unspecified hydronephrosis: Secondary | ICD-10-CM

## 2016-11-13 DIAGNOSIS — I2510 Atherosclerotic heart disease of native coronary artery without angina pectoris: Secondary | ICD-10-CM

## 2016-11-13 DIAGNOSIS — N135 Crossing vessel and stricture of ureter without hydronephrosis: Secondary | ICD-10-CM | POA: Diagnosis not present

## 2016-11-13 DIAGNOSIS — C786 Secondary malignant neoplasm of retroperitoneum and peritoneum: Secondary | ICD-10-CM | POA: Diagnosis not present

## 2016-11-13 DIAGNOSIS — Z95828 Presence of other vascular implants and grafts: Secondary | ICD-10-CM

## 2016-11-13 DIAGNOSIS — E1165 Type 2 diabetes mellitus with hyperglycemia: Secondary | ICD-10-CM

## 2016-11-13 DIAGNOSIS — C186 Malignant neoplasm of descending colon: Secondary | ICD-10-CM

## 2016-11-13 DIAGNOSIS — F329 Major depressive disorder, single episode, unspecified: Secondary | ICD-10-CM

## 2016-11-13 DIAGNOSIS — Z5111 Encounter for antineoplastic chemotherapy: Secondary | ICD-10-CM | POA: Diagnosis not present

## 2016-11-13 DIAGNOSIS — E119 Type 2 diabetes mellitus without complications: Secondary | ICD-10-CM

## 2016-11-13 DIAGNOSIS — Z5112 Encounter for antineoplastic immunotherapy: Secondary | ICD-10-CM | POA: Diagnosis not present

## 2016-11-13 DIAGNOSIS — G620 Drug-induced polyneuropathy: Secondary | ICD-10-CM

## 2016-11-13 DIAGNOSIS — I10 Essential (primary) hypertension: Secondary | ICD-10-CM | POA: Diagnosis not present

## 2016-11-13 DIAGNOSIS — C19 Malignant neoplasm of rectosigmoid junction: Secondary | ICD-10-CM

## 2016-11-13 LAB — CBC WITH DIFFERENTIAL/PLATELET
BASO%: 0.9 % (ref 0.0–2.0)
Basophils Absolute: 0.1 10*3/uL (ref 0.0–0.1)
EOS%: 4.2 % (ref 0.0–7.0)
Eosinophils Absolute: 0.2 10*3/uL (ref 0.0–0.5)
HCT: 38.7 % (ref 38.4–49.9)
HEMOGLOBIN: 13 g/dL (ref 13.0–17.1)
LYMPH#: 2 10*3/uL (ref 0.9–3.3)
LYMPH%: 34.9 % (ref 14.0–49.0)
MCH: 33.4 pg (ref 27.2–33.4)
MCHC: 33.6 g/dL (ref 32.0–36.0)
MCV: 99.4 fL — ABNORMAL HIGH (ref 79.3–98.0)
MONO#: 0.5 10*3/uL (ref 0.1–0.9)
MONO%: 9.6 % (ref 0.0–14.0)
NEUT#: 2.9 10*3/uL (ref 1.5–6.5)
NEUT%: 50.4 % (ref 39.0–75.0)
PLATELETS: 240 10*3/uL (ref 140–400)
RBC: 3.89 10*6/uL — AB (ref 4.20–5.82)
RDW: 14.5 % (ref 11.0–14.6)
WBC: 5.7 10*3/uL (ref 4.0–10.3)

## 2016-11-13 LAB — COMPREHENSIVE METABOLIC PANEL
ALT: 25 U/L (ref 0–55)
AST: 22 U/L (ref 5–34)
Albumin: 3.5 g/dL (ref 3.5–5.0)
Alkaline Phosphatase: 73 U/L (ref 40–150)
Anion Gap: 10 mEq/L (ref 3–11)
BUN: 10.6 mg/dL (ref 7.0–26.0)
CHLORIDE: 106 meq/L (ref 98–109)
CO2: 24 meq/L (ref 22–29)
Calcium: 9.5 mg/dL (ref 8.4–10.4)
Creatinine: 1.1 mg/dL (ref 0.7–1.3)
EGFR: 83 mL/min/{1.73_m2} — ABNORMAL LOW (ref >90)
GLUCOSE: 271 mg/dL — AB (ref 70–140)
Potassium: 4.5 mEq/L (ref 3.5–5.1)
Sodium: 140 mEq/L (ref 136–145)
TOTAL PROTEIN: 7.3 g/dL (ref 6.4–8.3)
Total Bilirubin: 0.37 mg/dL (ref 0.20–1.20)

## 2016-11-13 LAB — UA PROTEIN, DIPSTICK - CHCC: Protein, ur: NEGATIVE mg/dL

## 2016-11-13 MED ORDER — PALONOSETRON HCL INJECTION 0.25 MG/5ML
0.2500 mg | Freq: Once | INTRAVENOUS | Status: AC
  Administered 2016-11-13: 0.25 mg via INTRAVENOUS

## 2016-11-13 MED ORDER — SODIUM CHLORIDE 0.9 % IV SOLN
180.0000 mg/m2 | Freq: Once | INTRAVENOUS | Status: AC
  Administered 2016-11-13: 560 mg via INTRAVENOUS
  Filled 2016-11-13: qty 15

## 2016-11-13 MED ORDER — PALONOSETRON HCL INJECTION 0.25 MG/5ML
INTRAVENOUS | Status: AC
  Filled 2016-11-13: qty 5

## 2016-11-13 MED ORDER — ATROPINE SULFATE 1 MG/ML IJ SOLN
INTRAMUSCULAR | Status: AC
  Filled 2016-11-13: qty 1

## 2016-11-13 MED ORDER — DEXAMETHASONE SODIUM PHOSPHATE 10 MG/ML IJ SOLN
INTRAMUSCULAR | Status: AC
  Filled 2016-11-13: qty 1

## 2016-11-13 MED ORDER — BEVACIZUMAB CHEMO INJECTION 400 MG/16ML
5.1000 mg/kg | Freq: Once | INTRAVENOUS | Status: AC
  Administered 2016-11-13: 900 mg via INTRAVENOUS
  Filled 2016-11-13: qty 32

## 2016-11-13 MED ORDER — SODIUM CHLORIDE 0.9 % IV SOLN
400.0000 mg/m2 | Freq: Once | INTRAVENOUS | Status: AC
  Administered 2016-11-13: 1224 mg via INTRAVENOUS
  Filled 2016-11-13: qty 61.2

## 2016-11-13 MED ORDER — SODIUM CHLORIDE 0.9 % IV SOLN
Freq: Once | INTRAVENOUS | Status: AC
  Administered 2016-11-13: 13:00:00 via INTRAVENOUS

## 2016-11-13 MED ORDER — ATROPINE SULFATE 1 MG/ML IJ SOLN
0.5000 mg | Freq: Once | INTRAMUSCULAR | Status: AC | PRN
  Administered 2016-11-13: 0.5 mg via INTRAVENOUS

## 2016-11-13 MED ORDER — SODIUM CHLORIDE 0.9% FLUSH
10.0000 mL | INTRAVENOUS | Status: DC | PRN
  Administered 2016-11-13: 10 mL via INTRAVENOUS
  Filled 2016-11-13: qty 10

## 2016-11-13 MED ORDER — SODIUM CHLORIDE 0.9 % IV SOLN
2400.0000 mg/m2 | INTRAVENOUS | Status: DC
  Administered 2016-11-13: 7350 mg via INTRAVENOUS
  Filled 2016-11-13: qty 147

## 2016-11-13 MED ORDER — DEXAMETHASONE SODIUM PHOSPHATE 10 MG/ML IJ SOLN
10.0000 mg | Freq: Once | INTRAMUSCULAR | Status: AC
  Administered 2016-11-13: 10 mg via INTRAVENOUS

## 2016-11-13 NOTE — Patient Instructions (Signed)
[facility] Discharge Instructions for Patients Receiving Chemotherapy  Today you received the following chemotherapy agents Avastin/5 FU/Irinotecan/Leucovorin  To help prevent nausea and vomiting after your treatment, we encourage you to take your nausea medication as prescribed.   If you develop nausea and vomiting that is not controlled by your nausea medication, call the clinic.   BELOW ARE SYMPTOMS THAT SHOULD BE REPORTED IMMEDIATELY:  *FEVER GREATER THAN 100.5 F  *CHILLS WITH OR WITHOUT FEVER  NAUSEA AND VOMITING THAT IS NOT CONTROLLED WITH YOUR NAUSEA MEDICATION  *UNUSUAL SHORTNESS OF BREATH  *UNUSUAL BRUISING OR BLEEDING  TENDERNESS IN MOUTH AND THROAT WITH OR WITHOUT PRESENCE OF ULCERS  *URINARY PROBLEMS  *BOWEL PROBLEMS  UNUSUAL RASH Items with * indicate a potential emergency and should be followed up as soon as possible.  Feel free to call the clinic you have any questions or concerns. The clinic phone number is [phone].  Please show the [facility] at check-in to the Emergency Department and triage nurse.

## 2016-11-13 NOTE — Telephone Encounter (Signed)
LMVM for patient inquiring if he received his RX for a new meter and strips. Advised patient they were faxed to [facility] 6 days ago, but if he did not get them to call me back.

## 2016-11-15 ENCOUNTER — Ambulatory Visit (HOSPITAL_BASED_OUTPATIENT_CLINIC_OR_DEPARTMENT_OTHER): Payer: BLUE CROSS/BLUE SHIELD

## 2016-11-15 ENCOUNTER — Encounter: Payer: Self-pay | Admitting: Hematology

## 2016-11-15 VITALS — BP 166/93 | HR 87 | Temp 98.9°F | Resp 20

## 2016-11-15 DIAGNOSIS — C186 Malignant neoplasm of descending colon: Secondary | ICD-10-CM | POA: Diagnosis not present

## 2016-11-15 MED ORDER — HEPARIN SOD (PORK) LOCK FLUSH 100 UNIT/ML IV SOLN
500.0000 [IU] | Freq: Once | INTRAVENOUS | Status: AC | PRN
  Administered 2016-11-15: 500 [IU]
  Filled 2016-11-15: qty 5

## 2016-11-15 MED ORDER — SODIUM CHLORIDE 0.9% FLUSH
10.0000 mL | INTRAVENOUS | Status: DC | PRN
  Administered 2016-11-15: 10 mL
  Filled 2016-11-15: qty 10

## 2016-11-27 ENCOUNTER — Other Ambulatory Visit (HOSPITAL_BASED_OUTPATIENT_CLINIC_OR_DEPARTMENT_OTHER): Payer: BLUE CROSS/BLUE SHIELD

## 2016-11-27 ENCOUNTER — Ambulatory Visit (HOSPITAL_BASED_OUTPATIENT_CLINIC_OR_DEPARTMENT_OTHER): Payer: BLUE CROSS/BLUE SHIELD

## 2016-11-27 VITALS — BP 138/82 | HR 77 | Temp 98.4°F | Resp 20

## 2016-11-27 DIAGNOSIS — C787 Secondary malignant neoplasm of liver and intrahepatic bile duct: Secondary | ICD-10-CM

## 2016-11-27 DIAGNOSIS — Z5112 Encounter for antineoplastic immunotherapy: Secondary | ICD-10-CM

## 2016-11-27 DIAGNOSIS — C186 Malignant neoplasm of descending colon: Secondary | ICD-10-CM | POA: Diagnosis not present

## 2016-11-27 DIAGNOSIS — C786 Secondary malignant neoplasm of retroperitoneum and peritoneum: Secondary | ICD-10-CM

## 2016-11-27 DIAGNOSIS — C19 Malignant neoplasm of rectosigmoid junction: Secondary | ICD-10-CM

## 2016-11-27 DIAGNOSIS — Z5111 Encounter for antineoplastic chemotherapy: Secondary | ICD-10-CM

## 2016-11-27 LAB — CBC WITH DIFFERENTIAL/PLATELET
BASO%: 0.6 % (ref 0.0–2.0)
BASOS ABS: 0 10*3/uL (ref 0.0–0.1)
EOS ABS: 0.2 10*3/uL (ref 0.0–0.5)
EOS%: 3.2 % (ref 0.0–7.0)
HEMATOCRIT: 39.8 % (ref 38.4–49.9)
HGB: 13.6 g/dL (ref 13.0–17.1)
LYMPH#: 2.2 10*3/uL (ref 0.9–3.3)
LYMPH%: 35.4 % (ref 14.0–49.0)
MCH: 33.6 pg — AB (ref 27.2–33.4)
MCHC: 34.1 g/dL (ref 32.0–36.0)
MCV: 98.4 fL — AB (ref 79.3–98.0)
MONO#: 0.7 10*3/uL (ref 0.1–0.9)
MONO%: 10.8 % (ref 0.0–14.0)
NEUT#: 3.1 10*3/uL (ref 1.5–6.5)
NEUT%: 50 % (ref 39.0–75.0)
PLATELETS: 242 10*3/uL (ref 140–400)
RBC: 4.05 10*6/uL — ABNORMAL LOW (ref 4.20–5.82)
RDW: 14.5 % (ref 11.0–14.6)
WBC: 6.1 10*3/uL (ref 4.0–10.3)

## 2016-11-27 LAB — COMPREHENSIVE METABOLIC PANEL
ALK PHOS: 76 U/L (ref 40–150)
ALT: 27 U/L (ref 0–55)
ANION GAP: 11 meq/L (ref 3–11)
AST: 20 U/L (ref 5–34)
Albumin: 3.6 g/dL (ref 3.5–5.0)
BUN: 12.1 mg/dL (ref 7.0–26.0)
CALCIUM: 9.6 mg/dL (ref 8.4–10.4)
CHLORIDE: 103 meq/L (ref 98–109)
CO2: 25 mEq/L (ref 22–29)
Creatinine: 1.1 mg/dL (ref 0.7–1.3)
EGFR: 76 mL/min/{1.73_m2} — AB (ref >90)
Glucose: 270 mg/dl — ABNORMAL HIGH (ref 70–140)
POTASSIUM: 4.2 meq/L (ref 3.5–5.1)
Sodium: 139 mEq/L (ref 136–145)
Total Bilirubin: 0.61 mg/dL (ref 0.20–1.20)
Total Protein: 7.4 g/dL (ref 6.4–8.3)

## 2016-11-27 LAB — CEA (IN HOUSE-CHCC)

## 2016-11-27 MED ORDER — PALONOSETRON HCL INJECTION 0.25 MG/5ML
INTRAVENOUS | Status: AC
  Filled 2016-11-27: qty 5

## 2016-11-27 MED ORDER — LEUCOVORIN CALCIUM INJECTION 350 MG
400.0000 mg/m2 | Freq: Once | INTRAMUSCULAR | Status: AC
  Administered 2016-11-27: 1224 mg via INTRAVENOUS
  Filled 2016-11-27: qty 61.2

## 2016-11-27 MED ORDER — PALONOSETRON HCL INJECTION 0.25 MG/5ML
0.2500 mg | Freq: Once | INTRAVENOUS | Status: AC
  Administered 2016-11-27: 0.25 mg via INTRAVENOUS

## 2016-11-27 MED ORDER — SODIUM CHLORIDE 0.9% FLUSH
10.0000 mL | INTRAVENOUS | Status: DC | PRN
  Filled 2016-11-27: qty 10

## 2016-11-27 MED ORDER — DEXAMETHASONE SODIUM PHOSPHATE 10 MG/ML IJ SOLN
INTRAMUSCULAR | Status: AC
  Filled 2016-11-27: qty 1

## 2016-11-27 MED ORDER — PROCHLORPERAZINE EDISYLATE 5 MG/ML IJ SOLN
10.0000 mg | Freq: Once | INTRAMUSCULAR | Status: AC
  Administered 2016-11-27: 10 mg via INTRAVENOUS

## 2016-11-27 MED ORDER — SODIUM CHLORIDE 0.9 % IV SOLN
Freq: Once | INTRAVENOUS | Status: AC
  Administered 2016-11-27: 09:00:00 via INTRAVENOUS

## 2016-11-27 MED ORDER — HEPARIN SOD (PORK) LOCK FLUSH 100 UNIT/ML IV SOLN
500.0000 [IU] | Freq: Once | INTRAVENOUS | Status: DC | PRN
  Filled 2016-11-27: qty 5

## 2016-11-27 MED ORDER — FLUOROURACIL CHEMO INJECTION 5 GM/100ML
2400.0000 mg/m2 | INTRAVENOUS | Status: DC
  Administered 2016-11-27: 7350 mg via INTRAVENOUS
  Filled 2016-11-27: qty 147

## 2016-11-27 MED ORDER — SODIUM CHLORIDE 0.9 % IV SOLN
5.1000 mg/kg | Freq: Once | INTRAVENOUS | Status: AC
  Administered 2016-11-27: 900 mg via INTRAVENOUS
  Filled 2016-11-27: qty 32

## 2016-11-27 MED ORDER — PROCHLORPERAZINE EDISYLATE 5 MG/ML IJ SOLN
INTRAMUSCULAR | Status: AC
  Filled 2016-11-27: qty 2

## 2016-11-27 MED ORDER — ATROPINE SULFATE 1 MG/ML IJ SOLN
0.5000 mg | Freq: Once | INTRAMUSCULAR | Status: AC | PRN
  Administered 2016-11-27: 0.5 mg via INTRAVENOUS

## 2016-11-27 MED ORDER — DEXAMETHASONE SODIUM PHOSPHATE 10 MG/ML IJ SOLN
10.0000 mg | Freq: Once | INTRAMUSCULAR | Status: AC
  Administered 2016-11-27: 10 mg via INTRAVENOUS

## 2016-11-27 MED ORDER — ATROPINE SULFATE 1 MG/ML IJ SOLN
INTRAMUSCULAR | Status: AC
  Filled 2016-11-27: qty 1

## 2016-11-27 MED ORDER — SODIUM CHLORIDE 0.9 % IV SOLN
190.0000 mg/m2 | Freq: Once | INTRAVENOUS | Status: AC
  Administered 2016-11-27: 580 mg via INTRAVENOUS
  Filled 2016-11-27: qty 15

## 2016-11-27 NOTE — Patient Instructions (Signed)
[facility] Discharge Instructions for Patients Receiving Chemotherapy  Today you received the following chemotherapy agents bevacizumab (Avastin), irinotecan (Camptosar), leucovorin, fluorouracil (5FU).   To help prevent nausea and vomiting after your treatment, we encourage you to take your nausea medication as prescribed.   If you develop nausea and vomiting that is not controlled by your nausea medication, call the clinic.   BELOW ARE SYMPTOMS THAT SHOULD BE REPORTED IMMEDIATELY:  *FEVER GREATER THAN 100.5 F  *CHILLS WITH OR WITHOUT FEVER  NAUSEA AND VOMITING THAT IS NOT CONTROLLED WITH YOUR NAUSEA MEDICATION  *UNUSUAL SHORTNESS OF BREATH  *UNUSUAL BRUISING OR BLEEDING  TENDERNESS IN MOUTH AND THROAT WITH OR WITHOUT PRESENCE OF ULCERS  *URINARY PROBLEMS  *BOWEL PROBLEMS  UNUSUAL RASH Items with * indicate a potential emergency and should be followed up as soon as possible.  Feel free to call the clinic you have any questions or concerns. The clinic phone number is [phone].  Please show the [facility] at check-in to the Emergency Department and triage nurse.

## 2016-11-27 NOTE — Progress Notes (Signed)
Pt experienced progressive nausea, but had previously refused intervention. At 1215 pt began to vomit. Moderate amount of green emesis was result. Dr. Burr Medico contacted and 10mg  compazine IV ordered. Administered to pt and allowed symptoms to decrease before proceeding with chemotherapy treatment.

## 2016-11-29 ENCOUNTER — Ambulatory Visit (HOSPITAL_BASED_OUTPATIENT_CLINIC_OR_DEPARTMENT_OTHER): Payer: BLUE CROSS/BLUE SHIELD

## 2016-11-29 VITALS — BP 155/86 | HR 102 | Temp 97.1°F | Resp 16

## 2016-11-29 DIAGNOSIS — C186 Malignant neoplasm of descending colon: Secondary | ICD-10-CM | POA: Diagnosis not present

## 2016-11-29 MED ORDER — SODIUM CHLORIDE 0.9% FLUSH
10.0000 mL | INTRAVENOUS | Status: DC | PRN
  Administered 2016-11-29: 10 mL
  Filled 2016-11-29: qty 10

## 2016-11-29 MED ORDER — HEPARIN SOD (PORK) LOCK FLUSH 100 UNIT/ML IV SOLN
500.0000 [IU] | Freq: Once | INTRAVENOUS | Status: AC | PRN
  Administered 2016-11-29: 500 [IU]
  Filled 2016-11-29: qty 5

## 2016-11-29 NOTE — Patient Instructions (Signed)
Implanted Port Home Guide An implanted port is a type of central line that is placed under the skin. Central lines are used to provide IV access when treatment or nutrition needs to be given through a person's veins. Implanted ports are used for long-term IV access. An implanted port may be placed because:  You need IV medicine that would be irritating to the small veins in your hands or arms.  You need long-term IV medicines, such as antibiotics.  You need IV nutrition for a long period.  You need frequent blood draws for lab tests.  You need dialysis.  Implanted ports are usually placed in the chest area, but they can also be placed in the upper arm, the abdomen, or the leg. An implanted port has two main parts:  Reservoir. The reservoir is round and will appear as a small, raised area under your skin. The reservoir is the part where a needle is inserted to give medicines or draw blood.  Catheter. The catheter is a thin, flexible tube that extends from the reservoir. The catheter is placed into a large vein. Medicine that is inserted into the reservoir goes into the catheter and then into the vein.  How will I care for my incision site? Do not get the incision site wet. Bathe or shower as directed by your health care provider. How is my port accessed? Special steps must be taken to access the port:  Before the port is accessed, a numbing cream can be placed on the skin. This helps numb the skin over the port site.  Your health care provider uses a sterile technique to access the port. ? Your health care provider must put on a mask and sterile gloves. ? The skin over your port is cleaned carefully with an antiseptic and allowed to dry. ? The port is gently pinched between sterile gloves, and a needle is inserted into the port.  Only "non-coring" port needles should be used to access the port. Once the port is accessed, a blood return should be checked. This helps ensure that the port  is in the vein and is not clogged.  If your port needs to remain accessed for a constant infusion, a clear (transparent) bandage will be placed over the needle site. The bandage and needle will need to be changed every week, or as directed by your health care provider.  Keep the bandage covering the needle clean and dry. Do not get it wet. Follow your health care provider's instructions on how to take a shower or bath while the port is accessed.  If your port does not need to stay accessed, no bandage is needed over the port.  What is flushing? Flushing helps keep the port from getting clogged. Follow your health care provider's instructions on how and when to flush the port. Ports are usually flushed with saline solution or a medicine called heparin. The need for flushing will depend on how the port is used.  If the port is used for intermittent medicines or blood draws, the port will need to be flushed: ? After medicines have been given. ? After blood has been drawn. ? As part of routine maintenance.  If a constant infusion is running, the port may not need to be flushed.  How long will my port stay implanted? The port can stay in for as long as your health care provider thinks it is needed. When it is time for the port to come out, surgery will be   done to remove it. The procedure is similar to the one performed when the port was put in. When should I seek immediate medical care? When you have an implanted port, you should seek immediate medical care if:  You notice a bad smell coming from the incision site.  You have swelling, redness, or drainage at the incision site.  You have more swelling or pain at the port site or the surrounding area.  You have a fever that is not controlled with medicine.  This information is not intended to replace advice given to you by your health care provider. Make sure you discuss any questions you have with your health care provider. Document  Released: 07/01/2005 Document Revised: 12/07/2015 Document Reviewed: 03/08/2013 Elsevier Interactive Patient Education  2017 [employer]  

## 2016-12-01 NOTE — Progress Notes (Signed)
Patient was supposed to be seen today by a colleague in order to have his diabetic foot ulcer debrided of the necrotic tissue after x-ray but patient left AMA after x-ray stating he had to go to work and was never seen by provider.

## 2016-12-04 ENCOUNTER — Telehealth: Payer: Self-pay | Admitting: *Deleted

## 2016-12-04 MED ORDER — DIPHENOXYLATE-ATROPINE 2.5-0.025 MG PO TABS: 1.0000 | ORAL_TABLET | Freq: Four times a day (QID) | ORAL | 1 refills | Status: DC | PRN

## 2016-12-04 NOTE — Telephone Encounter (Signed)
Received vm call from pt stating that he has dizziness & diarrhea.  He is requesting scripts for both.  He says that he had constipation 1st few days after chemo & took stool softeners & now has gone the other way. He said he had diarrhea every 15 " first 2 days & has gotten a little better with imodium but has had 7 stools today.  He reports that imodium is working & he wants something stronger.  He reports that he doesn't feel like he is dehydrated & can usually tell b/c he gets a h/a.  He reports drinking plenty of oral fluids. He has not checked his BP.  Per Dr Burr Medico, informed to stop BP med & check BP & come in tomorrow for IVF.  Pt refused due to work & insists that he is not dehydrated.  He agrees to check BP & touch base tomorrow.  Lomotil called to pharmacy.  BRAT diet discussed but not sure pt is agreeable to try.

## 2016-12-10 ENCOUNTER — Telehealth: Payer: Self-pay | Admitting: *Deleted

## 2016-12-10 ENCOUNTER — Ambulatory Visit (HOSPITAL_COMMUNITY): Admission: RE | Admit: 2016-12-10 | Payer: BLUE CROSS/BLUE SHIELD | Source: Ambulatory Visit

## 2016-12-10 ENCOUNTER — Telehealth: Payer: Self-pay | Admitting: Family Medicine

## 2016-12-10 MED ORDER — CEPHALEXIN 500 MG PO CAPS: 500.0000 mg | ORAL_CAPSULE | Freq: Four times a day (QID) | ORAL | 0 refills | Status: DC

## 2016-12-10 MED ORDER — SULFAMETHOXAZOLE-TRIMETHOPRIM 800-160 MG PO TABS: 2.0000 | ORAL_TABLET | Freq: Two times a day (BID) | ORAL | 0 refills | Status: DC

## 2016-12-10 NOTE — Telephone Encounter (Signed)
Received vm from pt asking to r/s his appt tomorrow for infusion to 1 wk out due to having other issues right now.  Message to Dr Burr Medico & will route to schedulers with her OK.

## 2016-12-10 NOTE — Telephone Encounter (Signed)
"  I need to cancel my appointments for tomorrow.  I will call back to reschedule.  I have an infection in my foot.  Ned to cancel these in order for my other doctor to schedule an appointment.  The antibiotics are not working and need to be seen.  I've already left a message on the nurse line."    Appointments cancelled per patient's request.

## 2016-12-10 NOTE — Telephone Encounter (Signed)
Pt advised and has an appt tomorrow 9am

## 2016-12-10 NOTE — Telephone Encounter (Signed)
He absolutely needs to be seen - this is urgent - and probably even emergent. He needs to be seen at 102,  - it needed to be drained and debrided as well as cultured with all of the resistent bacteria and at HIGH risk due to uncontrolled insulin dependent diabetes. Treating this without evaluation would be hugely irresponsible - this is the type of thing that can end up in amputation if not treated with the correct antibiotics and if the infection/dead tissue is not physically removed there is no amount of antibiotics that will cure this.  I will send him in a few days worth so that he can get started but he HAS to be seen.  AND DO NOT SCHED AT 104 FOR THIS - OK TO SCHED W/ ANOTHER PROVIDER/PA FOR THE WOUND EVAL/DEBRIDEMENT if needed- needs to be put into first avail slot, not wait until I am in office or have an opening.  If pt prefers, I can refer him to podiatry for this.

## 2016-12-10 NOTE — Telephone Encounter (Signed)
Pt will not be able to come into the clinic today but he will come into office first thing in the morning

## 2016-12-10 NOTE — Telephone Encounter (Signed)
Wound  On toes healed almost completley but last few days its opened up and looks like he needs more antibiotics. I advised he needs an office visit, he states he doesn't have the money and hopes he can just get a refill of meds. Please advise

## 2016-12-10 NOTE — Telephone Encounter (Signed)
Pt is needing a refill on his antibodics regarding his foot   Best number [phone]

## 2016-12-11 ENCOUNTER — Ambulatory Visit (INDEPENDENT_AMBULATORY_CARE_PROVIDER_SITE_OTHER): Payer: BLUE CROSS/BLUE SHIELD | Admitting: Urgent Care

## 2016-12-11 ENCOUNTER — Other Ambulatory Visit: Payer: BLUE CROSS/BLUE SHIELD

## 2016-12-11 ENCOUNTER — Inpatient Hospital Stay (HOSPITAL_COMMUNITY)
Admission: EM | Admit: 2016-12-11 | Discharge: 2016-12-18 | DRG: 854 | Disposition: A | Discharge status: Home Health | Payer: BLUE CROSS/BLUE SHIELD | Attending: Internal Medicine | Admitting: Internal Medicine

## 2016-12-11 ENCOUNTER — Encounter (HOSPITAL_COMMUNITY): Payer: Self-pay | Admitting: Emergency Medicine

## 2016-12-11 ENCOUNTER — Emergency Department (HOSPITAL_COMMUNITY): Payer: BLUE CROSS/BLUE SHIELD

## 2016-12-11 ENCOUNTER — Ambulatory Visit: Payer: BLUE CROSS/BLUE SHIELD

## 2016-12-11 ENCOUNTER — Ambulatory Visit: Payer: BLUE CROSS/BLUE SHIELD | Admitting: Hematology

## 2016-12-11 ENCOUNTER — Encounter: Payer: Self-pay | Admitting: Urgent Care

## 2016-12-11 VITALS — BP 128/89 | HR 130 | Temp 97.4°F | Resp 20 | Ht 75.0 in | Wt 373.2 lb

## 2016-12-11 DIAGNOSIS — D473 Essential (hemorrhagic) thrombocythemia: Secondary | ICD-10-CM

## 2016-12-11 DIAGNOSIS — K219 Gastro-esophageal reflux disease without esophagitis: Secondary | ICD-10-CM | POA: Diagnosis present

## 2016-12-11 DIAGNOSIS — I2510 Atherosclerotic heart disease of native coronary artery without angina pectoris: Secondary | ICD-10-CM | POA: Diagnosis present

## 2016-12-11 DIAGNOSIS — E11628 Type 2 diabetes mellitus with other skin complications: Secondary | ICD-10-CM

## 2016-12-11 DIAGNOSIS — E1152 Type 2 diabetes mellitus with diabetic peripheral angiopathy with gangrene: Secondary | ICD-10-CM | POA: Diagnosis present

## 2016-12-11 DIAGNOSIS — Z800 Family history of malignant neoplasm of digestive organs: Secondary | ICD-10-CM

## 2016-12-11 DIAGNOSIS — D72829 Elevated white blood cell count, unspecified: Secondary | ICD-10-CM | POA: Diagnosis not present

## 2016-12-11 DIAGNOSIS — F17220 Nicotine dependence, chewing tobacco, uncomplicated: Secondary | ICD-10-CM | POA: Diagnosis present

## 2016-12-11 DIAGNOSIS — D649 Anemia, unspecified: Secondary | ICD-10-CM | POA: Diagnosis present

## 2016-12-11 DIAGNOSIS — Z794 Long term (current) use of insulin: Secondary | ICD-10-CM

## 2016-12-11 DIAGNOSIS — E1165 Type 2 diabetes mellitus with hyperglycemia: Secondary | ICD-10-CM | POA: Diagnosis present

## 2016-12-11 DIAGNOSIS — M869 Osteomyelitis, unspecified: Secondary | ICD-10-CM | POA: Diagnosis present

## 2016-12-11 DIAGNOSIS — Z8249 Family history of ischemic heart disease and other diseases of the circulatory system: Secondary | ICD-10-CM | POA: Diagnosis not present

## 2016-12-11 DIAGNOSIS — A419 Sepsis, unspecified organism: Secondary | ICD-10-CM | POA: Diagnosis present

## 2016-12-11 DIAGNOSIS — B9562 Methicillin resistant Staphylococcus aureus infection as the cause of diseases classified elsewhere: Secondary | ICD-10-CM | POA: Diagnosis not present

## 2016-12-11 DIAGNOSIS — K760 Fatty (change of) liver, not elsewhere classified: Secondary | ICD-10-CM | POA: Diagnosis present

## 2016-12-11 DIAGNOSIS — Z978 Presence of other specified devices: Secondary | ICD-10-CM | POA: Diagnosis not present

## 2016-12-11 DIAGNOSIS — D75839 Thrombocytosis, unspecified: Secondary | ICD-10-CM

## 2016-12-11 DIAGNOSIS — N179 Acute kidney failure, unspecified: Secondary | ICD-10-CM | POA: Diagnosis present

## 2016-12-11 DIAGNOSIS — Z808 Family history of malignant neoplasm of other organs or systems: Secondary | ICD-10-CM

## 2016-12-11 DIAGNOSIS — B951 Streptococcus, group B, as the cause of diseases classified elsewhere: Secondary | ICD-10-CM | POA: Diagnosis not present

## 2016-12-11 DIAGNOSIS — N135 Crossing vessel and stricture of ureter without hydronephrosis: Secondary | ICD-10-CM | POA: Diagnosis not present

## 2016-12-11 DIAGNOSIS — D62 Acute posthemorrhagic anemia: Secondary | ICD-10-CM | POA: Diagnosis not present

## 2016-12-11 DIAGNOSIS — Z7982 Long term (current) use of aspirin: Secondary | ICD-10-CM

## 2016-12-11 DIAGNOSIS — E11621 Type 2 diabetes mellitus with foot ulcer: Secondary | ICD-10-CM

## 2016-12-11 DIAGNOSIS — E1142 Type 2 diabetes mellitus with diabetic polyneuropathy: Secondary | ICD-10-CM | POA: Diagnosis not present

## 2016-12-11 DIAGNOSIS — M86172 Other acute osteomyelitis, left ankle and foot: Secondary | ICD-10-CM

## 2016-12-11 DIAGNOSIS — E1169 Type 2 diabetes mellitus with other specified complication: Secondary | ICD-10-CM | POA: Diagnosis present

## 2016-12-11 DIAGNOSIS — N289 Disorder of kidney and ureter, unspecified: Secondary | ICD-10-CM

## 2016-12-11 DIAGNOSIS — L97529 Non-pressure chronic ulcer of other part of left foot with unspecified severity: Secondary | ICD-10-CM | POA: Diagnosis not present

## 2016-12-11 DIAGNOSIS — L03116 Cellulitis of left lower limb: Secondary | ICD-10-CM | POA: Diagnosis present

## 2016-12-11 DIAGNOSIS — M79672 Pain in left foot: Secondary | ICD-10-CM | POA: Diagnosis present

## 2016-12-11 DIAGNOSIS — L089 Local infection of the skin and subcutaneous tissue, unspecified: Secondary | ICD-10-CM | POA: Diagnosis not present

## 2016-12-11 DIAGNOSIS — Z89422 Acquired absence of other left toe(s): Secondary | ICD-10-CM | POA: Diagnosis not present

## 2016-12-11 DIAGNOSIS — I10 Essential (primary) hypertension: Secondary | ICD-10-CM | POA: Diagnosis present

## 2016-12-11 DIAGNOSIS — A4102 Sepsis due to Methicillin resistant Staphylococcus aureus: Secondary | ICD-10-CM | POA: Diagnosis not present

## 2016-12-11 DIAGNOSIS — I96 Gangrene, not elsewhere classified: Secondary | ICD-10-CM | POA: Diagnosis present

## 2016-12-11 DIAGNOSIS — Z95828 Presence of other vascular implants and grafts: Secondary | ICD-10-CM | POA: Diagnosis not present

## 2016-12-11 DIAGNOSIS — R000 Tachycardia, unspecified: Secondary | ICD-10-CM

## 2016-12-11 DIAGNOSIS — C799 Secondary malignant neoplasm of unspecified site: Secondary | ICD-10-CM

## 2016-12-11 DIAGNOSIS — E1140 Type 2 diabetes mellitus with diabetic neuropathy, unspecified: Secondary | ICD-10-CM | POA: Diagnosis present

## 2016-12-11 DIAGNOSIS — F17290 Nicotine dependence, other tobacco product, uncomplicated: Secondary | ICD-10-CM | POA: Diagnosis not present

## 2016-12-11 DIAGNOSIS — Z9221 Personal history of antineoplastic chemotherapy: Secondary | ICD-10-CM

## 2016-12-11 DIAGNOSIS — Z833 Family history of diabetes mellitus: Secondary | ICD-10-CM | POA: Diagnosis not present

## 2016-12-11 DIAGNOSIS — F1021 Alcohol dependence, in remission: Secondary | ICD-10-CM | POA: Diagnosis not present

## 2016-12-11 DIAGNOSIS — R21 Rash and other nonspecific skin eruption: Secondary | ICD-10-CM | POA: Diagnosis not present

## 2016-12-11 DIAGNOSIS — E118 Type 2 diabetes mellitus with unspecified complications: Secondary | ICD-10-CM | POA: Diagnosis not present

## 2016-12-11 DIAGNOSIS — E860 Dehydration: Secondary | ICD-10-CM | POA: Diagnosis present

## 2016-12-11 DIAGNOSIS — Z6841 Body Mass Index (BMI) 40.0 and over, adult: Secondary | ICD-10-CM | POA: Diagnosis not present

## 2016-12-11 DIAGNOSIS — E876 Hypokalemia: Secondary | ICD-10-CM | POA: Diagnosis present

## 2016-12-11 DIAGNOSIS — Z79899 Other long term (current) drug therapy: Secondary | ICD-10-CM

## 2016-12-11 DIAGNOSIS — E871 Hypo-osmolality and hyponatremia: Secondary | ICD-10-CM | POA: Diagnosis present

## 2016-12-11 DIAGNOSIS — C189 Malignant neoplasm of colon, unspecified: Secondary | ICD-10-CM | POA: Diagnosis not present

## 2016-12-11 DIAGNOSIS — N1330 Unspecified hydronephrosis: Secondary | ICD-10-CM | POA: Diagnosis not present

## 2016-12-11 DIAGNOSIS — C186 Malignant neoplasm of descending colon: Secondary | ICD-10-CM | POA: Diagnosis present

## 2016-12-11 DIAGNOSIS — L0889 Other specified local infections of the skin and subcutaneous tissue: Secondary | ICD-10-CM | POA: Diagnosis not present

## 2016-12-11 DIAGNOSIS — IMO0002 Reserved for concepts with insufficient information to code with codable children: Secondary | ICD-10-CM

## 2016-12-11 LAB — COMPREHENSIVE METABOLIC PANEL
ALBUMIN: 3.4 g/dL — AB (ref 3.5–5.0)
ALT: 20 U/L (ref 17–63)
AST: 18 U/L (ref 15–41)
Alkaline Phosphatase: 80 U/L (ref 38–126)
Anion gap: 10 (ref 5–15)
BUN: 12 mg/dL (ref 6–20)
CHLORIDE: 97 mmol/L — AB (ref 101–111)
CO2: 23 mmol/L (ref 22–32)
CREATININE: 1.36 mg/dL — AB (ref 0.61–1.24)
Calcium: 8.9 mg/dL (ref 8.9–10.3)
GFR calc Af Amer: >60 mL/min (ref >60)
GFR calc non Af Amer: >60 mL/min (ref >60)
GLUCOSE: 222 mg/dL — AB (ref 65–99)
Potassium: 4.4 mmol/L (ref 3.5–5.1)
SODIUM: 130 mmol/L — AB (ref 135–145)
Total Bilirubin: 1.3 mg/dL — ABNORMAL HIGH (ref 0.3–1.2)
Total Protein: 7.6 g/dL (ref 6.5–8.1)

## 2016-12-11 LAB — POCT CBC
Granulocyte percent: 59.8 %G (ref 37–80)
HEMATOCRIT: 38.5 % — AB (ref 43.5–53.7)
Hemoglobin: 13.7 g/dL — AB (ref 14.1–18.1)
LYMPH, POC: 3.6 — AB (ref 0.6–3.4)
MCH, POC: 33.4 pg — AB (ref 27–31.2)
MCHC: 35.6 g/dL — AB (ref 31.8–35.4)
MCV: 94 fL (ref 80–97)
MID (cbc): 1.1 — AB (ref 0–0.9)
MPV: 6.4 fL (ref 0–99.8)
PLATELET COUNT, POC: 585 10*3/uL — AB (ref 142–424)
POC Granulocyte: 7 — AB (ref 2–6.9)
POC LYMPH PERCENT: 30.9 %L (ref 10–50)
POC MID %: 9.3 %M (ref 0–12)
RBC: 4.09 M/uL — AB (ref 4.69–6.13)
RDW, POC: 13.9 %
WBC: 11.7 10*3/uL — AB (ref 4.6–10.2)

## 2016-12-11 LAB — CBC WITH DIFFERENTIAL/PLATELET
Basophils Absolute: 0 10*3/uL (ref 0.0–0.1)
Basophils Relative: 0 %
EOS ABS: 0.2 10*3/uL (ref 0.0–0.7)
EOS PCT: 2 %
HCT: 35.6 % — ABNORMAL LOW (ref 39.0–52.0)
Hemoglobin: 12.1 g/dL — ABNORMAL LOW (ref 13.0–17.0)
Lymphocytes Relative: 27 %
Lymphs Abs: 3.3 10*3/uL (ref 0.7–4.0)
MCH: 32.1 pg (ref 26.0–34.0)
MCHC: 34 g/dL (ref 30.0–36.0)
MCV: 94.4 fL (ref 78.0–100.0)
MONO ABS: 2 10*3/uL — AB (ref 0.1–1.0)
MONOS PCT: 17 %
NEUTROS PCT: 54 %
Neutro Abs: 6.5 10*3/uL (ref 1.7–7.7)
Platelets: 465 10*3/uL — ABNORMAL HIGH (ref 150–400)
RBC: 3.77 MIL/uL — ABNORMAL LOW (ref 4.22–5.81)
RDW: 13.5 % (ref 11.5–15.5)
WBC: 12 10*3/uL — ABNORMAL HIGH (ref 4.0–10.5)

## 2016-12-11 LAB — URINALYSIS, ROUTINE W REFLEX MICROSCOPIC
BILIRUBIN URINE: NEGATIVE
Glucose, UA: NEGATIVE mg/dL
HGB URINE DIPSTICK: NEGATIVE
KETONES UR: NEGATIVE mg/dL
Leukocytes, UA: NEGATIVE
Nitrite: NEGATIVE
Protein, ur: 30 mg/dL — AB
SPECIFIC GRAVITY, URINE: 1.02 (ref 1.005–1.030)
pH: 5 (ref 5.0–8.0)

## 2016-12-11 LAB — GLUCOSE, CAPILLARY: GLUCOSE-CAPILLARY: 175 mg/dL — AB (ref 65–99)

## 2016-12-11 LAB — I-STAT CG4 LACTIC ACID, ED: LACTIC ACID, VENOUS: 1.5 mmol/L (ref 0.5–1.9)

## 2016-12-11 MED ORDER — OXYCODONE HCL 5 MG PO TABS
10.0000 mg | ORAL_TABLET | ORAL | Status: DC | PRN
  Administered 2016-12-12 – 2016-12-18 (×23): 10 mg via ORAL
  Filled 2016-12-11 (×26): qty 2

## 2016-12-11 MED ORDER — INSULIN ASPART 100 UNIT/ML ~~LOC~~ SOLN
0.0000 [IU] | Freq: Every day | SUBCUTANEOUS | Status: DC
  Administered 2016-12-15: 2 [IU] via SUBCUTANEOUS

## 2016-12-11 MED ORDER — VANCOMYCIN HCL IN DEXTROSE 1-5 GM/200ML-% IV SOLN
1000.0000 mg | Freq: Once | INTRAVENOUS | Status: AC
  Administered 2016-12-11: 1000 mg via INTRAVENOUS
  Filled 2016-12-11: qty 200

## 2016-12-11 MED ORDER — VANCOMYCIN HCL 10 G IV SOLR
1250.0000 mg | Freq: Two times a day (BID) | INTRAVENOUS | Status: DC
  Administered 2016-12-12: 1250 mg via INTRAVENOUS
  Filled 2016-12-11: qty 1250

## 2016-12-11 MED ORDER — SODIUM CHLORIDE 0.9 % IV SOLN
INTRAVENOUS | Status: DC
  Administered 2016-12-11 – 2016-12-18 (×5): via INTRAVENOUS

## 2016-12-11 MED ORDER — INSULIN GLARGINE 100 UNIT/ML ~~LOC~~ SOLN
15.0000 [IU] | Freq: Every day | SUBCUTANEOUS | Status: DC
  Administered 2016-12-12: 15 [IU] via SUBCUTANEOUS
  Filled 2016-12-11: qty 0.15

## 2016-12-11 MED ORDER — HYDROMORPHONE HCL 1 MG/ML IJ SOLN
1.0000 mg | INTRAMUSCULAR | Status: DC | PRN
  Administered 2016-12-11 – 2016-12-18 (×41): 1 mg via INTRAVENOUS
  Filled 2016-12-11 (×41): qty 1

## 2016-12-11 MED ORDER — VANCOMYCIN HCL 10 G IV SOLR
1500.0000 mg | Freq: Once | INTRAVENOUS | Status: AC
  Administered 2016-12-11: 1500 mg via INTRAVENOUS
  Filled 2016-12-11: qty 1500

## 2016-12-11 MED ORDER — ONDANSETRON HCL 4 MG/2ML IJ SOLN
4.0000 mg | Freq: Four times a day (QID) | INTRAMUSCULAR | Status: DC | PRN
  Administered 2016-12-13: 4 mg via INTRAVENOUS

## 2016-12-11 MED ORDER — MORPHINE SULFATE (PF) 2 MG/ML IV SOLN
4.0000 mg | Freq: Once | INTRAVENOUS | Status: DC
  Filled 2016-12-11: qty 2

## 2016-12-11 MED ORDER — PIPERACILLIN-TAZOBACTAM 3.375 G IVPB
3.3750 g | Freq: Three times a day (TID) | INTRAVENOUS | Status: DC
  Administered 2016-12-11 – 2016-12-14 (×8): 3.375 g via INTRAVENOUS
  Filled 2016-12-11 (×10): qty 50

## 2016-12-11 MED ORDER — INSULIN ASPART 100 UNIT/ML ~~LOC~~ SOLN
0.0000 [IU] | Freq: Three times a day (TID) | SUBCUTANEOUS | Status: DC
Start: 2016-12-12 — End: 2016-12-19
  Administered 2016-12-12 (×3): 3 [IU] via SUBCUTANEOUS
  Administered 2016-12-13: 5 [IU] via SUBCUTANEOUS
  Administered 2016-12-13: 3 [IU] via SUBCUTANEOUS
  Administered 2016-12-14: 5 [IU] via SUBCUTANEOUS
  Administered 2016-12-14 (×2): 3 [IU] via SUBCUTANEOUS
  Administered 2016-12-15 (×2): 5 [IU] via SUBCUTANEOUS
  Administered 2016-12-15 – 2016-12-16 (×2): 3 [IU] via SUBCUTANEOUS
  Administered 2016-12-16: 5 [IU] via SUBCUTANEOUS
  Administered 2016-12-17 (×3): 3 [IU] via SUBCUTANEOUS
  Administered 2016-12-18: 2 [IU] via SUBCUTANEOUS
  Administered 2016-12-18: 3 [IU] via SUBCUTANEOUS
  Administered 2016-12-18: 2 [IU] via SUBCUTANEOUS

## 2016-12-11 MED ORDER — ONDANSETRON HCL 4 MG/2ML IJ SOLN
4.0000 mg | Freq: Once | INTRAMUSCULAR | Status: AC
  Administered 2016-12-11: 4 mg via INTRAVENOUS
  Filled 2016-12-11: qty 2

## 2016-12-11 MED ORDER — HYDROMORPHONE HCL 1 MG/ML IJ SOLN
1.0000 mg | Freq: Once | INTRAMUSCULAR | Status: AC
  Administered 2016-12-11: 1 mg via INTRAVENOUS
  Filled 2016-12-11: qty 1

## 2016-12-11 MED ORDER — ENSURE ENLIVE PO LIQD
237.0000 mL | Freq: Two times a day (BID) | ORAL | Status: DC
  Administered 2016-12-14 – 2016-12-18 (×9): 237 mL via ORAL

## 2016-12-11 MED ORDER — BISACODYL 5 MG PO TBEC: 5.0000 mg | DELAYED_RELEASE_TABLET | Freq: Every day | ORAL | Status: DC | PRN

## 2016-12-11 MED ORDER — ONDANSETRON HCL 4 MG PO TABS: 4.0000 mg | ORAL_TABLET | Freq: Four times a day (QID) | ORAL | Status: DC | PRN

## 2016-12-11 MED ORDER — PIPERACILLIN-TAZOBACTAM 3.375 G IVPB 30 MIN
3.3750 g | Freq: Once | INTRAVENOUS | Status: AC
  Administered 2016-12-11: 3.375 g via INTRAVENOUS
  Filled 2016-12-11: qty 50

## 2016-12-11 MED ORDER — SODIUM CHLORIDE 0.9 % IV BOLUS (SEPSIS)
1000.0000 mL | Freq: Once | INTRAVENOUS | Status: AC
  Administered 2016-12-11: 1000 mL via INTRAVENOUS

## 2016-12-11 MED ORDER — POLYETHYLENE GLYCOL 3350 17 G PO PACK
17.0000 g | PACK | Freq: Every day | ORAL | Status: DC
  Administered 2016-12-11 – 2016-12-18 (×7): 17 g via ORAL
  Filled 2016-12-11 (×7): qty 1

## 2016-12-11 MED ORDER — ENOXAPARIN SODIUM 80 MG/0.8ML ~~LOC~~ SOLN
80.0000 mg | SUBCUTANEOUS | Status: DC
  Administered 2016-12-11: 21:00:00 80 mg via SUBCUTANEOUS
  Filled 2016-12-11: qty 0.8

## 2016-12-11 MED ORDER — PANTOPRAZOLE SODIUM 40 MG PO TBEC
80.0000 mg | DELAYED_RELEASE_TABLET | Freq: Every day | ORAL | Status: DC
  Administered 2016-12-12 – 2016-12-18 (×7): 80 mg via ORAL
  Filled 2016-12-11 (×7): qty 2

## 2016-12-11 NOTE — ED Triage Notes (Addendum)
Pt treated for infection of L 4th toe and completed antibiotic two weeks ago. Pt states L foot and toes have been worsening with pain and swelling. Two days ago L 4th toe turned black and patient reports little to no sensation in 4th toe, + sensation in other toes. Pt with difficulty ambulating. Pt dx with cancer of liver and supposed to have treatment today. Pt has port.

## 2016-12-11 NOTE — Progress Notes (Addendum)
Pharmacy Antibiotic Note  Chad [name] is a 46 y.o. male with colon cancer currently undergoing chemotherapy and diabetic foot with 4th toe ulcer recemtly completed a course of keflex and bactrim, presented to the ED on 12/11/2016 with c/o bleeding and drainage from wound site. To start broad abx with vancomycin and zosyn for cellulitis/wound infection.   Plan: - Zosyn 3.375 gm IV x1 over 30 min - Vancomycin 1000 mg IV x 1 per MD, give an additional 1500 mg IV x1 to get 2500 mg total for loading dose - will f/u with labs and enter maintenance doses for abx  ____________________________  Height: 6\' 3"  (190.5 cm) Weight: (!) 373 lb (169.2 kg) IBW/kg (Calculated) : 84.5  Temp (24hrs), Avg:97.8 F (36.6 C), Min:97.4 F (36.3 C), Max:98.2 F (36.8 C)   Recent Labs Lab 12/11/16 1000  WBC 11.7*    Estimated Creatinine Clearance: 137.5 mL/min (by C-G formula based on SCr of 1.1 mg/dL).    No Known Allergies   Thank you for allowing pharmacy to be a part of this patient's care.  Dia Sitter P 12/11/2016 2:10 PM

## 2016-12-11 NOTE — Patient Instructions (Signed)
You need to go to [facility] ER right now for emergent treatment of cellulitis, foot ulcer. You will likely need IV antibiotics and a surgical consult/evaluation to see if your toe needs to be amputated. You have a very serious infection that needs emergent treatment and for now appears confined to your foot but can spread to your entire body such as sepsis which is also a life threatening condition.    Cellulitis, Adult Cellulitis is a skin infection. The infected area is usually red and tender. This condition occurs most often in the arms and lower legs. The infection can travel to the muscles, blood, and underlying tissue and become serious. It is very important to get treated for this condition. What are the causes? Cellulitis is caused by bacteria. The bacteria enter through a break in the skin, such as a cut, burn, insect bite, open sore, or crack. What increases the risk? This condition is more likely to occur in people who:  Have a weak defense system (immune system).  Have open wounds on the skin such as cuts, burns, bites, and scrapes. Bacteria can enter the body through these open wounds.  Are older.  Have diabetes.  Have a type of long-lasting (chronic) liver disease (cirrhosis) or kidney disease.  Use IV drugs. What are the signs or symptoms? Symptoms of this condition include:  Redness, streaking, or spotting on the skin.  Swollen area of the skin.  Tenderness or pain when an area of the skin is touched.  Warm skin.  Fever.  Chills.  Blisters. How is this diagnosed? This condition is diagnosed based on a medical history and physical exam. You may also have tests, including:  Blood tests.  Lab tests.  Imaging tests. How is this treated? Treatment for this condition may include:  Medicines, such as antibiotic medicines or antihistamines.  Supportive care, such as rest and application of cold or warm cloths (cold or warm compresses) to the  skin.  Hospital care, if the condition is severe. The infection usually gets better within 1-2 days of treatment. Follow these instructions at home:  Take over-the-counter and prescription medicines only as told by your health care provider.  If you were prescribed an antibiotic medicine, take it as told by your health care provider. Do not stop taking the antibiotic even if you start to feel better.  Drink enough fluid to keep your urine clear or pale yellow.  Do not touch or rub the infected area.  Raise (elevate) the infected area above the level of your heart while you are sitting or lying down.  Apply warm or cold compresses to the affected area as told by your health care provider.  Keep all follow-up visits as told by your health care provider. This is important. These visits let your health care provider make sure a more serious infection is not developing. Contact a health care provider if:  You have a fever.  Your symptoms do not improve within 1-2 days of starting treatment.  Your bone or joint underneath the infected area becomes painful after the skin has healed.  Your infection returns in the same area or another area.  You notice a swollen bump in the infected area.  You develop new symptoms.  You have a general ill feeling (malaise) with muscle aches and pains. Get help right away if:  Your symptoms get worse.  You feel very sleepy.  You develop vomiting or diarrhea that persists.  You notice red streaks coming from  the infected area.  Your red area gets larger or turns dark in color. This information is not intended to replace advice given to you by your health care provider. Make sure you discuss any questions you have with your health care provider. Document Released: 04/10/2005 Document Revised: 11/09/2015 Document Reviewed: 05/10/2015 Elsevier Interactive Patient Education  2017 [employer].

## 2016-12-11 NOTE — ED Provider Notes (Signed)
[place] DEPT Provider Note   CSN: [id number] Arrival date & time: 12/11/16  1250     History   Chief Complaint Chief Complaint  Patient presents with  . L foot edema  . L 4th toe injury  . L 4th toe infection    HPI ADEEL GUIFFRE is a 46 y.o. male.  Patient c/o left 4th toe discoloration, and redness/swelling to foot, extending up to lower leg. Symptoms present x past few days. Acute onset, progressively worse. Had an infection of that toe a month ago but got better w po antibiotics - now looks much worse.  Decreased appetite, nausea, and malaise. No fever/chills. Denies injury to foot.    The history is provided by the patient.    Past Medical History:  Diagnosis Date  . Cancer [facility])    colon cancer  . Coronary artery disease   . Diabetes mellitus without complication ([place]) 12/7891   oral agents only (07/2015)  . ETOH abuse 2014  . Fatty liver 12/2014   on ultrasound. pt obese and abuses ETOH  . Gallstone 12/2014   GB stones and sludge on ultrasound and CT,  . GERD (gastroesophageal reflux disease)   . Hypertension   . Morbid obesity with BMI of 50.0-59.9, adult ([place]) 03/2013  . Rectal bleeding 05/2015   07/2015 colonoscopy with malignant appearing sigmoid mass. Additional rectal, descending, polyps  . Shoulder pain, bilateral    due to previous injury    Patient Active Problem List   Diagnosis Date Noted  . Goals of care, counseling/discussion 09/07/2016  . Port catheter in place 09/03/2016  . Family history of pancreatic cancer 12/21/2015  . 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 S/P colon resection 10/18/2015  . Cancer of left colon ([place])   . Gastroesophageal reflux disease without esophagitis 06/15/2015  . Chronic systolic HF (heart failure) ([place]) 04/06/2015  . Coronary artery calcification 04/06/2015  . Calculus of gallbladder without cholecystitis without obstruction 01/14/2015  . Hepatic steatosis 01/14/2015   . Neuropathy due to type 2 diabetes mellitus ([place]) 01/14/2015  . Polypharmacy 01/14/2015  . History of alcohol abuse 01/14/2015  . Type 2 diabetes mellitus with hyperglycemia ([place]) 01/06/2015  . Morbid obesity ([place]) 01/06/2015  . Essential hypertension, benign 01/06/2015  . Microalbuminuria due to type 2 diabetes mellitus ([place]) 11/13/2014    Past Surgical History:  Procedure Laterality Date  . CARDIAC CATHETERIZATION N/A 04/06/2015   Procedure: Left Heart Cath and Coronary Angiography;  Surgeon: Belva Crome, MD;  Location: [place] CV LAB;  Service: Cardiovascular;  Laterality: N/A;  . COLONOSCOPY N/A 07/25/2015   Procedure: COLONOSCOPY;  Surgeon: Gatha Mayer, MD;  Location: [facility] ENDOSCOPY;  Service: Gastroenterology;  Laterality: N/A;  . CYSTOSCOPY  10/18/2015   Procedure: CYSTOSCOPY FLEXIBLE URETHRAL DIALATION AND FOLEY PLACEMENT;  Surgeon: Alexis Frock, MD;  Location: [facility] ORS;  Service: Urology;;  . ESOPHAGOGASTRODUODENOSCOPY N/A 07/25/2015   Procedure: ESOPHAGOGASTRODUODENOSCOPY (EGD);  Surgeon: Gatha Mayer, MD;  Location: [facility];  Service: Gastroenterology;  Laterality: N/A;  . PORTACATH PLACEMENT Right 08/01/2016   Procedure: INSERTION PORT-A-CATH WITH ULTRASOUND AND FLUOROSOPIC GUIDANCE - RIGHT INTERNAL JUGULAR;  Surgeon: Michael Boston, MD;  Location: [place];  Service: General;  Laterality: Right;       Home Medications    Prior to Admission medications   Medication Sig Start Date End Date Taking? Authorizing Provider  acetaminophen (TYLENOL) 500 MG tablet Take 1,000 mg by mouth every 6 (six) hours as  needed for moderate pain or headache.   Yes [provider]  Ascorbic Acid (VITAMIN C) 1000 MG tablet Take 1,000 mg by mouth daily.   Yes [provider]  aspirin EC 81 MG tablet Take 1 tablet (81 mg total) by mouth daily. DO NOT TAKE AGAIN UNTIL 08/09/15 07/25/15  Yes Gatha Mayer, MD  carvedilol (COREG) 12.5 MG tablet Take 1 tablet (12.5 mg total)  by mouth 2 (two) times daily with a meal. 08/30/16  Yes Shawnee Knapp, MD  diphenoxylate-atropine (LOMOTIL) 2.5-0.025 MG tablet Take 1-2 tablets by mouth 4 (four) times daily as needed for diarrhea or loose stools. 12/04/16  Yes Truitt Merle, MD  gabapentin (NEURONTIN) 300 MG capsule Take 1 capsule (300 mg total) by mouth at bedtime. 04/03/16  Yes Shawnee Knapp, MD  Insulin Glargine [facility]) 100 UNIT/ML SOPN Inject up to 0.4 mL (40u) into skin nightly, titrate as advised by physician 10/16/16  Yes Shawnee Knapp, MD  lidocaine-prilocaine (EMLA) cream Apply 1 application topically as needed. 09/03/16  Yes Truitt Merle, MD  lisinopril-hydrochlorothiazide (PRINZIDE,ZESTORETIC) 20-25 MG tablet Take 1 tablet by mouth daily. 11/04/16  Yes Shawnee Knapp, MD  omeprazole (PRILOSEC) 40 MG capsule TAKE 1 CAPSULE(40 MG) BY MOUTH DAILY 11/04/16  Yes Shawnee Knapp, MD  ondansetron (ZOFRAN) 8 MG tablet Take 1 tablet (8 mg total) by mouth every 8 (eight) hours as needed for nausea or vomiting. 07/18/16  Yes Truitt Merle, MD  prochlorperazine (COMPAZINE) 10 MG tablet Take 1 tablet (10 mg total) by mouth every 6 (six) hours as needed for nausea or vomiting. 07/18/16  Yes Truitt Merle, MD  sitaGLIPtin-metformin (JANUMET) 50-1000 MG tablet Take 1 tablet by mouth 2 (two) times daily with a meal. 11/05/16  Yes Shawnee Knapp, MD  traZODone (DESYREL) 50 MG tablet Take 1/2 tab po qam and 1.5 tabs po qhs 08/12/16  Yes Shawnee Knapp, MD  blood glucose meter kit and supplies KIT Dispense based on patient and insurance preference. Use up to four times daily as directed. (FOR ICD-9 250.00, 250.01). 11/07/16   Harrison Mons, PA-C  glucose blood test strip One touch strips test bid Dx. dm 10/17/16   Shawnee Knapp, MD  Insulin Pen Needle (PEN NEEDLES) 32G X 4 MM MISC 1 Units by Does not apply route daily. 11/04/16   Shawnee Knapp, MD    Family History Family History  Problem Relation Age of Onset  . Diabetes Father   . Pancreatic cancer Father 41       former  smoker, but had not smoked in 6 years  . Cancer Maternal Uncle        d. 49s; unknown type of cancer -metastatic throughout abdomen   . Other Brother        oldest brother has had a normal colonoscopy  . Heart attack Maternal Grandfather        d. 57-58; heavy smoker and drinker  . Heart attack Paternal Grandmother        d. early 8s  . Heart attack Paternal Grandfather 10  . Heart failure Maternal Uncle   . Skin cancer Maternal Uncle 42       NOS type; was out in the sun a lot  . Brain cancer Maternal Uncle        NOS type; mother's maternal half-brother dx. 103-68  . Cirrhosis Paternal Uncle   . Cancer Paternal Uncle        (x2-3)  uncles with NOS cancers; d. 76s    Social History Social History  Substance Use Topics  . Smoking status: Never Smoker  . Smokeless tobacco: Current User    Types: Chew     Comment: occasionally dips tobacco - has used for approx 20 yrs (12/21/15)  . Alcohol use Yes     Comment: heavy drinking for 10 years, 2-3  drinks of liqour a night, has been cutting back.       Allergies   Patient has no known allergies.   Review of Systems Review of Systems  Constitutional: Negative for fever.  HENT: Negative for sore throat.   Eyes: Negative for redness.  Respiratory: Negative for shortness of breath.   Cardiovascular: Negative for chest pain.  Gastrointestinal: Negative for abdominal pain.  Genitourinary: Negative for flank pain.  Musculoskeletal: Negative for back pain.  Skin: Negative for rash.  Neurological: Negative for headaches.  Hematological: Does not bruise/bleed easily.  Psychiatric/Behavioral: Negative for confusion.     Physical Exam Updated Vital Signs BP 111/86 (BP Location: Left Wrist)   Pulse (!) 113   Temp 98.2 F (36.8 C) (Oral)   Resp 19   Ht 1.905 m (6' 3")   Wt (!) 169.2 kg (373 lb)   SpO2 98%   BMI 46.62 kg/m   Physical Exam  Constitutional: He appears well-developed and well-nourished. No distress.  HENT:    Mouth/Throat: Oropharynx is clear and moist.  Eyes: Conjunctivae are normal.  Neck: Neck supple. No tracheal deviation present.  Cardiovascular: Normal rate, regular rhythm, normal heart sounds and intact distal pulses.   Pulmonary/Chest: Effort normal and breath sounds normal. No accessory muscle usage. No respiratory distress.  Abdominal: He exhibits no distension. There is no tenderness.  Musculoskeletal:  Purplish discoloration left 4th toe, with cellulitis extending up foot to lower leg.  2-3 mm open wound on lateral aspect 4th toe (?from rubbing against 5th toe).    Neurological: He is alert.  Skin: Skin is warm and dry. He is not diaphoretic.  Psychiatric: He has a normal mood and affect.  Nursing note and vitals reviewed.    ED Treatments / Results  Labs (all labs ordered are listed, but only abnormal results are displayed)   Results for orders placed or performed during the hospital encounter of 12/11/16  Comprehensive metabolic panel  Result Value Ref Range   Sodium 130 (L) 135 - 145 mmol/L   Potassium 4.4 3.5 - 5.1 mmol/L   Chloride 97 (L) 101 - 111 mmol/L   CO2 23 22 - 32 mmol/L   Glucose, Bld 222 (H) 65 - 99 mg/dL   BUN 12 6 - 20 mg/dL   Creatinine, Ser 1.36 (H) 0.61 - 1.24 mg/dL   Calcium 8.9 8.9 - 10.3 mg/dL   Total Protein 7.6 6.5 - 8.1 g/dL   Albumin 3.4 (L) 3.5 - 5.0 g/dL   AST 18 15 - 41 U/L   ALT 20 17 - 63 U/L   Alkaline Phosphatase 80 38 - 126 U/L   Total Bilirubin 1.3 (H) 0.3 - 1.2 mg/dL   GFR calc non Af Amer >60 >60 mL/min   GFR calc Af Amer >60 >60 mL/min   Anion gap 10 5 - 15  CBC with Differential  Result Value Ref Range   WBC 12.0 (H) 4.0 - 10.5 K/uL   RBC 3.77 (L) 4.22 - 5.81 MIL/uL   Hemoglobin 12.1 (L) 13.0 - 17.0 g/dL   HCT 35.6 (L) 39.0 -  52.0 %   MCV 94.4 78.0 - 100.0 fL   MCH 32.1 26.0 - 34.0 pg   MCHC 34.0 30.0 - 36.0 g/dL   RDW 13.5 11.5 - 15.5 %   Platelets 465 (H) 150 - 400 K/uL   Neutrophils Relative % 54 %   Neutro Abs  6.5 1.7 - 7.7 K/uL   Lymphocytes Relative 27 %   Lymphs Abs 3.3 0.7 - 4.0 K/uL   Monocytes Relative 17 %   Monocytes Absolute 2.0 (H) 0.1 - 1.0 K/uL   Eosinophils Relative 2 %   Eosinophils Absolute 0.2 0.0 - 0.7 K/uL   Basophils Relative 0 %   Basophils Absolute 0.0 0.0 - 0.1 K/uL  I-Stat CG4 Lactic Acid, ED  Result Value Ref Range   Lactic Acid, Venous 1.50 0.5 - 1.9 mmol/L   Dg Toe 4th Left  Result Date: 12/11/2016 CLINICAL DATA:  Fourth toe pain and discomfort EXAM: LEFT FOURTH TOE COMPARISON:  11/05/2016 FINDINGS: Destructive changes are now seen distal aspect of the fourth proximal phalanx consistent with osteomyelitis. Soft tissue swelling of the fourth digit is noted. No other focal abnormality is seen. IMPRESSION: Erosive changes in the distal aspect of the fourth proximal phalanx consistent with osteomyelitis. Electronically Signed   By: Inez Catalina M.D.   On: 12/11/2016 14:49    EKG  EKG Interpretation None       Radiology Dg Toe 4th Left  Result Date: 12/11/2016 CLINICAL DATA:  Fourth toe pain and discomfort EXAM: LEFT FOURTH TOE COMPARISON:  11/05/2016 FINDINGS: Destructive changes are now seen distal aspect of the fourth proximal phalanx consistent with osteomyelitis. Soft tissue swelling of the fourth digit is noted. No other focal abnormality is seen. IMPRESSION: Erosive changes in the distal aspect of the fourth proximal phalanx consistent with osteomyelitis. Electronically Signed   By: Inez Catalina M.D.   On: 12/11/2016 14:49    Procedures Procedures (including critical care time)  Medications Ordered in ED Medications  piperacillin-tazobactam (ZOSYN) IVPB 3.375 g (not administered)  vancomycin (VANCOCIN) IVPB 1000 mg/200 mL premix (not administered)     Initial Impression / Assessment and Plan / ED Course  I have reviewed the triage vital signs and the nursing notes.  Pertinent labs & imaging results that were available during my care of the patient  were reviewed by me and considered in my medical decision making (see chart for details).  Iv ns. Cultures.   Labs.  Xrays.  vanc and zosyn iv.  Medical service consulted for admission.  Iv ns bolus.  Mild aki on labs. Possible osteo on xray - suspect given appearance toe, may require eventual toe amputation.    Medical service consulted for admission.    Final Clinical Impressions(s) / ED Diagnoses   Final diagnoses:  None    New Prescriptions New Prescriptions   No medications on file     Lajean Saver, MD 12/11/16 1541

## 2016-12-11 NOTE — Progress Notes (Signed)
MRN: [id number] DOB: Sep 28, 1970  Subjective:   Chad [name] is a 46 y.o. male presenting for chief complaint of Wound Check (Left foot 4th digit. Antibiotics sent to wrong pharmacy. Should go to [facility] on [address] ) and Medication Refill [facility])  Patient was last seen 11/04/2016, started on Keflex and Bactrim for a diabetic foot and 4th toe ulcer. Patient had some improvement in his foot ulcer following completion of the antibiotics. However, his symptoms have returned fully in the past week. Reports drainage, bleeding, tenderness with walking, foot swelling. Patient has been soaking his foot in Epsom salts. Denies fever, streaking, chest pain. Has n/v associated with his treatment of colon cancer with mets to his liver. He is currently receiving chemotherapy every 2 weeks.  Chad [name] has a current medication list which includes the following prescription(s): acetaminophen, vitamin c, aspirin ec, blood glucose meter kit and supplies, carvedilol, diphenoxylate-atropine, gabapentin, glucose blood, basaglar kwikpen, pen needles, lidocaine-prilocaine, lisinopril-hydrochlorothiazide, omeprazole, ondansetron, prochlorperazine, sitagliptin-metformin, trazodone, cephalexin, and sulfamethoxazole-trimethoprim, and the following Facility-Administered Medications: sodium chloride flush. Also has No Known Allergies. Chad [name]  has a past medical history of Cancer ([place]); Coronary artery disease; Diabetes mellitus without complication ([place]) (10/979); ETOH abuse (2014); Fatty liver (12/2014); Gallstone (12/2014); GERD (gastroesophageal reflux disease); Hypertension; Morbid obesity with BMI of 50.0-59.9, adult ([place]) (03/2013); Rectal bleeding (05/2015); and Shoulder pain, bilateral. Also  has a past surgical history that includes Cardiac catheterization (N/A, 04/06/2015); Colonoscopy (N/A, 07/25/2015); Esophagogastroduodenoscopy (N/A, 07/25/2015); Cystoscopy (10/18/2015); and Portacath placement (Right,  08/01/2016).  Objective:   Vitals: BP 128/89   Pulse (!) 130   Temp 97.4 F (36.3 C) (Oral)   Resp 20   Ht _0  (1.905 m)   Wt (!) 373 lb 3.2 oz (169.3 kg)   SpO2 100%   BMI 46.65 kg/m   Physical Exam  Constitutional: He is oriented to person, place, and time. He appears well-developed and well-nourished.  Cardiovascular: Normal rate.   Pulmonary/Chest: Effort normal.  Musculoskeletal:       Left foot: There is tenderness (mild over left 4th toe), swelling (of left 4th toe and dorsal aspect of left foot) and decreased capillary refill (of left 4th toe). There is normal range of motion, no bony tenderness, no crepitus, no deformity and no laceration.       Feet:  Neurological: He is alert and oriented to person, place, and time.    Results for orders placed or performed in visit on 12/11/16 (from the past 24 hour(s))  POCT CBC     Status: Abnormal   Collection Time: 12/11/16 10:00 AM  Result Value Ref Range   WBC 11.7 (A) 4.6 - 10.2 K/uL   Lymph, poc 3.6 (A) 0.6 - 3.4   POC LYMPH PERCENT 30.9 10 - 50 %L   MID (cbc) 1.1 (A) 0 - 0.9   POC MID % 9.3 0 - 12 %M   POC Granulocyte 7.0 (A) 2 - 6.9   Granulocyte percent 59.8 37 - 80 %G   RBC 4.09 (A) 4.69 - 6.13 M/uL   Hemoglobin 13.7 (A) 14.1 - 18.1 g/dL   HCT, POC 38.5 (A) 43.5 - 53.7 %   MCV 94.0 80 - 97 fL   MCH, POC 33.4 (A) 27 - 31.2 pg   MCHC 35.6 (A) 31.8 - 35.4 g/dL   RDW, POC 13.9 %   Platelet Count, POC 585 (A) 142 - 424 K/uL   MPV 6.4 0 - 99.8 fL    Assessment and Plan :  This case was precepted with Dr. Mitchel Honour.   1. Diabetic ulcer of toe of left foot associated with type 2 diabetes mellitus, unspecified ulcer stage ([place]) 2. Uncontrolled type 2 diabetes mellitus with complication, with long-term current use of insulin ([place]) 3. Tachycardia 4. Leukocytosis, unspecified type 5. Thrombocytosis ([place]) - Counseled patient extensively on risks of untreated serious infection including cellulitis, ulcers,  osteomyelitis and the possibility of becoming septic. Patient was advised to report to [facility] ER immediately for emergent treatment. He is reluctant to go due to his job but will consider seeking emergent treatment in the ER either today or tomorrow.  Jaynee Eagles, PA-C [facility] [phone] 12/11/2016  9:38 AM

## 2016-12-11 NOTE — ED Notes (Addendum)
WILL TRANSPORT PT TO 4E 1411-1. AAOX4. PT IN NO APPARENT DISTRESS WITH MILD PAIN. IVF INFUSING W/O PAIN OR SWELLING. THE OPPORTUNITY TO ASK QUESTIONS WAS PROVIDED.

## 2016-12-11 NOTE — Progress Notes (Signed)
Pharmacy Antibiotic Note  Chad [name] is a 45 y.o. male with colon cancer currently undergoing chemotherapy and diabetic foot with 4th toe ulcer recemtly completed a course of keflex and bactrim, presented to the ED on 12/11/2016 with c/o bleeding and drainage from wound site. To start broad abx with vancomycin and zosyn for cellulitis/wound infection.  scr 1.36, NormCrCl 68, wt 169 kg    Plan: - Zosyn 3.375 gm IV x1 over 30 min then 3.375 gm IV q8 hrs, infuse each dose over 4 hours - Vancomycin 1000 mg IV x 1 per MD, give an additional 1500 mg IV x1 to get 2500 mg total for loading dose then vancomycin 1250 mg IV q12h F/u renal fxn, wbc, temp, culture data Vancomycin levels as needed ____________________________  Height: 6\' 3"  (190.5 cm) Weight: (!) 373 lb (169.2 kg) IBW/kg (Calculated) : 84.5  Temp (24hrs), Avg:97.8 F (36.6 C), Min:97.4 F (36.3 C), Max:98.2 F (36.8 C)   Recent Labs Lab 12/11/16 1000 12/11/16 1412 12/11/16 1418  WBC 11.7* 12.0*  --   CREATININE  --  1.36*  --   LATICACIDVEN  --   --  1.50    Estimated Creatinine Clearance: 111.2 mL/min (A) (by C-G formula based on SCr of 1.36 mg/dL (H)).    No Known Allergies   Thank you for allowing pharmacy to be a part of this patient's care. Eudelia Bunch, Pharm.D. [phone] 12/11/2016 3:58 PM

## 2016-12-11 NOTE — H&P (Signed)
History and Physical    Chad [name] EQA:[id number] DOB: 1971-02-01 DOA: 12/11/2016  Referring MD/NP/PA: Lajean Saver PCP: Shawnee Knapp, MD  Outpatient Specialists:  Truitt Merle, Oncology Patient coming from: home  Chief Complaint: left foot pain and swelling, black toe  HPI: Chad [name] is a 46 y.o. male with history of metastatic colon cancer receiving chemotherapy, diabetes mellitus uncontrolled, hypertension, morbid obesity, who had an infected ulcer of the fourth toe of the left foot about a month ago that almost completely healed with antibiotics.  After he completed the antibiotics, however, he stopped dressing the ulcer on his left foot.  For the last week he has had increased swelling of the left foot with some redness and blackness of the fourth toe that started 3 days ago. The blackness extended into the forefoot and the redness extended up to the mid shin. He denied fevers, chills, vomiting aside from what he otherwise expected with his chemotherapy. He was encouraged by his primary care doctor to come to the emergency department due to concern of developing sepsis from his left foot cellulitis.  He has had sharp constant pain of the left toes 7 out of 10 that was better after Dilaudid in the emergency department.  ED Course: Damage or 99.7, heart rate in the 90s to low 100s, blood pressure 91/63, breathing comfortably on room air. His white blood cell count was 12 and platelets 465.  Glucose was 222 and last hemoglobin A1c 7.6 on 4/23.  X-ray of the left foot demonstrated erosive changes of the fourth toe suggestive of osteomyelitis. The patient was given vancomycin and Zosyn in the emergency department and admitted for further evaluation and treatment.  Review of Systems:  General:  Denies fevers, chills, weight loss or gain HEENT:  Denies changes to hearing and vision, rhinorrhea, sinus congestion, sore throat CV:  Denies chest pain and palpitations, lower extremity edema.    PULM:  Denies SOB, wheezing, cough.   GI:  Had some nausea and vomiting after his last round of chemotherapy. Denies constipation diarrhea GU:  Denies dysuria, frequency, urgency ENDO:  Denies polyuria, polydipsia.   HEME:  Denies hematemesis, blood in stools, melena, abnormal bruising or bleeding.  LYMPH:  Denies lymphadenopathy.   MSK:  Denies arthralgias, myalgias.    DERM:  Per history of present illness NEURO:  Denies focal numbness, weakness, slurred speech, confusion, facial droop.  PSYCH:  Denies anxiety and depression.    Past Medical History:  Diagnosis Date  . Cancer [facility])    colon cancer  . Coronary artery disease   . Diabetes mellitus without complication ([place]) 03/7988   oral agents only (07/2015)  . ETOH abuse 2014  . Fatty liver 12/2014   on ultrasound. pt obese and abuses ETOH  . Gallstone 12/2014   GB stones and sludge on ultrasound and CT,  . GERD (gastroesophageal reflux disease)   . Hypertension   . Morbid obesity with BMI of 50.0-59.9, adult ([place]) 03/2013  . Rectal bleeding 05/2015   07/2015 colonoscopy with malignant appearing sigmoid mass. Additional rectal, descending, polyps  . Shoulder pain, bilateral    due to previous injury    Past Surgical History:  Procedure Laterality Date  . CARDIAC CATHETERIZATION N/A 04/06/2015   Procedure: Left Heart Cath and Coronary Angiography;  Surgeon: Belva Crome, MD;  Location: [facility];  Service: Cardiovascular;  Laterality: N/A;  . COLONOSCOPY N/A 07/25/2015   Procedure: COLONOSCOPY;  Surgeon: Gatha Mayer,  MD;  Location: [facility];  Service: Gastroenterology;  Laterality: N/A;  . CYSTOSCOPY  10/18/2015   Procedure: CYSTOSCOPY FLEXIBLE URETHRAL DIALATION AND FOLEY PLACEMENT;  Surgeon: Alexis Frock, MD;  Location: [facility];  Service: Urology;;  . ESOPHAGOGASTRODUODENOSCOPY N/A 07/25/2015   Procedure: ESOPHAGOGASTRODUODENOSCOPY (EGD);  Surgeon: Gatha Mayer, MD;  Location: [facility];  Service:  Gastroenterology;  Laterality: N/A;  . PORTACATH PLACEMENT Right 08/01/2016   Procedure: INSERTION PORT-A-CATH WITH ULTRASOUND AND FLUOROSOPIC GUIDANCE - RIGHT INTERNAL JUGULAR;  Surgeon: Michael Boston, MD;  Location: [facility];  Service: General;  Laterality: Right;     reports that he has never smoked. His smokeless tobacco use includes Chew. He reports that he drinks alcohol. He reports that he does not use drugs.  No Known Allergies  Family History  Problem Relation Age of Onset  . Diabetes Father   . Pancreatic cancer Father 19       former smoker, but had not smoked in 82 years  . Cancer Maternal Uncle        d. 11s; unknown type of cancer -metastatic throughout abdomen   . Other Brother        oldest brother has had a normal colonoscopy  . Heart attack Maternal Grandfather        d. 57-58; heavy smoker and drinker  . Heart attack Paternal Grandmother        d. [name] 45s  . Heart attack Paternal Grandfather 79  . Heart failure Maternal Uncle   . Skin cancer Maternal Uncle 42       NOS type; was out in the sun a lot  . Brain cancer Maternal Uncle        NOS type; mother's maternal half-brother dx. 63-68  . Cirrhosis Paternal Uncle   . Cancer Paternal Uncle        (x2-3) uncles with NOS cancers; d. 48s    Prior to Admission medications   Medication Sig Start Date End Date Taking? Authorizing Provider  acetaminophen (TYLENOL) 500 MG tablet Take 1,000 mg by mouth every 6 (six) hours as needed for moderate pain or headache.   Yes [provider]  Ascorbic Acid (VITAMIN C) 1000 MG tablet Take 1,000 mg by mouth daily.   Yes [provider]  aspirin EC 81 MG tablet Take 1 tablet (81 mg total) by mouth daily. DO NOT TAKE AGAIN UNTIL 08/09/15 07/25/15  Yes Gatha Mayer, MD  carvedilol (COREG CR) 20 MG 24 hr capsule Take 20 mg by mouth daily.   Yes [provider]  diphenoxylate-atropine (LOMOTIL) 2.5-0.025 MG tablet Take 1-2 tablets by mouth 4 (four) times  daily as needed for diarrhea or loose stools. 12/04/16  Yes Truitt Merle, MD  Insulin Glargine (BASAGLAR KWIKPEN) 100 UNIT/ML SOPN Inject up to 0.4 mL (40u) into skin nightly, titrate as advised by physician Patient taking differently: Inject 15 Units into the skin daily. Inject up to 0.4 mL (40u) into skin nightly, titrate as advised by physician 10/16/16  Yes Shawnee Knapp, MD  lidocaine-prilocaine (EMLA) cream Apply 1 application topically as needed. 09/03/16  Yes Truitt Merle, MD  lisinopril-hydrochlorothiazide (PRINZIDE,ZESTORETIC) 20-25 MG tablet Take 1 tablet by mouth daily. 11/04/16  Yes Shawnee Knapp, MD  omeprazole (PRILOSEC) 40 MG capsule TAKE 1 CAPSULE(40 MG) BY MOUTH DAILY 11/04/16  Yes Shawnee Knapp, MD  ondansetron (ZOFRAN) 8 MG tablet Take 1 tablet (8 mg total) by mouth every 8 (eight) hours as needed for nausea or  vomiting. 07/18/16  Yes Truitt Merle, MD  prochlorperazine (COMPAZINE) 10 MG tablet Take 1 tablet (10 mg total) by mouth every 6 (six) hours as needed for nausea or vomiting. 07/18/16  Yes Truitt Merle, MD  sitaGLIPtin-metformin (JANUMET) 50-1000 MG tablet Take 1 tablet by mouth 2 (two) times daily with a meal. Patient taking differently: Take 2 tablets by mouth daily.  11/05/16  Yes Shawnee Knapp, MD  blood glucose meter kit and supplies KIT Dispense based on patient and insurance preference. Use up to four times daily as directed. (FOR ICD-9 250.00, 250.01). 11/07/16   Harrison Mons, PA-C  glucose blood test strip One touch strips test bid Dx. dm 10/17/16   Shawnee Knapp, MD  Insulin Pen Needle (PEN NEEDLES) 32G X 4 MM MISC 1 Units by Does not apply route daily. 11/04/16   Shawnee Knapp, MD    Physical Exam: Vitals:   12/11/16 1630 12/11/16 1645 12/11/16 1700 12/11/16 1804  BP: 91/63 102/63 109/73 123/86  Pulse:  95 98 (!) 101  Resp: 18 17 (!) 25 18  Temp:    99.7 F (37.6 C)  TempSrc:    Oral  SpO2:  90% 92% 96%  Weight:    (!) 169.2 kg (373 lb)  Height:    6' 3" (1.905 m)    Constitutional:  NAD, calm, comfortable Eyes: PERRL, lids and conjunctivae normal ENMT: Mucous membranes are moist. Posterior pharynx clear of any exudate or lesions.Normal dentition.  Neck: normal, supple, no masses, no thyromegaly Respiratory: clear to auscultation bilaterally, no wheezing, no crackles. Normal respiratory effort. No accessory muscle use.  Cardiovascular: Mildly tachycardic, no murmurs / rubs / gallops.  2+ pedal pulses. No carotid bruits.  Abdomen: no tenderness, no masses palpated. No hepatosplenomegaly. Bowel sounds positive.  Musculoskeletal: no clubbing / cyanosis. No joint deformity upper and lower extremities. Good ROM, no contractures. Normal muscle tone. 1+ pitting edema of the left foot and left calf Skin:  Ulceration on the lateral aspect of the fourth toe where the fifth toe has been rubbing into the toe. The fourth toe is completely black and the blackness extends about 1 cm proximal to the toe with some dark red erythema of the forefoot and streaking erythema up the anterior shin.  He has a 2+ pedal pulse on the left foot. Neurologic: CN 2-12 grossly intact. Sensation intact, DTR normal. Strength 5/5 in all 4.  Psychiatric: Normal judgment and insight. Alert and oriented x 3. Normal mood.   Labs on Admission: I have personally reviewed following labs and imaging studies  CBC:  Recent Labs Lab 12/11/16 1000 12/11/16 1412  WBC 11.7* 12.0*  NEUTROABS  --  6.5  HGB 13.7* 12.1*  HCT 38.5* 35.6*  MCV 94.0 94.4  PLT  --  341*   Basic Metabolic Panel:  Recent Labs Lab 12/11/16 1412  NA 130*  K 4.4  CL 97*  CO2 23  GLUCOSE 222*  BUN 12  CREATININE 1.36*  CALCIUM 8.9   GFR: Estimated Creatinine Clearance: 111.2 mL/min (A) (by C-G formula based on SCr of 1.36 mg/dL (H)). Liver Function Tests:  Recent Labs Lab 12/11/16 1412  AST 18  ALT 20  ALKPHOS 80  BILITOT 1.3*  PROT 7.6  ALBUMIN 3.4*   No results for input(s): LIPASE, AMYLASE in the last 168 hours. No  results for input(s): AMMONIA in the last 168 hours. Coagulation Profile: No results for input(s): INR, PROTIME in the last 168 hours. Cardiac Enzymes:  No results for input(s): CKTOTAL, CKMB, CKMBINDEX, TROPONINI in the last 168 hours. BNP (last 3 results) No results for input(s): PROBNP in the last 8760 hours. HbA1C: No results for input(s): HGBA1C in the last 72 hours. CBG: No results for input(s): GLUCAP in the last 168 hours. Lipid Profile: No results for input(s): CHOL, HDL, LDLCALC, TRIG, CHOLHDL, LDLDIRECT in the last 72 hours. Thyroid Function Tests: No results for input(s): TSH, T4TOTAL, FREET4, T3FREE, THYROIDAB in the last 72 hours. Anemia Panel: No results for input(s): VITAMINB12, FOLATE, FERRITIN, TIBC, IRON, RETICCTPCT in the last 72 hours. Urine analysis:    Component Value Date/Time   COLORURINE AMBER (A) 05/18/2015 1947   APPEARANCEUR CLEAR 05/18/2015 1947   LABSPEC 1.026 05/18/2015 1947   PHURINE 6.0 05/18/2015 1947   GLUCOSEU NEGATIVE 05/18/2015 1947   HGBUR NEGATIVE 05/18/2015 1947   BILIRUBINUR small (A) 07/13/2015 0908   BILIRUBINUR neg 01/06/2015 0926   KETONESUR negative 07/13/2015 0908   KETONESUR 15 (A) 05/18/2015 1947   PROTEINUR Negative 11/13/2016 1327   UROBILINOGEN 0.2 07/13/2015 0908   UROBILINOGEN 1.0 05/18/2015 1947   NITRITE Negative 07/13/2015 0908   NITRITE NEGATIVE 05/18/2015 1947   LEUKOCYTESUR Negative 07/13/2015 0908   Sepsis Labs: _0 (procalcitonin:4,lacticidven:4) )No results found for this or any previous visit (from the past 240 hour(s)).   Radiological Exams on Admission: Dg Toe 4th Left  Result Date: 12/11/2016 CLINICAL DATA:  Fourth toe pain and discomfort EXAM: LEFT FOURTH TOE COMPARISON:  11/05/2016 FINDINGS: Destructive changes are now seen distal aspect of the fourth proximal phalanx consistent with osteomyelitis. Soft tissue swelling of the fourth digit is noted. No other focal abnormality is seen. IMPRESSION:  Erosive changes in the distal aspect of the fourth proximal phalanx consistent with osteomyelitis. Electronically Signed   By: Inez Catalina M.D.   On: 12/11/2016 14:49    Assessment/Plan Active Problems:   Sepsis ([place])   Sepsis secondary to osteomyelitis and cellulitis of the left fourth toe and foot (tachycardia, hypotension, leukocytosis) -  MRI left foot to discern extent of infection -  Dr. Lorin Mercy will see in the morning -  F/u blood cultures -  Continue vancomycin and zosyn   Mild hyponatremia, elevated creatinine, and elevated bilirubin likely secondary to dehydration  -  IVF -  Repeat CMP in AM  Metastatic colon cancer -  Dr. Burr Medico added to rounding list -  Hold chemotherapy which otherwise would have been given today  Hypertension, blood pressures low normal -  Reduce carvedilol -  Hold lisinopril/HCTZ  CAD, chest pain free -  Hold aspirin tomorrow morning -  Continue reduced dose BB  Diabetes mellitus type 2 with A1c 7.6 on 11/04/2016, hyperglycemic -  Hold oral medications -  Moderate dose SSI -  Continue lantus 15 units daily  DVT prophylaxis: lovenox  Code Status: full Family Communication: patient and his brother who was present at bedside  Disposition Plan: likely home in 3 days  Consults called: Dr. Lorin Mercy, orthopedic surgery  Admission status:  Inpatient due to possibility of decompensation from sepsis in setting of metastatic cancer and chemotherapy.    Janece Canterbury MD [facility] Pager [phone]  If 7PM-7AM, please contact night-coverage [email or web address] Password [email or web address]  12/11/2016, 6:40 PM

## 2016-12-12 DIAGNOSIS — N135 Crossing vessel and stricture of ureter without hydronephrosis: Secondary | ICD-10-CM

## 2016-12-12 DIAGNOSIS — E11628 Type 2 diabetes mellitus with other skin complications: Secondary | ICD-10-CM

## 2016-12-12 DIAGNOSIS — C799 Secondary malignant neoplasm of unspecified site: Secondary | ICD-10-CM

## 2016-12-12 DIAGNOSIS — E11621 Type 2 diabetes mellitus with foot ulcer: Secondary | ICD-10-CM

## 2016-12-12 DIAGNOSIS — N1330 Unspecified hydronephrosis: Secondary | ICD-10-CM

## 2016-12-12 DIAGNOSIS — N179 Acute kidney failure, unspecified: Secondary | ICD-10-CM

## 2016-12-12 DIAGNOSIS — M869 Osteomyelitis, unspecified: Secondary | ICD-10-CM

## 2016-12-12 DIAGNOSIS — L089 Local infection of the skin and subcutaneous tissue, unspecified: Secondary | ICD-10-CM

## 2016-12-12 DIAGNOSIS — C189 Malignant neoplasm of colon, unspecified: Secondary | ICD-10-CM

## 2016-12-12 DIAGNOSIS — A419 Sepsis, unspecified organism: Principal | ICD-10-CM

## 2016-12-12 DIAGNOSIS — I2510 Atherosclerotic heart disease of native coronary artery without angina pectoris: Secondary | ICD-10-CM

## 2016-12-12 DIAGNOSIS — I96 Gangrene, not elsewhere classified: Secondary | ICD-10-CM

## 2016-12-12 DIAGNOSIS — L97529 Non-pressure chronic ulcer of other part of left foot with unspecified severity: Secondary | ICD-10-CM

## 2016-12-12 DIAGNOSIS — I10 Essential (primary) hypertension: Secondary | ICD-10-CM

## 2016-12-12 DIAGNOSIS — L03116 Cellulitis of left lower limb: Secondary | ICD-10-CM

## 2016-12-12 LAB — COMPREHENSIVE METABOLIC PANEL
ALT: 17 U/L (ref 17–63)
ANION GAP: 8 (ref 5–15)
AST: 14 U/L — ABNORMAL LOW (ref 15–41)
Albumin: 3 g/dL — ABNORMAL LOW (ref 3.5–5.0)
Alkaline Phosphatase: 72 U/L (ref 38–126)
BILIRUBIN TOTAL: 1.3 mg/dL — AB (ref 0.3–1.2)
BUN: 17 mg/dL (ref 6–20)
CO2: 26 mmol/L (ref 22–32)
Calcium: 8.3 mg/dL — ABNORMAL LOW (ref 8.9–10.3)
Chloride: 98 mmol/L — ABNORMAL LOW (ref 101–111)
Creatinine, Ser: 1.77 mg/dL — ABNORMAL HIGH (ref 0.61–1.24)
GFR calc Af Amer: 51 mL/min — ABNORMAL LOW (ref >60)
GFR, EST NON AFRICAN AMERICAN: 44 mL/min — AB (ref >60)
Glucose, Bld: 170 mg/dL — ABNORMAL HIGH (ref 65–99)
POTASSIUM: 4.5 mmol/L (ref 3.5–5.1)
Sodium: 132 mmol/L — ABNORMAL LOW (ref 135–145)
TOTAL PROTEIN: 6.7 g/dL (ref 6.5–8.1)

## 2016-12-12 LAB — CBC
HCT: 32.6 % — ABNORMAL LOW (ref 39.0–52.0)
Hemoglobin: 11.3 g/dL — ABNORMAL LOW (ref 13.0–17.0)
MCH: 32.7 pg (ref 26.0–34.0)
MCHC: 34.7 g/dL (ref 30.0–36.0)
MCV: 94.2 fL (ref 78.0–100.0)
PLATELETS: 331 10*3/uL (ref 150–400)
RBC: 3.46 MIL/uL — AB (ref 4.22–5.81)
RDW: 13.6 % (ref 11.5–15.5)
WBC: 10.2 10*3/uL (ref 4.0–10.5)

## 2016-12-12 LAB — GLUCOSE, CAPILLARY
GLUCOSE-CAPILLARY: 182 mg/dL — AB (ref 65–99)
GLUCOSE-CAPILLARY: 190 mg/dL — AB (ref 65–99)
Glucose-Capillary: 194 mg/dL — ABNORMAL HIGH (ref 65–99)
Glucose-Capillary: 165 mg/dL — ABNORMAL HIGH (ref 65–99)

## 2016-12-12 LAB — SEDIMENTATION RATE: SED RATE: 77 mm/h — AB (ref 0–16)

## 2016-12-12 LAB — C-REACTIVE PROTEIN: CRP: 18.4 mg/dL — AB (ref <1.0)

## 2016-12-12 MED ORDER — SODIUM CHLORIDE 0.9% FLUSH
10.0000 mL | INTRAVENOUS | Status: DC | PRN
  Administered 2016-12-12 – 2016-12-18 (×3): 10 mL
  Filled 2016-12-12 (×3): qty 40

## 2016-12-12 MED ORDER — INSULIN GLARGINE 100 UNIT/ML ~~LOC~~ SOLN
20.0000 [IU] | Freq: Every day | SUBCUTANEOUS | Status: DC
  Administered 2016-12-14: 20 [IU] via SUBCUTANEOUS
  Filled 2016-12-12 (×2): qty 0.2

## 2016-12-12 MED ORDER — VANCOMYCIN HCL 10 G IV SOLR
1750.0000 mg | INTRAVENOUS | Status: DC
  Administered 2016-12-12 – 2016-12-13 (×2): 1750 mg via INTRAVENOUS
  Filled 2016-12-12 (×3): qty 1750

## 2016-12-12 MED ORDER — PRO-STAT SUGAR FREE PO LIQD
30.0000 mL | Freq: Three times a day (TID) | ORAL | Status: DC
  Administered 2016-12-12 – 2016-12-18 (×13): 30 mL via ORAL
  Filled 2016-12-12 (×12): qty 30

## 2016-12-12 MED ORDER — INSULIN GLARGINE 100 UNIT/ML ~~LOC~~ SOLN: 18.0000 [IU] | Freq: Every day | SUBCUTANEOUS | Status: DC

## 2016-12-12 NOTE — Progress Notes (Signed)
Patient ID: Chad [name], male   DOB: August 07, 1970, 46 y.o.   MRN: [id number] Scheduled for toe amputation 4th toe , left Friday at about 5 PM. Likely VAC placement since closure may be a problem due to necrotic tissue proximal extension. Discussed with patient he agrees to proceed. Full consult to follow shortly.

## 2016-12-12 NOTE — Consult Note (Addendum)
Consult requested for foot wound.   X-ray results state: Destructive changes are now seen distal aspect of the fourth proximal phalanx consistent with osteomyelitis. Soft tissue swelling of the fourth digit is noted. This complex medical condition is beyond the [facility] scope of practice; please refer to ortho service for further plan of care.  Progress notes indicate that a consult from Dr Lorin Mercy is pending.  Please re-consult if further assistance is needed.  Thank-you,  Julien Girt MSN, [place], [place], [place], [place]

## 2016-12-12 NOTE — Progress Notes (Signed)
Initial Nutrition Assessment  DOCUMENTATION CODES:   Non-severe (moderate) malnutrition in context of acute illness/injury, Morbid obesity  INTERVENTION:   -Continue Ensure Enlive po BID, each supplement provides 350 kcal and 20 grams of protein -Provide Prostat liquid protein PO 30 ml TID with meals, each supplement provides 100 kcal, 15 grams protein. -Encourage PO intake -RD to continue to monitor  NUTRITION DIAGNOSIS:   Malnutrition (Moderate) related to acute illness, poor appetite, cancer and cancer related treatments as evidenced by percent weight loss, energy intake < 75% for > 7 days.  GOAL:   Patient will meet greater than or equal to 90% of their needs  MONITOR:   PO intake, Supplement acceptance, Labs, Weight trends, Skin, I & O's  REASON FOR ASSESSMENT:   Malnutrition Screening Tool    ASSESSMENT:   46 y.o. male with history of metastatic colon cancer receiving chemotherapy, diabetes mellitus uncontrolled, hypertension, morbid obesity, who had an infected ulcer of the fourth toe of the left foot about a month ago that almost completely healed with antibiotics.  After he completed the antibiotics, however, he stopped dressing the ulcer on his left foot.  For the last week he has had increased swelling of the left foot with some redness and blackness of the fourth toe that started 3 days ago. The blackness extended into the forefoot and the redness extended up to the mid shin. He denied fevers, chills, vomiting aside from what he otherwise expected with his chemotherapy. He was encouraged by his primary care doctor to come to the emergency department due to concern of developing sepsis from his left foot cellulitis.  He has had sharp constant pain of the left toes 7 out of 10 that was better after Dilaudid in the emergency department.  Pt reports poor appetite for a few weeks. He c/o bland tastes. Pt consumed a few bites of eggs and toast this morning for breakfast.  States he doesn't have a taste for anything. He is willing to try Ensure supplements, has not been drinking protein supplements at home. Pt is also willing to try Prostat.   Per chart review, pt has lost 19 lb since 5/2 (5% wt loss x 1 month, significant for time frame). Nutrition focused physical exam shows no sign of depletion of muscle mass or body fat.  Medications: Protonix tablet daily, Miralax packet daily Labs reviewed: Low Na GFR: 44 CBGs: 182-194  Diet Order:  Diet Carb Modified Fluid consistency: Thin; Room service appropriate? Yes  Skin:  Wound (see comment) (DM ulcer)  Last BM:  5/29  Height:   Ht Readings from Last 1 Encounters:  12/11/16 6\' 3"  (1.905 m)    Weight:   Wt Readings from Last 1 Encounters:  12/11/16 (!) 373 lb (169.2 kg)    Ideal Body Weight:  89.1 kg  BMI:  Body mass index is 46.62 kg/m.  Estimated Nutritional Needs:   Kcal:  2800-3000  Protein:  135-145g  Fluid:  2.8L/day  EDUCATION NEEDS:   No education needs identified at this time  Clayton Bibles, MS, RD, LDN Pager: [phone] After Hours Pager: [phone]

## 2016-12-12 NOTE — Progress Notes (Signed)
[facility] PROGRESS NOTE  Chad [name] GGY:[id number] DOB: July 13, 1971 DOA: 12/11/2016 PCP: Shawnee Knapp, MD  Interim summary and HPI 46 y.o. male with history of metastatic colon cancer receiving chemotherapy, diabetes mellitus uncontrolled, hypertension, morbid obesity, who had an infected ulcer of the fourth toe of the left foot about a month ago that almost completely healed with antibiotics.  After he completed the antibiotics, however, he stopped dressing the ulcer on his left foot.  For the last week he has had increased swelling of the left foot with some redness and blackness of the fourth toe that started 3 days ago. The blackness extended into the forefoot and the redness extended up to the mid shin. He denied fevers, chills, vomiting aside from what he otherwise expected with his chemotherapy. He was encouraged by his primary care doctor to come to the emergency department due to concern of developing sepsis from his left foot cellulitis.  He has had sharp constant pain of the left toes 7 out of 10 that was better after Dilaudid in the emergency department. Admitted with sepsis (tachycardic, elevated WBC's and x-ray findings suggesting osteomyelitis.  Assessment/Plan: 1-sepsis: due to left foot cellulitis and left fourth toe osteomyelitis  -currently afebrile and with improvement in WBC's HR after IVF's and antibiotics -will follow cx's -continue current antibiotics and supportive care -orthopedic service has seen patient and is planning toe amputation tomorrow (6/1) afternoon  -patient is hemodynamically stable currently -will hold lovenox in anticipation for surgery tomorrow. SCD's for DVT prophylaxis  2-hyponatremia and AKI -due to intravascular depletion and decrease perfusion in presence of sepsis -will continue IVF's -minimize use of nephrotoxic agents -pharmacy helping with medications adjustments for renal function   3-metastatic colon cancer -will continue  holding chemo for now -patient to follow up with oncology service at discharge  4-HTN -BP is stable overall -will continue reduced dose of carvedilol -will continue holding HCTZ and lisinopril  5-diabetes type 2 on chronic insulin -last A1C 7.6 -will hold oral hypoglycemic agents -will continue SSI and lantus  6-hx of CAD -no CP or SOB -holding ASA for surgical intervention  -continue B-blocker  7-GERD -will continue PPI  Code Status: Full code Family Communication: no family at bedside  Disposition Plan: remains inpatient. NP after liquid breakfast in am. Will stop lovenox. Planning toe amputation tomorrow afternoon. Will continue IV antibiotics.   Consultants:  Orthopedic service (Dr. Lorin Mercy)  Procedures:  Left 4th toe amputation and debridement planned for 6/1 afternoon.  Antibiotics:  Vancomycin and zosyn 5/30  HPI/Subjective: Afebrile, no CP and no SOB. Patient reports some left foot pain.  Objective: Vitals:   12/12/16 0608 12/12/16 1400  BP: 113/83 128/71  Pulse: (!) 103 94  Resp: (!) 21 18  Temp: 98.5 F (36.9 C) 99.2 F (37.3 C)    Intake/Output Summary (Last 24 hours) at 12/12/16 1607 Last data filed at 12/12/16 1500  Gross per 24 hour  Intake           2977.5 ml  Output              200 ml  Net           2777.5 ml   Filed Weights   12/11/16 1259 12/11/16 1804  Weight: (!) 169.2 kg (373 lb) (!) 169.2 kg (373 lb)    Exam:   General:  Obese, Afebrile and in no acute distress currently. Patient denies CP, SOB, nausea and vomiting.  Cardiovascular: mild tachycardia, no rubs,  no gallops   Respiratory: good air movement, no wheezing, no crackles   Abdomen: soft, NT, ND, positive BS  Musculoskeletal: bilateral trace edema, no clubbing; left 4th toe with black discoloration and surrounding erythema and swelling.   Data Reviewed: Basic Metabolic Panel:  Recent Labs Lab 12/11/16 1412 12/12/16 0317  NA 130* 132*  K 4.4 4.5  CL 97*  98*  CO2 23 26  GLUCOSE 222* 170*  BUN 12 17  CREATININE 1.36* 1.77*  CALCIUM 8.9 8.3*   Liver Function Tests:  Recent Labs Lab 12/11/16 1412 12/12/16 0317  AST 18 14*  ALT 20 17  ALKPHOS 80 72  BILITOT 1.3* 1.3*  PROT 7.6 6.7  ALBUMIN 3.4* 3.0*   CBC:  Recent Labs Lab 12/11/16 1000 12/11/16 1412 12/12/16 0317  WBC 11.7* 12.0* 10.2  NEUTROABS  --  6.5  --   HGB 13.7* 12.1* 11.3*  HCT 38.5* 35.6* 32.6*  MCV 94.0 94.4 94.2  PLT  --  465* 331   CBG:  Recent Labs Lab 12/11/16 2050 12/12/16 0744 12/12/16 1149  GLUCAP 175* 182* 194*    Recent Results (from the past 240 hour(s))  Blood culture (routine x 2)     Status: None (Preliminary result)   Collection Time: 12/11/16  2:12 PM  Result Value Ref Range Status   Specimen Description BLOOD RIGHT CHEST PORTA CATH  Final   Special Requests   Final    BOTTLES DRAWN AEROBIC AND ANAEROBIC Blood Culture adequate volume   Culture   Final    NO GROWTH < 24 HOURS Performed at [facility], [place] [address], [place], [place] [address]    Report Status PENDING  Incomplete  Blood culture (routine x 2)     Status: None (Preliminary result)   Collection Time: 12/11/16  3:35 PM  Result Value Ref Range Status   Specimen Description BLOOD LEFT HAND  Final   Special Requests   Final    BOTTLES DRAWN AEROBIC AND ANAEROBIC Blood Culture adequate volume   Culture   Final    NO GROWTH < 24 HOURS Performed at [facility], [place] [address], [place], [place] [address]    Report Status PENDING  Incomplete     Studies: Dg Toe 4th Left  Result Date: 12/11/2016 CLINICAL DATA:  Fourth toe pain and discomfort EXAM: LEFT FOURTH TOE COMPARISON:  11/05/2016 FINDINGS: Destructive changes are now seen distal aspect of the fourth proximal phalanx consistent with osteomyelitis. Soft tissue swelling of the fourth digit is noted. No other focal abnormality is seen. IMPRESSION: Erosive changes in the distal aspect of the fourth  proximal phalanx consistent with osteomyelitis. Electronically Signed   By: Inez Catalina M.D.   On: 12/11/2016 14:49    Scheduled Meds: . feeding supplement (ENSURE ENLIVE)  237 mL Oral BID BM  . feeding supplement (PRO-STAT SUGAR FREE 64)  30 mL Oral TID WC  . insulin aspart  0-15 Units Subcutaneous TID WC  . insulin aspart  0-5 Units Subcutaneous QHS  . [START ON 12/13/2016] insulin glargine  20 Units Subcutaneous Daily  . pantoprazole  80 mg Oral Daily  . polyethylene glycol  17 g Oral Daily   Continuous Infusions: . sodium chloride 75 mL/hr at 12/11/16 1926  . piperacillin-tazobactam (ZOSYN)  IV 3.375 g (12/12/16 1218)  . vancomycin       Time spent: 30 minutes    Barton Dubois  [facility] Pager [phone]. If 7PM-7AM, please contact  night-coverage at [email or web address], password [facility] 12/12/2016, 4:07 PM  LOS: 1 day

## 2016-12-12 NOTE — Progress Notes (Signed)
Pharmacy Antibiotic Note  Chad [name] is a 46 y.o. male with colon cancer currently undergoing chemotherapy and diabetic foot infection with 4th toe ulcer.  He recently completed a course of keflex and bactrim.  He presented to the ED on 12/11/2016 with c/o bleeding and drainage from wound site, swelling extending from toe to forefoot to mid shin.  Xray shows erosion c/w osteomyelitis; MRI pending. Pharmacy is consulted to dose vancomycin and zosyn for wound infection.   Afebrile WBC improved to WNL SCr increasing (1.36 > 1.77), NormCrCl 52  Plan:  Zosyn 3.375g IV Q8H infused over 4hrs.  Vancomycin decreased to 1750 mg IV q24h.  Measure Vanc trough at steady state.  Follow up renal fxn, culture results, and clinical course.  Follow up plan for toe amputation.  ____________________________  Height: 6\' 3"  (190.5 cm) Weight: (!) 373 lb (169.2 kg) IBW/kg (Calculated) : 84.5  Temp (24hrs), Avg:98.6 F (37 C), Min:97.4 F (36.3 C), Max:99.7 F (37.6 C)   Recent Labs Lab 12/11/16 1000 12/11/16 1412 12/11/16 1418 12/12/16 0317  WBC 11.7* 12.0*  --  10.2  CREATININE  --  1.36*  --  1.77*  LATICACIDVEN  --   --  1.50  --     Estimated Creatinine Clearance: 85.5 mL/min (A) (by C-G formula based on SCr of 1.77 mg/dL (H)).    No Known Allergies    Antimicrobials this admission:  5/30 vanc>> 5/30 zosyn>>  Dose adjustments this admission:  5/31 Vancomycin dose decreased for increasing SCr.  Microbiology results:  5/30 BCx:  Gretta Arab PharmD, BCPS Pager [phone] 12/12/2016 8:02 AM

## 2016-12-12 NOTE — Consult Note (Signed)
Reason for Consult: Right fourth toe osteomyelitis, diabetes with gangrene fourth toe Referring Physician: Dr. Rubie Maid is an 46 y.o. male.  HPI: 46 year old male with poorly controlled diabetes, hypertension, morbid obesity and metastatic colon cancer receiving chemotherapy and it infection of his fourth toe initially with an ulcer from rubbing against the adjacent toe. He was treated initially with antibiotics and got some improvement over. Time stop dressing changes on his toe last week developed increasing pain swelling and progressive blackness of the fourth toe that started 4 days ago. He then developed erythema extending up to the midfoot and was admitted for antibiotics after x-rays emergency room demonstrated osteomyelitis of the fourth toe. An MRI was pending however his body weight exceeded the maximum limit of the MRI table and test was unable to be performed. Patient has diabetic neuropathy but is complaining of toe pain receiving narcotic medication. Currently he is on vancomycin and Zosyn.  Past Medical History:  Diagnosis Date  . Cancer [facility])    colon cancer  . Coronary artery disease   . Diabetes mellitus without complication ([place]) 12/5788   oral agents only (07/2015)  . ETOH abuse 2014  . Fatty liver 12/2014   on ultrasound. pt obese and abuses ETOH  . Gallstone 12/2014   GB stones and sludge on ultrasound and CT,  . GERD (gastroesophageal reflux disease)   . Hypertension   . Morbid obesity with BMI of 50.0-59.9, adult ([place]) 03/2013  . Rectal bleeding 05/2015   07/2015 colonoscopy with malignant appearing sigmoid mass. Additional rectal, descending, polyps  . Shoulder pain, bilateral    due to previous injury    Past Surgical History:  Procedure Laterality Date  . CARDIAC CATHETERIZATION N/A 04/06/2015   Procedure: Left Heart Cath and Coronary Angiography;  Surgeon: Belva Crome, MD;  Location: [place] [facility];  Service: Cardiovascular;  Laterality:  N/A;  . COLONOSCOPY N/A 07/25/2015   Procedure: COLONOSCOPY;  Surgeon: Gatha Mayer, MD;  Location: [facility];  Service: Gastroenterology;  Laterality: N/A;  . CYSTOSCOPY  10/18/2015   Procedure: CYSTOSCOPY FLEXIBLE URETHRAL DIALATION AND FOLEY PLACEMENT;  Surgeon: Alexis Frock, MD;  Location: [facility] OR[facility];  Service: Urology;;  . ESOPHAGOGASTRODUODENOSCOPY N/A 07/25/2015   Procedure: ESOPHAGOGASTRODUODENOSCOPY (EGD);  Surgeon: Gatha Mayer, MD;  Location: [facility];  Service: Gastroenterology;  Laterality: N/A;  . PORTACATH PLACEMENT Right 08/01/2016   Procedure: INSERTION PORT-A-CATH WITH ULTRASOUND AND FLUOROSOPIC GUIDANCE - RIGHT INTERNAL JUGULAR;  Surgeon: Michael Boston, MD;  Location: [place];  Service: General;  Laterality: Right;    Family History  Problem Relation Age of Onset  . Diabetes Father   . Pancreatic cancer Father 13       former smoker, but had not smoked in 79 years  . Cancer Maternal Uncle        d. 69s; unknown type of cancer -metastatic throughout abdomen   . Other Brother        oldest brother has had a normal colonoscopy  . Heart attack Maternal Grandfather        d. 57-58; heavy smoker and drinker  . Heart attack Paternal Grandmother        d. early 62s  . Heart attack Paternal Grandfather 20  . Heart failure Maternal Uncle   . Skin cancer Maternal Uncle 42       NOS type; was out in the sun a lot  . Brain cancer Maternal Uncle  NOS type; mother's maternal half-brother dx. 67-68  . Cirrhosis Paternal Uncle   . Cancer Paternal Uncle        (x2-3) uncles with NOS cancers; d. 44s    Social History:  reports that he has never smoked. His smokeless tobacco use includes Chew. He reports that he drinks alcohol. He reports that he does not use drugs.  Allergies: No Known Allergies  Medications: I have reviewed the patient's current medications.  Results for orders placed or performed during the hospital encounter of 12/11/16 (from the past 48 hour(s))    Comprehensive metabolic panel     Status: Abnormal   Collection Time: 12/11/16  2:12 PM  Result Value Ref Range   Sodium 130 (L) 135 - 145 mmol/L   Potassium 4.4 3.5 - 5.1 mmol/L   Chloride 97 (L) 101 - 111 mmol/L   CO2 23 22 - 32 mmol/L   Glucose, Bld 222 (H) 65 - 99 mg/dL   BUN 12 6 - 20 mg/dL   Creatinine, Ser 1.36 (H) 0.61 - 1.24 mg/dL   Calcium 8.9 8.9 - 10.3 mg/dL   Total Protein 7.6 6.5 - 8.1 g/dL   Albumin 3.4 (L) 3.5 - 5.0 g/dL   AST 18 15 - 41 U/L   ALT 20 17 - 63 U/L   Alkaline Phosphatase 80 38 - 126 U/L   Total Bilirubin 1.3 (H) 0.3 - 1.2 mg/dL   GFR calc non Af Amer >60 >60 mL/min   GFR calc Af Amer >60 >60 mL/min    Comment: (NOTE) The eGFR has been calculated using the CKD EPI equation. This calculation has not been validated in all clinical situations. eGFR's persistently <60 mL/min signify possible Chronic Kidney Disease.    Anion gap 10 5 - 15  CBC with Differential     Status: Abnormal   Collection Time: 12/11/16  2:12 PM  Result Value Ref Range   WBC 12.0 (H) 4.0 - 10.5 K/uL   RBC 3.77 (L) 4.22 - 5.81 MIL/uL   Hemoglobin 12.1 (L) 13.0 - 17.0 g/dL   HCT 35.6 (L) 39.0 - 52.0 %   MCV 94.4 78.0 - 100.0 fL   MCH 32.1 26.0 - 34.0 pg   MCHC 34.0 30.0 - 36.0 g/dL   RDW 13.5 11.5 - 15.5 %   Platelets 465 (H) 150 - 400 K/uL   Neutrophils Relative % 54 %   Neutro Abs 6.5 1.7 - 7.7 K/uL   Lymphocytes Relative 27 %   Lymphs Abs 3.3 0.7 - 4.0 K/uL   Monocytes Relative 17 %   Monocytes Absolute 2.0 (H) 0.1 - 1.0 K/uL   Eosinophils Relative 2 %   Eosinophils Absolute 0.2 0.0 - 0.7 K/uL   Basophils Relative 0 %   Basophils Absolute 0.0 0.0 - 0.1 K/uL  Blood culture (routine x 2)     Status: None (Preliminary result)   Collection Time: 12/11/16  2:12 PM  Result Value Ref Range   Specimen Description BLOOD RIGHT CHEST PORTA CATH    Special Requests      BOTTLES DRAWN AEROBIC AND ANAEROBIC Blood Culture adequate volume   Culture      NO GROWTH < 24  HOURS Performed at [facility], [address] [place], [place] [address]    Report Status PENDING   I-Stat CG4 Lactic Acid, ED     Status: None   Collection Time: 12/11/16  2:18 PM  Result Value Ref Range   Lactic  Acid, Venous 1.50 0.5 - 1.9 mmol/L  Blood culture (routine x 2)     Status: None (Preliminary result)   Collection Time: 12/11/16  3:35 PM  Result Value Ref Range   Specimen Description BLOOD LEFT HAND    Special Requests      BOTTLES DRAWN AEROBIC AND ANAEROBIC Blood Culture adequate volume   Culture      NO GROWTH < 24 HOURS Performed at [facility] [address], [place], [place] [address]    Report Status PENDING   Glucose, capillary     Status: Abnormal   Collection Time: 12/11/16  8:50 PM  Result Value Ref Range   Glucose-Capillary 175 (H) 65 - 99 mg/dL  Urinalysis, Routine w reflex microscopic     Status: Abnormal   Collection Time: 12/11/16  9:15 PM  Result Value Ref Range   Color, Urine AMBER (A) YELLOW    Comment: BIOCHEMICALS MAY BE AFFECTED BY COLOR   APPearance HAZY (A) CLEAR   Specific Gravity, Urine 1.020 1.005 - 1.030   pH 5.0 5.0 - 8.0   Glucose, UA NEGATIVE NEGATIVE mg/dL   Hgb urine dipstick NEGATIVE NEGATIVE   Bilirubin Urine NEGATIVE NEGATIVE   Ketones, ur NEGATIVE NEGATIVE mg/dL   Protein, ur 30 (A) NEGATIVE mg/dL   Nitrite NEGATIVE NEGATIVE   Leukocytes, UA NEGATIVE NEGATIVE   RBC / HPF 0-5 0 - 5 RBC/hpf   WBC, UA 6-30 0 - 5 WBC/hpf   Bacteria, UA RARE (A) NONE SEEN   Squamous Epithelial / LPF 0-5 (A) NONE SEEN   Mucous PRESENT    Hyaline Casts, UA PRESENT    Amorphous Crystal PRESENT   CBC     Status: Abnormal   Collection Time: 12/12/16  3:17 AM  Result Value Ref Range   WBC 10.2 4.0 - 10.5 K/uL   RBC 3.46 (L) 4.22 - 5.81 MIL/uL   Hemoglobin 11.3 (L) 13.0 - 17.0 g/dL   HCT 32.6 (L) 39.0 - 52.0 %   MCV 94.2 78.0 - 100.0 fL   MCH 32.7 26.0 - 34.0 pg   MCHC 34.7 30.0 - 36.0 g/dL   RDW 13.6 11.5 - 15.5 %    Platelets 331 150 - 400 K/uL  Sedimentation rate     Status: Abnormal   Collection Time: 12/12/16  3:17 AM  Result Value Ref Range   Sed Rate 77 (H) 0 - 16 mm/hr  C-reactive protein     Status: Abnormal   Collection Time: 12/12/16  3:17 AM  Result Value Ref Range   CRP 18.4 (H) <1.0 mg/dL    Comment: Performed at [facility], [address] [place], [place] [address]  Comprehensive metabolic panel     Status: Abnormal   Collection Time: 12/12/16  3:17 AM  Result Value Ref Range   Sodium 132 (L) 135 - 145 mmol/L   Potassium 4.5 3.5 - 5.1 mmol/L   Chloride 98 (L) 101 - 111 mmol/L   CO2 26 22 - 32 mmol/L   Glucose, Bld 170 (H) 65 - 99 mg/dL   BUN 17 6 - 20 mg/dL   Creatinine, Ser 1.77 (H) 0.61 - 1.24 mg/dL   Calcium 8.3 (L) 8.9 - 10.3 mg/dL   Total Protein 6.7 6.5 - 8.1 g/dL   Albumin 3.0 (L) 3.5 - 5.0 g/dL   AST 14 (L) 15 - 41 U/L   ALT 17 17 - 63 U/L   Alkaline Phosphatase 72 38 -  126 U/L   Total Bilirubin 1.3 (H) 0.3 - 1.2 mg/dL   GFR calc non Af Amer 44 (L) >60 mL/min   GFR calc Af Amer 51 (L) >60 mL/min    Comment: (NOTE) The eGFR has been calculated using the CKD EPI equation. This calculation has not been validated in all clinical situations. eGFR's persistently <60 mL/min signify possible Chronic Kidney Disease.    Anion gap 8 5 - 15  Glucose, capillary     Status: Abnormal   Collection Time: 12/12/16  7:44 AM  Result Value Ref Range   Glucose-Capillary 182 (H) 65 - 99 mg/dL  Glucose, capillary     Status: Abnormal   Collection Time: 12/12/16 11:49 AM  Result Value Ref Range   Glucose-Capillary 194 (H) 65 - 99 mg/dL  Glucose, capillary     Status: Abnormal   Collection Time: 12/12/16  4:30 PM  Result Value Ref Range   Glucose-Capillary 165 (H) 65 - 99 mg/dL  Glucose, capillary     Status: Abnormal   Collection Time: 12/12/16  8:34 PM  Result Value Ref Range   Glucose-Capillary 190 (H) 65 - 99 mg/dL    Dg Toe 4th Left  Result Date:  12/11/2016 CLINICAL DATA:  Fourth toe pain and discomfort EXAM: LEFT FOURTH TOE COMPARISON:  11/05/2016 FINDINGS: Destructive changes are now seen distal aspect of the fourth proximal phalanx consistent with osteomyelitis. Soft tissue swelling of the fourth digit is noted. No other focal abnormality is seen. IMPRESSION: Erosive changes in the distal aspect of the fourth proximal phalanx consistent with osteomyelitis. Electronically Signed   By: Inez Catalina M.D.   On: 12/11/2016 14:49    ROS patient denies the fever chills no recent weight gain although his BMI is elevated with obesity. He has had hyperglycemia poorly controlled diabetes. Previous cardiac catheterization September 2016. Colonoscopy 07/25/2015. Positive for colon cancer on chemotherapy. Port-A-Cath placement 08/01/2016. Patient has never smoked. History of gallstones, fatty liver, EtOH abuse, coronary artery disease. Blood pressure 134/73, pulse (!) 104, temperature 99.2 F (37.3 C), temperature source Oral, resp. rate 19, height '6\' 3"'  (1.905 m), weight (!) 373 lb (169.2 kg), SpO2 96 %. Physical Exam  Constitutional: He is oriented to person, place, and time. He appears well-developed and well-nourished.  HENT:  Head: Normocephalic and atraumatic.  Eyes: Pupils are equal, round, and reactive to light.  Neck: Neck supple. No thyromegaly present.  Cardiovascular: Normal rate and regular rhythm.   Respiratory: Effort normal.  GI: Soft.  Neurological: He is alert and oriented to person, place, and time.  Skin:  Left fourth toe necrosis. This extends to the MCP joint level. Slight pinkness on the plantar surface just past the MCP joint for about 1 cm. we refilled the toe. Slight erythema from resolving cellulitis dorsally over the metatarsals third fourth and fifth. He has palpable pulses dorsalis pedis posterior tib both right and left. Dorsal plantar foot has brisk capillary refill.    Assessment/Plan: Left fourth toe osteomyelitis  with gangrene of the toe. We'll proceed with left fourth toe amputation and likely VAC placement if there is not adequate tissue for closure. If his metatarsal phalangeal joint is involved then he may have to have the fourth metatarsal head also debrided. Planned procedure discussed with patient risk surgery discussed questions was answered he understands request we proceed.  Marybelle Killings 12/12/2016, 11:31 PM

## 2016-12-12 NOTE — Progress Notes (Signed)
MRI called stating that due to patients weight he would not be able to get the MRI. He would need to go to [facility] to get the MRI. Vivien Presto notified.

## 2016-12-12 NOTE — Care Management Note (Signed)
Case Management Note  Patient Details  Name: Chad [name] MRN: [id number] Date of Birth: 16-Jun-1971  Subjective/Objective:46 y/o m admitted w/Sepsis. L foot pain, black toe. From home. Woc-defer to ortho sx,ortho sx following. Transfer to   [facility] for MRI.                  Action/Plan:d/c plan home.   Expected Discharge Date:   (unknown)               Expected Discharge Plan:  Home/Self Care  In-House Referral:     Discharge planning Services  CM Consult  Post Acute Care Choice:    Choice offered to:     DME Arranged:    DME Agency:     HH Arranged:    HH Agency:     Status of Service:  In process, will continue to follow  If discussed at Long Length of Stay Meetings, dates discussed:    Additional Comments:  Dessa Phi, RN 12/12/2016, 11:27 AM

## 2016-12-12 NOTE — Progress Notes (Signed)
Chad [name]   DOB:July 23, 1970   NL#:[id number]   XTK#:[id number]  ONCOLOGY FOLLOW UP  Subjective: Pt is well-known to me, under my care for his metastatic colon cancer, on palliative chemo FOLFIRI and avastin, last dose on 5/16. He was admitted for left diabetic foot ulcers and cellulitis.    Objective:  Vitals:   12/12/16 1400 12/12/16 2042  BP: 128/71 103/80  Pulse: 94 88  Resp: 18 18  Temp: 99.2 F (37.3 C) 99.1 F (37.3 C)    Body mass index is 46.62 kg/m.  Intake/Output Summary (Last 24 hours) at 12/12/16 2118 Last data filed at 12/12/16 1847  Gross per 24 hour  Intake          2751.25 ml  Output                0 ml  Net          2751.25 ml     Sclerae unicteric  Oropharynx clear  No peripheral adenopathy  Lungs clear -- no rales or rhonchi  Heart regular rate and rhythm  Abdomen benign  MSK no focal spinal tenderness, no peripheral edema  LE: left foot edema with skin Erythema, toes are wrapped  Neuro nonfocal   CBG (last 3)   Recent Labs  12/12/16 1149 12/12/16 1630 12/12/16 2034  GLUCAP 194* 165* 190*     Labs:  Lab Results  Component Value Date   WBC 10.2 12/12/2016   HGB 11.3 (L) 12/12/2016   HCT 32.6 (L) 12/12/2016   MCV 94.2 12/12/2016   PLT 331 12/12/2016   NEUTROABS 6.5 12/11/2016    Urine Studies No results for input(s): UHGB, CRYS in the last 72 hours.  Invalid input(s): UACOL, UAPR, USPG, UPH, UTP, UGL, UKET, UBIL, UNIT, UROB, ULEU, UEPI, UWBC, URBC, UBAC, CAST, UCOM, [place]  Basic Metabolic Panel:  Recent Labs Lab 12/11/16 1412 12/12/16 0317  NA 130* 132*  K 4.4 4.5  CL 97* 98*  CO2 23 26  GLUCOSE 222* 170*  BUN 12 17  CREATININE 1.36* 1.77*  CALCIUM 8.9 8.3*   GFR Estimated Creatinine Clearance: 85.5 mL/min (A) (by C-G formula based on SCr of 1.77 mg/dL (H)). Liver Function Tests:  Recent Labs Lab 12/11/16 1412 12/12/16 0317  AST 18 14*  ALT 20 17  ALKPHOS 80 72  BILITOT 1.3* 1.3*  PROT 7.6 6.7  ALBUMIN  3.4* 3.0*   No results for input(s): LIPASE, AMYLASE in the last 168 hours. No results for input(s): AMMONIA in the last 168 hours. Coagulation profile No results for input(s): INR, PROTIME in the last 168 hours.  CBC:  Recent Labs Lab 12/11/16 1000 12/11/16 1412 12/12/16 0317  WBC 11.7* 12.0* 10.2  NEUTROABS  --  6.5  --   HGB 13.7* 12.1* 11.3*  HCT 38.5* 35.6* 32.6*  MCV 94.0 94.4 94.2  PLT  --  465* 331   Cardiac Enzymes: No results for input(s): CKTOTAL, CKMB, CKMBINDEX, TROPONINI in the last 168 hours. BNP: Invalid input(s): POCBNP CBG:  Recent Labs Lab 12/11/16 2050 12/12/16 0744 12/12/16 1149 12/12/16 1630 12/12/16 2034  GLUCAP 175* 182* 194* 165* 190*   D-Dimer No results for input(s): DDIMER in the last 72 hours. Hgb A1c No results for input(s): HGBA1C in the last 72 hours. Lipid Profile No results for input(s): CHOL, HDL, LDLCALC, TRIG, CHOLHDL, LDLDIRECT in the last 72 hours. Thyroid function studies No results for input(s): TSH, T4TOTAL, T3FREE, THYROIDAB in the last 72 hours.  Invalid  input(s): FREET3 Anemia work up No results for input(s): VITAMINB12, FOLATE, FERRITIN, TIBC, IRON, RETICCTPCT in the last 72 hours. Microbiology Recent Results (from the past 240 hour(s))  Blood culture (routine x 2)     Status: None (Preliminary result)   Collection Time: 12/11/16  2:12 PM  Result Value Ref Range Status   Specimen Description BLOOD RIGHT CHEST PORTA CATH  Final   Special Requests   Final    BOTTLES DRAWN AEROBIC AND ANAEROBIC Blood Culture adequate volume   Culture   Final    NO GROWTH < 24 HOURS Performed at [facility], [address], [place], [place] [address]    Report Status PENDING  Incomplete  Blood culture (routine x 2)     Status: None (Preliminary result)   Collection Time: 12/11/16  3:35 PM  Result Value Ref Range Status   Specimen Description BLOOD LEFT HAND  Final   Special Requests   Final    BOTTLES DRAWN AEROBIC AND  ANAEROBIC Blood Culture adequate volume   Culture   Final    NO GROWTH < 24 HOURS Performed at [facility], [address] [place], [place] [address]    Report Status PENDING  Incomplete      Studies:  Dg Toe 4th Left  Result Date: 12/11/2016 CLINICAL DATA:  Fourth toe pain and discomfort EXAM: LEFT FOURTH TOE COMPARISON:  11/05/2016 FINDINGS: Destructive changes are now seen distal aspect of the fourth proximal phalanx consistent with osteomyelitis. Soft tissue swelling of the fourth digit is noted. No other focal abnormality is seen. IMPRESSION: Erosive changes in the distal aspect of the fourth proximal phalanx consistent with osteomyelitis. Electronically Signed   By: Inez Catalina M.D.   On: 12/11/2016 14:49    Assessment: 46 y.o. with PMH of DM, HTN, morbid obesity, and metastatic colon cancer on chemo, admitted for left foot cellulitis   1. Left foot cellulitis and left fourth toe osteomyelitis 2. Metastatic colon cancer, on palliative chemo, last treatment on 5/16 3. HTN 4. DM 5. Right ureter obstruction and hydronephrosis 6. CAD 7. Morbid obesity  Plan:  -he is on broad antibiotics, and plan to have left 4th toe amputation by Dr. Lorin Mercy this Friday  -His blood count has recovered well from chemotherapy, should be adequate for surgery. However he has been on Avastin, last dose 2 weeks ago, which will slow down his wound healing. Typically surgical is not recommend within one month of Avastin therapy, however if his 4th toe amputation is urgent and necessary to control his foot infection, I will agree with surgery tomorrow.  -I will hold his chemo until his wound heals. Hopefully he can restart chemo in July  -pt will call me to schedule his f/u appointment with [place] when he is ready to be discharged from hospital.    Truitt Merle, MD 12/12/2016  9:18 PM

## 2016-12-13 ENCOUNTER — Inpatient Hospital Stay (HOSPITAL_COMMUNITY): Payer: BLUE CROSS/BLUE SHIELD | Admitting: Certified Registered Nurse Anesthetist

## 2016-12-13 ENCOUNTER — Encounter (HOSPITAL_COMMUNITY)
Admission: EM | Disposition: A | Discharge status: Home Health | Payer: Self-pay | Source: Home / Self Care | Attending: Internal Medicine

## 2016-12-13 DIAGNOSIS — M86172 Other acute osteomyelitis, left ankle and foot: Secondary | ICD-10-CM

## 2016-12-13 DIAGNOSIS — I96 Gangrene, not elsewhere classified: Secondary | ICD-10-CM

## 2016-12-13 DIAGNOSIS — L089 Local infection of the skin and subcutaneous tissue, unspecified: Secondary | ICD-10-CM

## 2016-12-13 HISTORY — PX: AMPUTATION: SHX166

## 2016-12-13 LAB — GLUCOSE, CAPILLARY
GLUCOSE-CAPILLARY: 152 mg/dL — AB (ref 65–99)
Glucose-Capillary: 203 mg/dL — ABNORMAL HIGH (ref 65–99)
Glucose-Capillary: 187 mg/dL — ABNORMAL HIGH (ref 65–99)
Glucose-Capillary: 150 mg/dL — ABNORMAL HIGH (ref 65–99)
Glucose-Capillary: 143 mg/dL — ABNORMAL HIGH (ref 65–99)

## 2016-12-13 LAB — BASIC METABOLIC PANEL
ANION GAP: 8 (ref 5–15)
BUN: 19 mg/dL (ref 6–20)
CALCIUM: 8 mg/dL — AB (ref 8.9–10.3)
CO2: 25 mmol/L (ref 22–32)
Chloride: 101 mmol/L (ref 101–111)
Creatinine, Ser: 2.02 mg/dL — ABNORMAL HIGH (ref 0.61–1.24)
GFR, EST AFRICAN AMERICAN: 43 mL/min — AB (ref >60)
GFR, EST NON AFRICAN AMERICAN: 37 mL/min — AB (ref >60)
GLUCOSE: 169 mg/dL — AB (ref 65–99)
Potassium: 4.3 mmol/L (ref 3.5–5.1)
Sodium: 134 mmol/L — ABNORMAL LOW (ref 135–145)

## 2016-12-13 LAB — SURGICAL PCR SCREEN
MRSA, PCR: POSITIVE — AB
STAPHYLOCOCCUS AUREUS: POSITIVE — AB

## 2016-12-13 LAB — CBC
HEMATOCRIT: 29.8 % — AB (ref 39.0–52.0)
Hemoglobin: 10.2 g/dL — ABNORMAL LOW (ref 13.0–17.0)
MCH: 32.2 pg (ref 26.0–34.0)
MCHC: 34.2 g/dL (ref 30.0–36.0)
MCV: 94 fL (ref 78.0–100.0)
Platelets: 273 10*3/uL (ref 150–400)
RBC: 3.17 MIL/uL — ABNORMAL LOW (ref 4.22–5.81)
RDW: 13.5 % (ref 11.5–15.5)
WBC: 9.1 10*3/uL (ref 4.0–10.5)

## 2016-12-13 LAB — VANCOMYCIN, TROUGH: Vancomycin Tr: 15 ug/mL (ref 15–20)

## 2016-12-13 SURGERY — AMPUTATION DIGIT
Anesthesia: General | Site: Toe | Laterality: Left

## 2016-12-13 MED ORDER — MUPIROCIN 2 % EX OINT
TOPICAL_OINTMENT | CUTANEOUS | Status: AC
  Filled 2016-12-13: qty 22

## 2016-12-13 MED ORDER — CHLORHEXIDINE GLUCONATE CLOTH 2 % EX PADS
6.0000 | MEDICATED_PAD | Freq: Every day | CUTANEOUS | Status: AC
  Administered 2016-12-13 – 2016-12-17 (×4): 6 via TOPICAL

## 2016-12-13 MED ORDER — FENTANYL CITRATE (PF) 100 MCG/2ML IJ SOLN
INTRAMUSCULAR | Status: AC
  Filled 2016-12-13: qty 2

## 2016-12-13 MED ORDER — HYDROMORPHONE HCL 1 MG/ML IJ SOLN
INTRAMUSCULAR | Status: AC
  Filled 2016-12-13: qty 1

## 2016-12-13 MED ORDER — PROPOFOL 10 MG/ML IV BOLUS
INTRAVENOUS | Status: AC
  Filled 2016-12-13: qty 20

## 2016-12-13 MED ORDER — MEPERIDINE HCL 50 MG/ML IJ SOLN: 6.2500 mg | INTRAMUSCULAR | Status: DC | PRN

## 2016-12-13 MED ORDER — LACTATED RINGERS IV SOLN
INTRAVENOUS | Status: DC
  Administered 2016-12-13 (×3): via INTRAVENOUS

## 2016-12-13 MED ORDER — 0.9 % SODIUM CHLORIDE (POUR BTL) OPTIME
TOPICAL | Status: DC | PRN
  Administered 2016-12-13: 1000 mL

## 2016-12-13 MED ORDER — HYDROMORPHONE HCL 1 MG/ML IJ SOLN
0.2500 mg | INTRAMUSCULAR | Status: DC | PRN
  Administered 2016-12-13 (×2): 0.5 mg via INTRAVENOUS

## 2016-12-13 MED ORDER — MIDAZOLAM HCL 5 MG/5ML IJ SOLN
INTRAMUSCULAR | Status: DC | PRN
  Administered 2016-12-13: 2 mg via INTRAVENOUS

## 2016-12-13 MED ORDER — MIDAZOLAM HCL 2 MG/2ML IJ SOLN: 0.5000 mg | Freq: Once | INTRAMUSCULAR | Status: DC | PRN

## 2016-12-13 MED ORDER — MUPIROCIN 2 % EX OINT
1.0000 "application " | TOPICAL_OINTMENT | Freq: Two times a day (BID) | CUTANEOUS | Status: AC
  Administered 2016-12-13 – 2016-12-17 (×10): 1 via NASAL
  Filled 2016-12-13: qty 22

## 2016-12-13 MED ORDER — LIDOCAINE HCL (CARDIAC) 20 MG/ML IV SOLN
INTRAVENOUS | Status: DC | PRN
  Administered 2016-12-13: 80 mg via INTRAVENOUS

## 2016-12-13 MED ORDER — PROMETHAZINE HCL 25 MG/ML IJ SOLN: 6.2500 mg | INTRAMUSCULAR | Status: DC | PRN

## 2016-12-13 MED ORDER — FENTANYL CITRATE (PF) 100 MCG/2ML IJ SOLN
INTRAMUSCULAR | Status: DC | PRN
  Administered 2016-12-13: 100 ug via INTRAVENOUS
  Administered 2016-12-13: 25 ug via INTRAVENOUS

## 2016-12-13 MED ORDER — MIDAZOLAM HCL 2 MG/2ML IJ SOLN
INTRAMUSCULAR | Status: AC
  Filled 2016-12-13: qty 2

## 2016-12-13 MED ORDER — LIDOCAINE 2% (20 MG/ML) 5 ML SYRINGE
INTRAMUSCULAR | Status: AC
  Filled 2016-12-13: qty 5

## 2016-12-13 MED ORDER — PROPOFOL 10 MG/ML IV BOLUS
INTRAVENOUS | Status: DC | PRN
  Administered 2016-12-13: 300 mg via INTRAVENOUS

## 2016-12-13 MED ORDER — ONDANSETRON HCL 4 MG/2ML IJ SOLN
INTRAMUSCULAR | Status: AC
  Filled 2016-12-13: qty 2

## 2016-12-13 SURGICAL SUPPLY — 33 items
BAG ZIPLOCK 12X15 (MISCELLANEOUS) ×3 IMPLANT
BANDAGE ACE 6X5 VEL STRL LF (GAUZE/BANDAGES/DRESSINGS) ×3 IMPLANT
BANDAGE ESMARK 6X9 LF (GAUZE/BANDAGES/DRESSINGS) ×1 IMPLANT
BNDG COHESIVE 3X5 TAN STRL LF (GAUZE/BANDAGES/DRESSINGS) ×3 IMPLANT
BNDG COHESIVE 6X5 TAN STRL LF (GAUZE/BANDAGES/DRESSINGS) ×3 IMPLANT
BNDG ESMARK 6X9 LF (GAUZE/BANDAGES/DRESSINGS) ×3
COVER SURGICAL LIGHT HANDLE (MISCELLANEOUS) ×3 IMPLANT
CUFF TOURN SGL QUICK 34 (TOURNIQUET CUFF) ×2
CUFF TRNQT CYL 34X4X40X1 (TOURNIQUET CUFF) ×1 IMPLANT
DRAPE SHEET LG 3/4 BI-LAMINATE (DRAPES) ×3 IMPLANT
DRAPE SURG 17X11 SM STRL (DRAPES) ×6 IMPLANT
DRAPE U-SHAPE 47X51 STRL (DRAPES) ×6 IMPLANT
DRSG ADAPTIC 3X8 NADH LF (GAUZE/BANDAGES/DRESSINGS) ×3 IMPLANT
DRSG VAC ATS SM SENSATRAC (GAUZE/BANDAGES/DRESSINGS) ×3 IMPLANT
DURAPREP 26ML APPLICATOR (WOUND CARE) ×3 IMPLANT
ELECT REM PT RETURN 15FT ADLT (MISCELLANEOUS) ×3 IMPLANT
GAUZE SPONGE 4X4 12PLY STRL (GAUZE/BANDAGES/DRESSINGS) ×6 IMPLANT
GLOVE ORTHO TXT STRL SZ7.5 (GLOVE) ×3 IMPLANT
GOWN STRL REUS W/ TWL XL LVL3 (GOWN DISPOSABLE) ×1 IMPLANT
GOWN STRL REUS W/TWL LRG LVL3 (GOWN DISPOSABLE) ×3 IMPLANT
GOWN STRL REUS W/TWL XL LVL3 (GOWN DISPOSABLE) ×2
KIT BASIN OR (CUSTOM PROCEDURE TRAY) ×3 IMPLANT
NS IRRIG 1000ML POUR BTL (IV SOLUTION) ×3 IMPLANT
PACK TOTAL JOINT (CUSTOM PROCEDURE TRAY) ×3 IMPLANT
PAD CAST 4YDX4 CTTN HI CHSV (CAST SUPPLIES) ×1 IMPLANT
PADDING CAST COTTON 4X4 STRL (CAST SUPPLIES) ×2
POSITIONER SURGICAL ARM (MISCELLANEOUS) ×6 IMPLANT
STOCKINETTE 8 INCH (MISCELLANEOUS) ×3 IMPLANT
SUCTION FRAZIER HANDLE 10FR (MISCELLANEOUS) ×2
SUCTION TUBE FRAZIER 10FR DISP (MISCELLANEOUS) ×1 IMPLANT
SUT ETHILON 2 0 PSLX (SUTURE) ×6 IMPLANT
TOWEL OR 17X26 10 PK STRL BLUE (TOWEL DISPOSABLE) ×3 IMPLANT
WATER STERILE IRR 1500ML POUR (IV SOLUTION) ×3 IMPLANT

## 2016-12-13 NOTE — Progress Notes (Signed)
Rx Brief note: Vancomycin  Assessement: -Scr=2.02 -VT=15 mg/L goal 15-20 mg/L (not at Css)  Plan: -Continue Vancomycin 1750 mg IV q24h -f/u Scr/cultures/additional levels  Thanks Dorrene German 12/13/2016 11:10 PM

## 2016-12-13 NOTE — Progress Notes (Signed)
CRITICAL VALUE ALERT  Critical Value:  MRSA (+)  Date & Time Notied:  12/13/16 00:32  Provider Notified: Via text page  Orders Received/Actions taken: Precautions and treatment started

## 2016-12-13 NOTE — Anesthesia Preprocedure Evaluation (Signed)
Anesthesia Evaluation  Patient identified by MRN, date of birth, ID band Patient awake    Reviewed: Allergy & Precautions, NPO status , Patient's Chart, lab work & pertinent test results, reviewed documented beta blocker date and time   History of Anesthesia Complications Negative for: history of anesthetic complications  Airway Mallampati: I  TM Distance: >3 FB Neck ROM: Full    Dental  (+) Dental Advisory Given   Pulmonary asthma (rarely uses inhalers) ,    breath sounds clear to auscultation       Cardiovascular hypertension, Pt. on medications and Pt. on home beta blockers (-) angina+ CAD (non-obstructive by cath, EF 45%)   Rhythm:Regular Rate:Normal     Neuro/Psych Diabetic peripheral neuropathy    GI/Hepatic GERD  Medicated and Controlled,(+)     substance abuse  alcohol use, Colon cancer   Endo/Other  diabetes (glu 152), Insulin DependentMorbid obesity  Renal/GU Renal InsufficiencyRenal disease     Musculoskeletal   Abdominal (+) + obese,   Peds  Hematology Hb 10.2   Anesthesia Other Findings Foot and ankle cellulitic  Reproductive/Obstetrics                             Anesthesia Physical Anesthesia Plan  ASA: III  Anesthesia Plan: General   Post-op Pain Management:    Induction: Intravenous  Airway Management Planned: LMA  Additional Equipment:   Intra-op Plan:   Post-operative Plan:   Informed Consent: I have reviewed the patients History and Physical, chart, labs and discussed the procedure including the risks, benefits and alternatives for the proposed anesthesia with the patient or authorized representative who has indicated his/her understanding and acceptance.   Dental advisory given  Plan Discussed with: CRNA and Surgeon  Anesthesia Plan Comments: (Plan routine monitors, GA- LMA Ok)        Anesthesia Quick Evaluation

## 2016-12-13 NOTE — Progress Notes (Signed)
[facility] PROGRESS NOTE  TRENDEN HAZELRIGG OZD:[id number] DOB: 08-07-1970 DOA: 12/11/2016 PCP: Shawnee Knapp, MD  Interim summary and HPI 46 y.o. male with history of metastatic colon cancer receiving chemotherapy, diabetes mellitus uncontrolled, hypertension, morbid obesity, who had an infected ulcer of the fourth toe of the left foot about a month ago that almost completely healed with antibiotics.  After he completed the antibiotics, however, he stopped dressing the ulcer on his left foot.  For the last week he has had increased swelling of the left foot with some redness and blackness of the fourth toe that started 3 days ago. The blackness extended into the forefoot and the redness extended up to the mid shin. He denied fevers, chills, vomiting aside from what he otherwise expected with his chemotherapy. He was encouraged by his primary care doctor to come to the emergency department due to concern of developing sepsis from his left foot cellulitis.  He has had sharp constant pain of the left toes 7 out of 10 that was better after Dilaudid in the emergency department. Admitted with sepsis (tachycardic, elevated WBC's and x-ray findings suggesting osteomyelitis.  Assessment/Plan: 1-sepsis: due to left foot cellulitis and left fourth toe osteomyelitis  -Patient has remained afebrile and his wbc's are back to normal  -Will continue current antibiotic therapy and the plan is for the patient to proceed with toe amputation and debridement later today.  -Will follow clinical response and post operative orthopedic recommendation  -Will follow cultures, continue supportive care and as needed analgesics. -Patient is hemodynamically stable and just waiting for surgery.  2-hyponatremia and AKI -Due to intravascular depletion and decreased perfusion in presence of sepsis.  -Will continue IV fluids -Pharmacy assisting with medication dose adjustment for his renal function -Will minimize the use of  nephrotoxic agents -Follow renal function trend.   3-metastatic colon cancer -Continue outpatient follow-up with oncology service And currently holding any chemotherapy.   4-HTN -Blood pressure is a stable and well control. BP is stable overall -We'll continue holding HCTZ and lisinopril -We'll continue reduce dose of Coreg  5-diabetes type 2 on chronic insulin -Hemoglobin A1c 7.6, demonstrating good control  -Will continue holding oral hypoglycemic agents while inpatient -Will continue sliding scale insulin and Lantus. last A1C 7.6 -Modified carbohydrate diet has been ordered, which will be resumed once surgery completed today.  6-hx of CAD -No chest pain, no shortness of breath, no acute ischemic changes appreciated on EKG.  -After surgery will resume aspirin -Will continue the use of beta blocker.   7-GERD -Will continue PPI  Code Status: Full code Family Communication: no family at bedside  Disposition Plan: Patient remains hospitalized. Plan is for surgical amputation and debridement of his left foot (fourth toe).  Will continue current antibiotics will follow clinical response.    Consultants:  Orthopedic service (Dr. Lorin Mercy)  Procedures:  Left 4th toe amputation and debridement planned for 6/1 afternoon.  Antibiotics:  Vancomycin and zosyn 5/30  HPI/Subjective: Afebrile, no chest pain, no shortness of breath. Patient reports some intermittent left foot pain.  Objective: Vitals:   12/12/16 2317 12/13/16 0616  BP: 134/73 125/68  Pulse: (!) 104 92  Resp: 19 18  Temp: 99.2 F (37.3 C) 98.9 F (37.2 C)    Intake/Output Summary (Last 24 hours) at 12/13/16 1159 Last data filed at 12/13/16 0700  Gross per 24 hour  Intake             2765 ml  Output  350 ml  Net             2415 ml   Filed Weights   12/11/16 1259 12/11/16 1804  Weight: (!) 169.2 kg (373 lb) (!) 169.2 kg (373 lb)    Exam:   General: Obese, afebrile, in no acute distress  and denying chest pain and shortness of breath. Patient reports to be ready for surgery and endorses good control of his intermittent left foot pain with current analgesics. Denies nausea and vomiting.  Cardiovascular: RRR, no rubs, no gallops, no murmurs.  Respiratory: Good air movement bilaterally, no wheezing, no crackles    Abdomen: Obese, soft, nontender, nondistended, positive bowel sounds.   Musculoskeletal: Trace edema bilaterally appreciated. There is no clubbing. Left fourth toe with gangrene changes and with surrounded erythema and swelling. Patient reports in intermittent throbbing pain.    Data Reviewed: Basic Metabolic Panel:  Recent Labs Lab 12/11/16 1412 12/12/16 0317 12/13/16 0511  NA 130* 132* 134*  K 4.4 4.5 4.3  CL 97* 98* 101  CO2 23 26 25   GLUCOSE 222* 170* 169*  BUN 12 17 19   CREATININE 1.36* 1.77* 2.02*  CALCIUM 8.9 8.3* 8.0*   Liver Function Tests:  Recent Labs Lab 12/11/16 1412 12/12/16 0317  AST 18 14*  ALT 20 17  ALKPHOS 80 72  BILITOT 1.3* 1.3*  PROT 7.6 6.7  ALBUMIN 3.4* 3.0*   CBC:  Recent Labs Lab 12/11/16 1000 12/11/16 1412 12/12/16 0317 12/13/16 0511  WBC 11.7* 12.0* 10.2 9.1  NEUTROABS  --  6.5  --   --   HGB 13.7* 12.1* 11.3* 10.2*  HCT 38.5* 35.6* 32.6* 29.8*  MCV 94.0 94.4 94.2 94.0  PLT  --  465* 331 273   CBG:  Recent Labs Lab 12/12/16 1149 12/12/16 1630 12/12/16 2034 12/13/16 0845 12/13/16 1143  GLUCAP 194* 165* 190* 203* 187*    Recent Results (from the past 240 hour(s))  Blood culture (routine x 2)     Status: None (Preliminary result)   Collection Time: 12/11/16  2:12 PM  Result Value Ref Range Status   Specimen Description BLOOD RIGHT CHEST PORTA CATH  Final   Special Requests   Final    BOTTLES DRAWN AEROBIC AND ANAEROBIC Blood Culture adequate volume   Culture   Final    NO GROWTH 2 DAYS Performed at [facility], [place] [address], [place], [place] [address]    Report Status PENDING   Incomplete  Blood culture (routine x 2)     Status: None (Preliminary result)   Collection Time: 12/11/16  3:35 PM  Result Value Ref Range Status   Specimen Description BLOOD LEFT HAND  Final   Special Requests   Final    BOTTLES DRAWN AEROBIC AND ANAEROBIC Blood Culture adequate volume   Culture   Final    NO GROWTH 2 DAYS Performed at [facility], [place] [address], [place], [place] [address]    Report Status PENDING  Incomplete  Surgical pcr screen     Status: Abnormal   Collection Time: 12/12/16  5:57 PM  Result Value Ref Range Status   MRSA, PCR POSITIVE (A) NEGATIVE Final    Comment: RESULT CALLED TO, READ BACK BY AND VERIFIED WITH: Wynelle Cleveland [id number] @ 0032 BY J SCOTTON    Staphylococcus aureus POSITIVE (A) NEGATIVE Final    Comment:        The Xpert SA Assay (FDA approved for NASAL specimens in patients  over 29 years of age), is one component of a comprehensive surveillance program.  Test performance has been validated by [facility] for patients greater than or equal to 34 year old. It is not intended to diagnose infection nor to guide or monitor treatment. RESULT CALLED TO, READ BACK BY AND VERIFIED WITH: Wynelle Cleveland [id number] @ 0032 BY J SCOTTON      Studies: Dg Toe 4th Left  Result Date: 12/11/2016 CLINICAL DATA:  Fourth toe pain and discomfort EXAM: LEFT FOURTH TOE COMPARISON:  11/05/2016 FINDINGS: Destructive changes are now seen distal aspect of the fourth proximal phalanx consistent with osteomyelitis. Soft tissue swelling of the fourth digit is noted. No other focal abnormality is seen. IMPRESSION: Erosive changes in the distal aspect of the fourth proximal phalanx consistent with osteomyelitis. Electronically Signed   By: Inez Catalina M.D.   On: 12/11/2016 14:49    Scheduled Meds: . Chlorhexidine Gluconate Cloth  6 each Topical Q0600  . feeding supplement (ENSURE ENLIVE)  237 mL Oral BID BM  . feeding supplement (PRO-STAT SUGAR FREE 64)  30 mL Oral  TID WC  . insulin aspart  0-15 Units Subcutaneous TID WC  . insulin aspart  0-5 Units Subcutaneous QHS  . insulin glargine  20 Units Subcutaneous Daily  . mupirocin ointment  1 application Nasal BID  . pantoprazole  80 mg Oral Daily  . polyethylene glycol  17 g Oral Daily   Continuous Infusions: . sodium chloride 75 mL/hr at 12/11/16 1926  . piperacillin-tazobactam (ZOSYN)  IV Stopped (12/13/16 0806)  . vancomycin Stopped (12/13/16 0014)     Time spent: 30 minutes    Barton Dubois  [facility] Pager [phone]. If 7PM-7AM, please contact night-coverage at [email or web address], password [facility] 12/13/2016, 11:59 AM  LOS: 2 days

## 2016-12-13 NOTE — Transfer of Care (Signed)
Immediate Anesthesia Transfer of Care Note  Patient: Chad [name]  Procedure(s) Performed: Procedure(s): LEFT FOURTH TOE AMPUTATION (Left)  Patient Location: PACU  Anesthesia Type:General  Level of Consciousness:  sedated, patient cooperative and responds to stimulation  Airway & Oxygen Therapy:Patient Spontanous Breathing and Patient connected to face mask oxgen  Post-op Assessment:  Report given to PACU RN and Post -op Vital signs reviewed and stable  Post vital signs:  Reviewed and stable  Last Vitals:  Vitals:   12/13/16 0616 12/13/16 1408  BP: 125/68 130/64  Pulse: 92 82  Resp: 18 (!) 21  Temp: 37.2 C 22.1 C    Complications: No apparent anesthesia complications

## 2016-12-13 NOTE — Brief Op Note (Signed)
12/11/2016 - 12/13/2016  6:41 PM  PATIENT:  Chad [name]  47 y.o. male  PRE-OPERATIVE DIAGNOSIS:  left fourth toe gangrene with osteomyelitis  [name]-OPERATIVE DIAGNOSIS:  eft fourth toe gangrene with osteomyelitis  PROCEDURE:  Procedure(s): LEFT FOURTH TOE AMPUTATION (Left) thru metatarsal neck level ( transmet amputation 4th toe)  SURGEON:  Surgeon(s) and Role:    * Marybelle Killings, MD - Primary  PHYSICIAN ASSISTANT:   ASSISTANTS: none   ANESTHESIA:   general  EBL:  Total I/O In: 400 [I.V.:400] Out: 5 [Blood:5]  BLOOD ADMINISTERED:none  DRAINS: none   LOCAL MEDICATIONS USED:  NONE  SPECIMEN:  Toe 4th left  DISPOSITION OF SPECIMEN:  PATHOLOGY  COUNTS:  YES  TOURNIQUET:  * Missing tourniquet times found for documented tourniquets in log:  [id number] *  DICTATION: .Viviann Spare Dictation  PLAN OF CARE: already IP  PATIENT DISPOSITION:  PACU - hemodynamically stable.   Delay start of Pharmacological VTE agent (>24hrs) due to surgical blood loss or risk of bleeding: yes

## 2016-12-13 NOTE — Anesthesia Postprocedure Evaluation (Signed)
Anesthesia Post Note  Patient: Chad [name]  Procedure(s) Performed: Procedure(s) (LRB): LEFT FOURTH TOE AMPUTATION (Left)     Patient location during evaluation: PACU Anesthesia Type: General Level of consciousness: awake and alert, patient cooperative and oriented Pain management: pain level controlled Vital Signs Assessment: post-procedure vital signs reviewed and stable Respiratory status: spontaneous breathing, nonlabored ventilation, respiratory function stable and patient connected to nasal cannula oxygen Cardiovascular status: blood pressure returned to baseline and stable Postop Assessment: no signs of nausea or vomiting Anesthetic complications: no    Last Vitals:  Vitals:   12/13/16 1900 12/13/16 1915  BP: 126/77 124/76  Pulse: 98 99  Resp: 16 14  Temp:      Last Pain:  Vitals:   12/13/16 1915  TempSrc:   PainSc: 5                  Feasel,E. Sruti Ayllon

## 2016-12-13 NOTE — Progress Notes (Signed)
Pharmacy Antibiotic Note  Chad [name] is a 46 y.o. male with colon cancer currently undergoing chemotherapy and diabetic foot infection with 4th toe ulcer.  He recently completed a course of keflex and bactrim.  He presented to the ED on 12/11/2016 with c/o bleeding and drainage from wound site, swelling extending from toe to forefoot to mid shin.  Xray shows erosion c/w osteomyelitis; MRI pending. Pharmacy is consulted to dose vancomycin and zosyn for wound infection/L 4th toe osteomyelitis.  Today, 12/13/2016: - Afebrile, WBC improved to WNL - SCr continues to trend up with 2.02 today (crcl~45 N)  - plan for toe amputation today   Plan: - continue Zosyn 3.375g IV Q8H infused over 4hrs. - continue vancomycin 1750 mg IV q24h - with worsening of renal function, will check vancomycin trough level tonight to assess clearance - pharmacy will f/u and adjust dose if needed - of note, combination of vancomycin and zosyn can increase risk for nephrotoxicity.  If renal function continues to decline, consider consulting ID for abx recommendation ____________________________  Height: 6\' 3"  (190.5 cm) Weight: (!) 373 lb (169.2 kg) IBW/kg (Calculated) : 84.5  Temp (24hrs), Avg:99.1 F (37.3 C), Min:98.9 F (37.2 C), Max:99.2 F (37.3 C)   Recent Labs Lab 12/11/16 1000 12/11/16 1412 12/11/16 1418 12/12/16 0317 12/13/16 0511  WBC 11.7* 12.0*  --  10.2 9.1  CREATININE  --  1.36*  --  1.77* 2.02*  LATICACIDVEN  --   --  1.50  --   --     Estimated Creatinine Clearance: 74.9 mL/min (A) (by C-G formula based on SCr of 2.02 mg/dL (H)).    No Known Allergies    Antimicrobials this admission:  5/30 vanc>> 5/30 zosyn>>  Dose adjustments this admission:  5/31 Vancomycin dose decreased for increasing SCr.  Microbiology results:  5/30 BCx:  Gretta Arab PharmD, BCPS Pager [phone] 12/13/2016 8:36 AM

## 2016-12-13 NOTE — Anesthesia Procedure Notes (Signed)
Procedure Name: LMA Insertion Date/Time: 12/13/2016 6:15 PM Performed by: Claudia Desanctis Pre-anesthesia Checklist: Emergency Drugs available, Patient identified, Suction available and Patient being monitored Patient Re-evaluated:Patient Re-evaluated prior to inductionOxygen Delivery Method: Circle system utilized Preoxygenation: Pre-oxygenation with 100% oxygen Intubation Type: IV induction Ventilation: Mask ventilation without difficulty LMA: LMA inserted LMA Size: 5.0 Number of attempts: 1 Placement Confirmation: positive ETCO2 and breath sounds checked- equal and bilateral Tube secured with: Tape Dental Injury: Teeth and Oropharynx as per pre-operative assessment

## 2016-12-13 NOTE — H&P (View-Only) (Signed)
Reason for Consult: Right fourth toe osteomyelitis, diabetes with gangrene fourth toe Referring Physician: Dr. Rubie Maid is an 46 y.o. male.  HPI: 46 year old male with poorly controlled diabetes, hypertension, morbid obesity and metastatic colon cancer receiving chemotherapy and it infection of his fourth toe initially with an ulcer from rubbing against the adjacent toe. He was treated initially with antibiotics and got some improvement over. Time stop dressing changes on his toe last week developed increasing pain swelling and progressive blackness of the fourth toe that started 4 days ago. He then developed erythema extending up to the midfoot and was admitted for antibiotics after x-rays emergency room demonstrated osteomyelitis of the fourth toe. An MRI was pending however his body weight exceeded the maximum limit of the MRI table and test was unable to be performed. Patient has diabetic neuropathy but is complaining of toe pain receiving narcotic medication. Currently he is on vancomycin and Zosyn.  Past Medical History:  Diagnosis Date  . Cancer [facility])    colon cancer  . Coronary artery disease   . Diabetes mellitus without complication ([place]) 10/8248   oral agents only (07/2015)  . ETOH abuse 2014  . Fatty liver 12/2014   on ultrasound. pt obese and abuses ETOH  . Gallstone 12/2014   GB stones and sludge on ultrasound and CT,  . GERD (gastroesophageal reflux disease)   . Hypertension   . Morbid obesity with BMI of 50.0-59.9, adult ([place]) 03/2013  . Rectal bleeding 05/2015   07/2015 colonoscopy with malignant appearing sigmoid mass. Additional rectal, descending, polyps  . Shoulder pain, bilateral    due to previous injury    Past Surgical History:  Procedure Laterality Date  . CARDIAC CATHETERIZATION N/A 04/06/2015   Procedure: Left Heart Cath and Coronary Angiography;  Surgeon: Belva Crome, MD;  Location: [place] [facility];  Service: Cardiovascular;  Laterality:  N/A;  . COLONOSCOPY N/A 07/25/2015   Procedure: COLONOSCOPY;  Surgeon: Gatha Mayer, MD;  Location: [facility];  Service: Gastroenterology;  Laterality: N/A;  . CYSTOSCOPY  10/18/2015   Procedure: CYSTOSCOPY FLEXIBLE URETHRAL DIALATION AND FOLEY PLACEMENT;  Surgeon: Alexis Frock, MD;  Location: [facility];  Service: Urology;;  . ESOPHAGOGASTRODUODENOSCOPY N/A 07/25/2015   Procedure: ESOPHAGOGASTRODUODENOSCOPY (EGD);  Surgeon: Gatha Mayer, MD;  Location: [facility];  Service: Gastroenterology;  Laterality: N/A;  . PORTACATH PLACEMENT Right 08/01/2016   Procedure: INSERTION PORT-A-CATH WITH ULTRASOUND AND FLUOROSOPIC GUIDANCE - RIGHT INTERNAL JUGULAR;  Surgeon: Michael Boston, MD;  Location: [place];  Service: General;  Laterality: Right;    Family History  Problem Relation Age of Onset  . Diabetes Father   . Pancreatic cancer Father 31       former smoker, but had not smoked in 78 years  . Cancer Maternal Uncle        d. 42s; unknown type of cancer -metastatic throughout abdomen   . Other Brother        oldest brother has had a normal colonoscopy  . Heart attack Maternal Grandfather        d. 57-58; heavy smoker and drinker  . Heart attack Paternal Grandmother        d. early 43s  . Heart attack Paternal Grandfather 3  . Heart failure Maternal Uncle   . Skin cancer Maternal Uncle 42       NOS type; was out in the sun a lot  . Brain cancer Maternal Uncle  NOS type; mother's maternal half-brother dx. 67-68  . Cirrhosis Paternal Uncle   . Cancer Paternal Uncle        (x2-3) uncles with NOS cancers; d. 83s    Social History:  reports that he has never smoked. His smokeless tobacco use includes Chew. He reports that he drinks alcohol. He reports that he does not use drugs.  Allergies: No Known Allergies  Medications: I have reviewed the patient's current medications.  Results for orders placed or performed during the hospital encounter of 12/11/16 (from the past 48 hour(s))    Comprehensive metabolic panel     Status: Abnormal   Collection Time: 12/11/16  2:12 PM  Result Value Ref Range   Sodium 130 (L) 135 - 145 mmol/L   Potassium 4.4 3.5 - 5.1 mmol/L   Chloride 97 (L) 101 - 111 mmol/L   CO2 23 22 - 32 mmol/L   Glucose, Bld 222 (H) 65 - 99 mg/dL   BUN 12 6 - 20 mg/dL   Creatinine, Ser 1.36 (H) 0.61 - 1.24 mg/dL   Calcium 8.9 8.9 - 10.3 mg/dL   Total Protein 7.6 6.5 - 8.1 g/dL   Albumin 3.4 (L) 3.5 - 5.0 g/dL   AST 18 15 - 41 U/L   ALT 20 17 - 63 U/L   Alkaline Phosphatase 80 38 - 126 U/L   Total Bilirubin 1.3 (H) 0.3 - 1.2 mg/dL   GFR calc non Af Amer >60 >60 mL/min   GFR calc Af Amer >60 >60 mL/min    Comment: (NOTE) The eGFR has been calculated using the CKD EPI equation. This calculation has not been validated in all clinical situations. eGFR's persistently <60 mL/min signify possible Chronic Kidney Disease.    Anion gap 10 5 - 15  CBC with Differential     Status: Abnormal   Collection Time: 12/11/16  2:12 PM  Result Value Ref Range   WBC 12.0 (H) 4.0 - 10.5 K/uL   RBC 3.77 (L) 4.22 - 5.81 MIL/uL   Hemoglobin 12.1 (L) 13.0 - 17.0 g/dL   HCT 35.6 (L) 39.0 - 52.0 %   MCV 94.4 78.0 - 100.0 fL   MCH 32.1 26.0 - 34.0 pg   MCHC 34.0 30.0 - 36.0 g/dL   RDW 13.5 11.5 - 15.5 %   Platelets 465 (H) 150 - 400 K/uL   Neutrophils Relative % 54 %   Neutro Abs 6.5 1.7 - 7.7 K/uL   Lymphocytes Relative 27 %   Lymphs Abs 3.3 0.7 - 4.0 K/uL   Monocytes Relative 17 %   Monocytes Absolute 2.0 (H) 0.1 - 1.0 K/uL   Eosinophils Relative 2 %   Eosinophils Absolute 0.2 0.0 - 0.7 K/uL   Basophils Relative 0 %   Basophils Absolute 0.0 0.0 - 0.1 K/uL  Blood culture (routine x 2)     Status: None (Preliminary result)   Collection Time: 12/11/16  2:12 PM  Result Value Ref Range   Specimen Description BLOOD RIGHT CHEST PORTA CATH    Special Requests      BOTTLES DRAWN AEROBIC AND ANAEROBIC Blood Culture adequate volume   Culture      NO GROWTH < 24  HOURS Performed at [facility], [address] [place], [place] [address]    Report Status PENDING   I-Stat CG4 Lactic Acid, ED     Status: None   Collection Time: 12/11/16  2:18 PM  Result Value Ref Range   Lactic  Acid, Venous 1.50 0.5 - 1.9 mmol/L  Blood culture (routine x 2)     Status: None (Preliminary result)   Collection Time: 12/11/16  3:35 PM  Result Value Ref Range   Specimen Description BLOOD LEFT HAND    Special Requests      BOTTLES DRAWN AEROBIC AND ANAEROBIC Blood Culture adequate volume   Culture      NO GROWTH < 24 HOURS Performed at [facility] [address] [place], [place] [address]    Report Status PENDING   Glucose, capillary     Status: Abnormal   Collection Time: 12/11/16  8:50 PM  Result Value Ref Range   Glucose-Capillary 175 (H) 65 - 99 mg/dL  Urinalysis, Routine w reflex microscopic     Status: Abnormal   Collection Time: 12/11/16  9:15 PM  Result Value Ref Range   Color, Urine AMBER (A) YELLOW    Comment: BIOCHEMICALS MAY BE AFFECTED BY COLOR   APPearance HAZY (A) CLEAR   Specific Gravity, Urine 1.020 1.005 - 1.030   pH 5.0 5.0 - 8.0   Glucose, UA NEGATIVE NEGATIVE mg/dL   Hgb urine dipstick NEGATIVE NEGATIVE   Bilirubin Urine NEGATIVE NEGATIVE   Ketones, ur NEGATIVE NEGATIVE mg/dL   Protein, ur 30 (A) NEGATIVE mg/dL   Nitrite NEGATIVE NEGATIVE   Leukocytes, UA NEGATIVE NEGATIVE   RBC / HPF 0-5 0 - 5 RBC/hpf   WBC, UA 6-30 0 - 5 WBC/hpf   Bacteria, UA RARE (A) NONE SEEN   Squamous Epithelial / LPF 0-5 (A) NONE SEEN   Mucous PRESENT    Hyaline Casts, UA PRESENT    Amorphous Crystal PRESENT   CBC     Status: Abnormal   Collection Time: 12/12/16  3:17 AM  Result Value Ref Range   WBC 10.2 4.0 - 10.5 K/uL   RBC 3.46 (L) 4.22 - 5.81 MIL/uL   Hemoglobin 11.3 (L) 13.0 - 17.0 g/dL   HCT 32.6 (L) 39.0 - 52.0 %   MCV 94.2 78.0 - 100.0 fL   MCH 32.7 26.0 - 34.0 pg   MCHC 34.7 30.0 - 36.0 g/dL   RDW 13.6 11.5 - 15.5 %    Platelets 331 150 - 400 K/uL  Sedimentation rate     Status: Abnormal   Collection Time: 12/12/16  3:17 AM  Result Value Ref Range   Sed Rate 77 (H) 0 - 16 mm/hr  C-reactive protein     Status: Abnormal   Collection Time: 12/12/16  3:17 AM  Result Value Ref Range   CRP 18.4 (H) <1.0 mg/dL    Comment: Performed at [facility], [address] [place], [place] [address]  Comprehensive metabolic panel     Status: Abnormal   Collection Time: 12/12/16  3:17 AM  Result Value Ref Range   Sodium 132 (L) 135 - 145 mmol/L   Potassium 4.5 3.5 - 5.1 mmol/L   Chloride 98 (L) 101 - 111 mmol/L   CO2 26 22 - 32 mmol/L   Glucose, Bld 170 (H) 65 - 99 mg/dL   BUN 17 6 - 20 mg/dL   Creatinine, Ser 1.77 (H) 0.61 - 1.24 mg/dL   Calcium 8.3 (L) 8.9 - 10.3 mg/dL   Total Protein 6.7 6.5 - 8.1 g/dL   Albumin 3.0 (L) 3.5 - 5.0 g/dL   AST 14 (L) 15 - 41 U/L   ALT 17 17 - 63 U/L   Alkaline Phosphatase 72 38 -  126 U/L   Total Bilirubin 1.3 (H) 0.3 - 1.2 mg/dL   GFR calc non Af Amer 44 (L) >60 mL/min   GFR calc Af Amer 51 (L) >60 mL/min    Comment: (NOTE) The eGFR has been calculated using the CKD EPI equation. This calculation has not been validated in all clinical situations. eGFR's persistently <60 mL/min signify possible Chronic Kidney Disease.    Anion gap 8 5 - 15  Glucose, capillary     Status: Abnormal   Collection Time: 12/12/16  7:44 AM  Result Value Ref Range   Glucose-Capillary 182 (H) 65 - 99 mg/dL  Glucose, capillary     Status: Abnormal   Collection Time: 12/12/16 11:49 AM  Result Value Ref Range   Glucose-Capillary 194 (H) 65 - 99 mg/dL  Glucose, capillary     Status: Abnormal   Collection Time: 12/12/16  4:30 PM  Result Value Ref Range   Glucose-Capillary 165 (H) 65 - 99 mg/dL  Glucose, capillary     Status: Abnormal   Collection Time: 12/12/16  8:34 PM  Result Value Ref Range   Glucose-Capillary 190 (H) 65 - 99 mg/dL    Dg Toe 4th Left  Result Date:  12/11/2016 CLINICAL DATA:  Fourth toe pain and discomfort EXAM: LEFT FOURTH TOE COMPARISON:  11/05/2016 FINDINGS: Destructive changes are now seen distal aspect of the fourth proximal phalanx consistent with osteomyelitis. Soft tissue swelling of the fourth digit is noted. No other focal abnormality is seen. IMPRESSION: Erosive changes in the distal aspect of the fourth proximal phalanx consistent with osteomyelitis. Electronically Signed   By: Inez Catalina M.D.   On: 12/11/2016 14:49    ROS patient denies the fever chills no recent weight gain although his BMI is elevated with obesity. He has had hyperglycemia poorly controlled diabetes. Previous cardiac catheterization September 2016. Colonoscopy 07/25/2015. Positive for colon cancer on chemotherapy. Port-A-Cath placement 08/01/2016. Patient has never smoked. History of gallstones, fatty liver, EtOH abuse, coronary artery disease. Blood pressure 134/73, pulse (!) 104, temperature 99.2 F (37.3 C), temperature source Oral, resp. rate 19, height '6\' 3"'  (1.905 m), weight (!) 373 lb (169.2 kg), SpO2 96 %. Physical Exam  Constitutional: He is oriented to person, place, and time. He appears well-developed and well-nourished.  HENT:  Head: Normocephalic and atraumatic.  Eyes: Pupils are equal, round, and reactive to light.  Neck: Neck supple. No thyromegaly present.  Cardiovascular: Normal rate and regular rhythm.   Respiratory: Effort normal.  GI: Soft.  Neurological: He is alert and oriented to person, place, and time.  Skin:  Left fourth toe necrosis. This extends to the MCP joint level. Slight pinkness on the plantar surface just past the MCP joint for about 1 cm. we refilled the toe. Slight erythema from resolving cellulitis dorsally over the metatarsals third fourth and fifth. He has palpable pulses dorsalis pedis posterior tib both right and left. Dorsal plantar foot has brisk capillary refill.    Assessment/Plan: Left fourth toe osteomyelitis  with gangrene of the toe. We'll proceed with left fourth toe amputation and likely VAC placement if there is not adequate tissue for closure. If his metatarsal phalangeal joint is involved then he may have to have the fourth metatarsal head also debrided. Planned procedure discussed with patient risk surgery discussed questions was answered he understands request we proceed.  Marybelle Killings 12/12/2016, 11:31 PM

## 2016-12-13 NOTE — Interval H&P Note (Signed)
History and Physical Interval Note:  12/13/2016 6:02 PM  Chad [name]  has presented today for surgery, with the diagnosis of left fourth toe gangrene with osteomyelitis  The various methods of treatment have been discussed with the patient and family. After consideration of risks, benefits and other options for treatment, the patient has consented to  Procedure(s): LEFT FOURTH TOE AMPUTATION POSSIBLE WOUND VAC PLACEMENT (Left) as a surgical intervention .  The patient's history has been reviewed, patient examined, no change in status, stable for surgery.  I have reviewed the patient's chart and labs.  Questions were answered to the patient's satisfaction.     Marybelle Killings

## 2016-12-14 ENCOUNTER — Encounter (HOSPITAL_COMMUNITY): Payer: Self-pay | Admitting: Orthopaedic Surgery

## 2016-12-14 DIAGNOSIS — D62 Acute posthemorrhagic anemia: Secondary | ICD-10-CM

## 2016-12-14 LAB — CBC WITH DIFFERENTIAL/PLATELET
BASOS PCT: 1 %
Basophils Absolute: 0 10*3/uL (ref 0.0–0.1)
EOS ABS: 0.4 10*3/uL (ref 0.0–0.7)
EOS PCT: 5 %
HCT: 30.7 % — ABNORMAL LOW (ref 39.0–52.0)
HEMOGLOBIN: 10.2 g/dL — AB (ref 13.0–17.0)
Lymphocytes Relative: 25 %
Lymphs Abs: 2 10*3/uL (ref 0.7–4.0)
MCH: 31.5 pg (ref 26.0–34.0)
MCHC: 33.2 g/dL (ref 30.0–36.0)
MCV: 94.8 fL (ref 78.0–100.0)
MONOS PCT: 19 %
Monocytes Absolute: 1.5 10*3/uL — ABNORMAL HIGH (ref 0.1–1.0)
NEUTROS PCT: 50 %
Neutro Abs: 4.1 10*3/uL (ref 1.7–7.7)
Platelets: 309 10*3/uL (ref 150–400)
RBC: 3.24 MIL/uL — AB (ref 4.22–5.81)
RDW: 13.4 % (ref 11.5–15.5)
WBC: 8 10*3/uL (ref 4.0–10.5)

## 2016-12-14 LAB — GLUCOSE, CAPILLARY
GLUCOSE-CAPILLARY: 204 mg/dL — AB (ref 65–99)
GLUCOSE-CAPILLARY: 163 mg/dL — AB (ref 65–99)
GLUCOSE-CAPILLARY: 200 mg/dL — AB (ref 65–99)
Glucose-Capillary: 164 mg/dL — ABNORMAL HIGH (ref 65–99)

## 2016-12-14 LAB — BASIC METABOLIC PANEL
Anion gap: 7 (ref 5–15)
BUN: 16 mg/dL (ref 6–20)
CALCIUM: 8.1 mg/dL — AB (ref 8.9–10.3)
CO2: 26 mmol/L (ref 22–32)
CREATININE: 1.81 mg/dL — AB (ref 0.61–1.24)
Chloride: 104 mmol/L (ref 101–111)
GFR calc non Af Amer: 43 mL/min — ABNORMAL LOW (ref >60)
GFR, EST AFRICAN AMERICAN: 49 mL/min — AB (ref >60)
Glucose, Bld: 212 mg/dL — ABNORMAL HIGH (ref 65–99)
Potassium: 4.3 mmol/L (ref 3.5–5.1)
SODIUM: 137 mmol/L (ref 135–145)

## 2016-12-14 MED ORDER — GABAPENTIN 100 MG PO CAPS
100.0000 mg | ORAL_CAPSULE | Freq: Three times a day (TID) | ORAL | Status: DC
  Administered 2016-12-14 – 2016-12-18 (×13): 100 mg via ORAL
  Filled 2016-12-14 (×13): qty 1

## 2016-12-14 MED ORDER — DOXYCYCLINE HYCLATE 100 MG PO TABS
100.0000 mg | ORAL_TABLET | Freq: Two times a day (BID) | ORAL | Status: DC
  Administered 2016-12-14 – 2016-12-15 (×2): 100 mg via ORAL
  Filled 2016-12-14 (×3): qty 1

## 2016-12-14 MED ORDER — INSULIN GLARGINE 100 UNIT/ML ~~LOC~~ SOLN
25.0000 [IU] | Freq: Every day | SUBCUTANEOUS | Status: DC
  Administered 2016-12-15 – 2016-12-18 (×4): 25 [IU] via SUBCUTANEOUS
  Filled 2016-12-14 (×5): qty 0.25

## 2016-12-14 MED ORDER — ENOXAPARIN SODIUM 80 MG/0.8ML ~~LOC~~ SOLN
80.0000 mg | SUBCUTANEOUS | Status: DC
  Administered 2016-12-14 – 2016-12-17 (×4): 80 mg via SUBCUTANEOUS
  Filled 2016-12-14 (×4): qty 0.8

## 2016-12-14 NOTE — Progress Notes (Signed)
[facility] PROGRESS NOTE  Chad [name] TZG:[id number] DOB: 1971/07/11 DOA: 12/11/2016 PCP: Shawnee Knapp, MD  Interim summary and HPI 46 y.o. male with history of metastatic colon cancer receiving chemotherapy, diabetes mellitus uncontrolled, hypertension, morbid obesity, who had an infected ulcer of the fourth toe of the left foot about a month ago that almost completely healed with antibiotics.  After he completed the antibiotics, however, he stopped dressing the ulcer on his left foot.  For the last week he has had increased swelling of the left foot with some redness and blackness of the fourth toe that started 3 days ago. The blackness extended into the forefoot and the redness extended up to the mid shin. He denied fevers, chills, vomiting aside from what he otherwise expected with his chemotherapy. He was encouraged by his primary care doctor to come to the emergency department due to concern of developing sepsis from his left foot cellulitis.  He has had sharp constant pain of the left toes 7 out of 10 that was better after Dilaudid in the emergency department. Admitted with sepsis (tachycardic, elevated WBC's and x-ray findings suggesting osteomyelitis.  Assessment/Plan: 1-sepsis: due to left foot cellulitis and left fourth toe osteomyelitis  -patient s/p amputation and debridement -no fever, normal WBC's and resolved sepsis features -will transition antibiotics to PO -continue supportive care and analgesics -orthopedic service to provide rec's for weight bearing, wound care and further post operative concerns. -patient doing ok overall.  2-hyponatremia and AKI -appears to be secondary to intravascular depletion and decreased perfusion in setting of sepsis. -now that IVF's has been given, nephrotoxic agents has continued to be held and sepsis essentially resolved; Cr is improving and trending down. -will monitor renal function -zosyn and vancomycin D/C -continue good  hydration  -sodium WNL now  3-metastatic colon cancer -continue outpatient follow up and treatment with oncology service   4-HTN -BP stable -continue holding lisinopril and HCTZ -will continue coreg  5-diabetes type 2 on chronic insulin -A1C 7.6; demonstrating good control as an outpatient -will continue SSI and lantus -continue modified carb diet   6-hx of CAD -no CP, no SOB -no ischemic changes on EKG and/or telemetry -will resume ASA and continue b-blocker   7-GERD -will continue PPI  8-acute blood loss anemia: postoperative -Hgb stable  -will monitor -no transfusion needed currently   Code Status: Full code Family Communication: no family at bedside  Disposition Plan: patient s/p amputation and debridement of his left 4 toe due to osteomyelitis; sepsis features resolved and source of infection removed. Will stop IV antibiotics, Start PO doxycycline and will start Neurotin for pain control and phantom syndrome.    Consultants:  Orthopedic service (Dr. Lorin Mercy)  Procedures:  Left 4th toe amputation and debridement 6/1  Antibiotics:  Vancomycin and zosyn 5/30>>>6/2  Doxycycline 6/2  HPI/Subjective: Afebrile, no CP, no SOB. Tolerated procedure well. Complaining of pain in his amputated toe.  Objective: Vitals:   12/14/16 0001 12/14/16 0404  BP: 122/72 112/77  Pulse: 97 (!) 115  Resp: 20 20  Temp: 99 F (37.2 C) 98.6 F (37 C)    Intake/Output Summary (Last 24 hours) at 12/14/16 1653 Last data filed at 12/14/16 1300  Gross per 24 hour  Intake             2750 ml  Output             1805 ml  Net  945 ml   Filed Weights   12/11/16 1259 12/11/16 1804  Weight: (!) 169.2 kg (373 lb) (!) 169.2 kg (373 lb)    Exam:   General: obese, in no distress, afebrile and denying CP or SOB. Reports having some numb/lightning pain on his amputated toe. No other complaints and otherwise doing ok.  Cardiovascular: RRR, no rubs, no gallops and no  murmur.  Respiratory: no wheezing, no crackles, good air movement bilaterally.     Abdomen: obese, soft, NT, ND, positive BS.   Musculoskeletal: trace edema bilaterally, no cyanosis. Left foot with wound-vac and dressings in place.   Data Reviewed: Basic Metabolic Panel:  Recent Labs Lab 12/11/16 1412 12/12/16 0317 12/13/16 0511 12/14/16 0850  NA 130* 132* 134* 137  K 4.4 4.5 4.3 4.3  CL 97* 98* 101 104  CO2 23 26 25 26   GLUCOSE 222* 170* 169* 212*  BUN 12 17 19 16   CREATININE 1.36* 1.77* 2.02* 1.81*  CALCIUM 8.9 8.3* 8.0* 8.1*   Liver Function Tests:  Recent Labs Lab 12/11/16 1412 12/12/16 0317  AST 18 14*  ALT 20 17  ALKPHOS 80 72  BILITOT 1.3* 1.3*  PROT 7.6 6.7  ALBUMIN 3.4* 3.0*   CBC:  Recent Labs Lab 12/11/16 1000 12/11/16 1412 12/12/16 0317 12/13/16 0511 12/14/16 0850  WBC 11.7* 12.0* 10.2 9.1 8.0  NEUTROABS  --  6.5  --   --  4.1  HGB 13.7* 12.1* 11.3* 10.2* 10.2*  HCT 38.5* 35.6* 32.6* 29.8* 30.7*  MCV 94.0 94.4 94.2 94.0 94.8  PLT  --  465* 331 273 309   CBG:  Recent Labs Lab 12/13/16 1710 12/13/16 1857 12/13/16 2206 12/14/16 0743 12/14/16 1138  GLUCAP 152* 150* 143* 204* 164*    Recent Results (from the past 240 hour(s))  Blood culture (routine x 2)     Status: None (Preliminary result)   Collection Time: 12/11/16  2:12 PM  Result Value Ref Range Status   Specimen Description BLOOD RIGHT CHEST PORTA CATH  Final   Special Requests   Final    BOTTLES DRAWN AEROBIC AND ANAEROBIC Blood Culture adequate volume   Culture   Final    NO GROWTH 2 DAYS Performed at [facility], [address] [place], [place] [address]    Report Status PENDING  Incomplete  Blood culture (routine x 2)     Status: None (Preliminary result)   Collection Time: 12/11/16  3:35 PM  Result Value Ref Range Status   Specimen Description BLOOD LEFT HAND  Final   Special Requests   Final    BOTTLES DRAWN AEROBIC AND ANAEROBIC Blood Culture adequate  volume   Culture   Final    NO GROWTH 2 DAYS Performed at [facility], [place] [address] [place], [place] [address]    Report Status PENDING  Incomplete  Surgical pcr screen     Status: Abnormal   Collection Time: 12/12/16  5:57 PM  Result Value Ref Range Status   MRSA, PCR POSITIVE (A) NEGATIVE Final    Comment: RESULT CALLED TO, READ BACK BY AND VERIFIED WITH: Wynelle Cleveland [id number] @ 0032 BY J SCOTTON    Staphylococcus aureus POSITIVE (A) NEGATIVE Final    Comment:        The Xpert SA Assay (FDA approved for NASAL specimens in patients over 7 years of age), is one component of a comprehensive surveillance program.  Test performance has been validated by [facility] for  patients greater than or equal to 90 year old. It is not intended to diagnose infection nor to guide or monitor treatment. RESULT CALLED TO, READ BACK BY AND VERIFIED WITH: Wynelle Cleveland [id number] @ 0032 BY J SCOTTON   Aerobic Culture (superficial specimen)     Status: None (Preliminary result)   Collection Time: 12/13/16  6:30 PM  Result Value Ref Range Status   Specimen Description WOUND LEFT FOURTH TOE AMPUTATION  Final   Special Requests NONE  Final   Gram Stain   Final    RARE WBC PRESENT, PREDOMINANTLY PMN RARE GRAM NEGATIVE RODS RARE GRAM POSITIVE COCCI IN PAIRS    Culture   Final    TOO YOUNG TO READ Performed at [facility], [place] [address], [place], [place] [address]    Report Status PENDING  Incomplete     Studies: No results found.  Scheduled Meds: . Chlorhexidine Gluconate Cloth  6 each Topical Q0600  . doxycycline  100 mg Oral Q12H  . feeding supplement (ENSURE ENLIVE)  237 mL Oral BID BM  . feeding supplement (PRO-STAT SUGAR FREE 64)  30 mL Oral TID WC  . gabapentin  100 mg Oral TID  . insulin aspart  0-15 Units Subcutaneous TID WC  . insulin aspart  0-5 Units Subcutaneous QHS  . insulin glargine  20 Units Subcutaneous Daily  . mupirocin ointment  1 application Nasal  BID  . pantoprazole  80 mg Oral Daily  . polyethylene glycol  17 g Oral Daily   Continuous Infusions: . sodium chloride 75 mL/hr at 12/13/16 1215  . lactated ringers 50 mL/hr at 12/13/16 2056     Time spent: 25 minutes    Barton Dubois  [facility] Pager [phone]. If 7PM-7AM, please contact night-coverage at [email or web address], password [facility] 12/14/2016, 4:53 PM  LOS: 3 days

## 2016-12-14 NOTE — Progress Notes (Signed)
PHARMACY CONSULT: Lovenox for VTE prophylaxis   Wt: 169.2 kg BMI:  46.6 Scr:  1.8 CrCl >30 ml/hr  H/H: 10.2/30.7 (low/stable postop) Pltc: 309 No bleeding or postop complications reported.  A/P:  Due to morbid obesity, begin weight-adjusted lovenox 0.5mg /kg (80 mg) sq q24h Monitor CBC and renal function, adjust as needed Monitor for bleeding and postop complications.  Gretta Arab PharmD, BCPS Pager [phone] 12/14/2016 4:55 PM

## 2016-12-15 LAB — CBC
HCT: 29.2 % — ABNORMAL LOW (ref 39.0–52.0)
Hemoglobin: 9.7 g/dL — ABNORMAL LOW (ref 13.0–17.0)
MCH: 32 pg (ref 26.0–34.0)
MCHC: 33.2 g/dL (ref 30.0–36.0)
MCV: 96.4 fL (ref 78.0–100.0)
PLATELETS: 306 10*3/uL (ref 150–400)
RBC: 3.03 MIL/uL — AB (ref 4.22–5.81)
RDW: 13.5 % (ref 11.5–15.5)
WBC: 7.4 10*3/uL (ref 4.0–10.5)

## 2016-12-15 LAB — GLUCOSE, CAPILLARY
GLUCOSE-CAPILLARY: 202 mg/dL — AB (ref 65–99)
Glucose-Capillary: 209 mg/dL — ABNORMAL HIGH (ref 65–99)
Glucose-Capillary: 189 mg/dL — ABNORMAL HIGH (ref 65–99)
Glucose-Capillary: 203 mg/dL — ABNORMAL HIGH (ref 65–99)

## 2016-12-15 LAB — BASIC METABOLIC PANEL
Anion gap: 5 (ref 5–15)
BUN: 16 mg/dL (ref 6–20)
CO2: 29 mmol/L (ref 22–32)
CREATININE: 1.75 mg/dL — AB (ref 0.61–1.24)
Calcium: 8.3 mg/dL — ABNORMAL LOW (ref 8.9–10.3)
Chloride: 105 mmol/L (ref 101–111)
GFR calc Af Amer: 51 mL/min — ABNORMAL LOW (ref >60)
GFR, EST NON AFRICAN AMERICAN: 44 mL/min — AB (ref >60)
Glucose, Bld: 208 mg/dL — ABNORMAL HIGH (ref 65–99)
Potassium: 4.1 mmol/L (ref 3.5–5.1)
SODIUM: 139 mmol/L (ref 135–145)

## 2016-12-15 MED ORDER — ASPIRIN EC 81 MG PO TBEC
81.0000 mg | DELAYED_RELEASE_TABLET | Freq: Every day | ORAL | Status: DC
  Administered 2016-12-15 – 2016-12-18 (×4): 81 mg via ORAL
  Filled 2016-12-15 (×3): qty 1

## 2016-12-15 MED ORDER — LINEZOLID 600 MG/300ML IV SOLN
600.0000 mg | Freq: Two times a day (BID) | INTRAVENOUS | Status: DC
  Administered 2016-12-15 – 2016-12-16 (×2): 600 mg via INTRAVENOUS
  Filled 2016-12-15 (×3): qty 300

## 2016-12-15 NOTE — Progress Notes (Signed)
Subjective: Pt stable - vac working on and off   Objective: Vital signs in last 24 hours: Temp:  [98.3 F (36.8 C)-98.6 F (37 C)] 98.4 F (36.9 C) (06/03 1416) Pulse Rate:  [82-97] 82 (06/03 1416) Resp:  [18-20] 18 (06/03 1416) BP: (131-158)/(71-105) 158/105 (06/03 1416) SpO2:  [97 %-100 %] 100 % (06/03 1416)  Intake/Output from previous day: 06/02 0701 - 06/03 0700 In: 480 [P.O.:480] Out: 2270 [Urine:2250; Drains:20] Intake/Output this shift: No intake/output data recorded.  Exam:  Intact pulses distally mild cellulitis laterally without tissue crepitus  Labs:  Recent Labs  12/13/16 0511 12/14/16 0850 12/15/16 0342  HGB 10.2* 10.2* 9.7*    Recent Labs  12/14/16 0850 12/15/16 0342  WBC 8.0 7.4  RBC 3.24* 3.03*  HCT 30.7* 29.2*  PLT 309 306    Recent Labs  12/14/16 0850 12/15/16 0342  NA 137 139  K 4.3 4.1  CL 104 105  CO2 26 29  BUN 16 16  CREATININE 1.81* 1.75*  GLUCOSE 212* 208*  CALCIUM 8.1* 8.3*   No results for input(s): LABPT, INR in the last 72 hours.  Assessment/Plan: Plan is for wall suction tonight - vac change am - Dr Lorin Mercy has discussed with primary team   Anderson [facility] 12/15/2016, 7:16 PM

## 2016-12-15 NOTE — Progress Notes (Signed)
Pt had uneventful day. Pain management was an issue today/ medicated per orders. Recd call from Dr. Lorin Mercy in regards to patient. Cont with plan of care

## 2016-12-15 NOTE — Op Note (Signed)
Preop diagnosis: Left fourth toe gangrene with osteomyelitis, diabetes.  Postop diagnosis same  Procedure: Left fourth toe amputation to the level of the metatarsal neck. (Trans-met amputation), placement of small VAC  Surgeon: Rodell Perna M.D.  Anesthesia Gen.  Tourniquet: Esmarch ankle tourniquet see anesthetic record)  Brief history 46 year old male with ulcer on the fourth toe associated with his diabetes. It was treated with antibiotics and dressing changes had improvement and then worsening with development of left toe gangrene with x-rays demonstrating osteomyelitis involving the proximal phalanx. Due to his weight 373 pounds he could not get an MRI scan at [facility] since he was over the weight limit for the MRI table. He had bounding pulses and is brought for toe amputation.  Procedure after standard prepping and draping patient guarded been on antibiotics with vancomycin and Zosyn. Timeout procedure was completed and extremity sheets and drapes were applied. Patient had some cellulitis of the dorsum of his foot which had improved on IV antibiotics. Leg was wrapped with Esmarch tourniquet at the ankle. Incision was made the longitudinally over the metatarsal phalangeal joint. Joint was opened and there is some evidence of purulence in the joint. Cultures were obtained. Due to the involvement metatarsal phalangeal joint amputation was done to the metatarsal neck. Complete toe removal. Due to the necrosis of the skin with gangrene there was insufficient skin for closure as well as some purulence was able to be milked from the dorsum subtendinous tissue. Copious irrigation was performed and repeat milking and irrigation until there were no purulent material remaining. A VAC was placed small VAC with the low leak rate. Patient had bounding pulses however with his hyperglycemia and history of elevated A1c in the past he may require potential further surgery. We'll say does with the VAC  changes and continue IV antibiotics. Patient tolerated the procedure well transferred to the recovery room in stable condition.

## 2016-12-15 NOTE — Progress Notes (Signed)
Patient ID: Chad [name], male   DOB: 09-04-1970, 46 y.o.   MRN: [id number] Cultures showing Gram pos and gram neg organisms, reintubated for better growth. There was still sub Q purlence that was irrigated repetitively, VAC alarming clot, I call RN to try a few minutes of wall suction to see if that clears the clot. VAC change tomorrow. Discussed cont IV ABX now that renal function better since surgery was not at level proximal to all infected tissue .  I called and discussed with pt above plan and also Dr. Dyann Kief.

## 2016-12-15 NOTE — Progress Notes (Signed)
[facility] PROGRESS NOTE  Chad [name] [id number]:[id number] DOB: 1970/08/23 DOA: 12/11/2016 PCP: Shawnee Knapp, MD  Interim summary and HPI 46 y.o. male with history of metastatic colon cancer receiving chemotherapy, diabetes mellitus uncontrolled, hypertension, morbid obesity, who had an infected ulcer of the fourth toe of the left foot about a month ago that almost completely healed with antibiotics.  After he completed the antibiotics, however, he stopped dressing the ulcer on his left foot.  For the last week he has had increased swelling of the left foot with some redness and blackness of the fourth toe that started 3 days ago. The blackness extended into the forefoot and the redness extended up to the mid shin. He denied fevers, chills, vomiting aside from what he otherwise expected with his chemotherapy. He was encouraged by his primary care doctor to come to the emergency department due to concern of developing sepsis from his left foot cellulitis.  He has had sharp constant pain of the left toes 7 out of 10 that was better after Dilaudid in the emergency department. Admitted with sepsis (tachycardic, elevated WBC's and x-ray findings suggesting osteomyelitis.  Assessment/Plan: 1-sepsis: due to left foot cellulitis and left fourth toe osteomyelitis  -patient s/p amputation and debridement; wound still with normal strength  -No fever, no signs of sepsis. Tolerated procedure well -Orthopedic service demonstrating puse while changing the dressing today. Wound VAC was reapplied continue supportive care and analgesics; will resume IV antibiotics. -orthopedic service to provide rec's for weight bearing, wound care and further post operative concerns. -patient doing ok overall. -will continue lovenox  For DVT prophylaxis.  2-hyponatremia and AKI -Secondary to prerenal azotemia and secondary intervascular depletion and impair perfusion and from left foot cellulitis and gangrenous changes on 4th  toe. -Will monitor renal function  -Patient started on IV linezolid  -Per orthopedic service there is still a lot of pus coming from amputated wound -Patient will have PRN analgesics and the use of Neurontin. -Sodium within normal limits now.  -will monitor renal function -zosyn and vancomycin D/C -continue good hydration  -sodium WNL now  3-metastatic colon cancer -Overall stable. -Patient will continue follow-up and further treatment as per oncology services to be dictated dictate once he is stable for discharge. continue outpatient follow up and treatment with oncology service   4-HTN -Stable blood pressure -Will continue monitoring patient is off lisinopril and HCTZ (given renal dysfunction) -Will continue the use of Coreg for blood pressure control.  Insulin  5-diabetes mellitus with diabetic foot ulcer. -Hemoglobin A1c 7.6  -Continue holding oral hypoglycemic regimen while inpatient -Will continue sliding scale insulin and Lantus.  -A1C 7.6; demonstrating good control as an outpatient -Continue modified carbohydrate diet  6-hx of CAD -No chest pain, no shortness of breath -no ischemic changes appreciated on EKG and/or telemetry  -Will continue aspirin and beta blocker.  7-GERD -We'll continue PPI  8-acute blood loss anemia: postoperative -Hemoglobin has remained stable overall -No signs of acute bleeding otherwise appreciated -Will monitor treatment No transfusion needed.  Code Status: Full code Family Communication: no family at bedside  Disposition Plan: patient s/p amputation and debridement of his left 4 toe due to osteomyelitis; sepsis features resolved and source of infection partially removed; per orthopedic service recommendations we will resume IV antibiotics; patient is still with pus coming out of amputated wound. Cultures re-incubated for better growth..   Consultants:  Orthopedic service (Dr. Lorin Mercy)  Procedures:  Left 4th toe amputation and  debridement 6/1  Antibiotics:  Vancomycin and zosyn 5/30>>>6/2  Doxycycline 6/2>>>6/3  Linezolid 6/3  HPI/Subjective: Afebrile, no chest pain, no shortness of breath, no nausea or vomiting. Patient is still complaining of left foot pain.  Objective: Vitals:   12/15/16 0435 12/15/16 1416  BP: 131/71 (!) 158/105  Pulse: 89 82  Resp: 18 18  Temp: 98.6 F (37 C) 98.4 F (36.9 C)    Intake/Output Summary (Last 24 hours) at 12/15/16 1502 Last data filed at 12/15/16 1449  Gross per 24 hour  Intake              720 ml  Output              870 ml  Net             -150 ml   Filed Weights   12/11/16 1259 12/11/16 1804  Weight: (!) 169.2 kg (373 lb) (!) 169.2 kg (373 lb)    Exam:   General: Morbidly obese, no nausea, no vomiting, patient is afebrile. Reports having pain in his left foot (amputation). No chest pain, no shortness of breath. Wound-Vac in place.  Cardiovascular: S1 and S2, no rubs, no gallops, no murmurs. RRR.   Respiratory: Good air movement bilaterally, no wheezing, no crackles  Abdomen: Obese, soft, nontender, nondistended, positive bowel sounds, no guarding.   Musculoskeletal: Left foot with wound VAC in place, no cyanosis or significant swelling. Trace lower strandy edema appreciated on right lower strandy. Patient is still with purulent discharge coming out of amputated wound.  Data Reviewed: Basic Metabolic Panel:  Recent Labs Lab 12/11/16 1412 12/12/16 0317 12/13/16 0511 12/14/16 0850 12/15/16 0342  NA 130* 132* 134* 137 139  K 4.4 4.5 4.3 4.3 4.1  CL 97* 98* 101 104 105  CO2 23 26 25 26 29   GLUCOSE 222* 170* 169* 212* 208*  BUN 12 17 19 16 16   CREATININE 1.36* 1.77* 2.02* 1.81* 1.75*  CALCIUM 8.9 8.3* 8.0* 8.1* 8.3*   Liver Function Tests:  Recent Labs Lab 12/11/16 1412 12/12/16 0317  AST 18 14*  ALT 20 17  ALKPHOS 80 72  BILITOT 1.3* 1.3*  PROT 7.6 6.7  ALBUMIN 3.4* 3.0*   CBC:  Recent Labs Lab 12/11/16 1412  12/12/16 0317 12/13/16 0511 12/14/16 0850 12/15/16 0342  WBC 12.0* 10.2 9.1 8.0 7.4  NEUTROABS 6.5  --   --  4.1  --   HGB 12.1* 11.3* 10.2* 10.2* 9.7*  HCT 35.6* 32.6* 29.8* 30.7* 29.2*  MCV 94.4 94.2 94.0 94.8 96.4  PLT 465* 331 273 309 306   CBG:  Recent Labs Lab 12/14/16 1138 12/14/16 1726 12/14/16 2106 12/15/16 0730 12/15/16 1154  GLUCAP 164* 163* 200* 202* 209*    Recent Results (from the past 240 hour(s))  Blood culture (routine x 2)     Status: None (Preliminary result)   Collection Time: 12/11/16  2:12 PM  Result Value Ref Range Status   Specimen Description BLOOD RIGHT CHEST PORTA CATH  Final   Special Requests   Final    BOTTLES DRAWN AEROBIC AND ANAEROBIC Blood Culture adequate volume   Culture   Final    NO GROWTH 3 DAYS Performed at [facility], [address], [place], [place] [address]    Report Status PENDING  Incomplete  Blood culture (routine x 2)     Status: None (Preliminary result)   Collection Time: 12/11/16  3:35 PM  Result Value Ref Range Status   Specimen Description  BLOOD LEFT HAND  Final   Special Requests   Final    BOTTLES DRAWN AEROBIC AND ANAEROBIC Blood Culture adequate volume   Culture   Final    NO GROWTH 3 DAYS Performed at [facility], [address] [place], [place] [address]    Report Status PENDING  Incomplete  Surgical pcr screen     Status: Abnormal   Collection Time: 12/12/16  5:57 PM  Result Value Ref Range Status   MRSA, PCR POSITIVE (A) NEGATIVE Final    Comment: RESULT CALLED TO, READ BACK BY AND VERIFIED WITH: Wynelle Cleveland [id number] @ 0032 BY J SCOTTON    Staphylococcus aureus POSITIVE (A) NEGATIVE Final    Comment:        The Xpert SA Assay (FDA approved for NASAL specimens in patients over 70 years of age), is one component of a comprehensive surveillance program.  Test performance has been validated by [facility] for patients greater than or equal to 67 year old. It is not intended to  diagnose infection nor to guide or monitor treatment. RESULT CALLED TO, READ BACK BY AND VERIFIED WITH: DAWN BROWN,RN [id number] @ 0032 BY J SCOTTON   Aerobic Culture (superficial specimen)     Status: None (Preliminary result)   Collection Time: 12/13/16  6:30 PM  Result Value Ref Range Status   Specimen Description WOUND LEFT FOURTH TOE AMPUTATION  Final   Special Requests NONE  Final   Gram Stain   Final    RARE WBC PRESENT, PREDOMINANTLY PMN RARE GRAM NEGATIVE RODS RARE GRAM POSITIVE COCCI IN PAIRS    Culture   Final    FEW GROUP B STREP(S.AGALACTIAE)ISOLATED TESTING AGAINST S. AGALACTIAE NOT ROUTINELY PERFORMED DUE TO PREDICTABILITY OF AMP/PEN/VAN SUSCEPTIBILITY. CULTURE REINCUBATED FOR BETTER GROWTH Performed at [facility], [place] [address], [place], [place] [address]    Report Status PENDING  Incomplete     Studies: No results found.  Scheduled Meds: . Chlorhexidine Gluconate Cloth  6 each Topical Q0600  . enoxaparin (LOVENOX) injection  80 mg Subcutaneous Q24H  . feeding supplement (ENSURE ENLIVE)  237 mL Oral BID BM  . feeding supplement (PRO-STAT SUGAR FREE 64)  30 mL Oral TID WC  . gabapentin  100 mg Oral TID  . insulin aspart  0-15 Units Subcutaneous TID WC  . insulin aspart  0-5 Units Subcutaneous QHS  . insulin glargine  25 Units Subcutaneous Daily  . mupirocin ointment  1 application Nasal BID  . pantoprazole  80 mg Oral Daily  . polyethylene glycol  17 g Oral Daily   Continuous Infusions: . sodium chloride 75 mL/hr at 12/13/16 1215  . lactated ringers 50 mL/hr at 12/13/16 2056  . linezolid (ZYVOX) IV       Time spent: 25 minutes   Barton Dubois  [facility] Pager [phone]. If 7PM-7AM, please contact night-coverage at [email or web address], password [email or web address] 12/15/2016, 3:02 PM  LOS: 4 days

## 2016-12-16 ENCOUNTER — Telehealth: Payer: Self-pay | Admitting: Family Medicine

## 2016-12-16 DIAGNOSIS — Z808 Family history of malignant neoplasm of other organs or systems: Secondary | ICD-10-CM

## 2016-12-16 DIAGNOSIS — Z95828 Presence of other vascular implants and grafts: Secondary | ICD-10-CM

## 2016-12-16 DIAGNOSIS — Z8249 Family history of ischemic heart disease and other diseases of the circulatory system: Secondary | ICD-10-CM

## 2016-12-16 DIAGNOSIS — F1021 Alcohol dependence, in remission: Secondary | ICD-10-CM

## 2016-12-16 DIAGNOSIS — D649 Anemia, unspecified: Secondary | ICD-10-CM | POA: Diagnosis present

## 2016-12-16 DIAGNOSIS — N289 Disorder of kidney and ureter, unspecified: Secondary | ICD-10-CM

## 2016-12-16 DIAGNOSIS — Z9221 Personal history of antineoplastic chemotherapy: Secondary | ICD-10-CM

## 2016-12-16 DIAGNOSIS — Z800 Family history of malignant neoplasm of digestive organs: Secondary | ICD-10-CM

## 2016-12-16 DIAGNOSIS — E1142 Type 2 diabetes mellitus with diabetic polyneuropathy: Secondary | ICD-10-CM

## 2016-12-16 DIAGNOSIS — E1140 Type 2 diabetes mellitus with diabetic neuropathy, unspecified: Secondary | ICD-10-CM

## 2016-12-16 DIAGNOSIS — Z978 Presence of other specified devices: Secondary | ICD-10-CM

## 2016-12-16 DIAGNOSIS — Z833 Family history of diabetes mellitus: Secondary | ICD-10-CM

## 2016-12-16 DIAGNOSIS — Z89422 Acquired absence of other left toe(s): Secondary | ICD-10-CM

## 2016-12-16 DIAGNOSIS — Z794 Long term (current) use of insulin: Secondary | ICD-10-CM

## 2016-12-16 DIAGNOSIS — Z8379 Family history of other diseases of the digestive system: Secondary | ICD-10-CM

## 2016-12-16 DIAGNOSIS — F17290 Nicotine dependence, other tobacco product, uncomplicated: Secondary | ICD-10-CM

## 2016-12-16 DIAGNOSIS — L0889 Other specified local infections of the skin and subcutaneous tissue: Secondary | ICD-10-CM

## 2016-12-16 DIAGNOSIS — C186 Malignant neoplasm of descending colon: Secondary | ICD-10-CM

## 2016-12-16 DIAGNOSIS — B951 Streptococcus, group B, as the cause of diseases classified elsewhere: Secondary | ICD-10-CM

## 2016-12-16 DIAGNOSIS — Z6841 Body Mass Index (BMI) 40.0 and over, adult: Secondary | ICD-10-CM

## 2016-12-16 LAB — BASIC METABOLIC PANEL
ANION GAP: 8 (ref 5–15)
BUN: 15 mg/dL (ref 6–20)
CO2: 27 mmol/L (ref 22–32)
Calcium: 8.5 mg/dL — ABNORMAL LOW (ref 8.9–10.3)
Chloride: 106 mmol/L (ref 101–111)
Creatinine, Ser: 1.64 mg/dL — ABNORMAL HIGH (ref 0.61–1.24)
GFR calc Af Amer: 56 mL/min — ABNORMAL LOW (ref >60)
GFR, EST NON AFRICAN AMERICAN: 48 mL/min — AB (ref >60)
GLUCOSE: 208 mg/dL — AB (ref 65–99)
POTASSIUM: 3.9 mmol/L (ref 3.5–5.1)
Sodium: 141 mmol/L (ref 135–145)

## 2016-12-16 LAB — CBC
HEMATOCRIT: 29.5 % — AB (ref 39.0–52.0)
HEMOGLOBIN: 9.8 g/dL — AB (ref 13.0–17.0)
MCH: 32 pg (ref 26.0–34.0)
MCHC: 33.2 g/dL (ref 30.0–36.0)
MCV: 96.4 fL (ref 78.0–100.0)
Platelets: 314 10*3/uL (ref 150–400)
RBC: 3.06 MIL/uL — ABNORMAL LOW (ref 4.22–5.81)
RDW: 13.4 % (ref 11.5–15.5)
WBC: 8.1 10*3/uL (ref 4.0–10.5)

## 2016-12-16 LAB — GLUCOSE, CAPILLARY
GLUCOSE-CAPILLARY: 217 mg/dL — AB (ref 65–99)
Glucose-Capillary: 169 mg/dL — ABNORMAL HIGH (ref 65–99)
Glucose-Capillary: 171 mg/dL — ABNORMAL HIGH (ref 65–99)

## 2016-12-16 LAB — CULTURE, BLOOD (ROUTINE X 2)
CULTURE: NO GROWTH
Culture: NO GROWTH
SPECIAL REQUESTS: ADEQUATE
Special Requests: ADEQUATE

## 2016-12-16 MED ORDER — METRONIDAZOLE 500 MG PO TABS
500.0000 mg | ORAL_TABLET | Freq: Three times a day (TID) | ORAL | Status: DC
  Administered 2016-12-16 – 2016-12-18 (×7): 500 mg via ORAL
  Filled 2016-12-16 (×7): qty 1

## 2016-12-16 MED ORDER — DEXTROSE 5 % IV SOLN
1.0000 g | INTRAVENOUS | Status: DC
  Administered 2016-12-16: 1 g via INTRAVENOUS
  Filled 2016-12-16 (×2): qty 10

## 2016-12-16 MED ORDER — HYDRALAZINE HCL 20 MG/ML IJ SOLN
10.0000 mg | Freq: Three times a day (TID) | INTRAMUSCULAR | Status: DC | PRN
  Filled 2016-12-16: qty 0.5

## 2016-12-16 NOTE — Progress Notes (Addendum)
[facility] PROGRESS NOTE  Chad [name] [id number]:[id number] DOB: 1971/03/22 DOA: 12/11/2016 PCP: Shawnee Knapp, MD  Interim summary and HPI 46 y.o. male with history of metastatic colon cancer receiving chemotherapy, diabetes mellitus uncontrolled, hypertension, morbid obesity, who had an infected ulcer of the fourth toe of the left foot about a month ago that almost completely healed with antibiotics.  After he completed the antibiotics, however, he stopped dressing the ulcer on his left foot.  For the last week he has had increased swelling of the left foot with some redness and blackness of the fourth toe that started 3 days ago. The blackness extended into the forefoot and the redness extended up to the mid shin. He denied fevers, chills, vomiting aside from what he otherwise expected with his chemotherapy. He was encouraged by his primary care doctor to come to the emergency department due to concern of developing sepsis from his left foot cellulitis.  He has had sharp constant pain of the left toes 7 out of 10 that was better after Dilaudid in the emergency department. Admitted with sepsis (tachycardic, elevated WBC's and x-ray findings suggesting osteomyelitis.  Assessment/Plan: 1-sepsis: due to left foot cellulitis and left fourth toe osteomyelitis  -Sepsis features completely resolve. Patient afebrile, normal heart rate, normal wbc's and no tachypnea. -still with some intermittent pain; will continue PRN analgesics and continue neurontin. -due to findings of seropurulent discharge while changing dressings/woundvac; orthopedic service has consulted ID and will like to pursuit IV antibiotics regimen at discharge to guaranteed clearance of infection. Will set up home health nursing for further wound care at discharge. -will follow orthopedic service rec's for weight bearing, wound care and further post operative concerns. -patient improving and without signs of acute infection  -continue  Lovenox for DVT. -ID service consulted and will follow recommendations on abx's. Started on rocephin and flagyl  2-hyponatremia and AKI -Secondary to prerenal azotemia and secondary intravascular depletion/impair perfusion from sepsis. -Also continue use of nephrotoxic agents (lisinopril and HCTZ) contributing to his acute kidney injury -Will continue holding nephrotoxic agents -Creatinine steadily improving -Will follow renal function trend -Sodium level back to normal now, will monitor.  3-metastatic colon cancer -Stable overall.  -Continue outpatient follow-up and further treatment as per oncology service recommendation.   4-HTN -Blood pressure overall stable/but rising slightly -At this moment will continue holding lisinopril and HCTZ at least for another 24 hours (given acute kidney injury; which is currently recovering) -Will continue carvedilol -will add PRN hydralazine -Continue heart healthy diet  5-diabetes mellitus with diabetic foot ulcer. -A1c 7.6 -Continue holding oral hypoglycemic regimen -Will continue sliding scale insulin and Lantus -Continue modified carbohydrate diet.  6-hx of CAD -No chest pain, no shortness of breath  -We will continue aspirin and beta blocker  -That is no ischemic changes appreciated on EKG or telemetry  -At this moment will discontinue telemetry   7-GERD -Continue PPI -Discussed with patient about physical measures to improve symptom control (sitting upright while eating and staying upright after finishing his meals at least for 40-60 minutes, increase height on the head of the bed and minimizing daily consumption of spicy/over seasoning foods and carbonated substances.)  8-acute blood loss anemia: postoperative -No signs of bleeding  -Most likely secondary to blood loss during amputation  -Hemoglobin stable/slowly rising  -No transfusion anticipated currently.  -Will follow trend  Code Status: Full code Family Communication: no  family at bedside  Disposition Plan: patient s/p amputation and debridement of his left 4  toe due to osteomyelitis; sepsis features resolved and source of infection partially removed; per orthopedic service recommendations, ID has been consulted and patient might need IV antibiotics. Still with purulent discharge on the wound. Cultures from the OR re-incubated for better growth..   Consultants:  Orthopedic service (Dr. Lorin Mercy)  ID  Procedures:  Left 4th toe amputation and debridement 6/1  Antibiotics:  Vancomycin and zosyn 5/30>>>6/2  Doxycycline 6/2>>>6/3  Linezolid 6/3>>6/4  Rocephin and Flagyl 6/4  HPI/Subjective: Afebrile, no chest pain, no shortness of breath. Still complaining of left foot intermittent pain. Per orthopedic service seropurulent discharge appreciated while changing wound dressings/wound-vac  Objective: Vitals:   12/16/16 0630 12/16/16 1559  BP: (!) 147/65 (!) 164/71  Pulse: 64 70  Resp: 18 18  Temp: 98.7 F (37.1 C) 98.8 F (37.1 C)    Intake/Output Summary (Last 24 hours) at 12/16/16 1814 Last data filed at 12/16/16 1559  Gross per 24 hour  Intake             1755 ml  Output             2725 ml  Net             -970 ml   Filed Weights   12/11/16 1259 12/11/16 1804  Weight: (!) 169.2 kg (373 lb) (!) 169.2 kg (373 lb)    Exam:   General: Morbidly obese, in no acute distress, afebrile. Patient denies any nausea, vomiting, dysuria, hematuria, abdominal pain and shortness of breath. Still complaining of pain in his left foot.  Wound-vac replaced and dressing changes today by orthopedic service.   Cardiovascular:  S1 and S2, no rubs, no gallops, no murmurs.   Respiratory:  good air movement bilaterally, no wheezing, no crackles, no use of accessory muscles.   Abdomen: obese, soft, nontender, nondistended, positive bowel sounds, no guarding.   Musculoskeletal:  left foot with Wound-Vac in place, no cyanosis, trace of edema appreciated  bilaterally. Per orthopedic service report while changing dressings and exchanging wound VAC, there was still sero-purulence discharge appreciated.  Data Reviewed: Basic Metabolic Panel:  Recent Labs Lab 12/12/16 0317 12/13/16 0511 12/14/16 0850 12/15/16 0342 12/16/16 0319  NA 132* 134* 137 139 141  K 4.5 4.3 4.3 4.1 3.9  CL 98* 101 104 105 106  CO2 26 25 26 29 27   GLUCOSE 170* 169* 212* 208* 208*  BUN 17 19 16 16 15   CREATININE 1.77* 2.02* 1.81* 1.75* 1.64*  CALCIUM 8.3* 8.0* 8.1* 8.3* 8.5*   Liver Function Tests:  Recent Labs Lab 12/11/16 1412 12/12/16 0317  AST 18 14*  ALT 20 17  ALKPHOS 80 72  BILITOT 1.3* 1.3*  PROT 7.6 6.7  ALBUMIN 3.4* 3.0*   CBC:  Recent Labs Lab 12/11/16 1412 12/12/16 0317 12/13/16 0511 12/14/16 0850 12/15/16 0342 12/16/16 0319  WBC 12.0* 10.2 9.1 8.0 7.4 8.1  NEUTROABS 6.5  --   --  4.1  --   --   HGB 12.1* 11.3* 10.2* 10.2* 9.7* 9.8*  HCT 35.6* 32.6* 29.8* 30.7* 29.2* 29.5*  MCV 94.4 94.2 94.0 94.8 96.4 96.4  PLT 465* 331 273 309 306 314   CBG:  Recent Labs Lab 12/15/16 1154 12/15/16 1640 12/15/16 2129 12/16/16 0810 12/16/16 1158  GLUCAP 209* 189* 203* 169* 217*    Recent Results (from the past 240 hour(s))  Blood culture (routine x 2)     Status: None   Collection Time: 12/11/16  2:12 PM  Result Value Ref  Range Status   Specimen Description BLOOD RIGHT CHEST PORTA CATH  Final   Special Requests   Final    BOTTLES DRAWN AEROBIC AND ANAEROBIC Blood Culture adequate volume   Culture   Final    NO GROWTH 5 DAYS Performed at [facility], [address], [place], [place] [address]    Report Status 12/16/2016 FINAL  Final  Blood culture (routine x 2)     Status: None   Collection Time: 12/11/16  3:35 PM  Result Value Ref Range Status   Specimen Description BLOOD LEFT HAND  Final   Special Requests   Final    BOTTLES DRAWN AEROBIC AND ANAEROBIC Blood Culture adequate volume   Culture   Final    NO GROWTH 5  DAYS Performed at [facility], [place] [address] [place], [place] [address]    Report Status 12/16/2016 FINAL  Final  Surgical pcr screen     Status: Abnormal   Collection Time: 12/12/16  5:57 PM  Result Value Ref Range Status   MRSA, PCR POSITIVE (A) NEGATIVE Final    Comment: RESULT CALLED TO, READ BACK BY AND VERIFIED WITH: Wynelle Cleveland [id number] @ 0032 BY J SCOTTON    Staphylococcus aureus POSITIVE (A) NEGATIVE Final    Comment:        The Xpert SA Assay (FDA approved for NASAL specimens in patients over 31 years of age), is one component of a comprehensive surveillance program.  Test performance has been validated by [facility] for patients greater than or equal to 49 year old. It is not intended to diagnose infection nor to guide or monitor treatment. RESULT CALLED TO, READ BACK BY AND VERIFIED WITH: DAWN BROWN,RN [id number] @ 0032 BY J SCOTTON   Aerobic Culture (superficial specimen)     Status: None (Preliminary result)   Collection Time: 12/13/16  6:30 PM  Result Value Ref Range Status   Specimen Description WOUND LEFT FOURTH TOE AMPUTATION  Final   Special Requests NONE  Final   Gram Stain   Final    RARE WBC PRESENT, PREDOMINANTLY PMN RARE GRAM NEGATIVE RODS RARE GRAM POSITIVE COCCI IN PAIRS    Culture   Final    FEW GROUP B STREP(S.AGALACTIAE)ISOLATED TESTING AGAINST S. AGALACTIAE NOT ROUTINELY PERFORMED DUE TO PREDICTABILITY OF AMP/PEN/VAN SUSCEPTIBILITY. CULTURE REINCUBATED FOR BETTER GROWTH Performed at [facility], [place] [address], [place], [place] [address]    Report Status PENDING  Incomplete     Studies: No results found.  Scheduled Meds: . aspirin EC  81 mg Oral Daily  . Chlorhexidine Gluconate Cloth  6 each Topical Q0600  . enoxaparin (LOVENOX) injection  80 mg Subcutaneous Q24H  . feeding supplement (ENSURE ENLIVE)  237 mL Oral BID BM  . feeding supplement (PRO-STAT SUGAR FREE 64)  30 mL Oral TID WC  . gabapentin  100 mg Oral TID  .  insulin aspart  0-15 Units Subcutaneous TID WC  . insulin aspart  0-5 Units Subcutaneous QHS  . insulin glargine  25 Units Subcutaneous Daily  . metroNIDAZOLE  500 mg Oral Q8H  . mupirocin ointment  1 application Nasal BID  . pantoprazole  80 mg Oral Daily  . polyethylene glycol  17 g Oral Daily   Continuous Infusions: . sodium chloride 75 mL/hr at 12/15/16 1529  . cefTRIAXone (ROCEPHIN)  IV Stopped (12/16/16 1503)  . lactated ringers 50 mL/hr at 12/13/16 2056     Time spent: 25  minutes   Barton Dubois  [facility] Pager [phone]. If 7PM-7AM, please contact night-coverage at [email or web address], password [facility] 12/16/2016, 6:14 PM  LOS: 5 days

## 2016-12-16 NOTE — Care Management Note (Signed)
Case Management Note  Patient Details  Name: Chad [name] MRN: [id number] Date of Birth: Aug 21, 1970  Subjective/Objective: 46 y/o m admitted w/Sepsis. From home. For long term iv abx, may need Negative Wound Pressure(NWPT) Therapy-await WOC recc. [facility] chosen for HH-iv abx infusion, & HHRN for teaching. Patient already has a port a cath. [facility] rep Pam iv infusion following, HHRN-teaching rep Kim following. Await script, iv abx HHRN order-labs,flushes, & if NWPT is needed-[facility] can arrange-NWPT form on shadow chart for MD(who put the wound vac on) to complete highlights.                   Action/Plan:d/c home w/HHC.   Expected Discharge Date:   (unknown)               Expected Discharge Plan:  [place]  In-House Referral:     Discharge planning Services  CM Consult  Post Acute Care Choice:    Choice offered to:  Patient  DME Arranged:    DME Agency:     HH Arranged:  RN, IV Antibiotics HH Agency:     Status of Service:  In process, will continue to follow  If discussed at Long Length of Stay Meetings, dates discussed:    Additional Comments:  Dessa Phi, RN 12/16/2016, 1:42 PM

## 2016-12-16 NOTE — Progress Notes (Signed)
[place] pt for [facility] this hospital admission.  [facility] will provide HHRN, NPWT and Home [facility]fusion [facility] services  for IV ABX at home upon DC.  [facility] will be prepared for DC home when MD orders.  If patient discharges after hours, please call [phone].   Larry Sierras 12/16/2016, 2:37 PM

## 2016-12-16 NOTE — Telephone Encounter (Signed)
Cancelled rx, he has been in hospital since the rx 12/10/16 By dr Brigitte Pulse

## 2016-12-16 NOTE — Consult Note (Signed)
[facility] Nurse wound consult note Reason for Consult:Surgical wound to left foot.  Amputation left fourth metatarsal.  Apply NPWT VAC- had lost seal this AM.  Will replace.  Apply barrier strips to periwound skin to ensure intact seal.  Wound type:Surgical.  Infectious Pressure Injury POA: N/A Measurement: 4 cm x 1.5 cm x 2.5 cm.  Dressing bridged to dorsal foot to protect periwound skin (maceration noted today) as well as barrier strips.  (Left extra at bedside) Wound bed: Beefy red Drainage (amount, consistency, odor) Moderate serosanguinous. No odor.  Periwound:maceration at 9 and 3 o'clock to dorsal foot.  Erythema to plantar foot beneath toes.  Tender to touch Dressing procedure/placement/frequency:Cleanse wound with NS.  Fill wound depth with black foam.  Protect periwound skin with barrier strips. Setting is 100 mmHg. Change Mon/wed/Fri.  [place] [place] team will follow./  Domenic Moras RN BSN [facility] Pager [phone]

## 2016-12-16 NOTE — Telephone Encounter (Signed)
[facility] is calling to get a verbal ok to fill pt's rx of bactrim-ds/septia-ds  It was sent to one pharmacy but then to [facility] and they need to confirm ok to fill [facility] number [phone]

## 2016-12-16 NOTE — Consult Note (Signed)
[facility] for Infectious Disease    Date of Admission:  12/11/2016   Total days of antibiotics 6        Day 2 linezolid              Reason for Consult: Diabetic foot infection     Referring Physician: Dr. Rodell Perna Primary Care Physician: Dr. Delman Cheadle  Active Problems:   Diabetic infection of left foot ([place])   Morbid obesity ([place])   Neuropathy due to type 2 diabetes mellitus ([place])   Cancer of left colon ([place])   Sepsis ([place])   Metastatic cancer ([place])   Normocytic anemia   Acute renal insufficiency   . aspirin EC  81 mg Oral Daily  . Chlorhexidine Gluconate Cloth  6 each Topical Q0600  . enoxaparin (LOVENOX) injection  80 mg Subcutaneous Q24H  . feeding supplement (ENSURE ENLIVE)  237 mL Oral BID BM  . feeding supplement (PRO-STAT SUGAR FREE 64)  30 mL Oral TID WC  . gabapentin  100 mg Oral TID  . insulin aspart  0-15 Units Subcutaneous TID WC  . insulin aspart  0-5 Units Subcutaneous QHS  . insulin glargine  25 Units Subcutaneous Daily  . mupirocin ointment  1 application Nasal BID  . pantoprazole  80 mg Oral Daily  . polyethylene glycol  17 g Oral Daily    Recommendations: 1. Change linezolid to IV ceftriaxone and oral metronidazole   Assessment: Mr. Vorhees has a polymicrobial left diabetic foot infection complicated by left fourth MTP and toe osteomyelitis and soft tissue infection over the dorsum of the foot and ankle. His operative Gram stain shows gram-negative rods and gram-positive cocci. Operative cultures grown group B streptococcus. He is very interested in curing this infection and avoiding further surgery. I discussed antibiotic options with him. I will treat him with IV ceftriaxone and oral metronidazole. He quit drinking any alcohol about 2 months ago so there is no problem with potential Antabuse reaction to oral metronidazole. I will plan on at least 2 more weeks of antibiotic therapy but will arrange to see him back in clinic to help  determine optimal duration based on wound healing. He has some acute renal insufficiency but this will not require dose adjustment of ceftriaxone or metronidazole.    HPI: Chad [name] is a 46 y.o. male with diabetes and peripheral neuropathy. He developed an ulcer on his left fourth toe about 6 weeks ago that progressed to secondary infection. He received a 2 week course of dual oral antibiotic therapy and improved. However, he says that when he cut better he stopped taking care of the ulcer like he was supposed to. He recently flared up again leading to this admission. Clinically he had gangrene and an x-ray showed evidence of osteomyelitis in the left fourth proximal phalanx. He was started on empiric IV antibiotics and underwent amputation of the left fourth toe and transmetatarsal amputation. A wound VAC was placed. He was noted to have soft tissue infection extending up over the dorsum of the foot. He recently completed chemotherapy for metastatic colon cancer 3 weeks ago. He has a Port-A-Cath.   Review of Systems: Review of Systems  Constitutional: Positive for malaise/fatigue and weight loss. Negative for chills, diaphoresis and fever.  Respiratory: Negative for cough and shortness of breath.   Cardiovascular: Negative for chest pain.  Gastrointestinal: Negative for abdominal pain, diarrhea, nausea and vomiting.  Genitourinary: Negative for dysuria.  Musculoskeletal: Positive for joint pain.  Skin: Negative for rash.    Past Medical History:  Diagnosis Date  . Cancer [facility])    colon cancer  . Coronary artery disease   . Diabetes mellitus without complication ([place]) 12/8339   oral agents only (07/2015)  . ETOH abuse 2014  . Fatty liver 12/2014   on ultrasound. pt obese and abuses ETOH  . Gallstone 12/2014   GB stones and sludge on ultrasound and CT,  . GERD (gastroesophageal reflux disease)   . Hypertension   . Morbid obesity with BMI of 50.0-59.9, adult ([place]) 03/2013  . Rectal  bleeding 05/2015   07/2015 colonoscopy with malignant appearing sigmoid mass. Additional rectal, descending, polyps  . Shoulder pain, bilateral    due to previous injury    Social History  Substance Use Topics  . Smoking status: Never Smoker  . Smokeless tobacco: Current User    Types: Chew     Comment: occasionally dips tobacco - has used for approx 20 yrs (12/21/15)  . Alcohol use Yes     Comment: heavy drinking for 10 years, 2-3  drinks of liqour a night, has been cutting back.      Family History  Problem Relation Age of Onset  . Diabetes Father   . Pancreatic cancer Father 53       former smoker, but had not smoked in 65 years  . Cancer Maternal Uncle        d. 20s; unknown type of cancer -metastatic throughout abdomen   . Other Brother        oldest brother has had a normal colonoscopy  . Heart attack Maternal Grandfather        d. 57-58; heavy smoker and drinker  . Heart attack Paternal Grandmother        d. early 68s  . Heart attack Paternal Grandfather 20  . Heart failure Maternal Uncle   . Skin cancer Maternal Uncle 42       NOS type; was out in the sun a lot  . Brain cancer Maternal Uncle        NOS type; mother's maternal half-brother dx. 94-68  . Cirrhosis Paternal Uncle   . Cancer Paternal Uncle        (x2-3) uncles with NOS cancers; d. 28s   No Known Allergies  OBJECTIVE: Blood pressure (!) 147/65, pulse 64, temperature 98.7 F (37.1 C), temperature source Oral, resp. rate 18, height 6\' 3"  (1.905 m), weight (!) 373 lb (169.2 kg), SpO2 100 %.  Physical Exam  Constitutional: He is oriented to person, place, and time.  He is resting comfortably in bed.  Cardiovascular: Normal rate and regular rhythm.   No murmur heard. Pulmonary/Chest: Effort normal and breath sounds normal.  Port-A-Cath site looks good.  Abdominal: Soft. There is no tenderness.  Musculoskeletal: He exhibits edema. He exhibits no tenderness.  There is a VAC wound dressing in place and a  surgically absent left fourth toe. His left foot is dressed in a clean dry gauze dressing. He has some slight erythema and nonpitting edema to the level of the left mid shin.  Neurological: He is alert and oriented to person, place, and time.  Skin: No rash noted.  Psychiatric: Mood and affect normal.    Lab Results Lab Results  Component Value Date   WBC 8.1 12/16/2016   HGB 9.8 (L) 12/16/2016   HCT 29.5 (L) 12/16/2016   MCV 96.4 12/16/2016   PLT 314 12/16/2016  Lab Results  Component Value Date   CREATININE 1.64 (H) 12/16/2016   BUN 15 12/16/2016   NA 141 12/16/2016   K 3.9 12/16/2016   CL 106 12/16/2016   CO2 27 12/16/2016    Lab Results  Component Value Date   ALT 17 12/12/2016   AST 14 (L) 12/12/2016   ALKPHOS 72 12/12/2016   BILITOT 1.3 (H) 12/12/2016     Microbiology: Recent Results (from the past 240 hour(s))  Blood culture (routine x 2)     Status: None (Preliminary result)   Collection Time: 12/11/16  2:12 PM  Result Value Ref Range Status   Specimen Description BLOOD RIGHT CHEST PORTA CATH  Final   Special Requests   Final    BOTTLES DRAWN AEROBIC AND ANAEROBIC Blood Culture adequate volume   Culture   Final    NO GROWTH 4 DAYS Performed at [facility], [place] [address] [place], [place] [address]    Report Status PENDING  Incomplete  Blood culture (routine x 2)     Status: None (Preliminary result)   Collection Time: 12/11/16  3:35 PM  Result Value Ref Range Status   Specimen Description BLOOD LEFT HAND  Final   Special Requests   Final    BOTTLES DRAWN AEROBIC AND ANAEROBIC Blood Culture adequate volume   Culture   Final    NO GROWTH 4 DAYS Performed at [facility], [place] [address] [place], [place] [address]    Report Status PENDING  Incomplete  Surgical pcr screen     Status: Abnormal   Collection Time: 12/12/16  5:57 PM  Result Value Ref Range Status   MRSA, PCR POSITIVE (A) NEGATIVE Final    Comment: RESULT CALLED TO, READ BACK  BY AND VERIFIED WITH: Wynelle Cleveland [id number] @ 0032 BY J SCOTTON    Staphylococcus aureus POSITIVE (A) NEGATIVE Final    Comment:        The Xpert SA Assay (FDA approved for NASAL specimens in patients over 57 years of age), is one component of a comprehensive surveillance program.  Test performance has been validated by [facility] for patients greater than or equal to 76 year old. It is not intended to diagnose infection nor to guide or monitor treatment. RESULT CALLED TO, READ BACK BY AND VERIFIED WITH: DAWN BROWN,RN [id number] @ 0032 BY J SCOTTON   Aerobic Culture (superficial specimen)     Status: None (Preliminary result)   Collection Time: 12/13/16  6:30 PM  Result Value Ref Range Status   Specimen Description WOUND LEFT FOURTH TOE AMPUTATION  Final   Special Requests NONE  Final   Gram Stain   Final    RARE WBC PRESENT, PREDOMINANTLY PMN RARE GRAM NEGATIVE RODS RARE GRAM POSITIVE COCCI IN PAIRS    Culture   Final    FEW GROUP B STREP(S.AGALACTIAE)ISOLATED TESTING AGAINST S. AGALACTIAE NOT ROUTINELY PERFORMED DUE TO PREDICTABILITY OF AMP/PEN/VAN SUSCEPTIBILITY. CULTURE REINCUBATED FOR BETTER GROWTH Performed at [facility], [place] [address] [place], [place] [address]    Report Status PENDING  Incomplete    Michel Bickers, MD [facility] [place] [facility] [phone] pager   [phone] cell 12/16/2016, 1:15 PM

## 2016-12-16 NOTE — Progress Notes (Signed)
   Subjective: 3 Days Post-Op Procedure(s) (LRB): LEFT FOURTH TOE AMPUTATION (Left) Patient reports pain as mild.    Objective: Vital signs in last 24 hours: Temp:  [98.4 F (36.9 C)-98.7 F (37.1 C)] 98.7 F (37.1 C) (06/04 0630) Pulse Rate:  [64-82] 64 (06/04 0630) Resp:  [18] 18 (06/04 0630) BP: (147-158)/(65-105) 147/65 (06/04 0630) SpO2:  [100 %] 100 % (06/04 0630)  Intake/Output from previous day: 06/03 0701 - 06/04 0700 In: 2925 [P.O.:720; I.V.:1605; IV Piggyback:600] Out: 2650 [Urine:2650] Intake/Output this shift: No intake/output data recorded.   Recent Labs  12/14/16 0850 12/15/16 0342 12/16/16 0319  HGB 10.2* 9.7* 9.8*    Recent Labs  12/15/16 0342 12/16/16 0319  WBC 7.4 8.1  RBC 3.03* 3.06*  HCT 29.2* 29.5*  PLT 306 314    Recent Labs  12/15/16 0342 12/16/16 0319  NA 139 141  K 4.1 3.9  CL 105 106  CO2 29 27  BUN 16 15  CREATININE 1.75* 1.64*  GLUCOSE 208* 208*  CALCIUM 8.3* 8.5*   No results for input(s): LABPT, INR in the last 72 hours.  VAC removed. wet to dry saline applied. VAC nurse to see. some seropurulent fluid in sponge.   No results found.  Assessment/Plan: 3 Days Post-Op Procedure(s) (LRB): LEFT FOURTH TOE AMPUTATION (Left)  Plan:    I called [facility] nurse about VAC , she will see and apply.   ID consult for IV ABX at Home and ID clinic followup,   If  Looks good on VAC change Wednesday then from my standpoint OK for home with IV ABX per culture findings.  Cultures pending from surgery.   Marybelle Killings 12/16/2016, 8:00 AM

## 2016-12-17 ENCOUNTER — Encounter (HOSPITAL_COMMUNITY): Payer: Self-pay | Admitting: Orthopaedic Surgery

## 2016-12-17 DIAGNOSIS — A4102 Sepsis due to Methicillin resistant Staphylococcus aureus: Secondary | ICD-10-CM

## 2016-12-17 DIAGNOSIS — B9562 Methicillin resistant Staphylococcus aureus infection as the cause of diseases classified elsewhere: Secondary | ICD-10-CM

## 2016-12-17 LAB — CREATININE, SERUM
CREATININE: 1.61 mg/dL — AB (ref 0.61–1.24)
GFR calc Af Amer: 57 mL/min — ABNORMAL LOW (ref >60)
GFR calc non Af Amer: 49 mL/min — ABNORMAL LOW (ref >60)

## 2016-12-17 LAB — GLUCOSE, CAPILLARY
GLUCOSE-CAPILLARY: 192 mg/dL — AB (ref 65–99)
GLUCOSE-CAPILLARY: 171 mg/dL — AB (ref 65–99)
Glucose-Capillary: 104 mg/dL — ABNORMAL HIGH (ref 65–99)
Glucose-Capillary: 169 mg/dL — ABNORMAL HIGH (ref 65–99)
Glucose-Capillary: 140 mg/dL — ABNORMAL HIGH (ref 65–99)

## 2016-12-17 LAB — AEROBIC CULTURE  (SUPERFICIAL SPECIMEN)

## 2016-12-17 LAB — AEROBIC CULTURE W GRAM STAIN (SUPERFICIAL SPECIMEN)

## 2016-12-17 MED ORDER — VANCOMYCIN HCL 10 G IV SOLR
1750.0000 mg | INTRAVENOUS | Status: DC
  Administered 2016-12-17 – 2016-12-18 (×2): 1750 mg via INTRAVENOUS
  Filled 2016-12-17 (×2): qty 1750

## 2016-12-17 MED ORDER — LEVOFLOXACIN 750 MG PO TABS
750.0000 mg | ORAL_TABLET | Freq: Every day | ORAL | Status: DC
  Administered 2016-12-17 – 2016-12-18 (×2): 750 mg via ORAL
  Filled 2016-12-17 (×2): qty 1

## 2016-12-17 MED ORDER — DIPHENHYDRAMINE HCL 50 MG/ML IJ SOLN
25.0000 mg | Freq: Once | INTRAMUSCULAR | Status: AC
  Administered 2016-12-17: 25 mg via INTRAVENOUS
  Filled 2016-12-17: qty 1

## 2016-12-17 MED ORDER — CEFTRIAXONE SODIUM 2 G IJ SOLR
2.0000 g | INTRAMUSCULAR | Status: DC
  Filled 2016-12-17: qty 2

## 2016-12-17 NOTE — Progress Notes (Signed)
Pharmacy Antibiotic Note  Chad [name] is a 46 y.o. male with colon cancer currently undergoing chemotherapy and diabetic foot infection with 4th toe ulcer.  He recently completed a course of keflex and bactrim.  He presented to the ED on 12/11/2016 with c/o bleeding and drainage from wound site, swelling extending from toe to forefoot to mid shin.  Xray shows erosion c/w osteomyelitis.  He is s/p 4th toe amputation on 6/1 and culture is growing group B strep and MRSA.  Antibiotics are being adjusted, ID plans to send home on vancomycin with PO levofloxacin and metronidazole  Today, 12/17/2016: Day #7 total antibiotics  AKI improving, SCr was elevated at time of admission. UOP has been good  Normalized CrCl is estimated to currently = 62ml/min  WBC WNL  Plan:  Due to obesity, dose vancomycin based on normalized CrCl, vancomycin 1750mg  IV q24h. Goal vancomycin trough 15-20 mcg/mL  Renal function is borderline for needing q12h dosing  Check daily SCr at this time d/t presence of AKI  Follow-up renal function in am  Will need steady state trough and twice weekly trough and SCr monitoring  Pharmacist to evaluate renal function in am ____________________________  Height: 6\' 3"  (190.5 cm) Weight: (!) 373 lb (169.2 kg) IBW/kg (Calculated) : 84.5  Temp (24hrs), Avg:98.7 F (37.1 C), Min:98.5 F (36.9 C), Max:98.8 F (37.1 C)   Recent Labs Lab 12/11/16 1418 12/12/16 0317 12/13/16 0511 12/13/16 2142 12/14/16 0850 12/15/16 0342 12/16/16 0319 12/17/16 0438  WBC  --  10.2 9.1  --  8.0 7.4 8.1  --   CREATININE  --  1.77* 2.02*  --  1.81* 1.75* 1.64* 1.61*  LATICACIDVEN 1.50  --   --   --   --   --   --   --   VANCOTROUGH  --   --   --  15  --   --   --   --     Estimated Creatinine Clearance: 94 mL/min (A) (by C-G formula based on SCr of 1.61 mg/dL (H)).    No Known Allergies   Antimicrobials this admission:  5/30 vanc>>6/2, resume 6/5 5/30 zosyn>>6/2 6/2 doxy >>  6/3 6/3 linezolid >>6/4 6/4 ceftriaxone >> 6/5 6/4 metronidazole >> 6/5 levofloxacin >>  Dose adjustments this admission:  5/31 Vancomycin dose decreased from 1250 q12h to 1750 q24h for increasing SCr. 6/1 VT at 2130 = 15, not quite at steady state, continue 1750mg  q24h  Microbiology results:  5/30 BCx x2: ngtd 5/31 MRSA PCR: positive (chlx, bactroban) 6/1 toe wound: Grp B strep, rare MRSA  Doreene Eland, PharmD, BCPS.   Pager: [phone] 12/17/2016 3:18 PM

## 2016-12-17 NOTE — Progress Notes (Addendum)
PHARMACY CONSULT NOTE FOR:  OUTPATIENT  PARENTERAL ANTIBIOTIC THERAPY (OPAT)  Indication: osteomyelitis Regimen: vancomycin 1750mg  IV q24h  (levofloxacin 750mg  PO q24h and metronidazole 500mg  PO TID) End date: 12/30/2016  IV antibiotic discharge orders are pended. To discharging provider:  please sign these orders via discharge navigator,  Select New Orders & click on the button choice - Manage This Unsigned Work.    Thank you for allowing pharmacy to be a part of this patient's care.  Doreene Eland, PharmD, BCPS.   Pager: [phone] 12/17/2016 3:05 PM

## 2016-12-17 NOTE — Progress Notes (Signed)
Nutrition Follow-up  DOCUMENTATION CODES:   Non-severe (moderate) malnutrition in context of acute illness/injury, Morbid obesity  INTERVENTION:  - Continue to encourage PO intakes of meals, supplements, and beverages. - Continue Ensure Enlive BID and Prostat TID. - RD will continue to monitor for additional nutrition-related needs, including need for education prior to d/c.  NUTRITION DIAGNOSIS:   Malnutrition (Moderate) related to acute illness, poor appetite, cancer and cancer related treatments as evidenced by percent weight loss, energy intake < 75% for > 7 days -ongoing, intakes improving.   GOAL:   Patient will meet greater than or equal to 90% of their needs -likely unmet based on high estimated needs.  MONITOR:   PO intake, Supplement acceptance, Labs, Weight trends, Skin, I & O's  ASSESSMENT:   46 y.o. male with history of metastatic colon cancer receiving chemotherapy, diabetes mellitus uncontrolled, hypertension, morbid obesity, who had an infected ulcer of the fourth toe of the left foot about a month ago that almost completely healed with antibiotics.  After he completed the antibiotics, however, he stopped dressing the ulcer on his left foot.  For the last week he has had increased swelling of the left foot with some redness and blackness of the fourth toe that started 3 days ago. The blackness extended into the forefoot and the redness extended up to the mid shin. He denied fevers, chills, vomiting aside from what he otherwise expected with his chemotherapy. He was encouraged by his primary care doctor to come to the emergency department due to concern of developing sepsis from his left foot cellulitis.  He has had sharp constant pain of the left toes 7 out of 10 that was better after Dilaudid in the emergency department.  6/5 Pt with high estimated nutrition needs based on sepsis dx, L foot ulcer, now s/p L 4th toe debridement and amputation 2/2 osteomyelitis. Pt has  mainly been consuming 75-100% of meals and he has been accepting Ensure and Prostat supplements <75% of the time that they are offered. Pt is likely meeting estimated protein needs with meals and supplements but may not be meeting estimated kcal need based on high needs, food access may be different in the hospital they it was at home. No new weight since 5/30.   Medications reviewed; sliding scale Novolog, 25 units Lantus/day, 80 mg oral Protonix/day, 1 packet Miralax/day.  Labs reviewed; CBG: 169 mg/dL, creatinine: 1.61 mg/dL, GFR: 49 mL/min.   IVF: NS @ 75 mL/hr.   5/31 Pt reports poor appetite for a few weeks. He c/o bland tastes. Pt consumed a few bites of eggs and toast this morning for breakfast. States he doesn't have a taste for anything. He is willing to try Ensure supplements, has not been drinking protein supplements at home. Pt is also willing to try Prostat.   Per chart review, pt has lost 19 lb since 5/2 (5% wt loss x 1 month, significant for time frame). Nutrition focused physical exam shows no sign of depletion of muscle mass or body fat.   Diet Order:  Diet heart healthy/carb modified Room service appropriate? Yes; Fluid consistency: Thin  Skin:  Wound (see comment) (L foot DM ulcer with incision from 6/1)  Last BM:  5/31  Height:   Ht Readings from Last 1 Encounters:  12/11/16 6\' 3"  (1.905 m)    Weight:   Wt Readings from Last 1 Encounters:  12/11/16 (!) 373 lb (169.2 kg)    Ideal Body Weight:  89.1 kg  BMI:  Body mass index is 46.62 kg/m.  Estimated Nutritional Needs:   Kcal:  2800-3000  Protein:  135-145g  Fluid:  2.8L/day  EDUCATION NEEDS:   No education needs identified at this time    Jarome Matin, MS, RD, LDN, CNSC Inpatient Clinical Dietitian Pager # [phone] After hours/weekend pager # [phone]

## 2016-12-17 NOTE — Progress Notes (Signed)
Patient ID: Chad [name], male   DOB: 1971-04-17, 46 y.o.   MRN: [id number]          [facility]  Date of Admission:  12/11/2016   Total days of antibiotics 7         Day 2 ceftriaxone        Day 2 metronidazole          Active Problems:   Diabetic infection of left foot (HCC)   Morbid obesity (HCC)   Neuropathy due to type 2 diabetes mellitus ([place])   Cancer of left colon (HCC)   Sepsis ([place])   Metastatic cancer (HCC)   Normocytic anemia   Acute renal insufficiency   . aspirin EC  81 mg Oral Daily  . Chlorhexidine Gluconate Cloth  6 each Topical Q0600  . enoxaparin (LOVENOX) injection  80 mg Subcutaneous Q24H  . feeding supplement (ENSURE ENLIVE)  237 mL Oral BID BM  . feeding supplement (PRO-STAT SUGAR FREE 64)  30 mL Oral TID WC  . gabapentin  100 mg Oral TID  . insulin aspart  0-15 Units Subcutaneous TID WC  . insulin aspart  0-5 Units Subcutaneous QHS  . insulin glargine  25 Units Subcutaneous Daily  . metroNIDAZOLE  500 mg Oral Q8H  . pantoprazole  80 mg Oral Daily  . polyethylene glycol  17 g Oral Daily    SUBJECTIVE: He is feeling better. He is having only minimal pain in his left foot. He has been up walking today.  Review of Systems: Review of Systems  Constitutional: Negative for chills, diaphoresis, fever and malaise/fatigue.  Gastrointestinal: Negative for abdominal pain, diarrhea, nausea and vomiting.  Musculoskeletal: Positive for joint pain.  Skin: Negative for rash.    Past Medical History:  Diagnosis Date  . Cancer [facility])    colon cancer  . Coronary artery disease   . Diabetes mellitus without complication ([place]) 01/1218   oral agents only (07/2015)  . ETOH abuse 2014  . Fatty liver 12/2014   on ultrasound. pt obese and abuses ETOH  . Gallstone 12/2014   GB stones and sludge on ultrasound and CT,  . GERD (gastroesophageal reflux disease)   . Hypertension   . Morbid obesity with BMI of 50.0-59.9, adult ([place])  03/2013  . Rectal bleeding 05/2015   07/2015 colonoscopy with malignant appearing sigmoid mass. Additional rectal, descending, polyps  . Shoulder pain, bilateral    due to previous injury    Social History  Substance Use Topics  . Smoking status: Never Smoker  . Smokeless tobacco: Current User    Types: Chew     Comment: occasionally dips tobacco - has used for approx 20 yrs (12/21/15)  . Alcohol use Yes     Comment: heavy drinking for 10 years, 2-3  drinks of liqour a night, has been cutting back.      Family History  Problem Relation Age of Onset  . Diabetes Father   . Pancreatic cancer Father 63       former smoker, but had not smoked in 23 years  . Cancer Maternal Uncle        d. 24s; unknown type of cancer -metastatic throughout abdomen   . Other Brother        oldest brother has had a normal colonoscopy  . Heart attack Maternal Grandfather        d. 57-58; heavy smoker and drinker  . Heart attack Paternal Grandmother  d. early 36s  . Heart attack Paternal Grandfather 10  . Heart failure Maternal Uncle   . Skin cancer Maternal Uncle 42       NOS type; was out in the sun a lot  . Brain cancer Maternal Uncle        NOS type; mother's maternal half-brother dx. 66-68  . Cirrhosis Paternal Uncle   . Cancer Paternal Uncle        (x2-3) uncles with NOS cancers; d. 83s   No Known Allergies  OBJECTIVE: Vitals:   12/15/16 2000 12/16/16 0630 12/16/16 1559 12/17/16 1321  BP: (!) 154/65 (!) 147/65 (!) 164/71 (!) 150/73  Pulse: 65 64 70 71  Resp: '18 18 18 18  ' Temp: 98.5 F (36.9 C) 98.7 F (37.1 C) 98.8 F (37.1 C) 98.5 F (36.9 C)  TempSrc: Oral Oral Oral Oral  SpO2: 100% 100% 97% 98%  Weight:      Height:       Body mass index is 46.62 kg/m.  Physical Exam  Constitutional: He is oriented to person, place, and time.  He is talkative and in good spirits.  Pulmonary/Chest:  His Port-A-Cath is currently accessed with antibiotic infusing.  Musculoskeletal: He  exhibits edema. He exhibits no tenderness.  VAC dressing is in place on his left foot. He has 1+ pitting edema with slight warmth and redness of his left lower leg.  Neurological: He is alert and oriented to person, place, and time.  Skin: No rash noted.  Psychiatric: Mood and affect normal.    Lab Results Lab Results  Component Value Date   WBC 8.1 12/16/2016   HGB 9.8 (L) 12/16/2016   HCT 29.5 (L) 12/16/2016   MCV 96.4 12/16/2016   PLT 314 12/16/2016    Lab Results  Component Value Date   CREATININE 1.61 (H) 12/17/2016   BUN 15 12/16/2016   NA 141 12/16/2016   K 3.9 12/16/2016   CL 106 12/16/2016   CO2 27 12/16/2016    Lab Results  Component Value Date   ALT 17 12/12/2016   AST 14 (L) 12/12/2016   ALKPHOS 72 12/12/2016   BILITOT 1.3 (H) 12/12/2016     Microbiology: Recent Results (from the past 240 hour(s))  Blood culture (routine x 2)     Status: None   Collection Time: 12/11/16  2:12 PM  Result Value Ref Range Status   Specimen Description BLOOD RIGHT CHEST PORTA CATH  Final   Special Requests   Final    BOTTLES DRAWN AEROBIC AND ANAEROBIC Blood Culture adequate volume   Culture   Final    NO GROWTH 5 DAYS Performed at [facility], [address], [place], [place] [address]    Report Status 12/16/2016 FINAL  Final  Blood culture (routine x 2)     Status: None   Collection Time: 12/11/16  3:35 PM  Result Value Ref Range Status   Specimen Description BLOOD LEFT HAND  Final   Special Requests   Final    BOTTLES DRAWN AEROBIC AND ANAEROBIC Blood Culture adequate volume   Culture   Final    NO GROWTH 5 DAYS Performed at [facility], [place] [address], [place], [place] [address]    Report Status 12/16/2016 FINAL  Final  Surgical pcr screen     Status: Abnormal   Collection Time: 12/12/16  5:57 PM  Result Value Ref Range Status   MRSA, PCR POSITIVE (A) NEGATIVE Final  Comment: RESULT CALLED TO, READ BACK BY AND VERIFIED WITH: Wynelle Cleveland  [id number] @ 0032 BY J SCOTTON    Staphylococcus aureus POSITIVE (A) NEGATIVE Final    Comment:        The Xpert SA Assay (FDA approved for NASAL specimens in patients over 50 years of age), is one component of a comprehensive surveillance program.  Test performance has been validated by [facility] for patients greater than or equal to 17 year old. It is not intended to diagnose infection nor to guide or monitor treatment. RESULT CALLED TO, READ BACK BY AND VERIFIED WITH: Wynelle Cleveland [id number] @ 0032 BY J SCOTTON   Aerobic Culture (superficial specimen)     Status: None   Collection Time: 12/13/16  6:30 PM  Result Value Ref Range Status   Specimen Description WOUND LEFT FOURTH TOE AMPUTATION  Final   Special Requests NONE  Final   Gram Stain   Final    RARE WBC PRESENT, PREDOMINANTLY PMN RARE GRAM NEGATIVE RODS RARE GRAM POSITIVE COCCI IN PAIRS Performed at [facility], [place] [address], [place], [place] [address]    Culture   Final    FEW GROUP B STREP(S.AGALACTIAE)ISOLATED TESTING AGAINST S. AGALACTIAE NOT ROUTINELY PERFORMED DUE TO PREDICTABILITY OF AMP/PEN/VAN SUSCEPTIBILITY. RARE STAPHYLOCOCCUS AUREUS    Report Status 12/17/2016 FINAL  Final   Organism ID, Bacteria STAPHYLOCOCCUS AUREUS  Final      Susceptibility   Staphylococcus aureus - MIC*    CIPROFLOXACIN >=8 RESISTANT Resistant     ERYTHROMYCIN >=8 RESISTANT Resistant     GENTAMICIN <=0.5 SENSITIVE Sensitive     OXACILLIN >=4 RESISTANT Resistant     TETRACYCLINE <=1 SENSITIVE Sensitive     TRIMETH/SULFA <=10 SENSITIVE Sensitive     CLINDAMYCIN >=8 RESISTANT Resistant     RIFAMPIN <=0.5 SENSITIVE Sensitive     * RARE STAPHYLOCOCCUS AUREUS     ASSESSMENT: He has polymicrobial infection of his left foot. The operative Gram stain shows gram-negative rods and gram-positive cocci. Cultures have grown group B strep and are now also reported to be growing rare MRSA. I will change his regimen to IV vancomycin  and oral levofloxacin and metronidazole to cover a mixed aerobic and anaerobic infection. His renal function is improving but will need to be watched very closely on vancomycin. I plan on at least 2 weeks of postoperative therapy with optimal duration dependent on wound healing.  Diagnosis: Left a diabetic foot infection  Culture Result: Group B strep and MRSA  No Known Allergies  Discharge antibiotics: Per pharmacy protocol IV vancomycin, oral levofloxacin and oral metronidazole Aim for Vancomycin trough 15-20 (unless otherwise indicated) Duration: Weeks End Date: 12/31/2016  [facility] Care Per Protocol:  Labs TWICE weekly while on IV antibiotics: _x_ CBC with differential _x_ BMP __ CMP _x_ CRP _x_ ESR _x_ Vancomycin trough  __ Please pull PIC at completion of IV antibiotics _x_ Please leave PIC in place until doctor has seen patient or been notified  Fax weekly labs to [phone]  Clinic Follow Up Appt: At [place] with me on 12/31/2016  @   PLAN: 1. Restart IV vancomycin per protocol 2. Start levofloxacin 750 mg by mouth daily 3. Continue metronidazole 4. Discontinue ceftriaxone 5. I will arrange follow-up in my clinic in 2 weeks  Michel Bickers, MD [facility] for [place] [phone] pager   [phone] cell 12/17/2016, 2:47 PM

## 2016-12-17 NOTE — Progress Notes (Signed)
Pt called NT and RN due to having irritation from a rash that's on both arms and his stomach. When RN asked patient when this started, he replied "I've had this for a few days now and just didn't want to say anything." RN let on-call MD know of situation, giving benadryl for the itching. May need to assess patient's medications, only thing received during the day IV was his dilaudid and vanc. Pt says it might be his gabapentin but he isn't sure. He said that he normally doesn't take that but has again since being in the hospital.  Carmela Hurt, RN 12/17/16 11:14 PM

## 2016-12-17 NOTE — Progress Notes (Addendum)
[facility] PROGRESS NOTE  Chad [name] GHW:[id number] DOB: 1970-10-14 DOA: 12/11/2016 PCP: Shawnee Knapp, MD  Interim summary and HPI 46 y.o. male with history of metastatic colon cancer receiving chemotherapy, diabetes mellitus uncontrolled, hypertension, morbid obesity, who had an infected ulcer of the fourth toe of the left foot about a month ago that almost completely healed with antibiotics.  After he completed the antibiotics, however, he stopped dressing the ulcer on his left foot.  For the last week he has had increased swelling of the left foot with some redness and blackness of the fourth toe that started 3 days ago. The blackness extended into the forefoot and the redness extended up to the mid shin. He denied fevers, chills, vomiting aside from what he otherwise expected with his chemotherapy. He was encouraged by his primary care doctor to come to the emergency department due to concern of developing sepsis from his left foot cellulitis.  He has had sharp constant pain of the left toes 7 out of 10 that was better after Dilaudid in the emergency department. Admitted with sepsis (tachycardic, elevated WBC's and x-ray findings suggesting osteomyelitis.  Assessment/Plan: 1-sepsis: due to left foot cellulitis and left fourth toe osteomyelitis  -Sepsis features completely resolved. Patient afebrile, normal heart rate, normal wbc's and no tachypnea. -still with some intermittent pain on his foot; will continue PRN analgesics and continue neurontin. -due to findings of seropurulent discharge while changing dressings/woundvac; orthopedic service has consulted ID and will like to pursuit IV antibiotics regimen at discharge to guaranteed clearance of infection.  -OR cultures demonstrated MRSA and strep B -plan is to treat with flagyl, levaquin and IV vancomycin (looking to treat until 6/19) -OPAT in place and patient will follow up with ID service at discharge  -continue PRN analgesics  and supportive care -wound vac and dressings, to be exchanged on 6/6 again.  2-hyponatremia, hypokalemia and AKI -appears to be secondary to prerenal azotemia and secondary intravascular depletion/impair perfusion from sepsis.  -Also secondary to continue use of nephrotoxic agents (lisinopril and HCTZ) contributing to his acute kidney injury -Will continue holding nephrotoxic agents -Creatinine steadily improving; will monitor closely now that vancomycin will be restarted  -Sodium level back to normal now, will monitor trend. -potassium repleted, will monitor  3-metastatic colon cancer -Stable overall.  -Continue outpatient follow-up and further treatment as per oncology service recommendation.   4-HTN -BP stable -slight rise probably associated with pain -given the fact Cr still not back to normal and vancomycin will restarted, will continue holding HCTZ and lisinopril -will continue carvedilol and continue using PRN hydralazine  5-diabetes mellitus with diabetic foot ulcer. -A1c 7.6 -while inpatient will continue holding oral hypoglycemic regimen -continue SSI and Lantus -continue modified carb diet  6-hx of CAD -no CP and no SOB -continue ASA and B-blocker -telemetry and EKG w/o ischemia; tele discontinued on 6/4  7-GERD -continue PPI -Discussed with patient about physical measures to improve symptom control (sitting upright while eating and staying upright after finishing his meals at least for 40-60 minutes, increase height on the head of the bed and minimizing daily consumption of spicy/over seasoning foods and carbonated substances.)  8-acute blood loss anemia: postoperative -table -no signs of bleeding  -will monitor trend  -Most likely secondary to blood loss during amputation   9-Morbid obesity -Body mass index is 46.62 kg/m. -low calorie diet and exercise discussed with patient.  Code Status: Full code Family Communication: no family at bedside  Disposition  Plan: patient  s/p amputation and debridement of his left 4 toe due to osteomyelitis; sepsis features resolved and source of infection partially removed; still with some purulent discharge appreciated when changing dressings. ID consulted for antibiotic orientation. Cultures from OR back and demonstrating MRSA and Strep B, will use oral levaquin and flagyl and IV vancomycin (anticipated last day of abx's 6/19). Changing dressings again on 6/6 and if stable home with IV antibiotics.  Consultants:  Orthopedic service (Dr. Lorin Mercy)  ID  Procedures:  Left 4th toe amputation and debridement 6/1  Antibiotics:  Zosyn 5/30>>>6/2  Vancomycin 5/30>>6/2; now restart on 6/5  Doxycycline 6/2>>>6/3  Linezolid 6/3>>6/4  Rocephin 6/4>>6/5  Flagyl 6/4  HPI/Subjective: Overall feeling good. Denies diarrhea. Patient w/o CP, SOB, abd pain, nausea or vomiting. Still with some pain in his left foot.  Objective: Vitals:   12/18/16 0537 12/18/16 1357  BP: (!) 175/67 (!) 180/89  Pulse: 66 69  Resp: 18 20  Temp: 98.2 F (36.8 C) 98.7 F (37.1 C)    Intake/Output Summary (Last 24 hours) at 12/18/16 1733 Last data filed at 12/18/16 1630  Gross per 24 hour  Intake           2562.5 ml  Output             1400 ml  Net           1162.5 ml   Filed Weights   12/11/16 1259 12/11/16 1804  Weight: (!) 169.2 kg (373 lb) (!) 169.2 kg (373 lb)    Exam:   General: afebrile, in no major distress. Morbidly obese and denying CP and SOB. Patient with some pain in his left foot, but overall feeling better and improving. Wound Vac in place.  Cardiovascular:  S1 and S2, no rubs, no gallops, no murmurs.  Respiratory:  Good air movement, no wheezing, no crackles, normal effort and not using accessory muscles.  Abdomen: obese, soft, NT, ND, positive BS  Musculoskeletal:  Left foot is less swollen and with resolving erythema; wound-vac in place and clean dressings on top. RLE without acute  abnormalities.  Data Reviewed: Basic Metabolic Panel:  Recent Labs Lab 12/13/16 0511 12/14/16 0850 12/15/16 0342 12/16/16 0319 12/17/16 0438 12/18/16 0435  NA 134* 137 139 141  --  143  K 4.3 4.3 4.1 3.9  --  3.7  CL 101 104 105 106  --  109  CO2 25 26 29 27   --  28  GLUCOSE 169* 212* 208* 208*  --  140*  BUN 19 16 16 15   --  15  CREATININE 2.02* 1.81* 1.75* 1.64* 1.61* 1.70*  CALCIUM 8.0* 8.1* 8.3* 8.5*  --  8.4*   Liver Function Tests:  Recent Labs Lab 12/12/16 0317  AST 14*  ALT 17  ALKPHOS 72  BILITOT 1.3*  PROT 6.7  ALBUMIN 3.0*   CBC:  Recent Labs Lab 12/12/16 0317 12/13/16 0511 12/14/16 0850 12/15/16 0342 12/16/16 0319  WBC 10.2 9.1 8.0 7.4 8.1  NEUTROABS  --   --  4.1  --   --   HGB 11.3* 10.2* 10.2* 9.7* 9.8*  HCT 32.6* 29.8* 30.7* 29.2* 29.5*  MCV 94.2 94.0 94.8 96.4 96.4  PLT 331 273 309 306 314   CBG:  Recent Labs Lab 12/17/16 1741 12/17/16 2230 12/18/16 0725 12/18/16 1125 12/18/16 1634  GLUCAP 171* 140* 134* 151* 121*    Recent Results (from the past 240 hour(s))  Blood culture (routine x 2)     Status:  None   Collection Time: 12/11/16  2:12 PM  Result Value Ref Range Status   Specimen Description BLOOD RIGHT CHEST PORTA CATH  Final   Special Requests   Final    BOTTLES DRAWN AEROBIC AND ANAEROBIC Blood Culture adequate volume   Culture   Final    NO GROWTH 5 DAYS Performed at [facility], [address] [place], [place] [address]    Report Status 12/16/2016 FINAL  Final  Blood culture (routine x 2)     Status: None   Collection Time: 12/11/16  3:35 PM  Result Value Ref Range Status   Specimen Description BLOOD LEFT HAND  Final   Special Requests   Final    BOTTLES DRAWN AEROBIC AND ANAEROBIC Blood Culture adequate volume   Culture   Final    NO GROWTH 5 DAYS Performed at [facility], [place] [address], [place], [place] [address]    Report Status 12/16/2016 FINAL  Final  Surgical pcr screen     Status:  Abnormal   Collection Time: 12/12/16  5:57 PM  Result Value Ref Range Status   MRSA, PCR POSITIVE (A) NEGATIVE Final    Comment: RESULT CALLED TO, READ BACK BY AND VERIFIED WITH: Wynelle Cleveland [id number] @ 0032 BY J SCOTTON    Staphylococcus aureus POSITIVE (A) NEGATIVE Final    Comment:        The Xpert SA Assay (FDA approved for NASAL specimens in patients over 66 years of age), is one component of a comprehensive surveillance program.  Test performance has been validated by [facility] for patients greater than or equal to 83 year old. It is not intended to diagnose infection nor to guide or monitor treatment. RESULT CALLED TO, READ BACK BY AND VERIFIED WITH: Wynelle Cleveland [id number] @ 0032 BY J SCOTTON   Aerobic Culture (superficial specimen)     Status: None   Collection Time: 12/13/16  6:30 PM  Result Value Ref Range Status   Specimen Description WOUND LEFT FOURTH TOE AMPUTATION  Final   Special Requests NONE  Final   Gram Stain   Final    RARE WBC PRESENT, PREDOMINANTLY PMN RARE GRAM NEGATIVE RODS RARE GRAM POSITIVE COCCI IN PAIRS Performed at [facility], [place] [address], [place], [place] [address]    Culture   Final    FEW GROUP B STREP(S.AGALACTIAE)ISOLATED TESTING AGAINST S. AGALACTIAE NOT ROUTINELY PERFORMED DUE TO PREDICTABILITY OF AMP/PEN/VAN SUSCEPTIBILITY. RARE STAPHYLOCOCCUS AUREUS    Report Status 12/17/2016 FINAL  Final   Organism ID, Bacteria STAPHYLOCOCCUS AUREUS  Final      Susceptibility   Staphylococcus aureus - MIC*    CIPROFLOXACIN >=8 RESISTANT Resistant     ERYTHROMYCIN >=8 RESISTANT Resistant     GENTAMICIN <=0.5 SENSITIVE Sensitive     OXACILLIN >=4 RESISTANT Resistant     TETRACYCLINE <=1 SENSITIVE Sensitive     TRIMETH/SULFA <=10 SENSITIVE Sensitive     CLINDAMYCIN >=8 RESISTANT Resistant     RIFAMPIN <=0.5 SENSITIVE Sensitive     * RARE STAPHYLOCOCCUS AUREUS     Studies: No results found.  Scheduled Meds: . aspirin EC  81 mg  Oral Daily  . clotrimazole   Topical BID  . enoxaparin (LOVENOX) injection  80 mg Subcutaneous Q24H  . feeding supplement (ENSURE ENLIVE)  237 mL Oral BID BM  . feeding supplement (PRO-STAT SUGAR FREE 64)  30 mL Oral TID WC  . gabapentin  100 mg Oral TID  .  insulin aspart  0-15 Units Subcutaneous TID WC  . insulin aspart  0-5 Units Subcutaneous QHS  . insulin glargine  25 Units Subcutaneous Daily  . levofloxacin  750 mg Oral Daily  . metroNIDAZOLE  500 mg Oral Q8H  . pantoprazole  80 mg Oral Daily  . polyethylene glycol  17 g Oral Daily   Continuous Infusions: . sodium chloride 75 mL/hr at 12/18/16 1635  . vancomycin 1,750 mg (12/18/16 1633)     Time spent: 25 minutes   Cyana Shook, [facility] Pager [phone]. If 7PM-7AM, please contact night-coverage at [email or web address], password [facility] 12/18/2016, 5:33 PM  LOS: 7 days

## 2016-12-17 NOTE — Progress Notes (Signed)
PHARMACY CONSULT: Lovenox for VTE prophylaxis (and ceftriaxone dose adjustment)  Wt: 169.2 kg BMI:  46.6 Scr:  1.61 CrCl >30 ml/hr  H/H: 9.8/29.5 Pltc: 314 No bleeding or postop complications reported.  A/P:  Due to morbid obesity, continue weight-adjusted lovenox 0.5mg /kg (80 mg) sq q24h Monitor CBC and renal function, adjust as needed Monitor for bleeding and postop complications. No further dose adjustment required, pharmacy to follow peripherally Based on BMI and osteomyelitis, increase ceftriaxone to 2gm IV q24h  Doreene Eland, PharmD, BCPS.   Pager: [phone] 12/17/2016 8:41 AM

## 2016-12-18 DIAGNOSIS — D649 Anemia, unspecified: Secondary | ICD-10-CM

## 2016-12-18 DIAGNOSIS — N289 Disorder of kidney and ureter, unspecified: Secondary | ICD-10-CM

## 2016-12-18 LAB — GLUCOSE, CAPILLARY
Glucose-Capillary: 134 mg/dL — ABNORMAL HIGH (ref 65–99)
Glucose-Capillary: 151 mg/dL — ABNORMAL HIGH (ref 65–99)
Glucose-Capillary: 121 mg/dL — ABNORMAL HIGH (ref 65–99)

## 2016-12-18 LAB — BASIC METABOLIC PANEL
Anion gap: 6 (ref 5–15)
BUN: 15 mg/dL (ref 6–20)
CO2: 28 mmol/L (ref 22–32)
Calcium: 8.4 mg/dL — ABNORMAL LOW (ref 8.9–10.3)
Chloride: 109 mmol/L (ref 101–111)
Creatinine, Ser: 1.7 mg/dL — ABNORMAL HIGH (ref 0.61–1.24)
GFR calc Af Amer: 53 mL/min — ABNORMAL LOW (ref >60)
GFR calc non Af Amer: 46 mL/min — ABNORMAL LOW (ref >60)
Glucose, Bld: 140 mg/dL — ABNORMAL HIGH (ref 65–99)
Potassium: 3.7 mmol/L (ref 3.5–5.1)
Sodium: 143 mmol/L (ref 135–145)

## 2016-12-18 MED ORDER — SODIUM CHLORIDE 0.9 % IV SOLN
INTRAVENOUS | Status: DC
  Administered 2016-12-18: 17:00:00 via INTRAVENOUS

## 2016-12-18 MED ORDER — PEN NEEDLES 32G X 4 MM MISC: 1.0000 | Freq: Every day | 0 refills | Status: DC

## 2016-12-18 MED ORDER — DIPHENHYDRAMINE HCL 25 MG PO CAPS: 25.0000 mg | ORAL_CAPSULE | ORAL | Status: DC | PRN

## 2016-12-18 MED ORDER — CLOTRIMAZOLE 1 % EX CREA: TOPICAL_CREAM | Freq: Two times a day (BID) | CUTANEOUS | 0 refills | Status: AC

## 2016-12-18 MED ORDER — METRONIDAZOLE 500 MG PO TABS: 500.0000 mg | ORAL_TABLET | Freq: Three times a day (TID) | ORAL | Status: DC

## 2016-12-18 MED ORDER — INSULIN GLARGINE 100 UNIT/ML SOLOSTAR PEN: 25.0000 [IU] | PEN_INJECTOR | Freq: Every day | SUBCUTANEOUS | 0 refills | Status: DC

## 2016-12-18 MED ORDER — OXYCODONE-ACETAMINOPHEN 5-325 MG PO TABS: 1.0000 | ORAL_TABLET | Freq: Four times a day (QID) | ORAL | 0 refills | Status: AC | PRN

## 2016-12-18 MED ORDER — HYDROXYZINE HCL 10 MG PO TABS
10.0000 mg | ORAL_TABLET | Freq: Three times a day (TID) | ORAL | Status: DC | PRN
  Filled 2016-12-18: qty 1

## 2016-12-18 MED ORDER — CLOTRIMAZOLE 1 % EX CREA
TOPICAL_CREAM | Freq: Two times a day (BID) | CUTANEOUS | Status: DC
  Administered 2016-12-18: 14:00:00 via TOPICAL
  Filled 2016-12-18: qty 15

## 2016-12-18 MED ORDER — VANCOMYCIN IV (FOR PTA / DISCHARGE USE ONLY): 1750.0000 mg | INTRAVENOUS | 0 refills | Status: DC

## 2016-12-18 MED ORDER — HEPARIN SOD (PORK) LOCK FLUSH 100 UNIT/ML IV SOLN
500.0000 [IU] | INTRAVENOUS | Status: AC | PRN
  Administered 2016-12-18: 500 [IU]

## 2016-12-18 MED ORDER — AMLODIPINE BESYLATE 5 MG PO TABS: 5.0000 mg | ORAL_TABLET | Freq: Every day | ORAL | 0 refills | Status: DC

## 2016-12-18 MED ORDER — LEVOFLOXACIN 750 MG PO TABS: 750.0000 mg | ORAL_TABLET | Freq: Every day | ORAL | Status: DC

## 2016-12-18 MED ORDER — OXYCODONE-ACETAMINOPHEN 5-325 MG PO TABS
1.0000 | ORAL_TABLET | ORAL | Status: DC | PRN
  Administered 2016-12-18: 1 via ORAL
  Filled 2016-12-18 (×2): qty 1

## 2016-12-18 NOTE — Discharge Summary (Addendum)
Physician Discharge Summary  ASCHER SCHROEPFER ENI:[id number] DOB: 08/25/1970 DOA: 12/11/2016  PCP: Shawnee Knapp, MD  Admit date: 12/11/2016 Discharge date: 12/18/2016  Admitted From: Home Disposition:  Home  Recommendations for Outpatient Follow-up:  1. Follow up with PCP in 1-2 weeks 2. Follow up with Dr. Lorin Mercy as scheduled 3. Recommend repeat BMET in 1 week, focus on renal function 4. Please note that patient started on norvasc. Please titrate BP meds with goal of normal BP 5. Please de-access port after completing IV antibiotic  NCCSR reviewed: Most recent narcotics were prescribed on 09/24/16 for 3 day supply  Home Health:  Equipment/Devices:IV antibiotics    Discharge Condition:Improved CODE STATUS:Full Diet recommendation: Diabetic   Brief/Interim Summary: 46 y.o.malewith history of metastatic colon cancer receiving chemotherapy, diabetes mellitus uncontrolled, hypertension, morbid obesity, who had an infected ulcer of the fourth toe of the left foot about a month ago that almost completely healed with antibiotics. After he completed the antibiotics, however, he stopped dressing the ulcer on his left foot. For the last week he has had increased swelling of the left foot with some redness and blackness of the fourth toe that started 3 days ago. The blackness extended into the forefoot and the redness extended up to the mid shin. He denied fevers, chills, vomiting aside from what he otherwise expected with his chemotherapy. He was encouraged by his primary care doctor to come to the emergency department due to concern of developing sepsis from his left foot cellulitis. He has had sharp constant pain of the left toes 7 out of 10 that was better after Dilaudid in the emergency department. Admitted with sepsis (tachycardic, elevated WBC's and x-ray findings suggesting osteomyelitis.  1-sepsis: due to left foot cellulitis and left fourth toe osteomyelitis  -Sepsis features completely  resolved. Patient afebrile, normal heart rate, normal wbc's and no tachypnea. -still with some intermittent pain on his foot; will continue PRN analgesics -due to findings of seropurulent discharge while changing dressings/woundvac; orthopedic service has consulted ID -OR cultures demonstrated MRSA and strep B -plan is to treat with flagyl, levaquin and IV vancomycin (looking to treat until 6/19) -OPAT in place and patient will follow up with ID service at discharge  -continue PRN analgesics and supportive care -wound vac and dressings per [facility] RN  2-hyponatremia, hypokalemia and AKI -appears to be secondary to prerenal azotemia and secondary intravascular depletion/impair perfusion from sepsis.  -Also secondary to continue use of nephrotoxic agents (lisinopril and HCTZ) contributing to his acute kidney injury - nephrotoxic agents were placed on hold -Creatinine steadily improving; will monitor closely now that vancomycin will be restarted  -Sodium level back to normal now  3-metastatic colon cancer -Stable overall.  -Continue outpatient follow-up and further treatment as per oncology service recommendation.   4-HTN -BP stable -slight rise probably associated with pain -will continue carvedilol -Hold lisinopril/hctz per above - Will prescribe norvasc on discharge, continue to titrate BP as tolerated  5-diabetes mellitus with diabetic foot ulcer. -A1c 7.6 -while inpatient will continue holding oral hypoglycemic regimen -continue SSI and Lantus -continued modified carb diet  6-hx of CAD -no CP and no SOB -continue ASA and B-blocker -telemetry and EKG w/o ischemia; tele discontinued on 6/4  7-GERD -continue PPI -Discussed with patient about physical measures to improve symptom control (sitting upright while eating and staying upright after finishing his meals at least for 40-60 minutes, increase height on the head of the bed and minimizing daily consumption of spicy/over  seasoning foods  and carbonated substances.)  8-acute blood loss anemia: postoperative -table -no signs of bleeding  -will monitor trend  -Most likely secondary to blood loss during amputation   9-Morbid obesity -Body mass index is 46.62 kg/m. -low calorie diet and exercise discussed with patient.  Discharge Diagnoses:  Active Problems:   Morbid obesity ([place])   Neuropathy due to type 2 diabetes mellitus ([place])   Cancer of left colon ([place])   Diabetic infection of left foot ([place])   Sepsis ([place])   Metastatic cancer ([place])   Normocytic anemia   Acute renal insufficiency    Discharge Instructions  Discharge Instructions    Home infusion instructions [facility] May follow [facility] Dosing Protocol; May administer Cathflo as needed to maintain patency of vascular access device.; Flushing of vascular access device: per [facility] Protocol: 0.9% NaCl pre/post medica...    Complete by:  As directed    Instructions:  May follow [place] Dosing Protocol   Instructions:  May administer Cathflo as needed to maintain patency of vascular access device.   Instructions:  Flushing of vascular access device: per [facility] Protocol: 0.9% NaCl pre/post medication administration and prn patency; Heparin 100 u/ml, 44m for implanted ports and Heparin 10u/ml, 514mfor all other central venous catheters.   Instructions:  May follow [facility] Anaphylaxis Protocol for First Dose Administration in the home: 0.9% NaCl at 25-50 ml/hr to maintain IV access for protocol meds. Epinephrine 0.3 ml IV/IM PRN and Benadryl 25-50 IV/IM PRN s/s of anaphylaxis.   Instructions:  [place] [facility] Coordinator (RN) to assist per patient IV care needs in the home PRN.     Allergies as of 12/18/2016   No Known Allergies     Medication List    STOP taking these medications   lisinopril-hydrochlorothiazide 20-25 MG tablet Commonly known as:  PRINZIDE,ZESTORETIC     TAKE these medications   acetaminophen 500 MG  tablet Commonly known as:  TYLENOL Take 1,000 mg by mouth every 6 (six) hours as needed for moderate pain or headache.   amLODipine 5 MG tablet Commonly known as:  NORVASC Take 1 tablet (5 mg total) by mouth daily.   aspirin EC 81 MG tablet Take 1 tablet (81 mg total) by mouth daily. DO NOT TAKE AGAIN UNTIL 08/09/15   blood glucose meter kit and supplies Kit Dispense based on patient and insurance preference. Use up to four times daily as directed. (FOR ICD-9 250.00, 250.01).   carvedilol 20 MG 24 hr capsule Commonly known as:  COREG CR Take 20 mg by mouth daily.   clotrimazole 1 % cream Commonly known as:  LOTRIMIN Apply topically 2 (two) times daily.   diphenoxylate-atropine 2.5-0.025 MG tablet Commonly known as:  LOMOTIL Take 1-2 tablets by mouth 4 (four) times daily as needed for diarrhea or loose stools.   glucose blood test strip One touch strips test bid Dx. dm   Insulin Glargine 100 UNIT/ML Solostar Pen Commonly known as:  LANTUS Inject 25 Units into the skin daily at 10 pm. What changed:  how much to take  how to take this  when to take this  additional instructions   levofloxacin 750 MG tablet Commonly known as:  LEVAQUIN Take 1 tablet (750 mg total) by mouth daily. Start taking on:  12/19/2016   lidocaine-prilocaine cream Commonly known as:  EMLA Apply 1 application topically as needed.   metroNIDAZOLE 500 MG tablet Commonly known as:  FLAGYL Take 1 tablet (500 mg total) by  mouth every 8 (eight) hours.   omeprazole 40 MG capsule Commonly known as:  PRILOSEC TAKE 1 CAPSULE(40 MG) BY MOUTH DAILY   ondansetron 8 MG tablet Commonly known as:  ZOFRAN Take 1 tablet (8 mg total) by mouth every 8 (eight) hours as needed for nausea or vomiting.   oxyCODONE-acetaminophen 5-325 MG tablet Commonly known as:  PERCOCET/ROXICET Take 1 tablet by mouth every 6 (six) hours as needed for moderate pain.   Pen Needles 32G X 4 MM Misc 1 Units by Does not apply  route daily.   prochlorperazine 10 MG tablet Commonly known as:  COMPAZINE Take 1 tablet (10 mg total) by mouth every 6 (six) hours as needed for nausea or vomiting.   sitaGLIPtin-metformin 50-1000 MG tablet Commonly known as:  JANUMET Take 1 tablet by mouth 2 (two) times daily with a meal. What changed:  how much to take  when to take this   vancomycin IVPB Inject 1,750 mg into the vein daily. Indication:  osteomyelitis Last Day of Therapy:  12/31/2016 Labs - 'Sunday/Monday:  CBC/D, BMP, ESR, CRP, and vancomycin trough. Labs - Thursday:  CBC/D, BMP, ESR, CRP, and vancomycin trough   vitamin C 1000 MG tablet Take 1,000 mg by mouth daily.            Home Infusion Instuctions        Start     Ordered   12/18/16 0000  Home infusion instructions [facility] May follow [facility] Dosing Protocol; May administer Cathflo as needed to maintain patency of vascular access device.; Flushing of vascular access device: per [facility] Protocol: 0.9% NaCl pre/post medica...    Question Answer Comment  Instructions May follow [facility] Dosing Protocol   Instructions May administer Cathflo as needed to maintain patency of vascular access device.   Instructions Flushing of vascular access device: per [facility] Protocol: 0.9% NaCl pre/post medication administration and prn patency; Heparin 100 u/ml, 5ml for implanted ports and Heparin 10u/ml, 5ml for all other central venous catheters.   Instructions May follow [facility] Anaphylaxis Protocol for First Dose Administration in the home: 0.9% NaCl at 25-50 ml/hr to maintain IV access for protocol meds. Epinephrine 0.3 ml IV/IM PRN and Benadryl 25-50 IV/IM PRN s/s of anaphylaxis.   Instructions [facility] Infusion Coordinator (RN) to assist per patient IV care needs in the home PRN.      06' /06/18 1651     Follow-up Information    [facility]-Home Follow up.   Why:  HH nursing-iv abx, teaching Contact information: [address] [place] [address] [phone]        Marybelle Killings, MD Follow up in 1 week(s).   Specialty:  Orthopedic Surgery Contact information: [place] [address] [phone]        Shawnee Knapp, MD. Schedule an appointment as soon as possible for a visit in 1 week(s).   Specialty:  Family Medicine Contact information: [place] [address] [phone]          No Known Allergies  Consultations:  ID  Orthopedic Surgery  Procedures/Studies: Dg Toe 4th Left  Result Date: 12/11/2016 CLINICAL DATA:  Fourth toe pain and discomfort EXAM: LEFT FOURTH TOE COMPARISON:  11/05/2016 FINDINGS: Destructive changes are now seen distal aspect of the fourth proximal phalanx consistent with osteomyelitis. Soft tissue swelling of the fourth digit is noted. No other focal abnormality is seen. IMPRESSION: Erosive changes in the distal  aspect of the fourth proximal phalanx consistent with osteomyelitis. Electronically Signed   By: Inez Catalina M.D.   On: 12/11/2016 14:49    Subjective: Eager to go home  Discharge Exam: Vitals:   12/18/16 0537 12/18/16 1357  BP: (!) 175/67 (!) 180/89  Pulse: 66 69  Resp: 18 20  Temp: 98.2 F (36.8 C) 98.7 F (37.1 C)   Vitals:   12/17/16 1321 12/17/16 2050 12/18/16 0537 12/18/16 1357  BP: (!) 150/73 (!) 168/84 (!) 175/67 (!) 180/89  Pulse: 71 72 66 69  Resp: '18 18 18 20  ' Temp: 98.5 F (36.9 C) 98.9 F (37.2 C) 98.2 F (36.8 C) 98.7 F (37.1 C)  TempSrc: Oral Oral Oral Oral  SpO2: 98% 100% 98% 94%  Weight:      Height:        General: Pt is alert, awake, not in acute distress Cardiovascular: RRR, S1/S2 +, no rubs, no gallops Respiratory: CTA bilaterally, no wheezing, no rhonchi Abdominal: Soft, NT, ND, bowel sounds + Extremities: no edema, no cyanosis   The results of significant diagnostics from this hospitalization (including imaging, microbiology, ancillary and laboratory) are listed  below for reference.     Microbiology: Recent Results (from the past 240 hour(s))  Blood culture (routine x 2)     Status: None   Collection Time: 12/11/16  2:12 PM  Result Value Ref Range Status   Specimen Description BLOOD RIGHT CHEST PORTA CATH  Final   Special Requests   Final    BOTTLES DRAWN AEROBIC AND ANAEROBIC Blood Culture adequate volume   Culture   Final    NO GROWTH 5 DAYS Performed at [facility], [address], [place], [place] [address]    Report Status 12/16/2016 FINAL  Final  Blood culture (routine x 2)     Status: None   Collection Time: 12/11/16  3:35 PM  Result Value Ref Range Status   Specimen Description BLOOD LEFT HAND  Final   Special Requests   Final    BOTTLES DRAWN AEROBIC AND ANAEROBIC Blood Culture adequate volume   Culture   Final    NO GROWTH 5 DAYS Performed at [facility], [place] [address], [place], [place] [address]    Report Status 12/16/2016 FINAL  Final  Surgical pcr screen     Status: Abnormal   Collection Time: 12/12/16  5:57 PM  Result Value Ref Range Status   MRSA, PCR POSITIVE (A) NEGATIVE Final    Comment: RESULT CALLED TO, READ BACK BY AND VERIFIED WITH: Wynelle Cleveland [id number] @ 0032 BY J SCOTTON    Staphylococcus aureus POSITIVE (A) NEGATIVE Final    Comment:        The Xpert SA Assay (FDA approved for NASAL specimens in patients over 18 years of age), is one component of a comprehensive surveillance program.  Test performance has been validated by [facility] for patients greater than or equal to 101 year old. It is not intended to diagnose infection nor to guide or monitor treatment. RESULT CALLED TO, READ BACK BY AND VERIFIED WITH: Wynelle Cleveland [id number] @ 0032 BY J SCOTTON   Aerobic Culture (superficial specimen)     Status: None   Collection Time: 12/13/16  6:30 PM  Result Value Ref Range Status   Specimen Description WOUND LEFT FOURTH TOE AMPUTATION  Final   Special Requests NONE  Final   Gram Stain    Final    RARE WBC PRESENT, PREDOMINANTLY  PMN RARE GRAM NEGATIVE RODS RARE GRAM POSITIVE COCCI IN PAIRS Performed at [facility], [place] [address] [place], [place] [address]    Culture   Final    FEW GROUP B STREP(S.AGALACTIAE)ISOLATED TESTING AGAINST S. AGALACTIAE NOT ROUTINELY PERFORMED DUE TO PREDICTABILITY OF AMP/PEN/VAN SUSCEPTIBILITY. RARE STAPHYLOCOCCUS AUREUS    Report Status 12/17/2016 FINAL  Final   Organism ID, Bacteria STAPHYLOCOCCUS AUREUS  Final      Susceptibility   Staphylococcus aureus - MIC*    CIPROFLOXACIN >=8 RESISTANT Resistant     ERYTHROMYCIN >=8 RESISTANT Resistant     GENTAMICIN <=0.5 SENSITIVE Sensitive     OXACILLIN >=4 RESISTANT Resistant     TETRACYCLINE <=1 SENSITIVE Sensitive     TRIMETH/SULFA <=10 SENSITIVE Sensitive     CLINDAMYCIN >=8 RESISTANT Resistant     RIFAMPIN <=0.5 SENSITIVE Sensitive     * RARE STAPHYLOCOCCUS AUREUS     Labs: BNP (last 3 results) No results for input(s): BNP in the last 8760 hours. Basic Metabolic Panel:  Recent Labs Lab 12/13/16 0511 12/14/16 0850 12/15/16 0342 12/16/16 0319 12/17/16 0438 12/18/16 0435  NA 134* 137 139 141  --  143  K 4.3 4.3 4.1 3.9  --  3.7  CL 101 104 105 106  --  109  CO2 '25 26 29 27  ' --  28  GLUCOSE 169* 212* 208* 208*  --  140*  BUN '19 16 16 15  ' --  15  CREATININE 2.02* 1.81* 1.75* 1.64* 1.61* 1.70*  CALCIUM 8.0* 8.1* 8.3* 8.5*  --  8.4*   Liver Function Tests:  Recent Labs Lab 12/12/16 0317  AST 14*  ALT 17  ALKPHOS 72  BILITOT 1.3*  PROT 6.7  ALBUMIN 3.0*   No results for input(s): LIPASE, AMYLASE in the last 168 hours. No results for input(s): AMMONIA in the last 168 hours. CBC:  Recent Labs Lab 12/12/16 0317 12/13/16 0511 12/14/16 0850 12/15/16 0342 12/16/16 0319  WBC 10.2 9.1 8.0 7.4 8.1  NEUTROABS  --   --  4.1  --   --   HGB 11.3* 10.2* 10.2* 9.7* 9.8*  HCT 32.6* 29.8* 30.7* 29.2* 29.5*  MCV 94.2 94.0 94.8 96.4 96.4  PLT 331 273 309 306 314    Cardiac Enzymes: No results for input(s): CKTOTAL, CKMB, CKMBINDEX, TROPONINI in the last 168 hours. BNP: Invalid input(s): POCBNP CBG:  Recent Labs Lab 12/17/16 1741 12/17/16 2230 12/18/16 0725 12/18/16 1125 12/18/16 1634  GLUCAP 171* 140* 134* 151* 121*   D-Dimer No results for input(s): DDIMER in the last 72 hours. Hgb A1c No results for input(s): HGBA1C in the last 72 hours. Lipid Profile No results for input(s): CHOL, HDL, LDLCALC, TRIG, CHOLHDL, LDLDIRECT in the last 72 hours. Thyroid function studies No results for input(s): TSH, T4TOTAL, T3FREE, THYROIDAB in the last 72 hours.  Invalid input(s): FREET3 Anemia work up No results for input(s): VITAMINB12, FOLATE, FERRITIN, TIBC, IRON, RETICCTPCT in the last 72 hours. Urinalysis    Component Value Date/Time   COLORURINE AMBER (A) 12/11/2016 2115   APPEARANCEUR HAZY (A) 12/11/2016 2115   LABSPEC 1.020 12/11/2016 2115   PHURINE 5.0 12/11/2016 2115   GLUCOSEU NEGATIVE 12/11/2016 2115   HGBUR NEGATIVE 12/11/2016 2115   BILIRUBINUR NEGATIVE 12/11/2016 2115   BILIRUBINUR small (A) 07/13/2015 0908   BILIRUBINUR neg 01/06/2015 0926   KETONESUR NEGATIVE 12/11/2016 2115   PROTEINUR 30 (A) 12/11/2016 2115   UROBILINOGEN 0.2 07/13/2015 0908   UROBILINOGEN 1.0 05/18/2015  [place] 12/11/2016 2115   LEUKOCYTESUR NEGATIVE 12/11/2016 2115   Sepsis Labs Invalid input(s): PROCALCITONIN,  WBC,  LACTICIDVEN Microbiology Recent Results (from the past 240 hour(s))  Blood culture (routine x 2)     Status: None   Collection Time: 12/11/16  2:12 PM  Result Value Ref Range Status   Specimen Description BLOOD RIGHT CHEST PORTA CATH  Final   Special Requests   Final    BOTTLES DRAWN AEROBIC AND ANAEROBIC Blood Culture adequate volume   Culture   Final    NO GROWTH 5 DAYS Performed at [facility], [address] [place], [place] [address]    Report Status 12/16/2016 FINAL  Final  Blood culture (routine x  2)     Status: None   Collection Time: 12/11/16  3:35 PM  Result Value Ref Range Status   Specimen Description BLOOD LEFT HAND  Final   Special Requests   Final    BOTTLES DRAWN AEROBIC AND ANAEROBIC Blood Culture adequate volume   Culture   Final    NO GROWTH 5 DAYS Performed at [facility], [place] [address], [place], [place] [address]    Report Status 12/16/2016 FINAL  Final  Surgical pcr screen     Status: Abnormal   Collection Time: 12/12/16  5:57 PM  Result Value Ref Range Status   MRSA, PCR POSITIVE (A) NEGATIVE Final    Comment: RESULT CALLED TO, READ BACK BY AND VERIFIED WITH: Wynelle [place] [id number] @ 0032 BY J SCOTTON    Staphylococcus aureus POSITIVE (A) NEGATIVE Final    Comment:        The Xpert SA Assay (FDA approved for NASAL specimens in patients over 71 years of age), is one component of a comprehensive surveillance program.  Test performance has been validated by [facility] for patients greater than or equal to 24 year old. It is not intended to diagnose infection nor to guide or monitor treatment. RESULT CALLED TO, READ BACK BY AND VERIFIED WITH: Wynelle [place] [id number] @ 0032 BY J SCOTTON   Aerobic Culture (superficial specimen)     Status: None   Collection Time: 12/13/16  6:30 PM  Result Value Ref Range Status   Specimen Description WOUND LEFT FOURTH TOE AMPUTATION  Final   Special Requests NONE  Final   Gram Stain   Final    RARE WBC PRESENT, PREDOMINANTLY PMN RARE GRAM NEGATIVE RODS RARE GRAM POSITIVE COCCI IN PAIRS Performed at [facility], [place] [address] [place] [address] [place], [place] [address]    Culture   Final    FEW GROUP B STREP(S.AGALACTIAE)ISOLATED TESTING AGAINST S. AGALACTIAE NOT ROUTINELY PERFORMED DUE TO PREDICTABILITY OF AMP/PEN/VAN SUSCEPTIBILITY. RARE STAPHYLOCOCCUS AUREUS    Report Status 12/17/2016 FINAL  Final   Organism ID, Bacteria STAPHYLOCOCCUS AUREUS  Final      Susceptibility   Staphylococcus aureus - MIC*     CIPROFLOXACIN >=8 RESISTANT Resistant     ERYTHROMYCIN >=8 RESISTANT Resistant     GENTAMICIN <=0.5 SENSITIVE Sensitive     OXACILLIN >=4 RESISTANT Resistant     TETRACYCLINE <=1 SENSITIVE Sensitive     TRIMETH/SULFA <=10 SENSITIVE Sensitive     CLINDAMYCIN >=8 RESISTANT Resistant     RIFAMPIN <=0.5 SENSITIVE Sensitive     * RARE STAPHYLOCOCCUS AUREUS     SIGNED:   Tamieka Rancourt, Orpah Melter, MD  [facility] 12/18/2016, 5:29 PM  If 7PM-7AM, please contact night-coverage [email or web address] Password [email or web address]

## 2016-12-18 NOTE — Progress Notes (Signed)
Patient ID: Chad [name], male   DOB: [birth year], 46 y.o.   MRN: [id number]         [facility]  Date of Admission:  12/11/2016   Total days of antibiotics 8        Day 3 ceftriaxone        Day 3 metronidazole        Day 1 vancomycin  Active Problems:   Diabetic infection of left foot (HCC)   Morbid obesity (HCC)   Neuropathy due to type 2 diabetes mellitus ([place])   Cancer of left colon (HCC)   Sepsis ([place])   Metastatic cancer (HCC)   Normocytic anemia   Acute renal insufficiency   . aspirin EC  81 mg Oral Daily  . enoxaparin (LOVENOX) injection  80 mg Subcutaneous Q24H  . feeding supplement (ENSURE ENLIVE)  237 mL Oral BID BM  . feeding supplement (PRO-STAT SUGAR FREE 64)  30 mL Oral TID WC  . gabapentin  100 mg Oral TID  . insulin aspart  0-15 Units Subcutaneous TID WC  . insulin aspart  0-5 Units Subcutaneous QHS  . insulin glargine  25 Units Subcutaneous Daily  . levofloxacin  750 mg Oral Daily  . metroNIDAZOLE  500 mg Oral Q8H  . pantoprazole  80 mg Oral Daily  . polyethylene glycol  17 g Oral Daily    SUBJECTIVE: He has had pain very mild pain on the bottom of his foot after VAC wound dressing change. He has a new rash on his arms and abdomen. He believes the rash began before he started his current antibiotic regimen a little over 2 days ago.  Review of Systems: Review of Systems  Constitutional: Negative for chills, diaphoresis, fever and malaise/fatigue.  Gastrointestinal: Negative for abdominal pain, diarrhea, nausea and vomiting.  Musculoskeletal: Positive for joint pain.  Skin: Positive for rash.    Past Medical History:  Diagnosis Date  . Cancer [facility])    colon cancer  . Coronary artery disease   . Diabetes mellitus without complication ([place]) 09/9765   oral agents only (07/2015)  . ETOH abuse 2014  . Fatty liver 12/2014   on ultrasound. pt obese and abuses ETOH  . Gallstone 12/2014   GB stones and sludge on ultrasound  and CT,  . GERD (gastroesophageal reflux disease)   . Hypertension   . Morbid obesity with BMI of 50.0-59.9, adult ([place]) 03/2013  . Rectal bleeding 05/2015   07/2015 colonoscopy with malignant appearing sigmoid mass. Additional rectal, descending, polyps  . Shoulder pain, bilateral    due to previous injury    Social History  Substance Use Topics  . Smoking status: Never Smoker  . Smokeless tobacco: Current User    Types: Chew     Comment: occasionally dips tobacco - has used for approx 20 yrs (12/21/15)  . Alcohol use Yes     Comment: heavy drinking for 10 years, 2-3  drinks of liqour a night, has been cutting back.      Family History  Problem Relation Age of Onset  . Diabetes Father   . Pancreatic cancer Father 43       former smoker, but had not smoked in 62 years  . Cancer Maternal Uncle        d. 61s; unknown type of cancer -metastatic throughout abdomen   . Other Brother        oldest brother has had a normal colonoscopy  . Heart attack  Maternal Grandfather        d. 76-58; heavy smoker and drinker  . Heart attack Paternal Grandmother        d. early 30s  . Heart attack Paternal Grandfather 59  . Heart failure Maternal Uncle   . Skin cancer Maternal Uncle 42       NOS type; was out in the sun a lot  . Brain cancer Maternal Uncle        NOS type; mother's maternal half-brother dx. 46-68  . Cirrhosis Paternal Uncle   . Cancer Paternal Uncle        (x2-3) uncles with NOS cancers; d. 102s   No Known Allergies  OBJECTIVE: Vitals:   12/16/16 1559 12/17/16 1321 12/17/16 2050 12/18/16 0537  BP: (!) 164/71 (!) 150/73 (!) 168/84 (!) 175/67  Pulse: 70 71 72 66  Resp: '18 18 18 18  ' Temp: 98.8 F (37.1 C) 98.5 F (36.9 C) 98.9 F (37.2 C) 98.2 F (36.8 C)  TempSrc: Oral Oral Oral Oral  SpO2: 97% 98% 100% 98%  Weight:      Height:       Body mass index is 46.62 kg/m.  Physical Exam  Constitutional: He is oriented to person, place, and time.  He is talkative and in  good spirits.  Pulmonary/Chest:  His Port-A-Cath is currently accessed with antibiotic infusing.  Musculoskeletal: He exhibits edema. He exhibits no tenderness.  His nurse reports that his left foot wound is not as beefy red today. There are more areas of yellow slough.  Neurological: He is alert and oriented to person, place, and time.  Skin: No rash noted.  Psychiatric: Mood and affect normal.    Lab Results Lab Results  Component Value Date   WBC 8.1 12/16/2016   HGB 9.8 (L) 12/16/2016   HCT 29.5 (L) 12/16/2016   MCV 96.4 12/16/2016   PLT 314 12/16/2016    Lab Results  Component Value Date   CREATININE 1.70 (H) 12/18/2016   BUN 15 12/18/2016   NA 143 12/18/2016   K 3.7 12/18/2016   CL 109 12/18/2016   CO2 28 12/18/2016    Lab Results  Component Value Date   ALT 17 12/12/2016   AST 14 (L) 12/12/2016   ALKPHOS 72 12/12/2016   BILITOT 1.3 (H) 12/12/2016     Microbiology: Recent Results (from the past 240 hour(s))  Blood culture (routine x 2)     Status: None   Collection Time: 12/11/16  2:12 PM  Result Value Ref Range Status   Specimen Description BLOOD RIGHT CHEST PORTA CATH  Final   Special Requests   Final    BOTTLES DRAWN AEROBIC AND ANAEROBIC Blood Culture adequate volume   Culture   Final    NO GROWTH 5 DAYS Performed at [facility], [address], [place], [place] [address]    Report Status 12/16/2016 FINAL  Final  Blood culture (routine x 2)     Status: None   Collection Time: 12/11/16  3:35 PM  Result Value Ref Range Status   Specimen Description BLOOD LEFT HAND  Final   Special Requests   Final    BOTTLES DRAWN AEROBIC AND ANAEROBIC Blood Culture adequate volume   Culture   Final    NO GROWTH 5 DAYS Performed at [facility], [place] [address] [place], [place] [address]    Report Status 12/16/2016 FINAL  Final  Surgical pcr screen     Status: Abnormal  Collection Time: 12/12/16  5:57 PM  Result Value Ref Range Status   MRSA, PCR  POSITIVE (A) NEGATIVE Final    Comment: RESULT CALLED TO, READ BACK BY AND VERIFIED WITH: Wynelle Cleveland [id number] @ 0032 BY J SCOTTON    Staphylococcus aureus POSITIVE (A) NEGATIVE Final    Comment:        The Xpert SA Assay (FDA approved for NASAL specimens in patients over 4 years of age), is one component of a comprehensive surveillance program.  Test performance has been validated by [facility] for patients greater than or equal to 35 year old. It is not intended to diagnose infection nor to guide or monitor treatment. RESULT CALLED TO, READ BACK BY AND VERIFIED WITH: Wynelle Cleveland [id number] @ 0032 BY J SCOTTON   Aerobic Culture (superficial specimen)     Status: None   Collection Time: 12/13/16  6:30 PM  Result Value Ref Range Status   Specimen Description WOUND LEFT FOURTH TOE AMPUTATION  Final   Special Requests NONE  Final   Gram Stain   Final    RARE WBC PRESENT, PREDOMINANTLY PMN RARE GRAM NEGATIVE RODS RARE GRAM POSITIVE COCCI IN PAIRS Performed at [facility], [place] [address] [place], [place] [address]    Culture   Final    FEW GROUP B STREP(S.AGALACTIAE)ISOLATED TESTING AGAINST S. AGALACTIAE NOT ROUTINELY PERFORMED DUE TO PREDICTABILITY OF AMP/PEN/VAN SUSCEPTIBILITY. RARE STAPHYLOCOCCUS AUREUS    Report Status 12/17/2016 FINAL  Final   Organism ID, Bacteria STAPHYLOCOCCUS AUREUS  Final      Susceptibility   Staphylococcus aureus - MIC*    CIPROFLOXACIN >=8 RESISTANT Resistant     ERYTHROMYCIN >=8 RESISTANT Resistant     GENTAMICIN <=0.5 SENSITIVE Sensitive     OXACILLIN >=4 RESISTANT Resistant     TETRACYCLINE <=1 SENSITIVE Sensitive     TRIMETH/SULFA <=10 SENSITIVE Sensitive     CLINDAMYCIN >=8 RESISTANT Resistant     RIFAMPIN <=0.5 SENSITIVE Sensitive     * RARE STAPHYLOCOCCUS AUREUS     ASSESSMENT: He has polymicrobial infection of his left foot. Operative cultures have grown group B strep and MRSA and the operative Gram stain showed  gram-negative rods. I doubt that his current antibiotics are causing his rash and I will continue his current 3 drug regimen. His renal function will need to be monitored carefully.  Diagnosis: Left a diabetic foot infection  Culture Result: Group B strep and MRSA  No Known Allergies  Discharge antibiotics: Per pharmacy protocol IV vancomycin, oral levofloxacin and oral metronidazole Aim for Vancomycin trough 15-20 (unless otherwise indicated) Duration: Weeks End Date: 12/31/2016  [facility] Care Per Protocol:  Labs TWICE weekly while on IV antibiotics: _x_ CBC with differential _x_ BMP __ CMP _x_ CRP _x_ ESR _x_ Vancomycin trough  __ Please pull PIC at completion of IV antibiotics _x_ Please leave PIC in place until doctor has seen patient or been notified  Fax weekly labs to [phone]  Clinic Follow Up Appt: At [facility] with me on 12/31/2016  @   PLAN: 1. Continue vancomycin, levofloxacin and metronidazole 2. I will arrange follow-up in my clinic in 2 weeks  Michel Bickers, MD [facility] for [place] [phone] pager   [phone] cell 12/18/2016, 9:42 AM

## 2016-12-18 NOTE — Progress Notes (Signed)
Pharmacy Antibiotic Note  Chad [name] is a 46 y.o. male with colon cancer currently undergoing chemotherapy and diabetic foot infection with 4th toe ulcer.  He recently completed a course of Keflex and Bactrim.  He presented to the ED on 12/11/2016 with c/o bleeding and drainage from wound site, swelling extending from toe to forefoot to mid shin.  Xray shows erosion c/w osteomyelitis.  He is s/p 4th toe amputation on 6/1 and the culture is growing group B strep and rare MRSA.  Antibiotics have been adjusted, ID plans to discharge home on IV vancomycin with PO levofloxacin and metronidazole  Today, 12/18/2016: Day #8 total antibiotics  AKI :  SCr was elevated at time of admission, increased then improved, now increased again. UOP has been good  Baseline SCr 1.1, peak 2.02, currently 1.7  Normalized CrCl is estimated to currently = 54 ml/min  WBC WNL  Afebrile  Plan:  Continue Vancomycin 1750mg  IV q24h.  Goal vancomycin trough 15-20 mcg/mL  Due to obesity, dose vancomycin based on normalized CrCl  Renal function is borderline for needing q12h dosing  Follow-up renal function daily while inpatient d/t presence of AKI  Will need steady state trough and twice weekly trough and SCr monitoring  Recommend rechecking VT on 6/8 prior to the dose. ____________________________  Height: 6\' 3"  (190.5 cm) Weight: (!) 373 lb (169.2 kg) IBW/kg (Calculated) : 84.5  Temp (24hrs), Avg:98.5 F (36.9 C), Min:98.2 F (36.8 C), Max:98.9 F (37.2 C)   Recent Labs Lab 12/11/16 1418 12/12/16 0317 12/13/16 0511 12/13/16 2142 12/14/16 0850 12/15/16 0342 12/16/16 0319 12/17/16 0438 12/18/16 0435  WBC  --  10.2 9.1  --  8.0 7.4 8.1  --   --   CREATININE  --  1.77* 2.02*  --  1.81* 1.75* 1.64* 1.61* 1.70*  LATICACIDVEN 1.50  --   --   --   --   --   --   --   --   VANCOTROUGH  --   --   --  15  --   --   --   --   --     Estimated Creatinine Clearance: 89 mL/min (A) (by C-G formula  based on SCr of 1.7 mg/dL (H)).    No Known Allergies   Antimicrobials this admission:  5/30 vanc>>6/2, resume 6/5 5/30 zosyn>>6/2 6/2 doxy >> 6/3 6/3 linezolid >>6/4 6/4 ceftriaxone >> 6/5 6/4 metronidazole >> 6/5 levofloxacin >>  Dose adjustments this admission:  5/31 Vancomycin dose decreased from 1250 q12h to 1750 q24h for increasing SCr. 6/1 VT at 2130 = 15, not quite at steady state, continue 1750mg  q24h  Microbiology results:  5/30 BCx x2: ngtd 5/31 MRSA PCR: positive (chlx, bactroban) 6/1 toe wound: Grp B strep, rare MRSA  Gretta Arab PharmD, BCPS Pager [phone] 12/18/2016 7:22 AM

## 2016-12-18 NOTE — Consult Note (Addendum)
[place] Nurse wound follow up Wound type:Surgical amp of left fourth metatarsal. Measurement:4cm x 1.25cm x 2.5cm Wound bed: pale pink with areas of tan slough around medial and proximal perimeters, one black area 1cm x 1cm Drainage (amount, consistency, odor) small serosanguinous, no odor  Periwound:dry loose skin, some maceration Dressing procedure/placement/frequency: Cleansed and dried perimeter of wound, applied skin protectant, Cleansed wound with NS, filled depth of space of wound with one piece black sponge, applied drape to dorsal foot, used one piece of black sponge for bridge. Wound has declined, will notify Dr. Lorin Mercy as he requested. We will follow this patient and remain available to this patient, nursing, and the medical and surgical teams.   Fara Olden, RN-C, WTA-C Wound Treatment Associate   Addendum: Dr Duaine Dredge office closed for lunch, will call back after 1230.  Fara Olden, RN-C, WTA-C Wound Treatment Associate

## 2016-12-18 NOTE — Progress Notes (Signed)
Patient ID: Chad [name], male   DOB: 1971-06-24, 46 y.o.   MRN: [id number] Picture from [place] shows edge necrosis  And proximal remote area 1 to 2 cm dorsally with necrosis. I discussed with pt that more proximal amputation may be required. On IV ABX and VAC. He wants to go home and I can see him next week in office.

## 2016-12-18 NOTE — Progress Notes (Signed)
Will apply wet to dry dressing for discharge to home. Pt has been set up with [facility]  RN for Advanced home care wound vac placement.

## 2016-12-19 ENCOUNTER — Telehealth (INDEPENDENT_AMBULATORY_CARE_PROVIDER_SITE_OTHER): Payer: Self-pay

## 2016-12-19 ENCOUNTER — Telehealth: Payer: Self-pay

## 2016-12-19 ENCOUNTER — Telehealth (INDEPENDENT_AMBULATORY_CARE_PROVIDER_SITE_OTHER): Payer: Self-pay | Admitting: Orthopaedic Surgery

## 2016-12-19 MED ORDER — PEN NEEDLES 32G X 4 MM MISC: 1.0000 | Freq: Every day | 11 refills | Status: AC

## 2016-12-19 MED ORDER — BASAGLAR KWIKPEN 100 UNIT/ML ~~LOC~~ SOPN: 40.0000 [IU] | PEN_INJECTOR | Freq: Every day | SUBCUTANEOUS | 1 refills | Status: DC

## 2016-12-19 NOTE — Progress Notes (Signed)
F/u on Neg Pressure Wound Therapy device form-[employer] rep Kim also aware of CM trying to get the signed form from Dr. Lorin Mercy office between [place] [phone]-[place] office-tel#[phone].They are aware of urgency to receive signed auth form-since if not received to bill insurance timely it will be the patients responsibility for payment of device.

## 2016-12-19 NOTE — Telephone Encounter (Signed)
Pt states he takes up to 40 units qd now Of basaglar Needs a refill

## 2016-12-19 NOTE — Telephone Encounter (Signed)
I called and spoke with Chad [name], patient will not be considered home bound if he is returning to work. Which she was told he was going to tomorrow. Advised that we were unaware. Ok to change wet to dry dressing per [name]. Follow up made Tuesday at 1:00 6/12 with MY. Advised patient over the phone that the wound vac was placed to try to save his foot. Without it he is at risk for infection and higher amputation. From an insurance stand point he will no longer be home bound if he returns to work. Advised he needs to make a decision for himself is the risk worth the cost. He advised he would stay at home. [name] again that patient was counseled she will let their team know so wound vac can be applied once delivered.

## 2016-12-19 NOTE — Telephone Encounter (Signed)
Chad [name], patient care manager with [facility] stated that patient can have Infusion care done at home, but insurance will not cover wound care and negative pressure if patient is not home bound.  She would like to know what Dr. Lorin Mercy would like to do?  CB# is [phone].

## 2016-12-19 NOTE — Telephone Encounter (Signed)
Sent to [employer]. Please make sure pt knows to d/c the lantus 25u that he was prescribed by Dr. Wyline Copas yesterday on hosp d/c.  Basaglar and lantus are basically the same thing so he can use whichever one is cheaper on his insurance formulary (and I think basaglar has copay discount coupon he can cont to use)

## 2016-12-19 NOTE — Telephone Encounter (Signed)
Tye Maryland a Tourist information centre manager from [employer] faxed over a form for a wound vac yesterday to our office and is needing it filled out and sent back asap so the patient won't have to pay full price for it. Thank you. CB # [phone]

## 2016-12-19 NOTE — Telephone Encounter (Signed)
Please advise 

## 2016-12-19 NOTE — Telephone Encounter (Signed)
I called and spoke with Tye Maryland, they will deliver wound vac once form is faxed back. Advised this was a blank form there are no measurements. They will fill this component out. This was faxed back to [place].

## 2016-12-19 NOTE — Telephone Encounter (Signed)
Chad [name]

## 2016-12-20 ENCOUNTER — Telehealth (INDEPENDENT_AMBULATORY_CARE_PROVIDER_SITE_OTHER): Payer: Self-pay

## 2016-12-20 ENCOUNTER — Telehealth (INDEPENDENT_AMBULATORY_CARE_PROVIDER_SITE_OTHER): Payer: Self-pay | Admitting: Orthopaedic Surgery

## 2016-12-20 NOTE — Telephone Encounter (Signed)
Sent script for negative pressure. Left voicemail for Nira Conn to call back if she needed anything further.

## 2016-12-20 NOTE — Telephone Encounter (Signed)
fyi

## 2016-12-20 NOTE — Telephone Encounter (Signed)
Jennifer with [facility] called needing orders for negative pressure WD therapy. Anderson Malta advised a form was faxed over to be faxed back as soon as possible. The phone number to contact Anderson Malta is [phone]   Fax # [phone]

## 2016-12-20 NOTE — Telephone Encounter (Signed)
L/m with shaws note

## 2016-12-20 NOTE — Telephone Encounter (Signed)
I spoke with Anderson Malta. I have not received form. She emailed to me and I completed and faxed back to her.

## 2016-12-20 NOTE — Telephone Encounter (Signed)
Chad [name] with [facility] would you to refax orders for Negative pressure.  Stated fax wasn't received.  Fax number is [phone], attention negative pressure team. CB# is [phone]

## 2016-12-22 ENCOUNTER — Encounter: Payer: Self-pay | Admitting: Orthopaedic Surgery

## 2016-12-24 ENCOUNTER — Ambulatory Visit (INDEPENDENT_AMBULATORY_CARE_PROVIDER_SITE_OTHER): Payer: BLUE CROSS/BLUE SHIELD | Admitting: Orthopaedic Surgery

## 2016-12-24 ENCOUNTER — Encounter (INDEPENDENT_AMBULATORY_CARE_PROVIDER_SITE_OTHER): Payer: Self-pay | Admitting: Orthopaedic Surgery

## 2016-12-24 VITALS — BP 139/89 | HR 81 | Ht 75.0 in | Wt 351.0 lb

## 2016-12-24 DIAGNOSIS — I96 Gangrene, not elsewhere classified: Secondary | ICD-10-CM

## 2016-12-24 NOTE — Progress Notes (Signed)
Post-Op Visit Note   Patient: Chad [name]           Date of Birth: [birth year]           MRN: [id number] Visit Date: 12/24/2016 PCP: Shawnee Knapp, MD   Assessment & Plan: Patient states his diabetes doing much better his the fasting CBG in a.m. is in the 120s. 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 He has the [facility] applied and I will recheck him again in 15 days on a Wednesday morning so that he can then go home and have the VAC reapplied once the afternoon by the wound VAC nurse. The patient's post left fourth toe amputation with wound VAC application for healing.  Chief Complaint:  Chief Complaint  Patient presents with  . Left 4th Toe - Routine Post Op   Visit Diagnoses:  1. Gangrene of toe of left foot (HCC)     Plan: Recheck 15 days for back removal and wound check. Curvilinear we had some necrosis has improved.  Follow-Up Instructions: Return in about 15 days (around 01/08/2017).   Orders:  No orders of the defined types were placed in this encounter.  No orders of the defined types were placed in this encounter.   Imaging: No results found.  PMFS History: Patient Active Problem List   Diagnosis Date Noted  . Normocytic anemia 12/16/2016  . Acute renal insufficiency 12/16/2016  . Metastatic cancer ([place])   . Sepsis ([place]) 12/11/2016  . Goals of care, counseling/discussion 09/07/2016  . Port catheter in place 09/03/2016  . Family history of pancreatic cancer 12/21/2015  . 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 S/P colon resection 10/18/2015  . Cancer of left colon ([place])   . Gastroesophageal reflux disease without esophagitis 06/15/2015  . Chronic systolic HF (heart failure) ([place]) 04/06/2015  . Coronary artery calcification 04/06/2015  . Calculus of gallbladder without cholecystitis without obstruction 01/14/2015  . Hepatic steatosis 01/14/2015  . Neuropathy due to type 2 diabetes  mellitus ([place]) 01/14/2015  . Polypharmacy 01/14/2015  . History of alcohol abuse 01/14/2015  . Type 2 diabetes mellitus with hyperglycemia ([place]) 01/06/2015  . Morbid obesity ([place]) 01/06/2015  . Essential hypertension, benign 01/06/2015  . Microalbuminuria due to type 2 diabetes mellitus ([place]) 11/13/2014  . Diabetic infection of left foot ([place]) 03/15/2013   Past Medical History:  Diagnosis Date  . Cancer [facility])    colon cancer  . Coronary artery disease   . Diabetes mellitus without complication ([place]) 01/4258   oral agents only (07/2015)  . ETOH abuse 2014  . Fatty liver 12/2014   on ultrasound. pt obese and abuses ETOH  . Gallstone 12/2014   GB stones and sludge on ultrasound and CT,  . GERD (gastroesophageal reflux disease)   . Hypertension   . Morbid obesity with BMI of 50.0-59.9, adult ([place]) 03/2013  . Rectal bleeding 05/2015   07/2015 colonoscopy with malignant appearing sigmoid mass. Additional rectal, descending, polyps  . Shoulder pain, bilateral    due to previous injury    Family History  Problem Relation Age of Onset  . Diabetes Father   . Pancreatic cancer Father 76       former smoker, but had not smoked in 55 years  . Cancer Maternal Uncle        d. 33s; unknown type of cancer -metastatic throughout abdomen   . Other Brother  oldest brother has had a normal colonoscopy  . Heart attack Maternal Grandfather        d. 57-58; heavy smoker and drinker  . Heart attack Paternal Grandmother        d. early 37s  . Heart attack Paternal Grandfather 69  . Heart failure Maternal Uncle   . Skin cancer Maternal Uncle 42       NOS type; was out in the sun a lot  . Brain cancer Maternal Uncle        NOS type; mother's maternal half-brother dx. 56-68  . Cirrhosis Paternal Uncle   . Cancer Paternal Uncle        (x2-3) uncles with NOS cancers; d. 24s    Past Surgical History:  Procedure Laterality Date  . AMPUTATION Left 12/13/2016   Procedure: LEFT FOURTH TOE  AMPUTATION;  Surgeon: Marybelle Killings, MD;  Location: [facility];  Service: Orthopedics;  Laterality: Left;  . CARDIAC CATHETERIZATION N/A 04/06/2015   Procedure: Left Heart Cath and Coronary Angiography;  Surgeon: Belva Crome, MD;  Location: [facility];  Service: Cardiovascular;  Laterality: N/A;  . COLONOSCOPY N/A 07/25/2015   Procedure: COLONOSCOPY;  Surgeon: Gatha Mayer, MD;  Location: [facility];  Service: Gastroenterology;  Laterality: N/A;  . CYSTOSCOPY  10/18/2015   Procedure: CYSTOSCOPY FLEXIBLE URETHRAL DIALATION AND FOLEY PLACEMENT;  Surgeon: Alexis Frock, MD;  Location: [facility];  Service: Urology;;  . ESOPHAGOGASTRODUODENOSCOPY N/A 07/25/2015   Procedure: ESOPHAGOGASTRODUODENOSCOPY (EGD);  Surgeon: Gatha Mayer, MD;  Location: [facility];  Service: Gastroenterology;  Laterality: N/A;  . PORTACATH PLACEMENT Right 08/01/2016   Procedure: INSERTION PORT-A-CATH WITH ULTRASOUND AND FLUOROSOPIC GUIDANCE - RIGHT INTERNAL JUGULAR;  Surgeon: Michael Boston, MD;  Location: [facility];  Service: General;  Laterality: Right;   Social History   Occupational History  . Manager    Social History Main Topics  . Smoking status: Never Smoker  . Smokeless tobacco: Current User    Types: Chew     Comment: occasionally dips tobacco - has used for approx 20 yrs (12/21/15)  . Alcohol use Yes     Comment: heavy drinking for 10 years, 2-3  drinks of liqour a night, has been cutting back.    . Drug use: No  . Sexual activity: Not on file

## 2016-12-31 ENCOUNTER — Ambulatory Visit (INDEPENDENT_AMBULATORY_CARE_PROVIDER_SITE_OTHER): Payer: BLUE CROSS/BLUE SHIELD | Admitting: Internal Medicine

## 2016-12-31 ENCOUNTER — Encounter: Payer: Self-pay | Admitting: Internal Medicine

## 2016-12-31 ENCOUNTER — Ambulatory Visit: Payer: BLUE CROSS/BLUE SHIELD | Admitting: Internal Medicine

## 2016-12-31 DIAGNOSIS — E11628 Type 2 diabetes mellitus with other skin complications: Secondary | ICD-10-CM | POA: Diagnosis not present

## 2016-12-31 DIAGNOSIS — L089 Local infection of the skin and subcutaneous tissue, unspecified: Secondary | ICD-10-CM

## 2016-12-31 MED ORDER — DOXYCYCLINE HYCLATE 100 MG PO CAPS: 100.0000 mg | ORAL_CAPSULE | Freq: Two times a day (BID) | ORAL | 1 refills | Status: DC

## 2016-12-31 MED ORDER — AMOXICILLIN-POT CLAVULANATE 875-125 MG PO TABS: 1.0000 | ORAL_TABLET | Freq: Two times a day (BID) | ORAL | 1 refills | Status: DC

## 2016-12-31 NOTE — Assessment & Plan Note (Signed)
His heart for me to assess how he is doing with the wound VAC dressing on. He is due to follow up with his orthopedic surgeon, Dr. Lorin Mercy, next week. He is not tolerating his current antibiotic regimen well. I will change him to oral doxycycline and amoxicillin clavulanate and see him back in 3 weeks.

## 2016-12-31 NOTE — Progress Notes (Signed)
Chad [name] for Infectious Disease  Patient Active Problem List   Diagnosis Date Noted  . Diabetic infection of left foot ([place]) 03/15/2013    Priority: High  . Gangrene of toe of left foot ([place]) 12/24/2016  . Normocytic anemia 12/16/2016  . Acute renal insufficiency 12/16/2016  . Metastatic cancer ([place])   . Sepsis ([place]) 12/11/2016  . Goals of care, counseling/discussion 09/07/2016  . Port catheter in place 09/03/2016  . Family history of pancreatic cancer 12/21/2015  . 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 S/P colon resection 10/18/2015  . Cancer of left colon ([place])   . Gastroesophageal reflux disease without esophagitis 06/15/2015  . Chronic systolic HF (heart failure) ([place]) 04/06/2015  . Coronary artery calcification 04/06/2015  . Calculus of gallbladder without cholecystitis without obstruction 01/14/2015  . Hepatic steatosis 01/14/2015  . Neuropathy due to type 2 diabetes mellitus ([place]) 01/14/2015  . Polypharmacy 01/14/2015  . History of alcohol abuse 01/14/2015  . Type 2 diabetes mellitus with hyperglycemia ([place]) 01/06/2015  . Morbid obesity ([place]) 01/06/2015  . Essential hypertension, benign 01/06/2015  . Microalbuminuria due to type 2 diabetes mellitus ([place]) 11/13/2014    Patient's Medications  New Prescriptions   AMOXICILLIN-CLAVULANATE (AUGMENTIN) 875-125 MG TABLET    Take 1 tablet by mouth 2 (two) times daily.   DOXYCYCLINE (VIBRAMYCIN) 100 MG CAPSULE    Take 1 capsule (100 mg total) by mouth 2 (two) times daily.  Previous Medications   ACETAMINOPHEN (TYLENOL) 500 MG TABLET    Take 1,000 mg by mouth every 6 (six) hours as needed for moderate pain or headache.   AMLODIPINE (NORVASC) 5 MG TABLET    Take 1 tablet (5 mg total) by mouth daily.   ASCORBIC ACID (VITAMIN C) 1000 MG TABLET    Take 1,000 mg by mouth daily.   ASPIRIN EC 81 MG TABLET    Take 1 tablet (81 mg total) by mouth daily. DO NOT TAKE AGAIN UNTIL  08/09/15   BLOOD GLUCOSE METER KIT AND SUPPLIES KIT    Dispense based on patient and insurance preference. Use up to four times daily as directed. (FOR ICD-9 250.00, 250.01).   CARVEDILOL (COREG CR) 20 MG 24 HR CAPSULE    Take 20 mg by mouth daily.   CLOTRIMAZOLE (LOTRIMIN) 1 % CREAM    Apply topically 2 (two) times daily.   DIPHENOXYLATE-ATROPINE (LOMOTIL) 2.5-0.025 MG TABLET    Take 1-2 tablets by mouth 4 (four) times daily as needed for diarrhea or loose stools.   GLUCOSE BLOOD TEST STRIP    One touch strips test bid Dx. dm   INSULIN GLARGINE (BASAGLAR KWIKPEN) 100 UNIT/ML SOPN    Inject 0.4 mLs (40 Units total) into the skin at bedtime.   INSULIN PEN NEEDLE (PEN NEEDLES) 32G X 4 MM MISC    1 Device by Does not apply route daily.   JANUMET 50-1000 MG TABLET       LIDOCAINE-PRILOCAINE (EMLA) CREAM    Apply 1 application topically as needed.   LISINOPRIL-HYDROCHLOROTHIAZIDE (PRINZIDE,ZESTORETIC) 20-25 MG TABLET    TK 1 T PO D   OMEPRAZOLE (PRILOSEC) 40 MG CAPSULE    TAKE 1 CAPSULE(40 MG) BY MOUTH DAILY   ONDANSETRON (ZOFRAN) 8 MG TABLET    Take 1 tablet (8 mg total) by mouth every 8 (eight) hours as needed for nausea or vomiting.   OXYCODONE-ACETAMINOPHEN (PERCOCET/ROXICET) 5-325 MG TABLET    Take 1 tablet by  mouth every 6 (six) hours as needed for moderate pain.   PROCHLORPERAZINE (COMPAZINE) 10 MG TABLET    Take 1 tablet (10 mg total) by mouth every 6 (six) hours as needed for nausea or vomiting.   TRAZODONE (DESYREL) 50 MG TABLET    TK 1/2 T QAM AND 1 AND 1/2 T QHS  Modified Medications   No medications on file  Discontinued Medications   LEVOFLOXACIN (LEVAQUIN) 750 MG TABLET    Take 1 tablet (750 mg total) by mouth daily.   METRONIDAZOLE (FLAGYL) 500 MG TABLET    Take 1 tablet (500 mg total) by mouth every 8 (eight) hours.   VANCOMYCIN IVPB    Inject 1,750 mg into the vein daily. Indication:  osteomyelitis Last Day of Therapy:  12/31/2016 Labs - Sunday/Monday:  CBC/D, BMP, ESR, CRP, and  vancomycin trough. Labs - Thursday:  CBC/D, BMP, ESR, CRP, and vancomycin trough    Subjective: Chad [name] is in for his hospital follow-up visit. He developed a diabetic left foot infection and was hospitalized recently. He underwent left fourth toe amputation to the level of the metatarsal neck. Operative cultures grew group B strep and MRSA. He was discharged on IV vancomycin and oral levofloxacin and metronidazole. He is now completed 3 weeks of therapy. His VAC wound dressing is being changed every Monday Wednesday Friday. He feels like the wound is looking better. He is having some nausea that he associates with the IV vancomycin. He's also been having right shoulder pain related to a remote injury.  Review of Systems: Review of Systems  Constitutional: Positive for malaise/fatigue. Negative for chills, diaphoresis, fever and weight loss.  Gastrointestinal: Positive for nausea. Negative for abdominal pain, constipation, diarrhea, heartburn and vomiting.  Musculoskeletal: Positive for joint pain.    Past Medical History:  Diagnosis Date  . Cancer [facility])    colon cancer  . Coronary artery disease   . Diabetes mellitus without complication ([place]) 11/8525   oral agents only (07/2015)  . ETOH abuse 2014  . Fatty liver 12/2014   on ultrasound. pt obese and abuses ETOH  . Gallstone 12/2014   GB stones and sludge on ultrasound and CT,  . GERD (gastroesophageal reflux disease)   . Hypertension   . Morbid obesity with BMI of 50.0-59.9, adult ([place]) 03/2013  . Rectal bleeding 05/2015   07/2015 colonoscopy with malignant appearing sigmoid mass. Additional rectal, descending, polyps  . Shoulder pain, bilateral    due to previous injury    Social History  Substance Use Topics  . Smoking status: Never Smoker  . Smokeless tobacco: Former Systems developer    Types: Chew     Comment: occasionally dips tobacco - has used for approx 20 yrs (12/21/15)  . Alcohol use Yes     Comment: heavy drinking for 10 years,  2-3  drinks of liqour a night, has been cutting back.      Family History  Problem Relation Age of Onset  . Diabetes Father   . Pancreatic cancer Father 29       former smoker, but had not smoked in 60 years  . Cancer Maternal Uncle        d. 78s; unknown type of cancer -metastatic throughout abdomen   . Other Brother        oldest brother has had a normal colonoscopy  . Heart attack Maternal Grandfather        d. 57-58; heavy smoker and drinker  . Heart attack Paternal Grandmother  d. early 24s  . Heart attack Paternal Grandfather 86  . Heart failure Maternal Uncle   . Skin cancer Maternal Uncle 42       NOS type; was out in the sun a lot  . Brain cancer Maternal Uncle        NOS type; mother's maternal half-brother dx. 92-68  . Cirrhosis Paternal Uncle   . Cancer Paternal Uncle        (x2-3) uncles with NOS cancers; d. 60s    No Known Allergies  Objective: Vitals:   12/31/16 1112  BP: 127/88  Pulse: (!) 120  Temp: 98.1 F (36.7 C)  TempSrc: Oral  Weight: (!) 374 lb (169.6 kg)  Height: '6\' 3"'  (1.905 m)   Body mass index is 46.75 kg/m.  Physical Exam  Constitutional: He is oriented to person, place, and time.  He is a little uncomfortable due to his right shoulder pain and nausea.  Cardiovascular: Normal rate and regular rhythm.   Pulmonary/Chest: Effort normal and breath sounds normal. He has no wheezes. He has no rales.  His right anterior chest Port-A-Cath site looks good.  Abdominal: Soft. There is no tenderness.  Musculoskeletal:   The VAC wound dressing is inpla on is eftfoo.  Neurological: He is alert and oriented to person, place, and time.  Skin: No rash noted.  Psychiatric: Mood and affect normal.    Lab Results Sed Rate (mm/hr)  Date Value  12/12/2016 77 (H)   CRP (mg/dL)  Date Value  12/12/2016 18.4 (H)   Sedimentation rate 12/22/2016 58 CRP 12/22/2016 9.3   Problem List Items Addressed This Visit      High   Diabetic infection  of left foot ([place])    His heart for me to assess how he is doing with the wound VAC dressing on. He is due to follow up with his orthopedic surgeon, Dr. Lorin Mercy, next week. He is not tolerating his current antibiotic regimen well. I will change him to oral doxycycline and amoxicillin clavulanate and see him back in 3 weeks.      Relevant Medications   doxycycline (VIBRAMYCIN) 100 MG capsule   amoxicillin-clavulanate (AUGMENTIN) 875-125 MG tablet       Michel Bickers, MD [facility] for Infectious [place] [phone] pager   [phone] cell 12/31/2016, 12:29 PM

## 2017-01-08 ENCOUNTER — Ambulatory Visit (INDEPENDENT_AMBULATORY_CARE_PROVIDER_SITE_OTHER): Payer: BLUE CROSS/BLUE SHIELD | Admitting: Orthopaedic Surgery

## 2017-01-08 ENCOUNTER — Encounter (INDEPENDENT_AMBULATORY_CARE_PROVIDER_SITE_OTHER): Payer: Self-pay | Admitting: Orthopaedic Surgery

## 2017-01-08 VITALS — BP 143/97 | HR 92

## 2017-01-08 DIAGNOSIS — I96 Gangrene, not elsewhere classified: Secondary | ICD-10-CM | POA: Diagnosis not present

## 2017-01-08 NOTE — Progress Notes (Signed)
Post-Op Visit Note   Patient: Chad [name]           Date of Birth: May 01, 1971           MRN: [id number] Visit Date: 01/08/2017 PCP: Shawnee Knapp, MD   Assessment & Plan: Outpatient lab shows white blood count 12,600. Glucose 194 creatinine 1.33, BUNs 17. Sedimentation rate 62 and C reactive protein is 27. Sharp excisional debridement performed with sterile scalpel sterile pickups and scissors debriding tissue that is necrotic. Continue VAC recheck 3 weeks  Chief Complaint:  Chief Complaint  Patient presents with  . Left 4th Toe - Routine Post Op   Visit Diagnoses:  1. Toe gangrene ([place])     Plan: Follow-up left fourth toe amputation for diabetic infection with osteomyelitis. White necrotic tissue was debrided today and his back will be reapplied this afternoon. We will recheck him in 3 weeks he is on doxycycline and Augmentin.  Follow-Up Instructions: No Follow-up on file.   Orders:  No orders of the defined types were placed in this encounter.  No orders of the defined types were placed in this encounter.   Imaging: No results found.  PMFS History: Patient Active Problem List   Diagnosis Date Noted  . Gangrene of toe of left foot ([place]) 12/24/2016  . Normocytic anemia 12/16/2016  . Acute renal insufficiency 12/16/2016  . Metastatic cancer ([place])   . Sepsis ([place]) 12/11/2016  . Goals of care, counseling/discussion 09/07/2016  . Port catheter in place 09/03/2016  . Family history of pancreatic cancer 12/21/2015  . 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 S/P colon resection 10/18/2015  . Cancer of left colon ([place])   . Gastroesophageal reflux disease without esophagitis 06/15/2015  . Chronic systolic HF (heart failure) ([place]) 04/06/2015  . Coronary artery calcification 04/06/2015  . Calculus of gallbladder without cholecystitis without obstruction 01/14/2015  . Hepatic steatosis 01/14/2015  . Neuropathy due to type 2 diabetes  mellitus ([place]) 01/14/2015  . Polypharmacy 01/14/2015  . History of alcohol abuse 01/14/2015  . Type 2 diabetes mellitus with hyperglycemia ([place]) 01/06/2015  . Morbid obesity ([place]) 01/06/2015  . Essential hypertension, benign 01/06/2015  . Microalbuminuria due to type 2 diabetes mellitus ([place]) 11/13/2014  . Diabetic infection of left foot ([place]) 03/15/2013   Past Medical History:  Diagnosis Date  . Cancer [facility])    colon cancer  . Coronary artery disease   . Diabetes mellitus without complication ([place]) 12/2829   oral agents only (07/2015)  . ETOH abuse 2014  . Fatty liver 12/2014   on ultrasound. pt obese and abuses ETOH  . Gallstone 12/2014   GB stones and sludge on ultrasound and CT,  . GERD (gastroesophageal reflux disease)   . Hypertension   . Morbid obesity with BMI of 50.0-59.9, adult ([place]) 03/2013  . Rectal bleeding 05/2015   07/2015 colonoscopy with malignant appearing sigmoid mass. Additional rectal, descending, polyps  . Shoulder pain, bilateral    due to previous injury    Family History  Problem Relation Age of Onset  . Diabetes Father   . Pancreatic cancer Father 64       former smoker, but had not smoked in 64 years  . Cancer Maternal Uncle        d. 6s; unknown type of cancer -metastatic throughout abdomen   . Other Brother        oldest brother has had a normal colonoscopy  . Heart attack Maternal Grandfather  d. 67-58; heavy smoker and drinker  . Heart attack Paternal Grandmother        d. early 40s  . Heart attack Paternal Grandfather 42  . Heart failure Maternal Uncle   . Skin cancer Maternal Uncle 42       NOS type; was out in the sun a lot  . Brain cancer Maternal Uncle        NOS type; mother's maternal half-brother dx. 57-68  . Cirrhosis Paternal Uncle   . Cancer Paternal Uncle        (x2-3) uncles with NOS cancers; d. 28s    Past Surgical History:  Procedure Laterality Date  . AMPUTATION Left 12/13/2016   Procedure: LEFT FOURTH TOE  AMPUTATION;  Surgeon: Marybelle Killings, MD;  [facility]: [facility];  Service: Orthopedics;  Laterality: Left;  . CARDIAC CATHETERIZATION N/A 04/06/2015   Procedure: Left Heart Cath and Coronary Angiography;  Surgeon: Belva Crome, MD;  Location: [facility];  Service: Cardiovascular;  Laterality: N/A;  . COLONOSCOPY N/A 07/25/2015   Procedure: COLONOSCOPY;  Surgeon: Gatha Mayer, MD;  [facility];  Service: Gastroenterology;  Laterality: N/A;  . CYSTOSCOPY  10/18/2015   Procedure: CYSTOSCOPY FLEXIBLE URETHRAL DIALATION AND FOLEY PLACEMENT;  Surgeon: Alexis Frock, MD;  [facility];  Service: Urology;;  . ESOPHAGOGASTRODUODENOSCOPY N/A 07/25/2015   Procedure: ESOPHAGOGASTRODUODENOSCOPY (EGD);  Surgeon: Gatha Mayer, MD;  Location: [facility];  Service: Gastroenterology;  Laterality: N/A;  . PORTACATH PLACEMENT Right 08/01/2016   Procedure: INSERTION PORT-A-CATH WITH ULTRASOUND AND FLUOROSOPIC GUIDANCE - RIGHT INTERNAL JUGULAR;  Surgeon: Michael Boston, MD;  Location: [facility];  Service: General;  Laterality: Right;   Social History   Occupational History  . Manager    Social History Main Topics  . Smoking status: Never Smoker  . Smokeless tobacco: Former Systems developer    Types: Chew     Comment: occasionally dips tobacco - has used for approx 20 yrs (12/21/15)  . Alcohol use Yes     Comment: heavy drinking for 10 years, 2-3  drinks of liqour a night, has been cutting back.    . Drug use: No  . Sexual activity: Not on file

## 2017-01-27 ENCOUNTER — Telehealth (INDEPENDENT_AMBULATORY_CARE_PROVIDER_SITE_OTHER): Payer: Self-pay | Admitting: Orthopaedic Surgery

## 2017-01-27 NOTE — Telephone Encounter (Signed)
fyi

## 2017-01-27 NOTE — Telephone Encounter (Signed)
NURSE FROM [facility] CALLED AND STATED PT WANTS TO LEAVE WOUND VAC OFF. JUST WANTED TO TO BE AWARE.  [phone]

## 2017-01-27 NOTE — Telephone Encounter (Signed)
Noted  

## 2017-01-28 ENCOUNTER — Encounter (INDEPENDENT_AMBULATORY_CARE_PROVIDER_SITE_OTHER): Payer: Self-pay | Admitting: Orthopaedic Surgery

## 2017-01-28 ENCOUNTER — Ambulatory Visit (INDEPENDENT_AMBULATORY_CARE_PROVIDER_SITE_OTHER): Payer: BLUE CROSS/BLUE SHIELD | Admitting: Orthopaedic Surgery

## 2017-01-28 VITALS — BP 135/79 | HR 89 | Ht 75.0 in | Wt 360.0 lb

## 2017-01-28 DIAGNOSIS — E11628 Type 2 diabetes mellitus with other skin complications: Secondary | ICD-10-CM

## 2017-01-28 DIAGNOSIS — L089 Local infection of the skin and subcutaneous tissue, unspecified: Secondary | ICD-10-CM

## 2017-01-28 NOTE — Progress Notes (Signed)
Post-Op Visit Note   Patient: Chad [name]           Date of Birth: January 16, 1971           MRN: [id number] Visit Date: 01/28/2017 PCP: Shawnee Knapp, MD   Assessment & Plan:  Chief Complaint:  Chief Complaint  Patient presents with  . Left 4th Toe - Routine Post Op   Visit Diagnoses:  1. Diabetic infection of left foot ([place])     Plan: Status post left fourth toe amputation. There is necrotic tendon present in the wound and this was sharply debrided with the sterile scalpel and pickups. He does some have some areas of granulation. 15 scalpel blade used for sharp excisional debridement of the exposed extensor tendon. Continue wet-to-dry dressing changes. Continue antibiotics per Dr. Megan Salon. Return 2 weeks.  Follow-Up Instructions: Return in about 2 weeks (around 02/11/2017).   Orders:  No orders of the defined types were placed in this encounter.  No orders of the defined types were placed in this encounter.   Imaging: No results found.  PMFS History: Patient Active Problem List   Diagnosis Date Noted  . Gangrene of toe of left foot ([place]) 12/24/2016  . Normocytic anemia 12/16/2016  . Acute renal insufficiency 12/16/2016  . Metastatic cancer ([place])   . Sepsis ([place]) 12/11/2016  . Goals of care, counseling/discussion 09/07/2016  . Port catheter in place 09/03/2016  . Family history of pancreatic cancer 12/21/2015  . 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 S/P colon resection 10/18/2015  . Cancer of left colon ([place])   . Gastroesophageal reflux disease without esophagitis 06/15/2015  . Chronic systolic HF (heart failure) ([place]) 04/06/2015  . Coronary artery calcification 04/06/2015  . Calculus of gallbladder without cholecystitis without obstruction 01/14/2015  . Hepatic steatosis 01/14/2015  . Neuropathy due to type 2 diabetes mellitus ([place]) 01/14/2015  . Polypharmacy 01/14/2015  . History of alcohol abuse 01/14/2015  . Type 2  diabetes mellitus with hyperglycemia ([place]) 01/06/2015  . Morbid obesity ([place]) 01/06/2015  . Essential hypertension, benign 01/06/2015  . Microalbuminuria due to type 2 diabetes mellitus ([place]) 11/13/2014  . Diabetic infection of left foot ([place]) 03/15/2013   Past Medical History:  Diagnosis Date  . Cancer [facility])    colon cancer  . Coronary artery disease   . Diabetes mellitus without complication ([place]) 01/16   oral agents only (07/2015)  . ETOH abuse 2014  . Fatty liver 12/2014   on ultrasound. pt obese and abuses ETOH  . Gallstone 12/2014   GB stones and sludge on ultrasound and CT,  . GERD (gastroesophageal reflux disease)   . Hypertension   . Morbid obesity with BMI of 50.0-59.9, adult ([place]) 03/2013  . Rectal bleeding 05/2015   07/2015 colonoscopy with malignant appearing sigmoid mass. Additional rectal, descending, polyps  . Shoulder pain, bilateral    due to previous injury    Family History  Problem Relation Age of Onset  . Diabetes Father   . Pancreatic cancer Father 76       former smoker, but had not smoked in 57 years  . Cancer Maternal Uncle        d. 32s; unknown type of cancer -metastatic throughout abdomen   . Other Brother        oldest brother has had a normal colonoscopy  . Heart attack Maternal Grandfather        d. 57-58; heavy smoker and drinker  . Heart  attack Paternal Grandmother        d. early 79s  . Heart attack Paternal Grandfather 12  . Heart failure Maternal Uncle   . Skin cancer Maternal Uncle 42       NOS type; was out in the sun a lot  . Brain cancer Maternal Uncle        NOS type; mother's maternal half-brother dx. 71-68  . Cirrhosis Paternal Uncle   . Cancer Paternal Uncle        (x2-3) uncles with NOS cancers; d. 4s    Past Surgical History:  Procedure Laterality Date  . AMPUTATION Left 12/13/2016   Procedure: LEFT FOURTH TOE AMPUTATION;  Surgeon: Marybelle Killings, MD;  [facility];  Service: Orthopedics;  Laterality: Left;  . CARDIAC  CATHETERIZATION N/A 04/06/2015   Procedure: Left Heart Cath and Coronary Angiography;  Surgeon: Belva Crome, MD;  Location: [facility];  Service: Cardiovascular;  Laterality: N/A;  . COLONOSCOPY N/A 07/25/2015   Procedure: COLONOSCOPY;  Surgeon: Gatha Mayer, MD;  [facility];  Service: Gastroenterology;  Laterality: N/A;  . CYSTOSCOPY  10/18/2015   Procedure: CYSTOSCOPY FLEXIBLE URETHRAL DIALATION AND FOLEY PLACEMENT;  Surgeon: Alexis Frock, MD;  [facility];  Service: Urology;;  . ESOPHAGOGASTRODUODENOSCOPY N/A 07/25/2015   Procedure: ESOPHAGOGASTRODUODENOSCOPY (EGD);  Surgeon: Gatha Mayer, MD;  Location[facility];  Service: Gastroenterology;  Laterality: N/A;  . PORTACATH PLACEMENT Right 08/01/2016   Procedure: INSERTION PORT-A-CATH WITH ULTRASOUND AND FLUOROSOPIC GUIDANCE - RIGHT INTERNAL JUGULAR;  Surgeon: Michael Boston, MD;  Location: [facility];  Service: General;  Laterality: Right;   Social History   Occupational History  . Manager    Social History Main Topics  . Smoking status: Never Smoker  . Smokeless tobacco: Former Systems developer    Types: Chew     Comment: occasionally dips tobacco - has used for approx 20 yrs (12/21/15)  . Alcohol use Yes     Comment: heavy drinking for 10 years, 2-3  drinks of liqour a night, has been cutting back.    . Drug use: No  . Sexual activity: Not on file

## 2017-01-29 ENCOUNTER — Telehealth (INDEPENDENT_AMBULATORY_CARE_PROVIDER_SITE_OTHER): Payer: Self-pay | Admitting: Orthopaedic Surgery

## 2017-01-29 NOTE — Telephone Encounter (Signed)
I called and Patty and advised. She is going to continue to see the patient for wound checks once a week but D/C the vac.

## 2017-01-29 NOTE — Telephone Encounter (Signed)
PT'S HH WANTS TO KNOW IF PT STILL NEEDS WOUND VAC AND IF SHE STILL NEEDS TO COME SEE HIM.   [phone] PATTY

## 2017-01-29 NOTE — Telephone Encounter (Signed)
Please advise 

## 2017-01-29 NOTE — Telephone Encounter (Signed)
OK to stop vac thanks

## 2017-01-30 ENCOUNTER — Ambulatory Visit: Payer: BLUE CROSS/BLUE SHIELD | Admitting: Internal Medicine

## 2017-02-03 ENCOUNTER — Ambulatory Visit: Payer: BLUE CROSS/BLUE SHIELD | Admitting: Internal Medicine

## 2017-02-05 ENCOUNTER — Ambulatory Visit (INDEPENDENT_AMBULATORY_CARE_PROVIDER_SITE_OTHER): Payer: BLUE CROSS/BLUE SHIELD | Admitting: Orthopaedic Surgery

## 2017-02-11 ENCOUNTER — Encounter: Payer: Self-pay | Admitting: Internal Medicine

## 2017-02-11 ENCOUNTER — Encounter (INDEPENDENT_AMBULATORY_CARE_PROVIDER_SITE_OTHER): Payer: Self-pay | Admitting: Orthopaedic Surgery

## 2017-02-11 ENCOUNTER — Ambulatory Visit (INDEPENDENT_AMBULATORY_CARE_PROVIDER_SITE_OTHER): Payer: BLUE CROSS/BLUE SHIELD | Admitting: Orthopaedic Surgery

## 2017-02-11 ENCOUNTER — Ambulatory Visit (INDEPENDENT_AMBULATORY_CARE_PROVIDER_SITE_OTHER): Payer: BLUE CROSS/BLUE SHIELD | Admitting: Internal Medicine

## 2017-02-11 VITALS — BP 148/86 | HR 97 | Ht 75.0 in | Wt 360.0 lb

## 2017-02-11 DIAGNOSIS — L089 Local infection of the skin and subcutaneous tissue, unspecified: Secondary | ICD-10-CM | POA: Diagnosis not present

## 2017-02-11 DIAGNOSIS — E11628 Type 2 diabetes mellitus with other skin complications: Secondary | ICD-10-CM

## 2017-02-11 DIAGNOSIS — Z89422 Acquired absence of other left toe(s): Secondary | ICD-10-CM

## 2017-02-11 DIAGNOSIS — S98132A Complete traumatic amputation of one left lesser toe, initial encounter: Secondary | ICD-10-CM

## 2017-02-11 LAB — CBC
HCT: 33.1 % — ABNORMAL LOW (ref 38.5–50.0)
HEMOGLOBIN: 10.6 g/dL — AB (ref 13.2–17.1)
MCH: 28.3 pg (ref 27.0–33.0)
MCHC: 32 g/dL (ref 32.0–36.0)
MCV: 88.5 fL (ref 80.0–100.0)
MPV: 9.3 fL (ref 7.5–12.5)
Platelets: 368 10*3/uL (ref 140–400)
RBC: 3.74 MIL/uL — ABNORMAL LOW (ref 4.20–5.80)
RDW: 15.2 % — AB (ref 11.0–15.0)
WBC: 12.8 10*3/uL — ABNORMAL HIGH (ref 3.8–10.8)

## 2017-02-11 NOTE — Progress Notes (Signed)
Post-Op Visit Note   Patient: Chad [name]           Date of Birth: 09/11/1970           MRN: [id number] Visit Date: 02/11/2017 PCP: Shawnee Knapp, MD   Assessment & Plan: Follow-up fourth toe amputation due to diabetes. He is an excellent job watching his sugars and his decreased his insulin from 40 units a day now to down 15-20 units due to decreasing CBGs. Inspect inspection of his foot demonstrates no further necrotic tissue needs to be debrided he has about 60% granulation tissue. Continue dressing changes. No cellulitis. Recheck 3 weeks  Chief Complaint:  Chief Complaint  Patient presents with  . Left 4th Toe - Routine Post Op   Visit Diagnoses:  1. Amputated toe of left foot ([place])     Plan: Return in 3 weeks. Continue his excellent improvement in his diabetic control.    Follow-Up Instructions: Return in about 3 weeks (around 03/04/2017).   Orders:  No orders of the defined types were placed in this encounter.  No orders of the defined types were placed in this encounter.   Imaging: No results found.  PMFS History: Patient Active Problem List   Diagnosis Date Noted  . Gangrene of toe of left foot ([place]) 12/24/2016  . Normocytic anemia 12/16/2016  . Acute renal insufficiency 12/16/2016  . Metastatic cancer ([place])   . Sepsis ([place]) 12/11/2016  . Goals of care, counseling/discussion 09/07/2016  . Port catheter in place 09/03/2016  . Family history of pancreatic cancer 12/21/2015  . 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 S/P colon resection 10/18/2015  . Cancer of left colon ([place])   . Gastroesophageal reflux disease without esophagitis 06/15/2015  . Chronic systolic HF (heart failure) ([place]) 04/06/2015  . Coronary artery calcification 04/06/2015  . Calculus of gallbladder without cholecystitis without obstruction 01/14/2015  . Hepatic steatosis 01/14/2015  . Neuropathy due to type 2 diabetes mellitus ([place]) 01/14/2015  .  Polypharmacy 01/14/2015  . History of alcohol abuse 01/14/2015  . Type 2 diabetes mellitus with hyperglycemia ([place]) 01/06/2015  . Morbid obesity ([place]) 01/06/2015  . Essential hypertension, benign 01/06/2015  . Microalbuminuria due to type 2 diabetes mellitus ([place]) 11/13/2014  . Diabetic infection of left foot ([place]) 03/15/2013   Past Medical History:  Diagnosis Date  . Cancer [facility])    colon cancer  . Coronary artery disease   . Diabetes mellitus without complication ([place]) 09/7626   oral agents only (07/2015)  . ETOH abuse 2014  . Fatty liver 12/2014   on ultrasound. pt obese and abuses ETOH  . Gallstone 12/2014   GB stones and sludge on ultrasound and CT,  . GERD (gastroesophageal reflux disease)   . Hypertension   . Morbid obesity with BMI of 50.0-59.9, adult ([place]) 03/2013  . Rectal bleeding 05/2015   07/2015 colonoscopy with malignant appearing sigmoid mass. Additional rectal, descending, polyps  . Shoulder pain, bilateral    due to previous injury    Family History  Problem Relation Age of Onset  . Diabetes Father   . Pancreatic cancer Father 56       former smoker, but had not smoked in 36 years  . Cancer Maternal Uncle        d. 45s; unknown type of cancer -metastatic throughout abdomen   . Other Brother        oldest brother has had a normal colonoscopy  . Heart attack  Maternal Grandfather        d. 67-58; heavy smoker and drinker  . Heart attack Paternal Grandmother        d. early 56s  . Heart attack Paternal Grandfather 10  . Heart failure Maternal Uncle   . Skin cancer Maternal Uncle 42       NOS type; was out in the sun a lot  . Brain cancer Maternal Uncle        NOS type; mother's maternal half-brother dx. 41-68  . Cirrhosis Paternal Uncle   . Cancer Paternal Uncle        (x2-3) uncles with NOS cancers; d. 36s    Past Surgical History:  Procedure Laterality Date  . AMPUTATION Left 12/13/2016   Procedure: LEFT FOURTH TOE AMPUTATION;  Surgeon: Marybelle Killings, MD;   [facility];  Service: Orthopedics;  Laterality: Left;  . CARDIAC CATHETERIZATION N/A 04/06/2015   Procedure: Left Heart Cath and Coronary Angiography;  Surgeon: Belva Crome, MD;  Location: [place] [facility];  Service: Cardiovascular;  Laterality: N/A;  . COLONOSCOPY N/A 07/25/2015   Procedure: COLONOSCOPY;  Surgeon: Gatha Mayer, MD;  [facility];  Service: Gastroenterology;  Laterality: N/A;  . CYSTOSCOPY  10/18/2015   Procedure: CYSTOSCOPY FLEXIBLE URETHRAL DIALATION AND FOLEY PLACEMENT;  Surgeon: Alexis Frock, MD;  [facility];  Service: Urology;;  . ESOPHAGOGASTRODUODENOSCOPY N/A 07/25/2015   Procedure: ESOPHAGOGASTRODUODENOSCOPY (EGD);  Surgeon: Gatha Mayer, MD;  Location: [facility];  Service: Gastroenterology;  Laterality: N/A;  . PORTACATH PLACEMENT Right 08/01/2016   Procedure: INSERTION PORT-A-CATH WITH ULTRASOUND AND FLUOROSOPIC GUIDANCE - RIGHT INTERNAL JUGULAR;  Surgeon: Michael Boston, MD;  Location: [place];  Service: General;  Laterality: Right;   Social History   Occupational History  . Manager    Social History Main Topics  . Smoking status: Never Smoker  . Smokeless tobacco: Former Systems developer    Types: Chew     Comment: occasionally dips tobacco - has used for approx 20 yrs (12/21/15)  . Alcohol use Yes     Comment: heavy drinking for 10 years, 2-3  drinks of liqour a night, has been cutting back.    . Drug use: No  . Sexual activity: Not on file

## 2017-02-11 NOTE — Assessment & Plan Note (Signed)
I'm hopeful that his diabetic foot infection has now been cured with a combination of surgery to amputate his left fourth toe and 2 months of antibiotic therapy. I will discuss the situation with Dr. Lorin Mercy and get blood work today. He will follow-up with me in 4 weeks.

## 2017-02-11 NOTE — Progress Notes (Signed)
[facility] for Infectious Disease  Patient Active Problem List   Diagnosis Date Noted  . Diabetic infection of left foot ([place]) 03/15/2013    Priority: High  . Gangrene of toe of left foot ([place]) 12/24/2016  . Normocytic anemia 12/16/2016  . Acute renal insufficiency 12/16/2016  . Metastatic cancer ([place])   . Sepsis ([place]) 12/11/2016  . Goals of care, counseling/discussion 09/07/2016  . Port catheter in place 09/03/2016  . Family history of pancreatic cancer 12/21/2015  . 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 S/P colon resection 10/18/2015  . Cancer of left colon ([place])   . Gastroesophageal reflux disease without esophagitis 06/15/2015  . Chronic systolic HF (heart failure) ([place]) 04/06/2015  . Coronary artery calcification 04/06/2015  . Calculus of gallbladder without cholecystitis without obstruction 01/14/2015  . Hepatic steatosis 01/14/2015  . Neuropathy due to type 2 diabetes mellitus ([place]) 01/14/2015  . Polypharmacy 01/14/2015  . History of alcohol abuse 01/14/2015  . Type 2 diabetes mellitus with hyperglycemia ([place]) 01/06/2015  . Morbid obesity ([place]) 01/06/2015  . Essential hypertension, benign 01/06/2015  . Microalbuminuria due to type 2 diabetes mellitus ([place]) 11/13/2014    Patient's Medications  New Prescriptions   No medications on file  Previous Medications   ACETAMINOPHEN (TYLENOL) 500 MG TABLET    Take 1,000 mg by mouth every 6 (six) hours as needed for moderate pain or headache.   AMLODIPINE (NORVASC) 5 MG TABLET    Take 1 tablet (5 mg total) by mouth daily.   ASCORBIC ACID (VITAMIN C) 1000 MG TABLET    Take 1,000 mg by mouth daily.   ASPIRIN EC 81 MG TABLET    Take 1 tablet (81 mg total) by mouth daily. DO NOT TAKE AGAIN UNTIL 08/09/15   BLOOD GLUCOSE METER KIT AND SUPPLIES KIT    Dispense based on patient and insurance preference. Use up to four times daily as directed. (FOR ICD-9 250.00, 250.01).   CARVEDILOL  (COREG CR) 20 MG 24 HR CAPSULE    Take 20 mg by mouth daily.   CLOTRIMAZOLE (LOTRIMIN) 1 % CREAM    Apply topically 2 (two) times daily.   DIPHENOXYLATE-ATROPINE (LOMOTIL) 2.5-0.025 MG TABLET    Take 1-2 tablets by mouth 4 (four) times daily as needed for diarrhea or loose stools.   GLUCOSE BLOOD TEST STRIP    One touch strips test bid Dx. dm   INSULIN GLARGINE (BASAGLAR KWIKPEN) 100 UNIT/ML SOPN    Inject 0.4 mLs (40 Units total) into the skin at bedtime.   INSULIN PEN NEEDLE (PEN NEEDLES) 32G X 4 MM MISC    1 Device by Does not apply route daily.   JANUMET 50-1000 MG TABLET       LIDOCAINE-PRILOCAINE (EMLA) CREAM    Apply 1 application topically as needed.   LISINOPRIL-HYDROCHLOROTHIAZIDE (PRINZIDE,ZESTORETIC) 20-25 MG TABLET    TK 1 T PO D   OMEPRAZOLE (PRILOSEC) 40 MG CAPSULE    TAKE 1 CAPSULE(40 MG) BY MOUTH DAILY   ONDANSETRON (ZOFRAN) 8 MG TABLET    Take 1 tablet (8 mg total) by mouth every 8 (eight) hours as needed for nausea or vomiting.   OXYCODONE-ACETAMINOPHEN (PERCOCET/ROXICET) 5-325 MG TABLET    Take 1 tablet by mouth every 6 (six) hours as needed for moderate pain.   PROCHLORPERAZINE (COMPAZINE) 10 MG TABLET    Take 1 tablet (10 mg total) by mouth every 6 (six) hours as needed for  nausea or vomiting.   TRAZODONE (DESYREL) 50 MG TABLET    TK 1/2 T QAM AND 1 AND 1/2 T QHS  Modified Medications   No medications on file  Discontinued Medications   AMOXICILLIN-CLAVULANATE (AUGMENTIN) 875-125 MG TABLET    Take 1 tablet by mouth 2 (two) times daily.   DOXYCYCLINE (VIBRAMYCIN) 100 MG CAPSULE    Take 1 capsule (100 mg total) by mouth 2 (two) times daily.   LEVOFLOXACIN (LEVAQUIN) 750 MG TABLET    TK 1 T PO D START ON 12/19/16.   METRONIDAZOLE (FLAGYL) 500 MG TABLET    TK 1 T PO Q 8 H    Subjective: Chad [name] is in for his routine follow-up visit. He completed 3 weeks of IV antibiotics for his diabetic foot infection then change to oral amoxicillin clavulanate and doxycycline when he  last saw me on 12/31/2016. He says he developed some pain in his right flank and was worried that one of his antibiotics was causing kidney problems so he stopped taking the amoxicillin clavulanate about 4 weeks ago. He has stayed on doxycycline but he continues to have some intermittent pain so he stopped taking it 3 days ago. He saw Dr. Lorin Mercy this morning.  Review of Systems: Review of Systems  Constitutional: Negative for chills, diaphoresis and fever.  HENT: Positive for congestion.   Gastrointestinal: Negative for abdominal pain, diarrhea, nausea and vomiting.       Intermittent, mild right flank pain as noted in history of present illness.  Musculoskeletal: Negative for joint pain.    Past Medical History:  Diagnosis Date  . Cancer [facility])    colon cancer  . Coronary artery disease   . Diabetes mellitus without complication ([place]) 03/7587   oral agents only (07/2015)  . ETOH abuse 2014  . Fatty liver 12/2014   on ultrasound. pt obese and abuses ETOH  . Gallstone 12/2014   GB stones and sludge on ultrasound and CT,  . GERD (gastroesophageal reflux disease)   . Hypertension   . Morbid obesity with BMI of 50.0-59.9, adult ([place]) 03/2013  . Rectal bleeding 05/2015   07/2015 colonoscopy with malignant appearing sigmoid mass. Additional rectal, descending, polyps  . Shoulder pain, bilateral    due to previous injury    Social History  Substance Use Topics  . Smoking status: Never Smoker  . Smokeless tobacco: Former Systems developer    Types: Chew     Comment: occasionally dips tobacco - has used for approx 20 yrs (12/21/15)  . Alcohol use Yes     Comment: heavy drinking for 10 years, 2-3  drinks of liqour a night, has been cutting back.      Family History  Problem Relation Age of Onset  . Diabetes Father   . Pancreatic cancer Father 27       former smoker, but had not smoked in 64 years  . Cancer Maternal Uncle        d. 1s; unknown type of cancer -metastatic throughout abdomen   . Other  Brother        oldest brother has had a normal colonoscopy  . Heart attack Maternal Grandfather        d. 57-58; heavy smoker and drinker  . Heart attack Paternal Grandmother        d. early 85s  . Heart attack Paternal Grandfather 42  . Heart failure Maternal Uncle   . Skin cancer Maternal Uncle 36  NOS type; was out in the sun a lot  . Brain cancer Maternal Uncle        NOS type; mother's maternal half-brother dx. 57-68  . Cirrhosis Paternal Uncle   . Cancer Paternal Uncle        (x2-3) uncles with NOS cancers; d. 47s    No Known Allergies  Objective: Vitals:   02/11/17 1351  BP: 110/81  Temp: 98.4 F (36.9 C)  TempSrc: Oral  Weight: (!) 356 lb (161.5 kg)  Height: _0  (1.905 m)   Body mass index is 44.5 kg/m.  Physical Exam  Constitutional:  He appears worried.  Cardiovascular: Normal rate and regular rhythm.   No murmur heard. Pulmonary/Chest: Effort normal and breath sounds normal.  Port-A-Cath site looks okay.  Abdominal: Soft. There is no tenderness.  No CVA tenderness.  Musculoskeletal:  He did not want his left foot wound examined as he just had a new dressing placed at Dr. Lorin Mercy office.    Lab Results    Problem List Items Addressed This Visit      High   Diabetic infection of left foot ([place])    I'm hopeful that his diabetic foot infection has now been cured with a combination of surgery to amputate his left fourth toe and 2 months of antibiotic therapy. I will discuss the situation with Dr. Lorin Mercy and get blood work today. He will follow-up with me in 4 weeks.      Relevant Orders   CBC   Basic metabolic panel   C-reactive protein   Sedimentation rate       Michel Bickers, MD [facility] for [facility] [place] [phone] pager   [phone] cell 02/11/2017, 2:24 PM

## 2017-02-12 ENCOUNTER — Telehealth: Payer: Self-pay | Admitting: Hematology

## 2017-02-12 ENCOUNTER — Other Ambulatory Visit: Payer: Self-pay | Admitting: Internal Medicine

## 2017-02-12 ENCOUNTER — Telehealth: Payer: Self-pay | Admitting: Internal Medicine

## 2017-02-12 DIAGNOSIS — E11628 Type 2 diabetes mellitus with other skin complications: Secondary | ICD-10-CM

## 2017-02-12 DIAGNOSIS — L089 Local infection of the skin and subcutaneous tissue, unspecified: Principal | ICD-10-CM

## 2017-02-12 LAB — BASIC METABOLIC PANEL
BUN: 14 mg/dL (ref 7–25)
CALCIUM: 9.3 mg/dL (ref 8.6–10.3)
CO2: 22 mmol/L (ref 20–31)
Chloride: 98 mmol/L (ref 98–110)
Creat: 1.04 mg/dL (ref 0.60–1.35)
GLUCOSE: 128 mg/dL — AB (ref 65–99)
Potassium: 4.1 mmol/L (ref 3.5–5.3)
SODIUM: 138 mmol/L (ref 135–146)

## 2017-02-12 LAB — SEDIMENTATION RATE: SED RATE: 120 mm/h — AB (ref 0–15)

## 2017-02-12 LAB — C-REACTIVE PROTEIN: CRP: 225.7 mg/L — AB (ref <8.0)

## 2017-02-12 MED ORDER — AMOXICILLIN-POT CLAVULANATE 875-125 MG PO TABS: 1.0000 | ORAL_TABLET | Freq: Two times a day (BID) | ORAL | 1 refills | Status: DC

## 2017-02-12 MED ORDER — DOXYCYCLINE HYCLATE 100 MG PO TABS: 100.0000 mg | ORAL_TABLET | Freq: Two times a day (BID) | ORAL | 1 refills | Status: AC

## 2017-02-12 NOTE — Telephone Encounter (Signed)
Scheduled appt epr 7/27 sch message - left message with appt date and time and sent reminder letter in the  Mail.

## 2017-02-12 NOTE — Telephone Encounter (Signed)
Lab Results  Component Value Date   WBC 12.8 (H) 02/11/2017   HGB 10.6 (L) 02/11/2017   HCT 33.1 (L) 02/11/2017   MCV 88.5 02/11/2017   PLT 368 02/11/2017   BMET    Component Value Date/Time   NA 138 02/11/2017 1512   NA 139 11/27/2016 0848   K 4.1 02/11/2017 1512   K 4.2 11/27/2016 0848   CL 98 02/11/2017 1512   CO2 22 02/11/2017 1512   CO2 25 11/27/2016 0848   GLUCOSE 128 (H) 02/11/2017 1512   GLUCOSE 270 (H) 11/27/2016 0848   BUN 14 02/11/2017 1512   BUN 12.1 11/27/2016 0848   CREATININE 1.04 02/11/2017 1512   CREATININE 1.1 11/27/2016 0848   CALCIUM 9.3 02/11/2017 1512   CALCIUM 9.6 11/27/2016 0848   GFRNONAA 46 (L) 12/18/2016 0435   GFRAA 53 (L) 12/18/2016 0435   Sed Rate (mm/hr)  Date Value  02/11/2017 120 (H)  12/12/2016 77 (H)   CRP  Date Value  02/11/2017 225.7 mg/L (H)  12/12/2016 18.4 mg/dL (H)   I spoke to Dr. Rodell Perna about Chad [name] condition. He states that the wound is smaller and not very deep with about 70% granulation tissue. Both of [place] agree that he is better but we cannot know with certainty whether or not his infection is cured. His inflammatory markers have actually gone up. I discussed treatment options again with Chad [name] by phone today. He would like to go back on his doxycycline and amoxicillin clavulanate for now.

## 2017-02-27 ENCOUNTER — Other Ambulatory Visit: Payer: BLUE CROSS/BLUE SHIELD

## 2017-02-27 ENCOUNTER — Ambulatory Visit: Payer: BLUE CROSS/BLUE SHIELD | Admitting: Hematology

## 2017-02-27 ENCOUNTER — Telehealth: Payer: Self-pay | Admitting: Hematology

## 2017-02-27 NOTE — Telephone Encounter (Signed)
Patient called to cancel his appointment today said he could not make the appointment.  He is rescheduled for 8/30 at 1:45 labs flush and Dr Burr Medico.  Please let me know if I need to make changes

## 2017-03-04 ENCOUNTER — Ambulatory Visit (INDEPENDENT_AMBULATORY_CARE_PROVIDER_SITE_OTHER): Payer: BLUE CROSS/BLUE SHIELD | Admitting: Orthopaedic Surgery

## 2017-03-05 ENCOUNTER — Ambulatory Visit (INDEPENDENT_AMBULATORY_CARE_PROVIDER_SITE_OTHER): Payer: BLUE CROSS/BLUE SHIELD | Admitting: Orthopaedic Surgery

## 2017-03-11 ENCOUNTER — Encounter (INDEPENDENT_AMBULATORY_CARE_PROVIDER_SITE_OTHER): Payer: Self-pay | Admitting: Orthopaedic Surgery

## 2017-03-11 ENCOUNTER — Ambulatory Visit (INDEPENDENT_AMBULATORY_CARE_PROVIDER_SITE_OTHER): Payer: BLUE CROSS/BLUE SHIELD | Admitting: Orthopaedic Surgery

## 2017-03-11 ENCOUNTER — Ambulatory Visit (INDEPENDENT_AMBULATORY_CARE_PROVIDER_SITE_OTHER): Payer: Self-pay

## 2017-03-11 VITALS — BP 126/83 | HR 94 | Ht 75.0 in | Wt 360.0 lb

## 2017-03-11 DIAGNOSIS — T8789 Other complications of amputation stump: Secondary | ICD-10-CM

## 2017-03-11 NOTE — Progress Notes (Signed)
Office Visit Note   Patient: Chad [name]           Date of Birth: October 23, 1970           MRN: [id number] Visit Date: 03/11/2017              Requested by: Shawnee Knapp, MD [address] [place], [place] [address] PCP: Shawnee Knapp, MD   Assessment & Plan: Visit Diagnoses:  1. Delayed surgical wound healing of toe amputation stump ([place])     Plan: Patient's been through chemotherapy for recurrent colon cancer. He has some metastases in the liver he discussed with me. His markers are elevated with sedimentation rate and CRP elevated. X-ray shows no obvious osteomyelitis. We'll recheck him in one month. We discussed possibly proceeding with an MRI but present since he is working still has  colon cancer problem he would like to continue to work and once the delay any further workup on his foot for a while so that he can continue to work. I'll recheck him in a month. He's been off antibiotics for 2 weeks and if he develops purulent drainage instead of the clear serous drainage she will call let us know.  Follow-Up Instructions: Return in about 1 month (around 04/11/2017).   Orders:  Orders Placed This Encounter  Procedures  . XR Foot Complete Left   No orders of the defined types were placed in this encounter.     Procedures: No procedures performed   Clinical Data: No additional findings.   Subjective: Chief Complaint  Patient presents with  . Left 4th Toe - Routine Post Op    HPI 46 year old male returns post left fourth toe amputation at the level of metatarsal neck. His been off antibodies for 2 weeks he still has some serous drainage is granulation present on the dorsum. No plantar foot lesions. Plantar areas completely healed. No pressure ulcers over the plantar surface of the foot. 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  Review of Systems unchanged from last office visit. Of note his history of metastatic cancer of  the left colon, type 2 diabetes with improved A1c and CBGs since his toe surgery., Hypertension, congestive heart failure.   Objective: Vital Signs: BP 126/83   Pulse 94   Ht 6\' 3"  (1.905 m)   Wt (!) 360 lb (163.3 kg)   BMI 45.00 kg/m   Physical Exam patient still has some serous drainage granulation tissue over the dorsum of the foot at the fourth  metatarsal amputation site. No cellulitis. No plantar foot lesions.  Ortho Exam  Specialty Comments:  No specialty comments available.  Imaging: Xr Foot Complete Left  Result Date: 03/11/2017 Three-view x-rays left foot obtained. This shows amputation fourth toe at the level metatarsal neck. There is some reactive bone present. No destruction is noted in the adjacent third or fifth toe and metatarsals adjacent to the area appear normal. Impression: Post amputation to the metatarsal neck. No obvious osteomyelitis.    PMFS History: Patient Active Problem List   Diagnosis Date Noted  . Gangrene of toe of left foot ([place]) 12/24/2016  . Normocytic anemia 12/16/2016  . Acute renal insufficiency 12/16/2016  . Metastatic cancer ([place])   . Sepsis ([place]) 12/11/2016  . Goals of care, counseling/discussion 09/07/2016  . Port catheter in place 09/03/2016  . Family history of pancreatic cancer 12/21/2015  . Membranous urethral stricture s/p balloon dilitatiopn  10/18/2015 10/18/2015  . S/P colon resection 10/18/2015  . Cancer of left colon ([place])   . Gastroesophageal reflux disease without esophagitis 06/15/2015  . Chronic systolic HF (heart failure) ([place]) 04/06/2015  . Coronary artery calcification 04/06/2015  . Calculus of gallbladder without cholecystitis without obstruction 01/14/2015  . Hepatic steatosis 01/14/2015  . Neuropathy due to type 2 diabetes mellitus ([place]) 01/14/2015  . Polypharmacy 01/14/2015  . History of alcohol abuse 01/14/2015  . Type 2 diabetes mellitus with hyperglycemia ([place]) 01/06/2015  . Morbid obesity ([place]) 01/06/2015    . Essential hypertension, benign 01/06/2015  . Microalbuminuria due to type 2 diabetes mellitus ([place]) 11/13/2014  . Diabetic infection of left foot ([place]) 03/15/2013   Past Medical History:  Diagnosis Date  . Cancer [facility])    colon cancer  . Coronary artery disease   . Diabetes mellitus without complication ([place]) 01/349   oral agents only (07/2015)  . ETOH abuse 2014  . Fatty liver 12/2014   on ultrasound. pt obese and abuses ETOH  . Gallstone 12/2014   GB stones and sludge on ultrasound and CT,  . GERD (gastroesophageal reflux disease)   . Hypertension   . Morbid obesity with BMI of 50.0-59.9, adult ([place]) 03/2013  . Rectal bleeding 05/2015   07/2015 colonoscopy with malignant appearing sigmoid mass. Additional rectal, descending, polyps  . Shoulder pain, bilateral    due to previous injury    Family History  Problem Relation Age of Onset  . Diabetes Father   . Pancreatic cancer Father 26       former smoker, but had not smoked in 73 years  . Cancer Maternal Uncle        d. 61s; unknown type of cancer -metastatic throughout abdomen   . Other Brother        oldest brother has had a normal colonoscopy  . Heart attack Maternal Grandfather        d. 57-58; heavy smoker and drinker  . Heart attack Paternal Grandmother        d. early 56s  . Heart attack Paternal Grandfather 40  . Heart failure Maternal Uncle   . Skin cancer Maternal Uncle 42       NOS type; was out in the sun a lot  . Brain cancer Maternal Uncle        NOS type; mother's maternal half-brother dx. 35-68  . Cirrhosis Paternal Uncle   . Cancer Paternal Uncle        (x2-3) uncles with NOS cancers; d. 69s    Past Surgical History:  Procedure Laterality Date  . AMPUTATION Left 12/13/2016   Procedure: LEFT FOURTH TOE AMPUTATION;  Surgeon: Marybelle Killings, MD;  [facility];  Service: Orthopedics;  Laterality: Left;  . CARDIAC CATHETERIZATION N/A 04/06/2015   Procedure: Left Heart Cath and Coronary Angiography;   Surgeon: Belva Crome, MD;  Location: [facility] CV LAB;  Service: Cardiovascular;  Laterality: N/A;  . COLONOSCOPY N/A 07/25/2015   Procedure: COLONOSCOPY;  Surgeon: Gatha Mayer, MD;  [facility];  Service: Gastroenterology;  Laterality: N/A;  . CYSTOSCOPY  10/18/2015   Procedure: CYSTOSCOPY FLEXIBLE URETHRAL DIALATION AND FOLEY PLACEMENT;  Surgeon: Alexis Frock, MD;  [facility];  Service: Urology;;  . ESOPHAGOGASTRODUODENOSCOPY N/A 07/25/2015   Procedure: ESOPHAGOGASTRODUODENOSCOPY (EGD);  Surgeon: Gatha Mayer, MD;  Location: [facility];  Service: Gastroenterology;  Laterality: N/A;  . PORTACATH PLACEMENT Right 08/01/2016   Procedure: INSERTION PORT-A-CATH WITH ULTRASOUND AND FLUOROSOPIC  GUIDANCE - RIGHT INTERNAL JUGULAR;  Surgeon: Michael Boston, MD;  Location: [facility];  Service: General;  Laterality: Right;   Social History   Occupational History  . Manager    Social History Main Topics  . Smoking status: Never Smoker  . Smokeless tobacco: Former Systems developer    Types: Chew     Comment: occasionally dips tobacco - has used for approx 20 yrs (12/21/15)  . Alcohol use Yes     Comment: heavy drinking for 10 years, 2-3  drinks of liqour a night, has been cutting back.    . Drug use: No  . Sexual activity: Not on file

## 2017-03-13 ENCOUNTER — Other Ambulatory Visit: Payer: Self-pay | Admitting: Family Medicine

## 2017-03-13 ENCOUNTER — Ambulatory Visit: Payer: BLUE CROSS/BLUE SHIELD | Admitting: Hematology

## 2017-03-13 ENCOUNTER — Other Ambulatory Visit: Payer: BLUE CROSS/BLUE SHIELD

## 2017-03-13 ENCOUNTER — Telehealth: Payer: Self-pay | Admitting: Hematology

## 2017-03-13 NOTE — Telephone Encounter (Signed)
Patient called and said there was no way he could make his appointment today that he would reschedule for whenever ASAP. He is not scheduled for Lab and Flush 9/5 and to see Dr Burr Medico 9/6 at 9:30  Let me know if I need to change anything.

## 2017-03-14 NOTE — Telephone Encounter (Signed)
SS refill req Trazodone Per chart research, historical provider 12/16/2016 Unsure of who prescribed Sent to Dr Brigitte Pulse to refill

## 2017-03-18 NOTE — Progress Notes (Signed)
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   Staging form: Colon and Rectum, AJCC 7th Edition     Pathologic stage from 10/18/2015: Stage IIIA (T2, N1a, cM0) - Signed by Truitt Merle, MD on 11/15/2015       Cancer of left colon ([place])   05/11/2015 Imaging    CT ABD/PELVIS: Gallstones w/sludge;liver prominent without focal liver lesion, mild thickening of bladder wall; negative for cancer      05/18/2015 Imaging    [place] ABD: Negative      07/25/2015 Initial Diagnosis    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 8 mm descending polyp and 5 mm rectosigmoid polyp;18-29 mm proximal rectal polyp      07/25/2015 Tumor Marker    CEA=0.7      07/26/2015 Pathology Results    Sigmoid mass: invasive colorectal adenocarcinoma      10/22/2015 Pathologic Stage    T2, N1, MO    #29 nodes examined w/ 1 postive node; moderately differentiated; Negative for Lymph-vascular and Peri-neural invasion; Microscopic extension into muscularis propria      10/26/2015 Pathology Results    MSI Stable      11/30/2015 - 02/08/2016 Chemotherapy    Adjuvant chemotherapy with Capecitabine 2500 mg twice daily, on  day 1-14, oxaliplatin 130 mg/m, every 21 days      01/19/2016 Genetic Testing    Patient has genetic testing done for young colon cancer. APC [id number] VUS identified on the Colorectal cancer panel.  The Colorectal Cancer Panel offered by [facility] includes sequencing and/or duplication/deletion testing of the following 19 genes: APC, ATM, AXIN2, BMPR1A, CDH1, CHEK2, EPCAM, MLH1, MSH2, MSH6, MUTYH, PMS2, POLD1, POLE, PTEN, SCG5/GREM1, SMAD4, STK11, and TP53.       05/01/2016 Imaging    Surveillance CT CAP scan showed new more focal low-density lesions in the liver, possible metastasis. No other new lesions.      05/01/2016 Progression    Surveillance CT scan showed probable liver metastasis, confirmed by liver mass biopsy       06/05/2016 Imaging    MR ABDOMEN W WO CONTRAST IMPRESSION: 1. 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 2. No additional metastatic disease in the upper abdomen. 3. Cholelithiasis.      06/24/2016 Pathology Results    US Biopsy Diagnosis Liver, needle/core biopsy, right lobe - METASTATIC ADENOCARCINOMA, CONSISTENT WITH COLORECTAL PRIMARY.      07/16/2016 Miscellaneous    Foundation One results received      07/18/2016 - 09/03/2016 Chemotherapy    Chemo CAPOX, every 3 weeks, started on 07/18/2016 and added Avastin, every 3 weeks, on 08/08/16. Oxaplatin held since 09/03/16 due to severe infusion reaction. Patient was to hold Xeloda from 08/09/16 and restart on 09/04/16.  10/02/2016 -  Chemotherapy    Chemotherapy FOLFIRI and Avastin starting 10/02/16, every 2 weeks. Held after 12/11/16 hospitalization due to diabetic foot and toe amputation.        10/15/2016 Imaging    CT CAP w/ contrast 10/15/16 IMPRESSION: 1. Unfortunately there is worsening of the hepatic metastatic burden with generally enlarging and scattered new hepatic metastatic lesions. 2. New right hydronephrosis, hydroureter, and delayed excretion on the right indicating  high-grade obstruction of the ureter. This obstruction appears to be near the iliac vessel cross over where there is abnormal mesenteric stranding and nodularity likely reflecting some residual tumor causing traction on or possibly even invading the right ureter. This may warrant ureteral stenting or nephrostomy. 3. Other imaging findings of potential clinical significance: Stable calcified pleural nodularity on the right, no change from 2014. Coronary atherosclerosis. Cholelithiasis. Stable mild extrahepatic biliary dilatation. Extensive bridging spurring in the thoracolumbar spine and sacroiliac joints.      10/15/2016 Progression    Restaging CT scan showed worsening liver metastasis, high-grade right ureter obstruction and hydronephrosis, concerning for peritoneal metastasis.      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 Lorin Mercy, treated with IV abx and PO abx. Pt was evaluated by ID.       Metastatic cancer ([place])    Initial Diagnosis    Metastatic cancer ([place])      HISTORY OF PRESENTING ILLNESS: 11/16/15 Chad [name] 46 y.o. male is here because of recently diagnosed colon cancer.  He is accompanied by his friend to the clinic today.  He presented with abdominal pain since Oct 2016,  Was seen in the emergency room on 05/11/2015,  CT abdomen and pelvis showed  Mild thickening of the urinary bladder wall, gallstones, otherwise negative. He was seen by her primary care physician Dr. Brigitte Pulse afterwards, who referred him to GI Dr. Carlean Purl.  His father passed away from [place] cancer around that time,  So his appointment was postponed, and he finally had colonoscopy in early January 2017, which showed multiple polyps, and  A ulcerated mass in distal sigmoid colon.  He was subsequently referred to  colorectal surgeon Dr. Johney Maine, and  Eventually had left colectomy  On 11/01/2015.  He tolerated surgery well, and recovered well. He has good appetite good, BM is normal most of time, occasional diarrhea, likely second to food, mild low abdomen pain, which has improves, he lost about 30 lbs before surgery and 10lbs after surgery.   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  He is divorced, he has a 72 year old daughter who lives with her mother in [place]. He has sisters and brothers, and her mother who live close by.   CURRENT THERAPY:  Chemotherapy FOLFIRI and Avastin every 2 weeks, started 10/02/16. Chemo has been held since 11/27/2016.   INTERIM HISTORY:  Of note, since the patient last visit, he was hospitalized from 12/11/2016-12/18/2016 for diabetic infection of his left foot with a wound vac in place that is now removed. He was treated with IV abx while hospitalized that was switched to PO Augmentin and doxycyline. Pt had his left fourth toe amputated by Dr. Lorin Mercy while hospitalized.   Turrell returns for follow-up today and reports that he has followed up with his Infectious Disease, Orthopedist, Dr.  Lorin Mercy, and PCP since his discharge from hospital. He states that he changes his dressing to his left 4th toe BID and uses we to dry bandages. He reports that he stopped taking augmentin and doxycyline 3 weeks ago due to feeling sick and inability to function. He states that he first ran this by one of his providers who reported that it would be up to the patient to d/c abx use and that prolonged use could build up resistance to the abx. Pt last visit with Infectious Disease was approximately 1 month ago. He states that he had discharge to his left foot that was evaluated by his PCP and tested positive for staph. He reports that he has pain to his left foot with prolonged ambulation. Pt voices that he would like to have a repeat scan completed and if the scan  doesn't show improvement, then he would like to take a different route. He states that his port was last flushed on yesterday. He reports that he is still working at this time and that he goes to visit his daughter up [place] at least 3 times a year.   On review of system, he reports mild discharge from the left foot. He reports intermittent right sided abdominal pain. He reports nausea. Pt reports dry cough. He denies fever. He reports weight loss. He states that he has recently been constipated (treateing with miralax).     MEDICAL HISTORY:  Past Medical History:  Diagnosis Date  . Cancer [facility])    colon cancer  . Coronary artery disease   . Diabetes mellitus without complication ([place]) 01/7823   oral agents only (07/2015)  . ETOH abuse 2014  . Fatty liver 12/2014   on ultrasound. pt obese and abuses ETOH  . Gallstone 12/2014   GB stones and sludge on ultrasound and CT,  . GERD (gastroesophageal reflux disease)   . Hypertension   . Morbid obesity with BMI of 50.0-59.9, adult ([place]) 03/2013  . Rectal bleeding 05/2015   07/2015 colonoscopy with malignant appearing sigmoid mass. Additional rectal, descending, polyps  . Shoulder pain, bilateral    due to previous injury    SURGICAL HISTORY: Past Surgical History:  Procedure Laterality Date  . AMPUTATION Left 12/13/2016   Procedure: LEFT FOURTH TOE AMPUTATION;  Surgeon: Marybelle Killings, MD;  Location: [facility];  Service: Orthopedics;  Laterality: Left;  . CARDIAC CATHETERIZATION N/A 04/06/2015   Procedure: Left Heart Cath and Coronary Angiography;  Surgeon: Belva Crome, MD;  Location: [facility];  Service: Cardiovascular;  Laterality: N/A;  . COLONOSCOPY N/A 07/25/2015   Procedure: COLONOSCOPY;  Surgeon: Gatha Mayer, MD;  Location: [facility];  Service: Gastroenterology;  Laterality: N/A;  . CYSTOSCOPY  10/18/2015   Procedure: CYSTOSCOPY FLEXIBLE URETHRAL DIALATION AND FOLEY PLACEMENT;  Surgeon: Alexis Frock, MD;  Location: [facility];   Service: Urology;;  . ESOPHAGOGASTRODUODENOSCOPY N/A 07/25/2015   Procedure: ESOPHAGOGASTRODUODENOSCOPY (EGD);  Surgeon: Gatha Mayer, MD;  Location: [facility];  Service: Gastroenterology;  Laterality: N/A;  . PORTACATH PLACEMENT Right 08/01/2016   Procedure: INSERTION PORT-A-CATH WITH ULTRASOUND AND FLUOROSOPIC GUIDANCE - RIGHT INTERNAL JUGULAR;  Surgeon: Michael Boston, MD;  Location: [facility];  Service: General;  Laterality: Right;    SOCIAL HISTORY: Social History   Social History  . Marital status: Legally Separated    Spouse name: N/A  . Number of children: 1  . Years of education: N/A   Occupational History  . Manager    Social  History Main Topics  . Smoking status: Never Smoker  . Smokeless tobacco: Former Systems developer    Types: Chew     Comment: occasionally dips tobacco - has used for approx 20 yrs (12/21/15)  . Alcohol use Yes     Comment: heavy drinking for 10 years, 2-3  drinks of liqour a night, has been cutting back.    . Drug use: No  . Sexual activity: Not on file   Other Topics Concern  . Not on file   Social History Narrative   Divorced, has 68 yo daughter lives in [place]   Lives alone   Manages chain of car wash dealers       FAMILY HISTORY: Family History  Problem Relation Age of Onset  . Diabetes Father   . Pancreatic cancer Father 60       former smoker, but had not smoked in 25 years  . Cancer Maternal Uncle        d. 9s; unknown type of cancer -metastatic throughout abdomen   . Other Brother        oldest brother has had a normal colonoscopy  . Heart attack Maternal Grandfather        d. 57-58; heavy smoker and drinker  . Heart attack Paternal Grandmother        d. early 42s  . Heart attack Paternal Grandfather 51  . Heart failure Maternal Uncle   . Skin cancer Maternal Uncle 42       NOS type; was out in the sun a lot  . Brain cancer Maternal Uncle        NOS type; mother's maternal half-brother dx. 47-68  . Cirrhosis Paternal Uncle   . Cancer  Paternal Uncle        (x2-3) uncles with NOS cancers; d. 43s    ALLERGIES:  has No Known Allergies.  MEDICATIONS:  Current Outpatient Prescriptions  Medication Sig Dispense Refill  . acetaminophen (TYLENOL) 500 MG tablet Take 1,000 mg by mouth every 6 (six) hours as needed for moderate pain or headache.    Marland Kitchen aspirin EC 81 MG tablet Take 1 tablet (81 mg total) by mouth daily. DO NOT TAKE AGAIN UNTIL 08/09/15    . blood glucose meter kit and supplies KIT Dispense based on patient and insurance preference. Use up to four times daily as directed. (FOR ICD-9 250.00, 250.01). 1 each 0  . carvedilol (COREG CR) 20 MG 24 hr capsule Take 20 mg by mouth daily.    Marland Kitchen glucose blood test strip One touch strips test bid Dx. dm 200 each 3  . Insulin Glargine (BASAGLAR KWIKPEN) 100 UNIT/ML SOPN Inject 0.4 mLs (40 Units total) into the skin at bedtime. 15 mL 1  . Insulin Pen Needle (PEN NEEDLES) 32G X 4 MM MISC 1 Device by Does not apply route daily. 100 each 11  . JANUMET 50-1000 MG tablet   2  . lidocaine-prilocaine (EMLA) cream Apply 1 application topically as needed. 30 g 2  . lisinopril-hydrochlorothiazide (PRINZIDE,ZESTORETIC) 20-25 MG tablet TK 1 T PO D  0  . omeprazole (PRILOSEC) 40 MG capsule TAKE 1 CAPSULE(40 MG) BY MOUTH DAILY 90 capsule 3  . traZODone (DESYREL) 50 MG tablet TAKE 1/2 TABLET EVERY MORNING AND 1 AND 1/2 TABLET EVERY NIGHT AT BEDTIME 180 tablet 0  . clotrimazole (LOTRIMIN) 1 % cream Apply topically 2 (two) times daily. (Patient not taking: Reported on 12/31/2016) 30 g 0  . doxycycline (VIBRA-TABS) 100 MG tablet Take  1 tablet (100 mg total) by mouth 2 (two) times daily. (Patient not taking: Reported on 03/11/2017) 60 tablet 1  . ondansetron (ZOFRAN) 8 MG tablet Take 1 tablet (8 mg total) by mouth every 8 (eight) hours as needed for nausea or vomiting. (Patient not taking: Reported on 03/20/2017) 30 tablet 0  . oxyCODONE-acetaminophen (PERCOCET/ROXICET) 5-325 MG tablet Take 1 tablet by mouth  every 6 (six) hours as needed for moderate pain. (Patient not taking: Reported on 03/20/2017) 20 tablet 0  . prochlorperazine (COMPAZINE) 10 MG tablet Take 1 tablet (10 mg total) by mouth every 6 (six) hours as needed for nausea or vomiting. (Patient not taking: Reported on 03/20/2017) 30 tablet 2   No current facility-administered medications for this visit.    Facility-Administered Medications Ordered in Other Visits  Medication Dose Route Frequency Provider Last Rate Last Dose  . sodium chloride flush (NS) 0.9 % injection 10 mL  10 mL Intracatheter PRN Truitt Merle, MD   10 mL at 10/18/16 1432    REVIEW OF SYSTEMS:   Constitutional: Denies fevers, chills or abnormal night sweats (+) weight loss  Eyes: Denies blurriness of vision, double vision or watery eyes Ears, nose, mouth, throat, and face: Denies mucositis or sore throat Respiratory: Denies cough, dyspnea or wheezes. Cardiovascular: Denies palpitation, chest discomfort or lower extremity swelling Gastrointestinal:  Denies heartburn. (+) Constipation. (+) Nausea (+) right sided abdominal pain Skin: Denies abnormal skin rashes Lymphatics: Denies new lymphadenopathy or easy bruising Neurological:Denies numbness, or new weaknesses Behavioral/Psych: Mood is stable, no new changes  All other systems were reviewed with the patient and are negative.  PHYSICAL EXAMINATION:  ECOG PERFORMANCE STATUS: 1 Vitals:   03/20/17 1010  BP: 113/65  Pulse: 94  Resp: 18  Temp: 98.2 F (36.8 C)  TempSrc: Oral  SpO2: 99%  Weight: (!) 344 lb (156 kg)  Height: '6\' 3"'$  (1.905 m)     GENERAL:alert,  Morbidly obese. SKIN: skin color, texture, turgor are normal, no rashes or significant lesions EYES: normal, conjunctiva are pink and non-injected, sclera clear OROPHARYNX:no exudate, no erythema and lips, buccal mucosa, and tongue normal  NECK: supple, thyroid normal size, non-tender, without nodularity LYMPH:  no palpable lymphadenopathy in the cervical,  axillary or inguinal LUNGS: clear to auscultation and percussion with normal breathing effort HEART: regular rate & rhythm and no murmurs and no lower extremity edema ABDOMEN: abdomen soft,  Surgical incision and laparoscopic scars are well-healed. non-tender and normal bowel sounds  Musculoskeletal: no cyanosis of digits and no clubbing  PSYCH: alert & oriented x 3 with fluent speech NEURO: no focal motor/sensory deficits  LABORATORY DATA:  I have reviewed the data as listed CBC Latest Ref Rng & Units 03/19/2017 02/11/2017 12/16/2016  WBC 4.0 - 10.3 10e3/uL 13.1(H) 12.8(H) 8.1  Hemoglobin 13.0 - 17.1 g/dL 10.1(L) 10.6(L) 9.8(L)  Hematocrit 38.4 - 49.9 % 32.8(L) 33.1(L) 29.5(L)  Platelets 140 - 400 10e3/uL 457(H) 368 314    CMP Latest Ref Rng & Units 03/19/2017 02/11/2017 12/18/2016  Glucose 70 - 140 mg/dl 176(H) 128(H) 140(H)  BUN 7.0 - 26.0 mg/dL 12.'3 14 15  '$ Creatinine 0.7 - 1.3 mg/dL 1.1 1.04 1.70(H)  Sodium 136 - 145 mEq/L 137 138 143  Potassium 3.5 - 5.1 mEq/L 4.1 4.1 3.7  Chloride 98 - 110 mmol/L - 98 109  CO2 22 - 29 mEq/L '25 22 28  '$ Calcium 8.4 - 10.4 mg/dL 9.9 9.3 8.4(L)  Total Protein 6.4 - 8.3 g/dL 8.4(H) - -  Total Bilirubin 0.20 - 1.20 mg/dL 5.41 - -  Alkaline Phos 40 - 150 U/L 266(H) - -  AST 5 - 34 U/L 41(H) - -  ALT 0 - 55 U/L 12 - -   CEA:  07/25/2015: 0.7 11/30/2015: 4.5 05/01/2016: 57.89 (64.7)  07/18/2016: 329.87 (310.0) 09/03/2016: 589 10/02/2016: 676.76 10/30/2016: 496.12   PATHOLOGY REPORT: US Biopsy 06/24/2016 Diagnosis Liver, needle/core biopsy, right lobe - METASTATIC ADENOCARCINOMA, CONSISTENT WITH COLORECTAL PRIMARY. - SEE COMMENT.  Diagnosis 10/18/2015 1. Colon, segmental resection for tumor, rectosigmoid INFILTRATIVE MODERATELY DIFFERENTIATED ADENOCARCINOMA OF THE COLON (3.8 CM) THE TUMOR INVADES MUSCULARIS PROPRIA MARGINS OF RESECTION ARE NEGATIVE FOR TUMOR METASTATIC COLONIC ADENOCARCINOMA IN ONE OF TWENTY-NINE LYMPH NODES (1/29) 2. Colon, resection  margin (donut), final distal margin BENIGN COLONIC TISSUE, NEGATIVE FOR CARCINOMA      Diagnosis 07/25/2015 1. Colon, polyp(s), descending - TUBULAR ADENOMA. NO HIGH GRADE DYSPLASIA OR MALIGNANCY IDENTIFIED. 2. Colon, biopsy, distal sigmoid mass - INVASIVE COLORECTAL ADENOCARCINOMA. 3. Colon, polyp(s), small recto sigmoid - HYPERPLASTIC POLYP. NO ADENOMATOUS CHANGE OR MALIGNANCY. 4. Rectum, polyp(s) - TUBULOVILLOUS ADENOMA. NO HIGH GRADE DYSPLASIA OR MALIGNANCY IDENTIFIED.   RADIOGRAPHIC STUDIES: I have personally reviewed the radiological images as listed and agreed with the findings in the report.  CT CAP w/ contrast 10/15/16 IMPRESSION: 1. Unfortunately there is worsening of the hepatic metastatic burden with generally enlarging and scattered new hepatic metastatic lesions. 2. New right hydronephrosis, hydroureter, and delayed excretion on the right indicating high-grade obstruction of the ureter. This obstruction appears to be near the iliac vessel cross over where there is abnormal mesenteric stranding and nodularity likely reflecting some residual tumor causing traction on or possibly even invading the right ureter. This may warrant ureteral stenting or nephrostomy. 3. Other imaging findings of potential clinical significance: Stable calcified pleural nodularity on the right, no change from 2014. Coronary atherosclerosis. Cholelithiasis. Stable mild extrahepatic biliary dilatation. Extensive bridging spurring in the thoracolumbar spine and sacroiliac joints.  Abdominal MRI w wo contrast 06/05/2016 IMPRESSION: 1. 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 2. No additional metastatic disease in the upper abdomen. 3. Cholelithiasis.  CT chest, abdomen and pelvis w contrast 05/01/2016 IMPRESSION: 1. Stable chest CT.  No evidence of thoracic metastatic disease. 2. Progressive heterogeneity of the liver could be due to heterogeneous steatosis.  However, there are new more focal low-density lesions which are worrisome for possible metastatic disease given the patient's history. Further evaluation with hepatic MRI recommended. 3. Resolving postsurgical retroperitoneal fluid collections status [name] partial colon resection and anastomosis.  COLONOSCOPY 07/25/2015 ENDOSCOPIC IMPRESSION: 1) 1/4 circumference firm ulcerated mass in distal sigmmoid - approx 22 cm from verge. Looks like cancer. Biopsied. 2) 8 mm descending polyp and 5 mm rectosigmoid polyp removed cold snare, completely recovered and sent to path. 3) 18-20 mm pedunculated proximal rectal polyp removed hot snare and completely recovered for pathology. 4) Otherwise normal colonoscopy RECOMMENDATIONS: 1. Hold Aspirin and all other NSAIDS for 2 weeks. 2. Will call pathology results and plans though anticipate surgical referral after pathology in. CEA will be drawn at hospital today. He has had recent CT's of chest, and abd/pelvis - will see if he needs new ones - he may.  EGD 07/25/2015 ENDOSCOPIC IMPRESSION: Normal appearing esophagus and GE junction, the stomach was well visualized and normal in appearance, normal appearing duodenum  ASSESSMENT & PLAN:  46 y.o.  Caucasian male, with past medical history of heavy alcohol drinking, hypertension, diabetes, coronary artery disease, presented with abdominal  pain.  1.  Cancer of left colon, Distal sigmoid, invasive adenocarcinoma, G2, pT2N1aM0, stage IIIA, MSI-stable, liver metastasis in 04/2016, possible peritoneal mets in 10/2016 -I initially reviewed his CT scan findings, and the surgical pathology findings include details with patient. -he has locally advanced sigmoid colon cancer, with one out of 29 lymph nodes positive. -He has completed 3 months of adjuvant chemotherapy CAPOX, tolerated well. -He unfortunately developed metastatic disease right after he completes adjuvant chemotherapy. MRI confirmed at least a 6 metastatic  lesions in the liver, largest 4.2 cm. Giving the multiple liver metastasis, this is likely incurable metastatic cancer. -He underwent ultrasound-guided liver mass biopsy to confirm metastasis, which revealed metastatic adenocarcinoma consistent with a colorectal primary.  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 He is not a candidate for immunotherapy, and would not benefit from EGFR inhibitor. -He was on first-line chemotherapy CAPOX and avastin, unfortunately he developed severe infusion reaction to oxaliplatin on 08/08/16, and oxliplatin has been held since then.  -I previously recommended changing his chemotherapy to FOLFIRI and Avastin started on 10/02/16. --Previously his restaging CT scan was delayed a few times, finally was done on 10/15/16, which showed worsening hepatic metastatic burden and new right hydronephrosis secondary to ureter obstruction, possible related to mesenteric metastasis which is new. I have previously changed his chemotherapy regiment a few weeks before the scan. -His chemo has been held since 11/27/2016 due to his hospitalization and surgery for left toe osteomyelitis and amputation. He is a prolonged course of antibiotics and recovery. His wound is healing well, off antibiotics now. -Patient is agreeable to restart treatment for his cancer. -Plan to repeat restaging scans in 1 week prior to restart chemotherapy FOLFIRI. I'll hold on Avastin for Due to his open wound at left toe.  -F/u on 9/14 -labs, flush, chemo FOLFIRI on 9/17   2. Right ureter obstruction and hydronephrosis -This was found on the recent CT scan. His creatinine is stable -This is likely secondary to his new peritoneal metastasis -Urgent referral was previously made to urology, to be seen within a week. He was seen by Dr. Tresa Moore before, he had a ureter stent placed during his colon surgery and was subsequently removed.  -He previously canceled his appointment last week due to the  conflict with his work schedule, he has rescheduled with Dr. Tresa Moore in a few weeks    3. Genetics -per NCCN guideline, due to his young age and family history of pancreatic cancer, he is qualified for genetic testing to ruled out inheritable genetic syndromes. -His genetic testing was negative. APC VUS called [id number] found on the colorectal cancer panel.  The Colorectal Cancer Panel offered by [facility] includes sequencing and/or duplication/deletion testing of the following 19 genes: APC, ATM, AXIN2, BMPR1A, CDH1, CHEK2, EPCAM, MLH1, MSH2, MSH6, MUTYH, PMS2, POLD1, POLE, PTEN, SCG5/GREM1, SMAD4, STK11, and TP53.  4.  HTN, DM, CAD, morbid obesity -he will continue follow-up with his primary care physician -We will monitor his blood pressure, glucose closely during the chemotherapy, and may need to adjust his the medication if needed. -I strongly encouraged him to see Dr. Brigitte Pulse to adjust his diabetic medication due to his uncontrolled hyperglycemia and hypertension --his BG again is high, 204 today, he will refill his glipizide, and monitor his BG at home. -We previously discussed his premedication dexamethasone will increase his blood sugar, diabetic diet discussed again. -PCP restarted his metformin and stopped Glucozide. He still takes his Lantus injections.  -Previously his BP was  high 181/91 (11/13/16) and will recheck in infusion room and if still high will give medication to lower it.  5. History of alcohol abuse -He has previously stopped alcohol completely since he started chemotherapy.   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  -Near resolved now  7. Coping and social issues  -The patient is having a hard time with his diagnosis.  -I previously offered social work to help whenever he is in need of it. He declined at that time.  8. Depression  -he has been depressed previously due to his cancer progression  -He previously states he has good social support  from his progress in the muscle, he denies suicidal idea. He declined antidepressant medication -I previously encouraged him to Dr. [employer], [employer] for counseling, he will think about it.  9. 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 -He will continue follow-up with his podiatrist Dr. Lorin Mercy   10. Goal of care discussion  -We again discussed the incurable nature of his cancer, and the overall poor prognosis, especially if he does not have good response to chemotherapy or progress on chemo -The patient understands the goal of care is palliative. -I previously recommended DNR/DNI, he will think about it   Plan -CT CAP in 1 week -follow up 9/14 -lab, flush, chemo FOLFIRI on 9/17 or 9/19  All questions were answered. The patient knows to call the clinic with any problems, questions or concerns.  I spent 25 minutes counseling the patient face to face. The total time spent in the appointment was 30 minutes and more than 50% was on counseling.   Truitt Merle, MD 03/20/2017   This document serves as a record of services personally performed by Truitt Merle, MD. It was created on her behalf by Steva Colder, a trained medical scribe. The creation of this record is based on the scribe's personal observations and the provider's statements to them. This document has been checked and approved by the attending provider.

## 2017-03-19 ENCOUNTER — Ambulatory Visit (HOSPITAL_BASED_OUTPATIENT_CLINIC_OR_DEPARTMENT_OTHER): Payer: BLUE CROSS/BLUE SHIELD

## 2017-03-19 ENCOUNTER — Other Ambulatory Visit (HOSPITAL_BASED_OUTPATIENT_CLINIC_OR_DEPARTMENT_OTHER): Payer: BLUE CROSS/BLUE SHIELD

## 2017-03-19 DIAGNOSIS — C186 Malignant neoplasm of descending colon: Secondary | ICD-10-CM | POA: Diagnosis not present

## 2017-03-19 DIAGNOSIS — C19 Malignant neoplasm of rectosigmoid junction: Secondary | ICD-10-CM

## 2017-03-19 DIAGNOSIS — Z95828 Presence of other vascular implants and grafts: Secondary | ICD-10-CM

## 2017-03-19 LAB — COMPREHENSIVE METABOLIC PANEL
ALT: 12 U/L (ref 0–55)
AST: 41 U/L — ABNORMAL HIGH (ref 5–34)
Albumin: 2.5 g/dL — ABNORMAL LOW (ref 3.5–5.0)
Alkaline Phosphatase: 266 U/L — ABNORMAL HIGH (ref 40–150)
Anion Gap: 12 mEq/L — ABNORMAL HIGH (ref 3–11)
BILIRUBIN TOTAL: 0.63 mg/dL (ref 0.20–1.20)
BUN: 12.3 mg/dL (ref 7.0–26.0)
CHLORIDE: 100 meq/L (ref 98–109)
CO2: 25 meq/L (ref 22–29)
Calcium: 9.9 mg/dL (ref 8.4–10.4)
Creatinine: 1.1 mg/dL (ref 0.7–1.3)
EGFR: 82 mL/min/{1.73_m2} — AB (ref >90)
GLUCOSE: 176 mg/dL — AB (ref 70–140)
POTASSIUM: 4.1 meq/L (ref 3.5–5.1)
SODIUM: 137 meq/L (ref 136–145)
TOTAL PROTEIN: 8.4 g/dL — AB (ref 6.4–8.3)

## 2017-03-19 LAB — CBC WITH DIFFERENTIAL/PLATELET
BASO%: 0.3 % (ref 0.0–2.0)
Basophils Absolute: 0 10*3/uL (ref 0.0–0.1)
EOS ABS: 0.4 10*3/uL (ref 0.0–0.5)
EOS%: 3.3 % (ref 0.0–7.0)
HCT: 32.8 % — ABNORMAL LOW (ref 38.4–49.9)
HGB: 10.1 g/dL — ABNORMAL LOW (ref 13.0–17.1)
LYMPH%: 21 % (ref 14.0–49.0)
MCH: 26.8 pg — ABNORMAL LOW (ref 27.2–33.4)
MCHC: 30.8 g/dL — ABNORMAL LOW (ref 32.0–36.0)
MCV: 87 fL (ref 79.3–98.0)
MONO#: 1.5 10*3/uL — ABNORMAL HIGH (ref 0.1–0.9)
MONO%: 11.2 % (ref 0.0–14.0)
NEUT%: 64.2 % (ref 39.0–75.0)
NEUTROS ABS: 8.4 10*3/uL — AB (ref 1.5–6.5)
Platelets: 457 10*3/uL — ABNORMAL HIGH (ref 140–400)
RBC: 3.77 10*6/uL — AB (ref 4.20–5.82)
RDW: 15.4 % — ABNORMAL HIGH (ref 11.0–14.6)
WBC: 13.1 10*3/uL — AB (ref 4.0–10.3)
lymph#: 2.8 10*3/uL (ref 0.9–3.3)

## 2017-03-19 LAB — CEA (IN HOUSE-CHCC): CEA (CHCC-In House): 946.39 ng/mL — ABNORMAL HIGH (ref 0.00–5.00)

## 2017-03-19 MED ORDER — SODIUM CHLORIDE 0.9% FLUSH
10.0000 mL | INTRAVENOUS | Status: DC | PRN
  Administered 2017-03-19: 10 mL via INTRAVENOUS
  Filled 2017-03-19: qty 10

## 2017-03-19 MED ORDER — HEPARIN SOD (PORK) LOCK FLUSH 100 UNIT/ML IV SOLN
500.0000 [IU] | Freq: Once | INTRAVENOUS | Status: AC | PRN
  Administered 2017-03-19: 500 [IU] via INTRAVENOUS
  Filled 2017-03-19: qty 5

## 2017-03-20 ENCOUNTER — Encounter: Payer: Self-pay | Admitting: Hematology

## 2017-03-20 ENCOUNTER — Ambulatory Visit (HOSPITAL_BASED_OUTPATIENT_CLINIC_OR_DEPARTMENT_OTHER): Payer: BLUE CROSS/BLUE SHIELD | Admitting: Hematology

## 2017-03-20 VITALS — BP 113/65 | HR 94 | Temp 98.2°F | Resp 18 | Ht 75.0 in | Wt 344.0 lb

## 2017-03-20 DIAGNOSIS — M869 Osteomyelitis, unspecified: Secondary | ICD-10-CM | POA: Diagnosis not present

## 2017-03-20 DIAGNOSIS — G620 Drug-induced polyneuropathy: Secondary | ICD-10-CM | POA: Diagnosis not present

## 2017-03-20 DIAGNOSIS — N135 Crossing vessel and stricture of ureter without hydronephrosis: Secondary | ICD-10-CM | POA: Diagnosis not present

## 2017-03-20 DIAGNOSIS — Z89422 Acquired absence of other left toe(s): Secondary | ICD-10-CM | POA: Diagnosis not present

## 2017-03-20 DIAGNOSIS — N1330 Unspecified hydronephrosis: Secondary | ICD-10-CM | POA: Diagnosis not present

## 2017-03-20 DIAGNOSIS — I10 Essential (primary) hypertension: Secondary | ICD-10-CM

## 2017-03-20 DIAGNOSIS — C186 Malignant neoplasm of descending colon: Secondary | ICD-10-CM

## 2017-03-20 DIAGNOSIS — E1165 Type 2 diabetes mellitus with hyperglycemia: Secondary | ICD-10-CM

## 2017-03-20 DIAGNOSIS — C787 Secondary malignant neoplasm of liver and intrahepatic bile duct: Secondary | ICD-10-CM | POA: Diagnosis not present

## 2017-03-20 DIAGNOSIS — C786 Secondary malignant neoplasm of retroperitoneum and peritoneum: Secondary | ICD-10-CM

## 2017-03-20 DIAGNOSIS — F329 Major depressive disorder, single episode, unspecified: Secondary | ICD-10-CM

## 2017-03-20 DIAGNOSIS — E119 Type 2 diabetes mellitus without complications: Secondary | ICD-10-CM | POA: Diagnosis not present

## 2017-03-20 DIAGNOSIS — D649 Anemia, unspecified: Secondary | ICD-10-CM

## 2017-03-20 MED ORDER — PROCHLORPERAZINE MALEATE 10 MG PO TABS: 10.0000 mg | ORAL_TABLET | Freq: Four times a day (QID) | ORAL | 2 refills | Status: AC | PRN

## 2017-03-20 MED ORDER — ONDANSETRON HCL 8 MG PO TABS: 8.0000 mg | ORAL_TABLET | Freq: Three times a day (TID) | ORAL | 2 refills | Status: AC | PRN

## 2017-03-22 ENCOUNTER — Encounter: Payer: Self-pay | Admitting: Hematology

## 2017-03-25 ENCOUNTER — Ambulatory Visit (HOSPITAL_COMMUNITY)
Admission: RE | Admit: 2017-03-25 | Discharge: 2017-03-25 | Disposition: A | Discharge status: Home / Self Care | Payer: BLUE CROSS/BLUE SHIELD | Source: Ambulatory Visit | Attending: Nurse Practitioner | Admitting: Nurse Practitioner

## 2017-03-25 ENCOUNTER — Encounter (HOSPITAL_COMMUNITY): Payer: Self-pay

## 2017-03-25 ENCOUNTER — Telehealth: Payer: Self-pay | Admitting: Hematology

## 2017-03-25 ENCOUNTER — Ambulatory Visit: Payer: BLUE CROSS/BLUE SHIELD | Admitting: Internal Medicine

## 2017-03-25 DIAGNOSIS — N1330 Unspecified hydronephrosis: Secondary | ICD-10-CM | POA: Insufficient documentation

## 2017-03-25 DIAGNOSIS — K8020 Calculus of gallbladder without cholecystitis without obstruction: Secondary | ICD-10-CM | POA: Insufficient documentation

## 2017-03-25 DIAGNOSIS — C186 Malignant neoplasm of descending colon: Secondary | ICD-10-CM | POA: Diagnosis not present

## 2017-03-25 DIAGNOSIS — C787 Secondary malignant neoplasm of liver and intrahepatic bile duct: Secondary | ICD-10-CM | POA: Diagnosis not present

## 2017-03-25 DIAGNOSIS — I2510 Atherosclerotic heart disease of native coronary artery without angina pectoris: Secondary | ICD-10-CM | POA: Diagnosis not present

## 2017-03-25 DIAGNOSIS — R188 Other ascites: Secondary | ICD-10-CM | POA: Insufficient documentation

## 2017-03-25 MED ORDER — IOPAMIDOL (ISOVUE-300) INJECTION 61%
INTRAVENOUS | Status: AC
  Administered 2017-03-25: 100 mL via INTRAVENOUS
  Filled 2017-03-25: qty 100

## 2017-03-25 MED ORDER — IOPAMIDOL (ISOVUE-300) INJECTION 61%
100.0000 mL | Freq: Once | INTRAVENOUS | Status: AC | PRN
  Administered 2017-03-25: 100 mL via INTRAVENOUS

## 2017-03-25 NOTE — Telephone Encounter (Signed)
Spoke to patient regarding upcoming September appointments. °

## 2017-03-26 NOTE — Progress Notes (Signed)
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   Staging form: Colon and Rectum, AJCC 7th Edition     Pathologic stage from 10/18/2015: Stage IIIA (T2, N1a, cM0) - Signed by Truitt Merle, MD on 11/15/2015       Cancer of left colon ([place])   05/11/2015 Imaging    CT ABD/PELVIS: Gallstones w/sludge;liver prominent without focal liver lesion, mild thickening of bladder wall; negative for cancer      05/18/2015 Imaging    [place] ABD: Negative      07/25/2015 Initial Diagnosis    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 8 mm descending polyp and 5 mm rectosigmoid polyp;18-29 mm proximal rectal polyp      07/25/2015 Tumor Marker    CEA=0.7      07/26/2015 Pathology Results    Sigmoid mass: invasive colorectal adenocarcinoma      10/22/2015 Pathologic Stage    T2, N1, MO    #29 nodes examined w/ 1 postive node; moderately differentiated; Negative for Lymph-vascular and Peri-neural invasion; Microscopic extension into muscularis propria      10/26/2015 Pathology Results    MSI Stable      11/30/2015 - 02/08/2016 Chemotherapy    Adjuvant chemotherapy with Capecitabine 2500 mg twice daily, on  day 1-14, oxaliplatin 130 mg/m, every 21 days      01/19/2016 Genetic Testing    Patient has genetic testing done for young colon cancer. APC [id number] VUS identified on the Colorectal cancer panel.  The Colorectal Cancer Panel offered by [facility] includes sequencing and/or duplication/deletion testing of the following 19 genes: APC, ATM, AXIN2, BMPR1A, CDH1, CHEK2, EPCAM, MLH1, MSH2, MSH6, MUTYH, PMS2, POLD1, POLE, PTEN, SCG5/GREM1, SMAD4, STK11, and TP53.       05/01/2016 Imaging    Surveillance CT CAP scan showed new more focal low-density lesions in the liver, possible metastasis. No other new lesions.      05/01/2016 Progression    Surveillance CT scan showed probable liver metastasis, confirmed by liver mass biopsy       06/05/2016 Imaging    MR ABDOMEN W WO CONTRAST IMPRESSION: 1. 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 2. No additional metastatic disease in the upper abdomen. 3. Cholelithiasis.      06/24/2016 Pathology Results    US Biopsy Diagnosis Liver, needle/core biopsy, right lobe - METASTATIC ADENOCARCINOMA, CONSISTENT WITH COLORECTAL PRIMARY.      07/16/2016 Miscellaneous    Foundation One results received      07/18/2016 - 09/03/2016 Chemotherapy    Chemo CAPOX, every 3 weeks, started on 07/18/2016 and added Avastin, every 3 weeks, on 08/08/16. Oxaplatin held since 09/03/16 due to severe infusion reaction. Patient was to hold Xeloda from 08/09/16 and restart on 09/04/16.  10/02/2016 -  Chemotherapy    Chemotherapy FOLFIRI and Avastin starting 10/02/16, every 2 weeks. Held after 12/11/16 hospitalization due to diabetic foot and toe amputation.        10/15/2016 Imaging    CT CAP w/ contrast 10/15/16 IMPRESSION: 1. Unfortunately there is worsening of the hepatic metastatic burden with generally enlarging and scattered new hepatic metastatic lesions. 2. New right hydronephrosis, hydroureter, and delayed excretion on the right indicating  high-grade obstruction of the ureter. This obstruction appears to be near the iliac vessel cross over where there is abnormal mesenteric stranding and nodularity likely reflecting some residual tumor causing traction on or possibly even invading the right ureter. This may warrant ureteral stenting or nephrostomy. 3. Other imaging findings of potential clinical significance: Stable calcified pleural nodularity on the right, no change from 2014. Coronary atherosclerosis. Cholelithiasis. Stable mild extrahepatic biliary dilatation. Extensive bridging spurring in the thoracolumbar spine and sacroiliac joints.      10/15/2016 Progression    Restaging CT scan showed worsening liver metastasis, high-grade right ureter obstruction and hydronephrosis, concerning for peritoneal metastasis.      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 Lorin Mercy, treated with IV abx and PO abx. Pt was evaluated by ID.      03/26/2017 Imaging    CT CAP 03/26/17 IMPRESSION: 1. Moderate disease progression. 2. Progressive hepatic metastasis. 3. Enlarging abdominal nodes, highly suspicious for nodal metastasis. 4. Similar moderate right hydroureteronephrosis secondary to an enlarging lesion either adjacent to or within the mid right ureter. 5. New small volume ascites. 6. New mild right cardiophrenic angle adenopathy is suspicious for nodal metastasis. Otherwise, no evidence of metastatic disease in the chest. 7. Significantly age advanced coronary artery atherosclerosis. 8. Cholelithiasis with equivocal pericholecystic edema. Given history of right upper quadrant pain, consider right upper quadrant ultrasound. 9. Chronic mild common duct dilatation.       Metastatic cancer ([place])    Initial Diagnosis    Metastatic cancer ([place])        HISTORY OF PRESENTING ILLNESS: 11/16/15 Chad [name] 46 y.o. male is here because of recently diagnosed colon cancer.  He is accompanied by his friend to the clinic today.  He presented with abdominal pain since Oct 2016,  Was seen in the emergency room on 05/11/2015,  CT abdomen and pelvis showed  Mild thickening of the urinary bladder wall, gallstones, otherwise negative. He was seen by her primary care physician Dr. Brigitte Pulse afterwards, who referred him to GI Dr. Carlean Purl.  His father passed away from [place] cancer around that time,  So his appointment was postponed, and he finally had colonoscopy in early January 2017, which showed multiple polyps, and  A ulcerated mass in distal sigmoid colon.  He was subsequently referred to colorectal surgeon Dr. Johney Maine, and  Eventually had left colectomy  On 11/01/2015.  He tolerated surgery well, and recovered well. He has good appetite good, BM is normal most of time, occasional diarrhea, likely second to food, mild low abdomen pain, which has improves, he lost about 30 lbs before surgery and 10lbs after surgery.   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  He is divorced, he has a 2 year old daughter who lives with her mother in [place]. He has sisters and brothers, and  her mother who live close by.   CURRENT THERAPY:  Chemotherapy FOLFIRI and Avastin every 2 weeks, started 10/02/16. Chemo has been held since 11/27/2016. Plan to restart on 04/17/17  INTERIM HISTORY:  Chad [name] returns for follow-up. He presents to the clinic today with his brother.  He reports to not being in pain but her has lost 6 pounds since last visit. His appetite comes and goes. He eats 1 meal a day. He has nausea and takes anti-nausea medication that does not help.  He reports to not sleeping well. He will try Mirtazapine.  He asks for help with energy so he is willing to take dexamethasone.  Based on the conversion about  his prognosis he will proceed with a DNR. He wishes to wait 3 more weeks before starting chemo again.   He has a dry cough when the air changes. This is tolerable for him.    MEDICAL HISTORY:  Past Medical History:  Diagnosis Date  . Cancer [facility])    colon cancer  . Coronary artery disease   . Diabetes mellitus without complication ([place]) 01/9023   oral agents only (07/2015)  . ETOH abuse 2014  . Fatty liver 12/2014   on ultrasound. pt obese and abuses ETOH  . Gallstone 12/2014   GB stones and sludge on ultrasound and CT,  . GERD (gastroesophageal reflux disease)   . Hypertension   . Morbid obesity with BMI of 50.0-59.9, adult ([place]) 03/2013  . Rectal bleeding 05/2015   07/2015 colonoscopy with malignant appearing sigmoid mass. Additional rectal, descending, polyps  . Shoulder pain, bilateral    due to previous injury    SURGICAL HISTORY: Past Surgical History:  Procedure Laterality Date  . AMPUTATION Left 12/13/2016   Procedure: LEFT FOURTH TOE AMPUTATION;  Surgeon: Marybelle Killings, MD;  Location: [facility];  Service: Orthopedics;  Laterality: Left;  . CARDIAC CATHETERIZATION N/A 04/06/2015   Procedure: Left Heart Cath and Coronary Angiography;  Surgeon: Belva Crome, MD;  Location: [facility];  Service: Cardiovascular;  Laterality: N/A;  . COLONOSCOPY N/A 07/25/2015   Procedure: COLONOSCOPY;  Surgeon: Gatha Mayer, MD;  Location: [facility];  Service: Gastroenterology;  Laterality: N/A;  . CYSTOSCOPY  10/18/2015   Procedure: CYSTOSCOPY FLEXIBLE URETHRAL DIALATION AND FOLEY PLACEMENT;  Surgeon: Alexis Frock, MD;  Location: [facility];  Service: Urology;;  . ESOPHAGOGASTRODUODENOSCOPY N/A 07/25/2015   Procedure: ESOPHAGOGASTRODUODENOSCOPY (EGD);  Surgeon: Gatha Mayer, MD;  Location: [facility];  Service: Gastroenterology;  Laterality: N/A;  . PORTACATH PLACEMENT Right 08/01/2016   Procedure: INSERTION PORT-A-CATH WITH ULTRASOUND AND FLUOROSOPIC GUIDANCE - RIGHT INTERNAL JUGULAR;   Surgeon: Michael Boston, MD;  Location: [facility];  Service: General;  Laterality: Right;    SOCIAL HISTORY: Social History   Social History  . Marital status: Legally Separated    Spouse name: N/A  . Number of children: 1  . Years of education: N/A   Occupational History  . Manager    Social History Main Topics  . Smoking status: Never Smoker  . Smokeless tobacco: Former Systems developer    Types: Chew     Comment: occasionally dips tobacco - has used for approx 20 yrs (12/21/15)  . Alcohol use Yes     Comment: heavy drinking for 10 years, 2-3  drinks of liqour a night, has been cutting back.    . Drug use: No  . Sexual activity: Not on file   Other Topics Concern  . Not on  file   Social History Narrative   Divorced, has 66 yo daughter lives in [place]   Lives alone   Manages chain of car wash dealers       FAMILY HISTORY: Family History  Problem Relation Age of Onset  . Diabetes Father   . Pancreatic cancer Father 84       former smoker, but had not smoked in 7 years  . Cancer Maternal Uncle        d. 30s; unknown type of cancer -metastatic throughout abdomen   . Other Brother        oldest brother has had a normal colonoscopy  . Heart attack Maternal Grandfather        d. 57-58; heavy smoker and drinker  . Heart attack Paternal Grandmother        d. early 34s  . Heart attack Paternal Grandfather 76  . Heart failure Maternal Uncle   . Skin cancer Maternal Uncle 42       NOS type; was out in the sun a lot  . Brain cancer Maternal Uncle        NOS type; mother's maternal half-brother dx. 46-68  . Cirrhosis Paternal Uncle   . Cancer Paternal Uncle        (x2-3) uncles with NOS cancers; d. 38s    ALLERGIES:  has No Known Allergies.  MEDICATIONS:  Current Outpatient Prescriptions  Medication Sig Dispense Refill  . acetaminophen (TYLENOL) 500 MG tablet Take 1,000 mg by mouth every 6 (six) hours as needed for moderate pain or headache.    Marland Kitchen aspirin EC 81 MG tablet Take 1 tablet  (81 mg total) by mouth daily. DO NOT TAKE AGAIN UNTIL 08/09/15    . blood glucose meter kit and supplies KIT Dispense based on patient and insurance preference. Use up to four times daily as directed. (FOR ICD-9 250.00, 250.01). 1 each 0  . carvedilol (COREG CR) 20 MG 24 hr capsule Take 20 mg by mouth daily.    . clotrimazole (LOTRIMIN) 1 % cream Apply topically 2 (two) times daily. 30 g 0  . doxycycline (VIBRA-TABS) 100 MG tablet Take 1 tablet (100 mg total) by mouth 2 (two) times daily. 60 tablet 1  . glucose blood test strip One touch strips test bid Dx. dm 200 each 3  . Insulin Glargine (BASAGLAR KWIKPEN) 100 UNIT/ML SOPN Inject 0.4 mLs (40 Units total) into the skin at bedtime. 15 mL 1  . Insulin Pen Needle (PEN NEEDLES) 32G X 4 MM MISC 1 Device by Does not apply route daily. 100 each 11  . JANUMET 50-1000 MG tablet   2  . lidocaine-prilocaine (EMLA) cream Apply 1 application topically as needed. 30 g 2  . lisinopril-hydrochlorothiazide (PRINZIDE,ZESTORETIC) 20-25 MG tablet TK 1 T PO D  0  . omeprazole (PRILOSEC) 40 MG capsule TAKE 1 CAPSULE(40 MG) BY MOUTH DAILY 90 capsule 3  . ondansetron (ZOFRAN) 8 MG tablet Take 1 tablet (8 mg total) by mouth every 8 (eight) hours as needed for nausea or vomiting. 30 tablet 2  . oxyCODONE-acetaminophen (PERCOCET/ROXICET) 5-325 MG tablet Take 1 tablet by mouth every 6 (six) hours as needed for moderate pain. 20 tablet 0  . prochlorperazine (COMPAZINE) 10 MG tablet Take 1 tablet (10 mg total) by mouth every 6 (six) hours as needed for nausea or vomiting. 30 tablet 2  . traZODone (DESYREL) 50 MG tablet TAKE 1/2 TABLET EVERY MORNING AND 1 AND 1/2 TABLET EVERY NIGHT AT BEDTIME  180 tablet 0  . dexamethasone (DECADRON) 4 MG tablet Take 1 tablet (4 mg total) by mouth daily. 20 tablet 0  . mirtazapine (REMERON) 15 MG tablet Take 1 tablet (15 mg total) by mouth at bedtime. 30 tablet 2   No current facility-administered medications for this visit.     Facility-Administered Medications Ordered in Other Visits  Medication Dose Route Frequency Provider Last Rate Last Dose  . sodium chloride flush (NS) 0.9 % injection 10 mL  10 mL Intracatheter PRN Truitt Merle, MD   10 mL at 10/18/16 1432    REVIEW OF SYSTEMS:  Constitutional: Denies fevers, chills or abnormal night sweats (+) weight loss  Eyes: Denies blurriness of vision, double vision or watery eyes Ears, nose, mouth, throat, and face: Denies mucositis or sore throat Respiratory: Denies dyspnea or wheezes. (+) occassional dry cough  Cardiovascular: Denies palpitation, chest discomfort or lower extremity swelling Gastrointestinal:  Denies heartburn. (+) Constipation. (+) Nausea (+) right sided abdominal pain Skin: Denies abnormal skin rashes Lymphatics: Denies new lymphadenopathy or easy bruising Neurological:Denies numbness, or new weaknesses Behavioral/Psych: Mood is stable, no new changes  All other systems were reviewed with the patient and are negative.  PHYSICAL EXAMINATION:  ECOG PERFORMANCE STATUS: 1 Vitals:   03/28/17 1302  BP: 138/81  Pulse: (!) 104  Resp: 17  Temp: 98.7 F (37.1 C)  TempSrc: Oral  Weight: (!) 338 lb (153.3 kg)  Height: '6\' 3"'$  (1.905 m)     GENERAL:alert,  Morbidly obese. SKIN: skin color, texture, turgor are normal, no rashes or significant lesions EYES: normal, conjunctiva are pink and non-injected, sclera clear OROPHARYNX:no exudate, no erythema and lips, buccal mucosa, and tongue normal  NECK: supple, thyroid normal size, non-tender, without nodularity LYMPH:  no palpable lymphadenopathy in the cervical, axillary or inguinal LUNGS: clear to auscultation and percussion with normal breathing effort HEART: regular rate & rhythm and no murmurs and no lower extremity edema ABDOMEN: abdomen soft,  Surgical incision and laparoscopic scars are well-healed. non-tender and normal bowel sounds  Musculoskeletal: no cyanosis of digits and no clubbing   PSYCH: alert & oriented x 3 with fluent speech NEURO: no focal motor/sensory deficits  LABORATORY DATA:  I have reviewed the data as listed CBC Latest Ref Rng & Units 03/19/2017 02/11/2017 12/16/2016  WBC 4.0 - 10.3 10e3/uL 13.1(H) 12.8(H) 8.1  Hemoglobin 13.0 - 17.1 g/dL 10.1(L) 10.6(L) 9.8(L)  Hematocrit 38.4 - 49.9 % 32.8(L) 33.1(L) 29.5(L)  Platelets 140 - 400 10e3/uL 457(H) 368 314    CMP Latest Ref Rng & Units 03/19/2017 02/11/2017 12/18/2016  Glucose 70 - 140 mg/dl 176(H) 128(H) 140(H)  BUN 7.0 - 26.0 mg/dL 12.'3 14 15  '$ Creatinine 0.7 - 1.3 mg/dL 1.1 1.04 1.70(H)  Sodium 136 - 145 mEq/L 137 138 143  Potassium 3.5 - 5.1 mEq/L 4.1 4.1 3.7  Chloride 98 - 110 mmol/L - 98 109  CO2 22 - 29 mEq/L '25 22 28  '$ Calcium 8.4 - 10.4 mg/dL 9.9 9.3 8.4(L)  Total Protein 6.4 - 8.3 g/dL 8.4(H) - -  Total Bilirubin 0.20 - 1.20 mg/dL 0.63 - -  Alkaline Phos 40 - 150 U/L 266(H) - -  AST 5 - 34 U/L 41(H) - -  ALT 0 - 55 U/L 12 - -    CEA:  07/25/2015: 0.7 11/30/2015: 4.5 05/01/2016: 57.89 (64.7)  07/18/2016: 329.87 (310.0) 09/03/2016: 589 10/02/2016: 676.76 10/30/2016: 496.12 11/27/16: 1,033.34 03/19/17: 946.39   PATHOLOGY REPORT: US Biopsy 06/24/2016 Diagnosis Liver, needle/core biopsy, right  lobe - METASTATIC ADENOCARCINOMA, CONSISTENT WITH COLORECTAL PRIMARY. - SEE COMMENT.  Diagnosis 10/18/2015 1. Colon, segmental resection for tumor, rectosigmoid INFILTRATIVE MODERATELY DIFFERENTIATED ADENOCARCINOMA OF THE COLON (3.8 CM) THE TUMOR INVADES MUSCULARIS PROPRIA MARGINS OF RESECTION ARE NEGATIVE FOR TUMOR METASTATIC COLONIC ADENOCARCINOMA IN ONE OF TWENTY-NINE LYMPH NODES (1/29) 2. Colon, resection margin (donut), final distal margin BENIGN COLONIC TISSUE, NEGATIVE FOR CARCINOMA      Diagnosis 07/25/2015 1. Colon, polyp(s), descending - TUBULAR ADENOMA. NO HIGH GRADE DYSPLASIA OR MALIGNANCY IDENTIFIED. 2. Colon, biopsy, distal sigmoid mass - INVASIVE COLORECTAL ADENOCARCINOMA. 3. Colon,  polyp(s), small recto sigmoid - HYPERPLASTIC POLYP. NO ADENOMATOUS CHANGE OR MALIGNANCY. 4. Rectum, polyp(s) - TUBULOVILLOUS ADENOMA. NO HIGH GRADE DYSPLASIA OR MALIGNANCY IDENTIFIED.   RADIOGRAPHIC STUDIES: I have personally reviewed the radiological images as listed and agreed with the findings in the report.   CT CAP 03/26/17 IMPRESSION: 1. Moderate disease progression. 2. Progressive hepatic metastasis. 3. Enlarging abdominal nodes, highly suspicious for nodal metastasis. 4. Similar moderate right hydroureteronephrosis secondary to an enlarging lesion either adjacent to or within the mid right ureter. 5. New small volume ascites. 6. New mild right cardiophrenic angle adenopathy is suspicious for nodal metastasis. Otherwise, no evidence of metastatic disease in the chest. 7. Significantly age advanced coronary artery atherosclerosis. 8. Cholelithiasis with equivocal pericholecystic edema. Given history of right upper quadrant pain, consider right upper quadrant ultrasound. 9. Chronic mild common duct dilatation.  CT CAP w/ contrast 10/15/16 IMPRESSION: 1. Unfortunately there is worsening of the hepatic metastatic burden with generally enlarging and scattered new hepatic metastatic lesions. 2. New right hydronephrosis, hydroureter, and delayed excretion on the right indicating high-grade obstruction of the ureter. This obstruction appears to be near the iliac vessel cross over where there is abnormal mesenteric stranding and nodularity likely reflecting some residual tumor causing traction on or possibly even invading the right ureter. This may warrant ureteral stenting or nephrostomy. 3. Other imaging findings of potential clinical significance: Stable calcified pleural nodularity on the right, no change from 2014. Coronary atherosclerosis. Cholelithiasis. Stable mild extrahepatic biliary dilatation. Extensive bridging spurring in the thoracolumbar spine and sacroiliac  joints.  Abdominal MRI w wo contrast 06/05/2016 IMPRESSION: 1. 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 2. No additional metastatic disease in the upper abdomen. 3. Cholelithiasis.  CT chest, abdomen and pelvis w contrast 05/01/2016 IMPRESSION: 1. Stable chest CT.  No evidence of thoracic metastatic disease. 2. Progressive heterogeneity of the liver could be due to heterogeneous steatosis. However, there are new more focal low-density lesions which are worrisome for possible metastatic disease given the patient's history. Further evaluation with hepatic MRI recommended. 3. Resolving postsurgical retroperitoneal fluid collections status [name] partial colon resection and anastomosis.  COLONOSCOPY 07/25/2015 ENDOSCOPIC IMPRESSION: 1) 1/4 circumference firm ulcerated mass in distal sigmmoid - approx 22 cm from verge. Looks like cancer. Biopsied. 2) 8 mm descending polyp and 5 mm rectosigmoid polyp removed cold snare, completely recovered and sent to path. 3) 18-20 mm pedunculated proximal rectal polyp removed hot snare and completely recovered for pathology. 4) Otherwise normal colonoscopy RECOMMENDATIONS: 1. Hold Aspirin and all other NSAIDS for 2 weeks. 2. Will call pathology results and plans though anticipate surgical referral after pathology in. CEA will be drawn at hospital today. He has had recent CT's of chest, and abd/pelvis - will see if he needs new ones - he may.  EGD 07/25/2015 ENDOSCOPIC IMPRESSION: Normal appearing esophagus and GE junction, the stomach was well visualized and normal in appearance,  normal appearing duodenum  ASSESSMENT & PLAN:  46 y.o.  Caucasian male, with past medical history of heavy alcohol drinking, hypertension, diabetes, coronary artery disease, presented with abdominal pain.  1.  Cancer of left colon, Distal sigmoid, invasive adenocarcinoma, G2, pT2N1aM0, stage IIIA, MSI-stable, liver metastasis in 04/2016, possible  peritoneal mets in 10/2016 -I initially reviewed his CT scan findings, and the surgical pathology findings include details with patient. -he has locally advanced sigmoid colon cancer, with one out of 29 lymph nodes positive. -He has completed 3 months of adjuvant chemotherapy CAPOX, tolerated well. -He unfortunately developed metastatic disease right after he completes adjuvant chemotherapy. MRI confirmed at least a 6 metastatic lesions in the liver, largest 4.2 cm. Giving the multiple liver metastasis, this is likely incurable metastatic cancer. -He underwent ultrasound-guided liver mass biopsy to confirm metastasis, which revealed metastatic adenocarcinoma consistent with a colorectal primary.  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 He is not a candidate for immunotherapy, and would not benefit from EGFR inhibitor. -He was on first-line chemotherapy CAPOX and avastin, unfortunately he developed severe infusion reaction to oxaliplatin on 08/08/16, and oxliplatin has been held since then.  -he started second line chemo FOLFIRI and Avastin on 10/02/16. His chemo has been held since 11/27/2016 due to his hospitalization and surgery for left toe osteomyelitis and amputation. He is a prolonged course of antibiotics and recovery. His wound is healing well, off antibiotics now. -We discussed his restaging CT form 03/26/17 which shows cancer progression in liver and lymph nodes. He has gall stones.  -We again discussed with the goal of care is palliative at this stage, not curable. Due to his young age, I strongly encouraged him to restart chemotherapy FOLFIRI, I'll reduce the dose or consider 5-fu alone for first few cycles due to his nutrition and PS  -After lengthy discussion, patient agrees to restart chemotherapy, but he wishes to [name]-pone chemo 3 more week to get his affairs in order before starting again. -I prescribed mirtazapine and dexamethasone for his poor appetite and  fatigue -Start chemo and f/u in 3 weeks    2. Right ureter obstruction and hydronephrosis -This was found on the recent CT scan. His creatinine is stable -This is likely secondary to his new peritoneal metastasis -stable on recent CT   3. Genetics -per NCCN guideline, due to his young age and family history of pancreatic cancer, he is qualified for genetic testing to ruled out inheritable genetic syndromes. -His genetic testing was negative. APC VUS called [id number] found on the colorectal cancer panel.  The Colorectal Cancer Panel offered by [facility] includes sequencing and/or duplication/deletion testing of the following 19 genes: APC, ATM, AXIN2, BMPR1A, CDH1, CHEK2, EPCAM, MLH1, MSH2, MSH6, MUTYH, PMS2, POLD1, POLE, PTEN, SCG5/GREM1, SMAD4, STK11, and TP53.  4.  HTN, DM, CAD, morbid obesity -he will continue follow-up with his primary care physician -We will monitor his blood pressure, glucose closely during the chemotherapy, and may need to adjust his the medication if needed. -I strongly encouraged him to see Dr. Brigitte Pulse to adjust his diabetic medication due to his uncontrolled hyperglycemia and hypertension --his BG again is high, 204 today, he will refill his glipizide, and monitor his BG at home. -We previously discussed his premedication dexamethasone will increase his blood sugar, diabetic diet discussed again. -PCP restarted his metformin and stopped Glucozide. He still takes his Lantus injections. His diabetes overall is better controlled.  5. History of alcohol abuse -He has previously stopped alcohol completely  since he started chemotherapy.   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  -Near resolved now  7. Coping and social issues  -The patient is having a hard time with his diagnosis.  -I previously offered social work to help whenever he is in need of it. He declined at that time.  8. Depression  -he has been depressed previously due to his  cancer progression  -He previously states he has good social support from his progress in the muscle, he denies suicidal idea. He previoulsy declined antidepressant medication -I previously encouraged him to our [employer] [employer] for counseling, he will think about it. -He is agreeable to start mirtazapine now   9. 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 -He will continue follow-up with his podiatrist Dr. Lorin Mercy   10. Goal of care discussion  -We again discussed the incurable nature of his cancer, and the overall poor prognosis, especially if he does not have good response to chemotherapy or progress on chemo -The patient understands the goal of care is palliative. -I previously recommended DNR/DNI, He Agrees with DNR/DNI   11. Low appetite/Weight loss/insomnia  -He uses Trazodone for sleeping but does not work well enough -He has lost 6 pounds since last visit, eats 1-2 meals a day -I suggest marinol, but he declined for now. I recommend him to take Mirtazapine 15 mg to help instead.  -For his low energy, I prescribed Dexamethasone and explained the side effects of long term use, I recommend him to use up to 5-7 days  Plan -Order Mirtazapine 15 mg and Dexamethasone 4 mg daily (for 7 days for his fatigue) today -Cancel 9/17 appointment  -Lab, flush, f/u and chemo FOLFIRI and avatin 10/4    All questions were answered. The patient knows to call the clinic with any problems, questions or concerns.  I spent 25 minutes counseling the patient face to face. The total time spent in the appointment was 30 minutes and more than 50% was on counseling.   Truitt Merle, MD 03/28/2017   This document serves as a record of services personally performed by Truitt Merle, MD. It was created on her behalf by Joslyn Devon, a trained medical scribe. The creation of this record is based on the scribe's personal observations and the  provider's statements to them. This document has been checked and approved by the attending provider.

## 2017-03-27 ENCOUNTER — Other Ambulatory Visit: Payer: Self-pay | Admitting: Hematology

## 2017-03-28 ENCOUNTER — Telehealth: Payer: Self-pay | Admitting: Hematology

## 2017-03-28 ENCOUNTER — Ambulatory Visit (HOSPITAL_BASED_OUTPATIENT_CLINIC_OR_DEPARTMENT_OTHER): Payer: BLUE CROSS/BLUE SHIELD | Admitting: Hematology

## 2017-03-28 ENCOUNTER — Encounter: Payer: Self-pay | Admitting: Hematology

## 2017-03-28 VITALS — BP 138/81 | HR 104 | Temp 98.7°F | Resp 17 | Ht 75.0 in | Wt 338.0 lb

## 2017-03-28 DIAGNOSIS — N1330 Unspecified hydronephrosis: Secondary | ICD-10-CM | POA: Diagnosis not present

## 2017-03-28 DIAGNOSIS — G4700 Insomnia, unspecified: Secondary | ICD-10-CM

## 2017-03-28 DIAGNOSIS — C186 Malignant neoplasm of descending colon: Secondary | ICD-10-CM | POA: Diagnosis not present

## 2017-03-28 DIAGNOSIS — C787 Secondary malignant neoplasm of liver and intrahepatic bile duct: Secondary | ICD-10-CM | POA: Diagnosis not present

## 2017-03-28 DIAGNOSIS — G620 Drug-induced polyneuropathy: Secondary | ICD-10-CM

## 2017-03-28 DIAGNOSIS — F329 Major depressive disorder, single episode, unspecified: Secondary | ICD-10-CM

## 2017-03-28 DIAGNOSIS — E119 Type 2 diabetes mellitus without complications: Secondary | ICD-10-CM | POA: Diagnosis not present

## 2017-03-28 DIAGNOSIS — Z89422 Acquired absence of other left toe(s): Secondary | ICD-10-CM

## 2017-03-28 DIAGNOSIS — M869 Osteomyelitis, unspecified: Secondary | ICD-10-CM

## 2017-03-28 DIAGNOSIS — R634 Abnormal weight loss: Secondary | ICD-10-CM

## 2017-03-28 DIAGNOSIS — C786 Secondary malignant neoplasm of retroperitoneum and peritoneum: Secondary | ICD-10-CM | POA: Diagnosis not present

## 2017-03-28 DIAGNOSIS — N135 Crossing vessel and stricture of ureter without hydronephrosis: Secondary | ICD-10-CM | POA: Diagnosis not present

## 2017-03-28 DIAGNOSIS — R630 Anorexia: Secondary | ICD-10-CM

## 2017-03-28 DIAGNOSIS — I10 Essential (primary) hypertension: Secondary | ICD-10-CM

## 2017-03-28 DIAGNOSIS — E1165 Type 2 diabetes mellitus with hyperglycemia: Secondary | ICD-10-CM

## 2017-03-28 MED ORDER — DEXAMETHASONE 4 MG PO TABS: 4.0000 mg | ORAL_TABLET | Freq: Every day | ORAL | 0 refills | Status: AC

## 2017-03-28 MED ORDER — MIRTAZAPINE 15 MG PO TABS: 15.0000 mg | ORAL_TABLET | Freq: Every day | ORAL | 2 refills | Status: AC

## 2017-03-28 NOTE — Telephone Encounter (Signed)
Patient with patient re new schedule. Appointments for 9/17 cxd and new appointments added for 10/4 and 10/6. Patient given date/time - patient declined print out - he is my chart active.

## 2017-03-31 ENCOUNTER — Ambulatory Visit: Payer: BLUE CROSS/BLUE SHIELD

## 2017-03-31 ENCOUNTER — Other Ambulatory Visit: Payer: BLUE CROSS/BLUE SHIELD

## 2017-04-09 ENCOUNTER — Ambulatory Visit (INDEPENDENT_AMBULATORY_CARE_PROVIDER_SITE_OTHER): Payer: BLUE CROSS/BLUE SHIELD | Admitting: Orthopaedic Surgery

## 2017-04-15 ENCOUNTER — Other Ambulatory Visit: Payer: Self-pay | Admitting: Family Medicine

## 2017-04-15 NOTE — Telephone Encounter (Signed)
Called to pt to get more info on how he is taking medication.  Pt advises he has 1/2 of the insulin left.  He advises if his blood sugars are over 200 he uses 25 units and if blood sugars are under 200 he uses 15 units.  Pt takes insulin in the morning instead of bedtime as prescribed.  Advised would forward request to dr Brigitte Pulse.  Please advise. Dgaddy, CMA

## 2017-04-16 NOTE — Telephone Encounter (Signed)
mychart message sent to pt about making an apt °

## 2017-04-17 ENCOUNTER — Encounter: Payer: BLUE CROSS/BLUE SHIELD | Admitting: Hematology

## 2017-04-17 ENCOUNTER — Other Ambulatory Visit: Payer: BLUE CROSS/BLUE SHIELD

## 2017-04-17 ENCOUNTER — Ambulatory Visit: Payer: BLUE CROSS/BLUE SHIELD

## 2017-04-18 NOTE — Progress Notes (Signed)
No show  This encounter was created in error - please disregard.

## 2017-04-22 ENCOUNTER — Telehealth: Payer: Self-pay | Admitting: Hematology

## 2017-04-22 NOTE — Telephone Encounter (Signed)
Scheduled appt per 10/9 sch message - pateint unable to come in to Friday due to [place] not being in the office - aware of appt date and time scheduled for 10/11- did not scheduled treatment per 10/5 sch message due to patient not wanting treatment - MD aware okay to go ahead and due just lab and f./u this week due to pain.

## 2017-04-24 ENCOUNTER — Encounter: Payer: Self-pay | Admitting: Hematology

## 2017-04-24 ENCOUNTER — Other Ambulatory Visit (HOSPITAL_BASED_OUTPATIENT_CLINIC_OR_DEPARTMENT_OTHER): Payer: BLUE CROSS/BLUE SHIELD

## 2017-04-24 ENCOUNTER — Ambulatory Visit (HOSPITAL_BASED_OUTPATIENT_CLINIC_OR_DEPARTMENT_OTHER): Payer: BLUE CROSS/BLUE SHIELD | Admitting: Hematology

## 2017-04-24 VITALS — BP 108/58 | HR 88 | Temp 97.7°F | Resp 16 | Ht 75.0 in | Wt 347.7 lb

## 2017-04-24 DIAGNOSIS — M869 Osteomyelitis, unspecified: Secondary | ICD-10-CM | POA: Diagnosis not present

## 2017-04-24 DIAGNOSIS — I10 Essential (primary) hypertension: Secondary | ICD-10-CM

## 2017-04-24 DIAGNOSIS — R630 Anorexia: Secondary | ICD-10-CM | POA: Diagnosis not present

## 2017-04-24 DIAGNOSIS — E1165 Type 2 diabetes mellitus with hyperglycemia: Secondary | ICD-10-CM

## 2017-04-24 DIAGNOSIS — G620 Drug-induced polyneuropathy: Secondary | ICD-10-CM | POA: Diagnosis not present

## 2017-04-24 DIAGNOSIS — R634 Abnormal weight loss: Secondary | ICD-10-CM

## 2017-04-24 DIAGNOSIS — C186 Malignant neoplasm of descending colon: Secondary | ICD-10-CM | POA: Diagnosis not present

## 2017-04-24 DIAGNOSIS — E119 Type 2 diabetes mellitus without complications: Secondary | ICD-10-CM

## 2017-04-24 DIAGNOSIS — G4700 Insomnia, unspecified: Secondary | ICD-10-CM

## 2017-04-24 DIAGNOSIS — C787 Secondary malignant neoplasm of liver and intrahepatic bile duct: Secondary | ICD-10-CM | POA: Diagnosis not present

## 2017-04-24 DIAGNOSIS — N135 Crossing vessel and stricture of ureter without hydronephrosis: Secondary | ICD-10-CM

## 2017-04-24 DIAGNOSIS — N1330 Unspecified hydronephrosis: Secondary | ICD-10-CM | POA: Diagnosis not present

## 2017-04-24 DIAGNOSIS — T451X5A Adverse effect of antineoplastic and immunosuppressive drugs, initial encounter: Secondary | ICD-10-CM

## 2017-04-24 DIAGNOSIS — D649 Anemia, unspecified: Secondary | ICD-10-CM

## 2017-04-24 DIAGNOSIS — C786 Secondary malignant neoplasm of retroperitoneum and peritoneum: Secondary | ICD-10-CM

## 2017-04-24 DIAGNOSIS — F329 Major depressive disorder, single episode, unspecified: Secondary | ICD-10-CM

## 2017-04-24 DIAGNOSIS — C19 Malignant neoplasm of rectosigmoid junction: Secondary | ICD-10-CM

## 2017-04-24 DIAGNOSIS — Z89422 Acquired absence of other left toe(s): Secondary | ICD-10-CM

## 2017-04-24 LAB — COMPREHENSIVE METABOLIC PANEL
ALT: 28 U/L (ref 0–55)
AST: 121 U/L — AB (ref 5–34)
Albumin: 2 g/dL — ABNORMAL LOW (ref 3.5–5.0)
Alkaline Phosphatase: 326 U/L — ABNORMAL HIGH (ref 40–150)
Anion Gap: 15 mEq/L — ABNORMAL HIGH (ref 3–11)
BUN: 26 mg/dL (ref 7.0–26.0)
CALCIUM: 10.5 mg/dL — AB (ref 8.4–10.4)
CHLORIDE: 101 meq/L (ref 98–109)
CO2: 19 meq/L — AB (ref 22–29)
CREATININE: 1.3 mg/dL (ref 0.7–1.3)
EGFR: >60 mL/min/{1.73_m2} (ref >60)
Glucose: 131 mg/dl (ref 70–140)
POTASSIUM: 4.9 meq/L (ref 3.5–5.1)
SODIUM: 135 meq/L — AB (ref 136–145)
Total Bilirubin: 1.29 mg/dL — ABNORMAL HIGH (ref 0.20–1.20)
Total Protein: 7.8 g/dL (ref 6.4–8.3)

## 2017-04-24 LAB — CBC WITH DIFFERENTIAL/PLATELET
BASO%: 0.3 % (ref 0.0–2.0)
Basophils Absolute: 0 10e3/uL (ref 0.0–0.1)
EOS%: 0.9 % (ref 0.0–7.0)
Eosinophils Absolute: 0.1 10e3/uL (ref 0.0–0.5)
HCT: 32 % — ABNORMAL LOW (ref 38.4–49.9)
HGB: 10 g/dL — ABNORMAL LOW (ref 13.0–17.1)
LYMPH%: 16.7 % (ref 14.0–49.0)
MCH: 27.1 pg — ABNORMAL LOW (ref 27.2–33.4)
MCHC: 31.2 g/dL — ABNORMAL LOW (ref 32.0–36.0)
MCV: 86.7 fL (ref 79.3–98.0)
MONO#: 1.1 10e3/uL — ABNORMAL HIGH (ref 0.1–0.9)
MONO%: 7 % (ref 0.0–14.0)
NEUT#: 12.1 10e3/uL — ABNORMAL HIGH (ref 1.5–6.5)
NEUT%: 75.1 % — ABNORMAL HIGH (ref 39.0–75.0)
Platelets: 398 10e3/uL (ref 140–400)
RBC: 3.69 10e6/uL — ABNORMAL LOW (ref 4.20–5.82)
RDW: 19.6 % — ABNORMAL HIGH (ref 11.0–14.6)
WBC: 16.1 10e3/uL — ABNORMAL HIGH (ref 4.0–10.3)
lymph#: 2.7 10e3/uL (ref 0.9–3.3)

## 2017-04-24 NOTE — Progress Notes (Signed)
Spoke with [employer] @ [facility] new pt referral.   Informed Amber that Dr. Burr Medico will be hospice attending,  Hospice providers to assist with symptoms management, and activate hospice standing orders.  Gave Amber contact person and phone number of brother Pilar Plate Gwyndolyn Saxon )    [phone].

## 2017-04-24 NOTE — Progress Notes (Addendum)
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   Staging form: Colon and Rectum, AJCC 7th Edition     Pathologic stage from 10/18/2015: Stage IIIA (T2, N1a, cM0) - Signed by Truitt Merle, MD on 11/15/2015       Cancer of left colon ([place])   05/11/2015 Imaging    CT ABD/PELVIS: Gallstones w/sludge;liver prominent without focal liver lesion, mild thickening of bladder wall; negative for cancer      05/18/2015 Imaging    [place] ABD: Negative      07/25/2015 Initial Diagnosis    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 8 mm descending polyp and 5 mm rectosigmoid polyp;18-29 mm proximal rectal polyp      07/25/2015 Tumor Marker    CEA=0.7      07/26/2015 Pathology Results    Sigmoid mass: invasive colorectal adenocarcinoma      10/22/2015 Pathologic Stage    T2, N1, MO    #29 nodes examined w/ 1 postive node; moderately differentiated; Negative for Lymph-vascular and Peri-neural invasion; Microscopic extension into muscularis propria      10/26/2015 Pathology Results    MSI Stable      11/30/2015 - 02/08/2016 Chemotherapy    Adjuvant chemotherapy with Capecitabine 2500 mg twice daily, on  day 1-14, oxaliplatin 130 mg/m, every 21 days      01/19/2016 Genetic Testing    Patient has genetic testing done for young colon cancer. APC [id number] VUS identified on the Colorectal cancer panel.  The Colorectal Cancer Panel offered by [facility] includes sequencing and/or duplication/deletion testing of the following 19 genes: APC, ATM, AXIN2, BMPR1A, CDH1, CHEK2, EPCAM, MLH1, MSH2, MSH6, MUTYH, PMS2, POLD1, POLE, PTEN, SCG5/GREM1, SMAD4, STK11, and TP53.       05/01/2016 Imaging    Surveillance CT CAP scan showed new more focal low-density lesions in the liver, possible metastasis. No other new lesions.      05/01/2016 Progression    Surveillance CT scan showed probable liver metastasis, confirmed by liver mass biopsy       06/05/2016 Imaging    MR ABDOMEN W WO CONTRAST IMPRESSION: 1. 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 2. No additional metastatic disease in the upper abdomen. 3. Cholelithiasis.      06/24/2016 Pathology Results    US Biopsy Diagnosis Liver, needle/core biopsy, right lobe - METASTATIC ADENOCARCINOMA, CONSISTENT WITH COLORECTAL PRIMARY.      07/16/2016 Miscellaneous    Foundation One results received      07/18/2016 - 09/03/2016 Chemotherapy    Chemo CAPOX, every 3 weeks, started on 07/18/2016 and added Avastin, every 3 weeks, on 08/08/16. Oxaplatin held since 09/03/16 due to severe infusion reaction. Patient was to hold Xeloda from 08/09/16 and restart on 09/04/16.  10/02/2016 - 12/11/2016 Chemotherapy    Chemotherapy FOLFIRI and Avastin starting 10/02/16, every 2 weeks. Held after 12/11/16 hospitalization due to diabetic foot and toe amputation.        10/15/2016 Imaging    CT CAP w/ contrast 10/15/16 IMPRESSION: 1. Unfortunately there is worsening of the hepatic metastatic burden with generally enlarging and scattered new hepatic metastatic lesions. 2. New right hydronephrosis, hydroureter, and delayed excretion on the right  indicating high-grade obstruction of the ureter. This obstruction appears to be near the iliac vessel cross over where there is abnormal mesenteric stranding and nodularity likely reflecting some residual tumor causing traction on or possibly even invading the right ureter. This may warrant ureteral stenting or nephrostomy. 3. Other imaging findings of potential clinical significance: Stable calcified pleural nodularity on the right, no change from 2014. Coronary atherosclerosis. Cholelithiasis. Stable mild extrahepatic biliary dilatation. Extensive bridging spurring in the thoracolumbar spine and sacroiliac joints.      10/15/2016 Progression    Restaging CT scan showed worsening liver metastasis, high-grade right ureter obstruction and hydronephrosis, concerning for peritoneal metastasis.      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 Lorin [name], treated with IV abx and PO abx. Pt was evaluated by ID.      03/26/2017 Imaging    CT CAP 03/26/17 IMPRESSION: 1. Moderate disease progression. 2. Progressive hepatic metastasis. 3. Enlarging abdominal nodes, highly suspicious for nodal metastasis. 4. Similar moderate right hydroureteronephrosis secondary to an enlarging lesion either adjacent to or within the mid right ureter. 5. New small volume ascites. 6. New mild right cardiophrenic angle adenopathy is suspicious for nodal metastasis. Otherwise, no evidence of metastatic disease in the chest. 7. Significantly age advanced coronary artery atherosclerosis. 8. Cholelithiasis with equivocal pericholecystic edema. Given history of right upper quadrant pain, consider right upper quadrant ultrasound. 9. Chronic mild common duct dilatation.       HISTORY OF PRESENTING ILLNESS: 11/16/15 Chad [name] 46 y.o.  male is here because of recently diagnosed colon cancer.  He is accompanied by his friend to the clinic today.  He presented with abdominal pain since Oct 2016,  Was seen in the emergency room on 05/11/2015,  CT abdomen and pelvis showed  Mild thickening of the urinary bladder wall, gallstones, otherwise negative. He was seen by her primary care physician Dr. Brigitte [name] afterwards, who referred him to GI Dr. Carlean [name].  His father passed away from [place] cancer around that time,  So his appointment was postponed, and he finally had colonoscopy in early January 2017, which showed multiple polyps, and  A ulcerated mass in distal sigmoid colon.  He was subsequently referred to colorectal surgeon Dr. Johney [name], and  Eventually had left colectomy  On 11/01/2015.  He tolerated surgery well, and recovered well. He has good appetite good, BM is normal most of time, occasional diarrhea, likely second to food, mild low abdomen pain, which has improves, he lost about 30 lbs before surgery and 10lbs after surgery.   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  He is divorced, he has a 31 year old daughter who lives with her mother in [place]. He has sisters and brothers, and her mother who live close by.   CURRENT THERAPY:  Supportive Care, pending hospice   INTERIM HISTORY:  Chad [name] returns for follow-up. He presents to the clinic today with his brother Chad [name]. He did not come to the clinic last week due to him being too weak. His brother reports pt feels severely weak and significant pain in his lower abdomen due to his tumor. He cannot get sleep, nothing more than an hour. Trazodone does not work and Ambien effects his head.  He takes tylenol but tries not to take much. He is on ASA and taking his HTN medication. Dexamethasone he takes but only helped some. He is no longer on pain medication but is now willing to take it now. His is on zofran and  takes1.5 tablets a day and it helps. He tried Mirtazapine for 4 days but would get sick in the middle of the night. He stopped it. He reports compazine does not work for him.  His brother reports pt is getting significant LE edema after his toe amputation. His brother would like to get paperwork started for Hospice, He wants to become his POA. The pt agrees.    MEDICAL HISTORY:  Past Medical History:  Diagnosis Date  . Cancer [facility])    colon cancer  . Coronary artery disease   . Diabetes mellitus without complication ([place]) 10/979   oral agents only (07/2015)  . ETOH abuse 2014  . Fatty liver 12/2014   on ultrasound. pt obese and abuses ETOH  . Gallstone 12/2014   GB stones and sludge on ultrasound and CT,  . GERD (gastroesophageal reflux disease)   . Hypertension   . Morbid obesity with BMI of 50.0-59.9, adult ([place]) 03/2013  . Rectal bleeding 05/2015   07/2015 colonoscopy with malignant appearing sigmoid mass. Additional rectal, descending, polyps  . Shoulder pain, bilateral    due to previous injury    SURGICAL HISTORY: Past Surgical History:  Procedure Laterality Date  . AMPUTATION Left 12/13/2016   Procedure: LEFT FOURTH TOE AMPUTATION;  Surgeon: Marybelle Killings, MD;  Location: [facility];  Service: Orthopedics;  Laterality: Left;  . CARDIAC CATHETERIZATION N/A 04/06/2015   Procedure: Left Heart Cath and Coronary Angiography;  Surgeon: Belva Crome, MD;  Location: [facility];  Service: Cardiovascular;  Laterality: N/A;  . COLONOSCOPY N/A 07/25/2015   Procedure: COLONOSCOPY;  Surgeon: Gatha Mayer, MD;  Location: [facility];  Service: Gastroenterology;  Laterality: N/A;  . CYSTOSCOPY  10/18/2015   Procedure: CYSTOSCOPY FLEXIBLE URETHRAL DIALATION AND FOLEY PLACEMENT;  Surgeon: Alexis Frock, MD;  Location: [facility];  Service: Urology;;  . ESOPHAGOGASTRODUODENOSCOPY N/A 07/25/2015   Procedure: ESOPHAGOGASTRODUODENOSCOPY (EGD);  Surgeon: Gatha Mayer, MD;  Location: [facility];   Service: Gastroenterology;  Laterality: N/A;  . PORTACATH PLACEMENT Right 08/01/2016   Procedure: INSERTION PORT-A-CATH WITH ULTRASOUND AND FLUOROSOPIC GUIDANCE - RIGHT INTERNAL JUGULAR;  Surgeon: Michael Boston, MD;  Location: [facility];  Service: General;  Laterality: Right;    SOCIAL HISTORY: Social History   Social History  . Marital status: Legally Separated    Spouse name: N/A  . Number of children: 1  . Years of education: N/A   Occupational History  . Manager    Social History Main Topics  . Smoking status: Never Smoker  . Smokeless tobacco: Former Systems developer    Types: Chew     Comment: occasionally dips tobacco - has used for approx 20 yrs (12/21/15)  . Alcohol use Yes     Comment: heavy drinking for 10 years, 2-3  drinks of liqour a night,  has been cutting back.    . Drug use: No  . Sexual activity: Not on file   Other Topics Concern  . Not on file   Social History Narrative   Divorced, has 47 yo daughter lives in [place]   Lives alone   Manages chain of car wash dealers       FAMILY HISTORY: Family History  Problem Relation Age of Onset  . Diabetes Father   . Pancreatic cancer Father 46       former smoker, but had not smoked in 57 years  . Cancer Maternal Uncle        d. 23s; unknown type of cancer -metastatic throughout abdomen   . Other Brother        oldest brother has had a normal colonoscopy  . Heart attack Maternal Grandfather        d. 57-58; heavy smoker and drinker  . Heart attack Paternal Grandmother        d. early 48s  . Heart attack Paternal Grandfather 39  . Heart failure Maternal Uncle   . Skin cancer Maternal Uncle 42       NOS type; was out in the sun a lot  . Brain cancer Maternal Uncle        NOS type; mother's maternal half-brother dx. 55-68  . Cirrhosis Paternal Uncle   . Cancer Paternal Uncle        (x2-3) uncles with NOS cancers; d. 36s    ALLERGIES:  has No Known Allergies.  MEDICATIONS:  Current Outpatient Prescriptions  Medication  Sig Dispense Refill  . acetaminophen (TYLENOL) 500 MG tablet Take 1,000 mg by mouth every 6 (six) hours as needed for moderate pain or headache.    Marland Kitchen aspirin EC 81 MG tablet Take 1 tablet (81 mg total) by mouth daily. DO NOT TAKE AGAIN UNTIL 08/09/15    . blood glucose meter kit and supplies KIT Dispense based on patient and insurance preference. Use up to four times daily as directed. (FOR ICD-9 250.00, 250.01). 1 each 0  . carvedilol (COREG CR) 20 MG 24 hr capsule Take 20 mg by mouth daily.    . clotrimazole (LOTRIMIN) 1 % cream Apply topically 2 (two) times daily. 30 g 0  . dexamethasone (DECADRON) 4 MG tablet Take 1 tablet (4 mg total) by mouth daily. 20 tablet 0  . doxycycline (VIBRA-TABS) 100 MG tablet Take 1 tablet (100 mg total) by mouth 2 (two) times daily. 60 tablet 1  . glucose blood test strip One touch strips test bid Dx. dm 200 each 3  . Insulin Glargine (BASAGLAR KWIKPEN) 100 UNIT/ML SOPN INJECT 0.4MLS(40 UNITS TOTAL)INTO THE SKIN AT BEDTIME 15 mL 0  . Insulin Pen Needle (PEN NEEDLES) 32G X 4 MM MISC 1 Device by Does not apply route daily. 100 each 11  . JANUMET 50-1000 MG tablet   2  . lidocaine-prilocaine (EMLA) cream Apply 1 application topically as needed. 30 g 2  . lisinopril-hydrochlorothiazide (PRINZIDE,ZESTORETIC) 20-25 MG tablet TK 1 T PO D  0  . mirtazapine (REMERON) 15 MG tablet Take 1 tablet (15 mg total) by mouth at bedtime. 30 tablet 2  . omeprazole (PRILOSEC) 40 MG capsule TAKE 1 CAPSULE(40 MG) BY MOUTH DAILY 90 capsule 3  . ondansetron (ZOFRAN) 8 MG tablet Take 1 tablet (8 mg total) by mouth every 8 (eight) hours as needed for nausea or vomiting. 30 tablet 2  . oxyCODONE-acetaminophen (PERCOCET/ROXICET) 5-325 MG tablet Take 1 tablet  by mouth every 6 (six) hours as needed for moderate pain. 20 tablet 0  . prochlorperazine (COMPAZINE) 10 MG tablet Take 1 tablet (10 mg total) by mouth every 6 (six) hours as needed for nausea or vomiting. 30 tablet 2  . traZODone  (DESYREL) 50 MG tablet TAKE 1/2 TABLET EVERY MORNING AND 1 AND 1/2 TABLET EVERY NIGHT AT BEDTIME 180 tablet 0   No current facility-administered medications for this visit.    Facility-Administered Medications Ordered in Other Visits  Medication Dose Route Frequency Provider Last Rate Last Dose  . sodium chloride flush (NS) 0.9 % injection 10 mL  10 mL Intracatheter PRN Truitt Merle, MD   10 mL at 10/18/16 1432    REVIEW OF SYSTEMS:  Constitutional: Denies fevers, chills or abnormal night sweats (+) weight loss (+) no appetite (+) severe fatigue (+) imsomnia Eyes: Denies blurriness of vision, double vision or watery eyes Ears, nose, mouth, throat, and face: Denies mucositis or sore throat Respiratory: Denies dyspnea or wheezes.  Cardiovascular: Denies palpitation, chest discomfort (+) significant LE edema of left leg Gastrointestinal:  Denies heartburn (+) Nausea (+) significant abdominal pain Skin: Denies abnormal skin rashes Lymphatics: Denies new lymphadenopathy or easy bruising Neurological:Denies numbness, or new weaknesses Behavioral/Psych: Mood is stable, no new changes  All other systems were reviewed with the patient and are negative.  PHYSICAL EXAMINATION:  ECOG PERFORMANCE STATUS: 3 Vitals:   04/24/17 1516  BP: (!) 108/58  [name]: 88  Resp: 16  Temp: 97.7 F (36.5 C)  TempSrc: Oral  SpO2: 98%  Weight: (!) 347 lb 11.2 oz (157.7 kg)  Height: _0  (1.905 m)     GENERAL:alert,  Morbidly obese. SKIN: skin color, texture, turgor are normal, no rashes or significant lesions EYES: normal, conjunctiva are pink and non-injected, sclera clear OROPHARYNX:no exudate, no erythema and lips, buccal mucosa, and tongue normal  NECK: supple, thyroid normal size, non-tender, without nodularity LYMPH:  no palpable lymphadenopathy in the cervical, axillary or inguinal LUNGS: clear to auscultation and percussion with normal breathing effort HEART: regular rate & rhythm and no murmurs and  no lower extremity edema ABDOMEN: abdomen soft,  Surgical incision and laparoscopic scars are well-healed. non-tender and normal bowel sounds  Musculoskeletal: no cyanosis of digits and no clubbing  PSYCH: alert & oriented x 3 with fluent speech NEURO: no focal motor/sensory deficits  LABORATORY DATA:  I have reviewed the data as listed CBC Latest Ref Rng & Units 04/24/2017 03/19/2017 02/11/2017  WBC 4.0 - 10.3 10e3/uL 16.1(H) 13.1(H) 12.8(H)  Hemoglobin 13.0 - 17.1 g/dL 10.0(L) 10.1(L) 10.6(L)  Hematocrit 38.4 - 49.9 % 32.0(L) 32.8(L) 33.1(L)  Platelets 140 - 400 10e3/uL 398 457(H) 368    CMP Latest Ref Rng & Units 04/24/2017 03/19/2017 02/11/2017  Glucose 70 - 140 mg/dl 131 176(H) 128(H)  BUN 7.0 - 26.0 mg/dL 26.0 12.3 14  Creatinine 0.7 - 1.3 mg/dL 1.3 1.1 1.04  Sodium 136 - 145 mEq/L 135(L) 137 138  Potassium 3.5 - 5.1 mEq/L 4.9 4.1 4.1  Chloride 98 - 110 mmol/L - - 98  CO2 22 - 29 mEq/L 19(L) 25 22  Calcium 8.4 - 10.4 mg/dL 10.5(H) 9.9 9.3  Total Protein 6.4 - 8.3 g/dL 7.8 8.4(H) -  Total Bilirubin 0.20 - 1.20 mg/dL 1.29(H) 0.63 -  Alkaline Phos 40 - 150 U/L 326(H) 266(H) -  AST 5 - 34 U/L 121(H) 41(H) -  ALT 0 - 55 U/L 28 12 -    CEA:  07/25/2015:  0.7 11/30/2015: 4.5 05/01/2016: 57.89 (64.7)  07/18/2016: 329.87 (310.0) 09/03/2016: 589 10/02/2016: 676.76 10/30/2016: 496.12 11/27/16: 1,033.34 03/19/17: 946.39 04/24/17: PENDING    PATHOLOGY REPORT: US Biopsy 06/24/2016 Diagnosis Liver, needle/core biopsy, right lobe - METASTATIC ADENOCARCINOMA, CONSISTENT WITH COLORECTAL PRIMARY. - SEE COMMENT.  Diagnosis 10/18/2015 1. Colon, segmental resection for tumor, rectosigmoid INFILTRATIVE MODERATELY DIFFERENTIATED ADENOCARCINOMA OF THE COLON (3.8 CM) THE TUMOR INVADES MUSCULARIS PROPRIA MARGINS OF RESECTION ARE NEGATIVE FOR TUMOR METASTATIC COLONIC ADENOCARCINOMA IN ONE OF TWENTY-NINE LYMPH NODES (1/29) 2. Colon, resection margin (donut), final distal margin BENIGN COLONIC TISSUE,  NEGATIVE FOR CARCINOMA      Diagnosis 07/25/2015 1. Colon, polyp(s), descending - TUBULAR ADENOMA. NO HIGH GRADE DYSPLASIA OR MALIGNANCY IDENTIFIED. 2. Colon, biopsy, distal sigmoid mass - INVASIVE COLORECTAL ADENOCARCINOMA. 3. Colon, polyp(s), small recto sigmoid - HYPERPLASTIC POLYP. NO ADENOMATOUS CHANGE OR MALIGNANCY. 4. Rectum, polyp(s) - TUBULOVILLOUS ADENOMA. NO HIGH GRADE DYSPLASIA OR MALIGNANCY IDENTIFIED.   RADIOGRAPHIC STUDIES: I have personally reviewed the radiological images as listed and agreed with the findings in the report.   CT CAP 03/26/17 IMPRESSION: 1. Moderate disease progression. 2. Progressive hepatic metastasis. 3. Enlarging abdominal nodes, highly suspicious for nodal metastasis. 4. Similar moderate right hydroureteronephrosis secondary to an enlarging lesion either adjacent to or within the mid right ureter. 5. New small volume ascites. 6. New mild right cardiophrenic angle adenopathy is suspicious for nodal metastasis. Otherwise, no evidence of metastatic disease in the chest. 7. Significantly age advanced coronary artery atherosclerosis. 8. Cholelithiasis with equivocal pericholecystic edema. Given history of right upper quadrant pain, consider right upper quadrant ultrasound. 9. Chronic mild common duct dilatation.  CT CAP w/ contrast 10/15/16 IMPRESSION: 1. Unfortunately there is worsening of the hepatic metastatic burden with generally enlarging and scattered new hepatic metastatic lesions. 2. New right hydronephrosis, hydroureter, and delayed excretion on the right indicating high-grade obstruction of the ureter. This obstruction appears to be near the iliac vessel cross over where there is abnormal mesenteric stranding and nodularity likely reflecting some residual tumor causing traction on or possibly even invading the right ureter. This may warrant ureteral stenting or nephrostomy. 3. Other imaging findings of potential clinical  significance: Stable calcified pleural nodularity on the right, no change from 2014. Coronary atherosclerosis. Cholelithiasis. Stable mild extrahepatic biliary dilatation. Extensive bridging spurring in the thoracolumbar spine and sacroiliac joints.  Abdominal MRI w wo contrast 06/05/2016 IMPRESSION: 1. 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 2. No additional metastatic disease in the upper abdomen. 3. Cholelithiasis.  CT chest, abdomen and pelvis w contrast 05/01/2016 IMPRESSION: 1. Stable chest CT.  No evidence of thoracic metastatic disease. 2. Progressive heterogeneity of the liver could be due to heterogeneous steatosis. However, there are new more focal low-density lesions which are worrisome for possible metastatic disease given the patient's history. Further evaluation with hepatic MRI recommended. 3. Resolving postsurgical retroperitoneal fluid collections status [name] partial colon resection and anastomosis.  COLONOSCOPY 07/25/2015 ENDOSCOPIC IMPRESSION: 1) 1/4 circumference firm ulcerated mass in distal sigmmoid - approx 22 cm from verge. Looks like cancer. Biopsied. 2) 8 mm descending polyp and 5 mm rectosigmoid polyp removed cold snare, completely recovered and sent to path. 3) 18-20 mm pedunculated proximal rectal polyp removed hot snare and completely recovered for pathology. 4) Otherwise normal colonoscopy RECOMMENDATIONS: 1. Hold Aspirin and all other NSAIDS for 2 weeks. 2. Will call pathology results and plans though anticipate surgical referral after pathology in. CEA will be drawn at hospital today. He has had recent CT's  of chest, and abd/pelvis - will see if he needs new ones - he may.  EGD 07/25/2015 ENDOSCOPIC IMPRESSION: Normal appearing esophagus and GE junction, the stomach was well visualized and normal in appearance, normal appearing duodenum  ASSESSMENT & PLAN:  46 y.o.  Caucasian male, with past medical history of heavy  alcohol drinking, hypertension, diabetes, coronary artery disease, presented with abdominal pain.  1.  Cancer of left colon, Distal sigmoid, invasive adenocarcinoma, G2, pT2N1aM0, stage IIIA, MSI-stable, liver metastasis in 04/2016, possible peritoneal mets in 10/2016 -I initially reviewed his CT scan findings, and the surgical pathology findings include details with patient. -he has locally advanced sigmoid colon cancer, with one out of 29 lymph nodes positive. -He has completed 3 months of adjuvant chemotherapy CAPOX, tolerated well. -He unfortunately developed metastatic disease right after he completes adjuvant chemotherapy. MRI confirmed at least a 6 metastatic lesions in the liver, largest 4.2 cm. Giving the multiple liver metastasis, this is likely incurable metastatic cancer. -He underwent ultrasound-guided liver mass biopsy to confirm metastasis, which revealed metastatic adenocarcinoma consistent with a colorectal primary.  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 He is not a candidate for immunotherapy, and would not benefit from EGFR inhibitor. -He was on first-line chemotherapy CAPOX and avastin, unfortunately he developed severe infusion reaction to oxaliplatin on 08/08/16, and oxliplatin has been held since then.  -he started second line chemo FOLFIRI and Avastin on 10/02/16. His chemo has been held since 11/27/2016 due to his hospitalization and surgery for left toe osteomyelitis and amputation. He is a prolonged course of antibiotics and recovery. His wound is healing well, off antibiotics now. -We discussed his restaging CT form 03/26/17 which shows cancer progression in liver and lymph nodes. He has gall stones.  -We again discussed with the goal of care is palliative at this stage, not curable. Due to his young age, I strongly encouraged him to restart chemotherapy FOLFIRI, I'll reduce the dose or consider 5-fu alone for first few cycles due to his nutrition and PS    -After lengthy discussion, patient agrees to restart chemotherapy, but he wishes to [name]-pone chemo 3 more week to get his affairs in order before starting again. -Patient's overall condition his rapidly deteriorated in the past months, his performance status is 3, he is not interested in chemotherapy anymore. He would like to enroll to hospice. Giving his overall poor condition, I agree with hospice. -We discussed the management of his pain, nausea, and anorexia. I prescribed oxycodone, and Marinol today. -He knows he can contact us for any follow up and needed pain medication.    2. Right ureter obstruction and hydronephrosis -This was found on the recent CT scan. His creatinine is stable -This is likely secondary to his new peritoneal metastasis -stable on 03/26/17 CT   3. Genetics -per NCCN guideline, due to his young age and family history of pancreatic cancer, he is qualified for genetic testing to ruled out inheritable genetic syndromes. -His genetic testing was negative. APC VUS called [id number] found on the colorectal cancer panel.  The Colorectal Cancer Panel offered by [facility] includes sequencing and/or duplication/deletion testing of the following 19 genes: APC, ATM, AXIN2, BMPR1A, CDH1, CHEK2, EPCAM, MLH1, MSH2, MSH6, MUTYH, PMS2, POLD1, POLE, PTEN, SCG5/GREM1, SMAD4, STK11, and TP53.  4.  HTN, DM, CAD, morbid obesity  -he will continue follow-up with his primary care physician -We will monitor his blood pressure, glucose closely during the chemotherapy, and may need to adjust his the  medication if needed. -I strongly encouraged him to see Dr. Brigitte [name] to adjust his diabetic medication due to his uncontrolled hyperglycemia and hypertension --his BG again is high, 204 today, he will refill his glipizide, and monitor his BG at home. -We previously discussed his premedication dexamethasone will increase his blood sugar, diabetic diet discussed again. -PCP restarted his metformin and  stopped Glucozide. He still takes his Lantus injections. His diabetes overall is better controlled. -HTN and DM are better controlled, partially related to his decreased by mouth intake, I recommend he hold his HCTZ or lisinopril medications while his BP is low. He will monitor at home.  5. History of alcohol abuse -He has previously stopped alcohol completely since he started chemotherapy.   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  -Near resolved now  7. Coping and social issues  -The patient is having a hard time with his diagnosis.  -I previously offered social work to help whenever he is in need of it. He declined at that time.  8. Depression  -he has been depressed previously due to his cancer progression  -He previously states he has good social support from his progress in the muscle, he denies suicidal idea. He previoulsy declined antidepressant medication -I previously encouraged him to our [employer] for counseling, he will think about it. -He is agreeable to start mirtazapine, prescribed on 03/28/17, could not tolerate, so he stopped.   9. 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 -He will continue follow-up with his podiatrist Dr. Lorin [name]  -Experiences Left LE edema [name] surgery. I suggest compression socks to help minimize this.   10. Goal of care discussion  -We again discussed the incurable nature of his cancer, and the overall poor prognosis, especially if he does not have good response to chemotherapy or progress on chemo -The patient understands the goal of care is palliative. -I previously recommended DNR/DNI, He Agrees with DNR/DNI  -His Brother Chad Saxon "Pilar Plate" Borin is his POA, will complete paperwork  -They would like [place] referral, I will send referral.   11. Low appetite/Weight loss/insomnia  -He uses Trazodone for sleeping but does not  work well enough -He has lost 6 pounds since last visit, eats 1-2 meals a day -I suggest marinol, but he declined for now. I recommend him to take Mirtazapine 15 mg to help instead. He tried for 4 days and he felt sick and stopped taking it.  -For his low energy, I prescribed Dexamethasone and explained the side effects of long term use, I recommend him to use up to 5-7 days. Dexa only help some -I suggest he continue ensure boosts -I will prescribe Ativan, and Marinol for appetite and insomnia. I will refill Zofran. Compazine does not work for him.    Plan -Prescribe Ativan for insomnia, Oxycodone, and Marinol today -refill Zofran today  -Send referral for [place] his brother fill out POA paperwork -I will remain to be his MD when he is under hospice care    All questions were answered. The patient knows to call the clinic with any problems, questions or concerns.  I spent 30 minutes counseling the patient face to face. The total time spent in the appointment was 40 minutes and more than 50% was on counseling.   Truitt Merle, MD 04/24/2017   This document serves as a record of services personally performed by Truitt Merle,  MD. It was created on her behalf by Joslyn Devon, a trained medical scribe. The creation of this record is based on the scribe's personal observations and the provider's statements to them. This document has been checked and approved by the attending provider.

## 2017-04-25 LAB — FERRITIN: Ferritin: 2460 ng/ml — ABNORMAL HIGH (ref 22–316)

## 2017-04-25 LAB — IRON AND TIBC
%SAT: 20 % (ref 20–55)
IRON: 35 ug/dL — AB (ref 42–163)
TIBC: 170 ug/dL — ABNORMAL LOW (ref 202–409)
UIBC: 136 ug/dL (ref 117–376)

## 2017-04-25 LAB — CEA (IN HOUSE-CHCC): CEA (CHCC-In House): 3365.7 ng/mL — ABNORMAL HIGH (ref 0.00–5.00)

## 2017-04-28 ENCOUNTER — Encounter: Payer: Self-pay | Admitting: *Deleted

## 2017-04-28 NOTE — Progress Notes (Signed)
[facility] Clinical Social Worker  Clinical Social Worker received referral from patients brother, Pilar Plate requesting assistance with social security disability.  CSW contacted Pilar Plate at home to offer support and assess for needs.  Pilar Plate stated he and patient were meeting with Hospice tomorrow to complete initial assessment.  Pilar Plate stated patient has not worked in over a month, and would like to [employer].  CSW informed Pilar Plate of the [facility] and he was agreeable to a referral.  CSW completed referral, and servant center will contact patient and family.  CSW provided contact information and encouraged patient or family to call with any additional questions or concerns.    Johnnye Lana, MSW, LCSW, OSW-C Clinical Social Worker [facility] [phone]

## 2017-05-09 ENCOUNTER — Other Ambulatory Visit: Payer: Self-pay

## 2017-05-09 ENCOUNTER — Telehealth: Payer: Self-pay

## 2017-05-09 MED ORDER — OXYCODONE HCL 5 MG PO TABS: ORAL_TABLET | ORAL | 0 refills | Status: DC

## 2017-05-09 MED ORDER — OXYCODONE HCL 5 MG PO TABS: ORAL_TABLET | ORAL | 0 refills | Status: AC

## 2017-05-09 NOTE — Telephone Encounter (Signed)
[facility] called and left message. Needs refill on Oxycodone, takes 1-2 every 4 hours. Urine is a carrot color and cloudy, denies discomfort. Just FYI. Having episodes of heart fluttering once a day.  Heart rate up to 170's for 5 to 10 minutes. Not taking Coreg. Blood pressure 90 over 50's and 40's.

## 2017-05-09 NOTE — Telephone Encounter (Signed)
Below message given to Dr. Burr Medico. Sherril Croon nurse with Hospice back.  Nothing new to add from Dr. Burr Medico. Rx sent to pharmacy.

## 2017-06-14 DEATH — deceased

## 2017-06-18 ENCOUNTER — Other Ambulatory Visit: Payer: Self-pay | Admitting: Nurse Practitioner

## 2017-12-06 ENCOUNTER — Encounter (HOSPITAL_COMMUNITY): Payer: Self-pay | Admitting: Hematology

## 2018-03-25 IMAGING — MR MR ABDOMEN WO/W CM
17 series · 48 of 48 positions shown · IV contrast (20 ML MULTIHANCE)
Comparison: CT 05/01/2016

CLINICAL DATA: Colorectal carcinoma. Indeterminate lesions within
liver on CT.

EXAM:
MRI ABDOMEN WITHOUT AND WITH CONTRAST
TECHNIQUE: Multiplanar multisequence MR imaging of the abdomen was performed
both before and after the administration of intravenous contrast.
CONTRAST:  20mL MULTIHANCE GADOBENATE DIMEGLUMINE 529 MG/ML IV SOLN

[Series 3: T2 · coronal · 5.0mm · 1.76mm/px · 1 of 45 slices shown (1 of 3)]
[im 1/45]
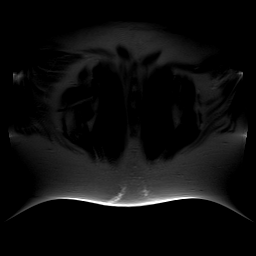

[Series 4: axial tru fisp · axial · 4.0mm · 1.76mm/px · 1 of 50 slices shown]
[im 1/50]
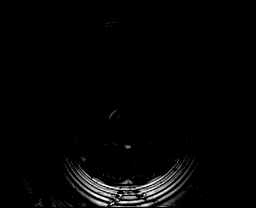

[Series 5: T2 · axial · 5.5mm · 0.88mm/px · 1 of 46 slices shown (2 of 3)]
[im 1/46]
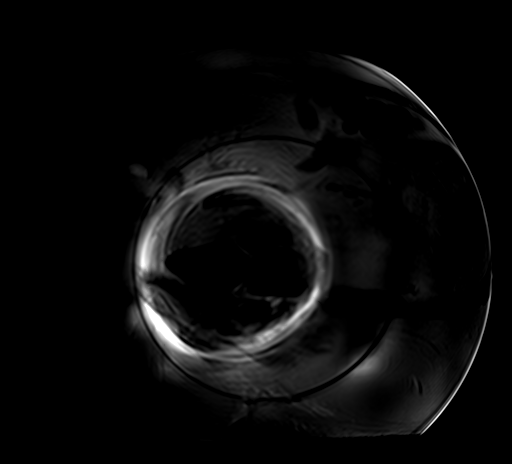

[Series 6: T2 · axial · 4.9mm · 1.76mm/px · 1 of 50 slices shown (3 of 3)]
[im 1/50]
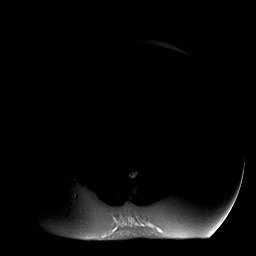

[Series 7: ep2d_[id number]_500_800_p2 · axial · 6.0mm · 2.34mm/px · z∈[-128,+137]mm · 3 of 105 slices shown]
[im 1/105]
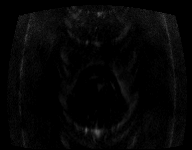
[im 53/105]
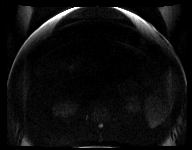
[im 105/105]
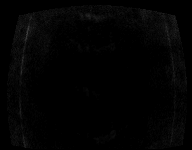

[Series 8: [id number]_adc · axial · 6.0mm · 2.34mm/px · 1 of 35 slices shown]
[im 1/35]
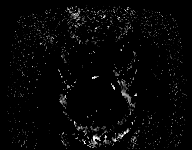

[Series 9: axial in out · axial · 6.0mm · 0.88mm/px · z∈[-141,+108]mm · 2 of 74 slices shown]
[im 1/74]
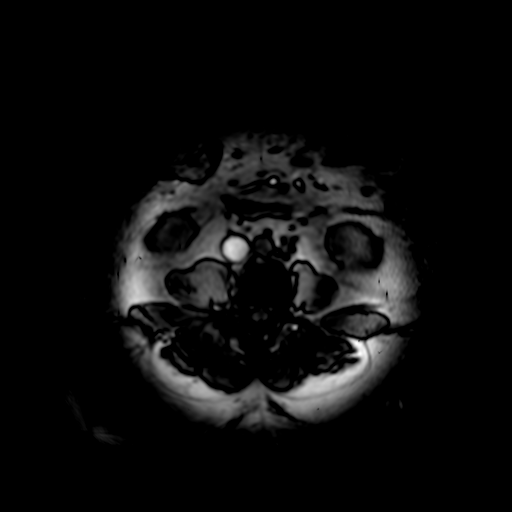
[im 74/74]
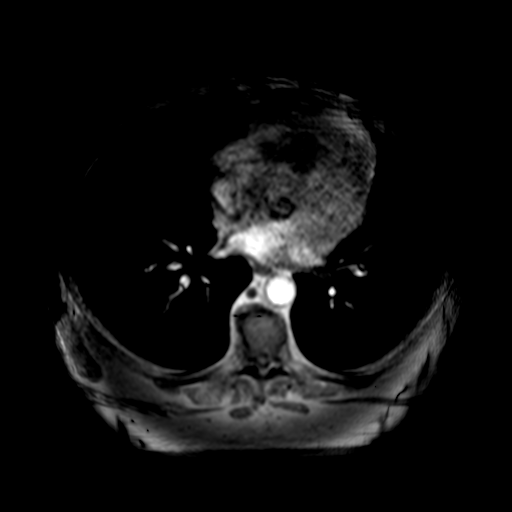

[Series 10: T1 dynamic · axial · non-contrast · 2.0mm · 1.76mm/px · z∈[-136,+102]mm · 4 of 120 slices shown (1 of 2)]
[im 1/120]
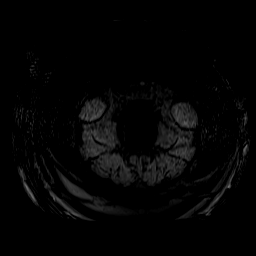
[im 40/120]
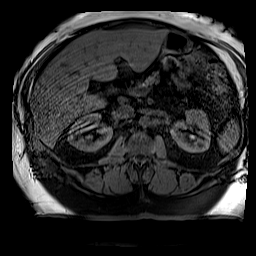
[im 80/120]
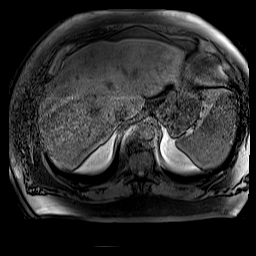
[im 120/120]
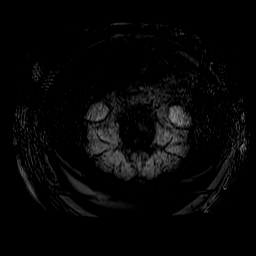

[Series 11: post 25 sec · axial · 2.0mm · 1.76mm/px · z∈[-136,+102]mm · 4 of 120 slices shown]
[im 1/120]
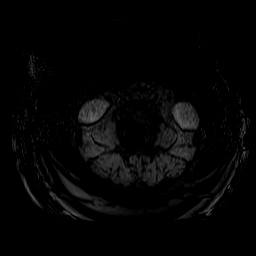
[im 40/120]
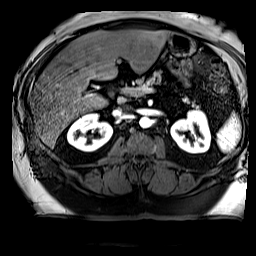
[im 80/120]
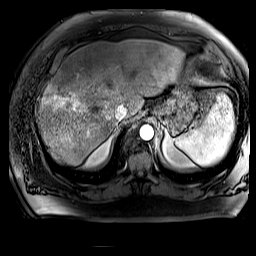
[im 120/120]
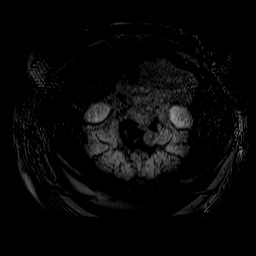

[Series 12: post 25 sec_sub · axial · 2.0mm · 1.76mm/px · z∈[-136,+102]mm · 4 of 120 slices shown]
[im 1/120]
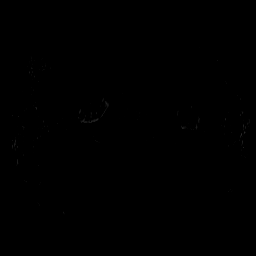
[im 40/120]
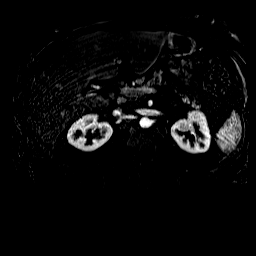
[im 80/120]
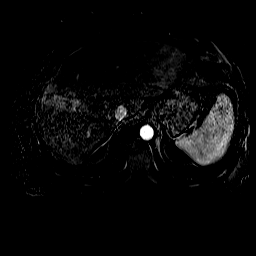
[im 120/120]
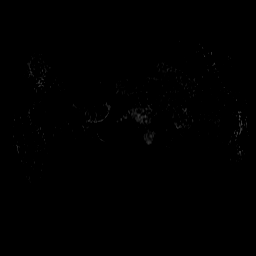

[Series 13: post 45 sec · axial · 2.0mm · 1.76mm/px · z∈[-136,+102]mm · 4 of 120 slices shown]
[im 1/120]
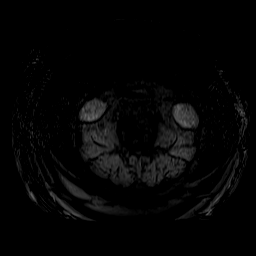
[im 40/120]
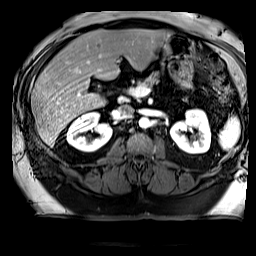
[im 80/120]
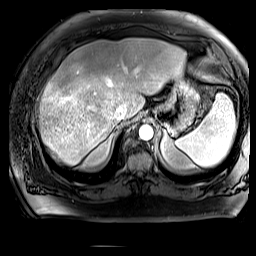
[im 120/120]
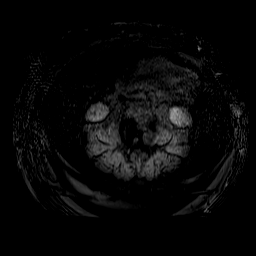

[Series 14: post 45 sec_sub · axial · 2.0mm · 1.76mm/px · z∈[-136,+102]mm · 4 of 120 slices shown]
[im 1/120]
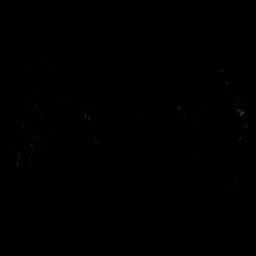
[im 40/120]
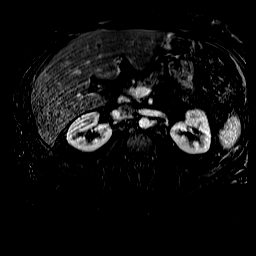
[im 80/120]
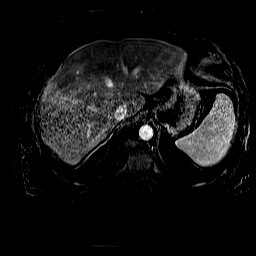
[im 120/120]
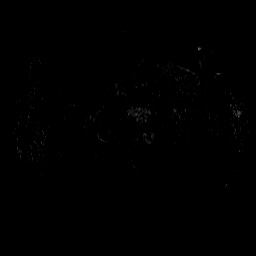

[Series 15: post 90 sec · axial · 2.0mm · 1.76mm/px · z∈[-136,+102]mm · 4 of 120 slices shown]
[im 1/120]
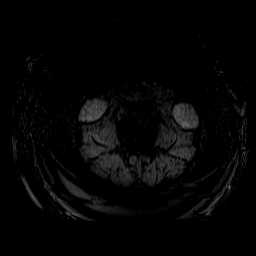
[im 40/120]
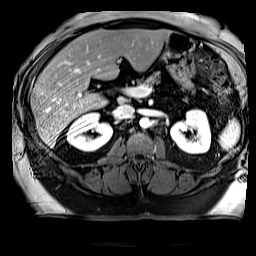
[im 80/120]
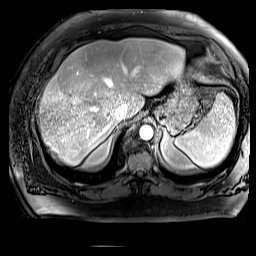
[im 120/120]
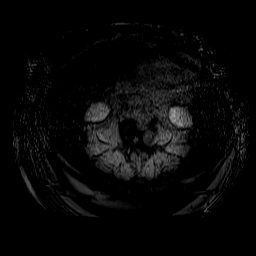

[Series 16: post 90 sec_sub · axial · 2.0mm · 1.76mm/px · z∈[-136,+102]mm · 4 of 120 slices shown]
[im 1/120]
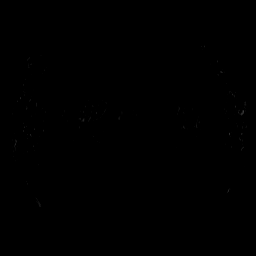
[im 40/120]
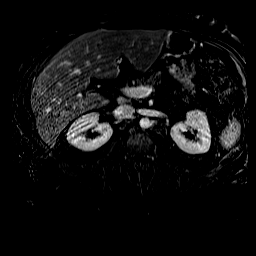
[im 80/120]
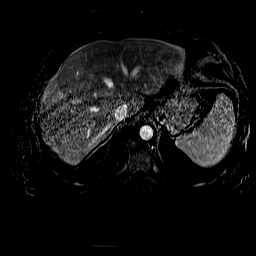
[im 120/120]
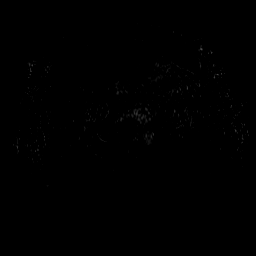

[Series 17: T1 dynamic · coronal · 3.0mm · 1.72mm/px · 2 of 72 slices shown (2 of 2)]
[im 1/72]
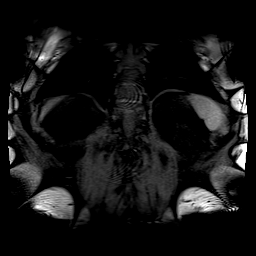
[im 72/72]
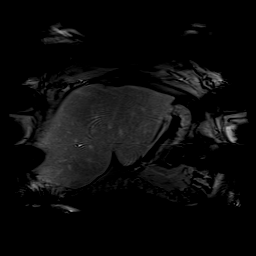

[Series 18: post 3 min · axial · 2.0mm · 1.76mm/px · z∈[-136,+102]mm · 4 of 120 slices shown]
[im 1/120]
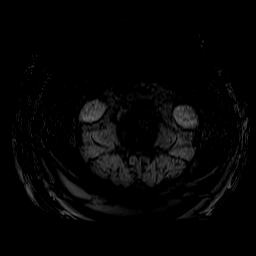
[im 40/120]
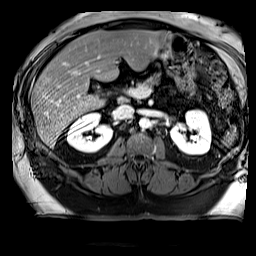
[im 80/120]
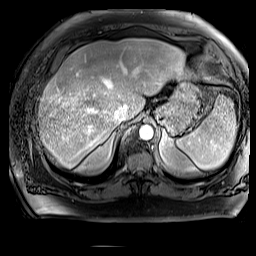
[im 120/120]
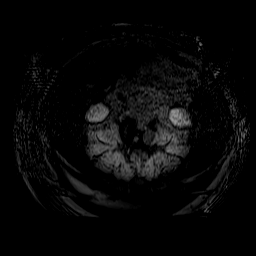

[Series 19: post 3 min_sub · axial · 2.0mm · 1.76mm/px · z∈[-136,+102]mm · 4 of 120 slices shown]
[im 1/120]
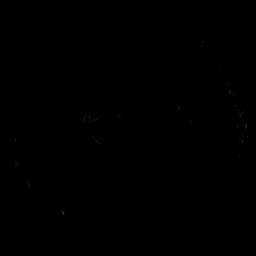
[im 40/120]
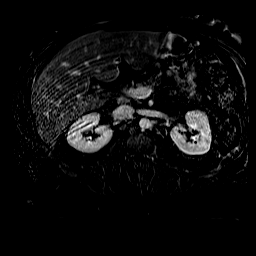
[im 80/120]
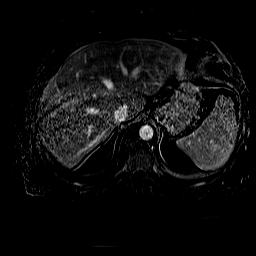
[im 120/120]
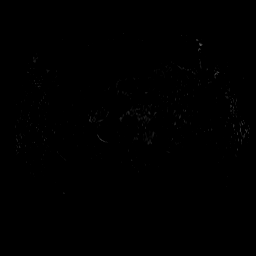

[48 of 48 positions shown; findings below may reference images not displayed]

FINDINGS: Lower chest:  Lung bases are clear.

Hepatobiliary: Several round lesions in the RIGHT hepatic lobe are
hyperintense on T2 weighted imaging. For example 4.2 cm lesion on
image 20, series 5 and 4.1 cm lesion on same image. 2.4 cm lesion on
image 22 within the RIGHT hepatic lobe. Lesion in the superior LEFT
hepatic lobe measures 2.3 cm (image 11). these lesions demonstrate
peripheral enhancement on the post-contrast series and number 6 in
total. Smaller lesion in segment 4 identified on image 84, series
11.

No biliary duct dilatation. Multiple large gallstones colectomy gall
bladder. Common bile duct normal caliber.

Pancreas: Normal pancreatic parenchymal intensity. No ductal
dilatation or inflammation.

Spleen: Normal spleen.

Adrenals/urinary tract: Adrenal glands and kidneys are normal.

Stomach/Bowel: Stomach and limited of the small bowel is
unremarkable

Vascular/Lymphatic: Abdominal aortic normal caliber. No
retroperitoneal periportal lymphadenopathy.

Musculoskeletal: No aggressive osseous lesion
IMPRESSION: 1. Six bilobar enhancing lesions consistent with hepatic metastasis.
2. No additional metastatic disease in the upper abdomen.
3. Cholelithiasis.
